# Patient Record
Sex: Female | Born: 1935 | Hispanic: No | Smoking: Never smoker
Health system: Southern US, Community
[De-identification: ages and names within clinical notes are randomized; demographics above are authoritative.]

## PROBLEM LIST (undated history)

## (undated) DIAGNOSIS — I10 Essential (primary) hypertension: Secondary | ICD-10-CM

## (undated) DIAGNOSIS — L0390 Cellulitis, unspecified: Secondary | ICD-10-CM

## (undated) DIAGNOSIS — R1310 Dysphagia, unspecified: Secondary | ICD-10-CM

## (undated) DIAGNOSIS — G9340 Encephalopathy, unspecified: Secondary | ICD-10-CM

## (undated) DIAGNOSIS — F0280 Dementia in other diseases classified elsewhere without behavioral disturbance: Secondary | ICD-10-CM

## (undated) DIAGNOSIS — D649 Anemia, unspecified: Secondary | ICD-10-CM

## (undated) DIAGNOSIS — G309 Alzheimer's disease, unspecified: Secondary | ICD-10-CM

## (undated) DIAGNOSIS — L03116 Cellulitis of left lower limb: Secondary | ICD-10-CM

## (undated) DIAGNOSIS — I509 Heart failure, unspecified: Secondary | ICD-10-CM

## (undated) DIAGNOSIS — E785 Hyperlipidemia, unspecified: Secondary | ICD-10-CM

## (undated) DIAGNOSIS — E119 Type 2 diabetes mellitus without complications: Secondary | ICD-10-CM

## (undated) DIAGNOSIS — I70209 Unspecified atherosclerosis of native arteries of extremities, unspecified extremity: Secondary | ICD-10-CM

## (undated) DIAGNOSIS — E11649 Type 2 diabetes mellitus with hypoglycemia without coma: Secondary | ICD-10-CM

## (undated) DIAGNOSIS — N390 Urinary tract infection, site not specified: Secondary | ICD-10-CM

## (undated) DIAGNOSIS — L98499 Non-pressure chronic ulcer of skin of other sites with unspecified severity: Secondary | ICD-10-CM

## (undated) DIAGNOSIS — I2510 Atherosclerotic heart disease of native coronary artery without angina pectoris: Secondary | ICD-10-CM

## (undated) DIAGNOSIS — M75102 Unspecified rotator cuff tear or rupture of left shoulder, not specified as traumatic: Secondary | ICD-10-CM

## (undated) DIAGNOSIS — D539 Nutritional anemia, unspecified: Secondary | ICD-10-CM

## (undated) DIAGNOSIS — R569 Unspecified convulsions: Secondary | ICD-10-CM

## (undated) HISTORY — DX: Type 2 diabetes mellitus with hypoglycemia without coma: E11.649

## (undated) HISTORY — DX: Unspecified rotator cuff tear or rupture of left shoulder, not specified as traumatic: M75.102

## (undated) HISTORY — DX: Cellulitis, unspecified: L03.90

## (undated) HISTORY — DX: Unspecified convulsions: R56.9

## (undated) HISTORY — DX: Unspecified atherosclerosis of native arteries of extremities, unspecified extremity: I70.209

## (undated) HISTORY — DX: Non-pressure chronic ulcer of skin of other sites with unspecified severity: L98.499

## (undated) HISTORY — PX: PACEMAKER IMPLANT: EP1218

## (undated) HISTORY — DX: Cellulitis of left lower limb: L03.116

## (undated) HISTORY — DX: Essential (primary) hypertension: I10

## (undated) HISTORY — DX: Alzheimer's disease, unspecified: G30.9

## (undated) HISTORY — DX: Type 2 diabetes mellitus without complications: E11.9

## (undated) HISTORY — DX: Anemia, unspecified: D64.9

## (undated) HISTORY — DX: Hyperlipidemia, unspecified: E78.5

## (undated) HISTORY — DX: Nutritional anemia, unspecified: D53.9

## (undated) HISTORY — DX: Urinary tract infection, site not specified: N39.0

## (undated) HISTORY — DX: Atherosclerotic heart disease of native coronary artery without angina pectoris: I25.10

## (undated) HISTORY — PX: BELOW KNEE LEG AMPUTATION: SUR23

## (undated) HISTORY — DX: Dementia in other diseases classified elsewhere without behavioral disturbance: F02.80

## (undated) HISTORY — DX: Encephalopathy, unspecified: G93.40

---

## 2006-04-22 ENCOUNTER — Inpatient Hospital Stay (HOSPITAL_COMMUNITY): Admission: RE | Admit: 2006-04-22 | Discharge: 2006-04-25 | Payer: Self-pay | Admitting: Orthopedic Surgery

## 2006-04-23 ENCOUNTER — Ambulatory Visit: Payer: Self-pay | Admitting: Physical Medicine & Rehabilitation

## 2014-01-04 DIAGNOSIS — I10 Essential (primary) hypertension: Secondary | ICD-10-CM

## 2014-01-04 DIAGNOSIS — I2510 Atherosclerotic heart disease of native coronary artery without angina pectoris: Secondary | ICD-10-CM

## 2014-01-04 HISTORY — DX: Essential (primary) hypertension: I10

## 2014-01-04 HISTORY — DX: Atherosclerotic heart disease of native coronary artery without angina pectoris: I25.10

## 2014-01-27 DIAGNOSIS — E119 Type 2 diabetes mellitus without complications: Secondary | ICD-10-CM

## 2014-01-27 HISTORY — DX: Type 2 diabetes mellitus without complications: E11.9

## 2014-01-28 DIAGNOSIS — D649 Anemia, unspecified: Secondary | ICD-10-CM

## 2014-01-28 HISTORY — DX: Anemia, unspecified: D64.9

## 2016-09-25 DIAGNOSIS — N390 Urinary tract infection, site not specified: Secondary | ICD-10-CM

## 2016-09-25 HISTORY — DX: Urinary tract infection, site not specified: N39.0

## 2016-09-30 DIAGNOSIS — R569 Unspecified convulsions: Secondary | ICD-10-CM

## 2016-09-30 DIAGNOSIS — G9340 Encephalopathy, unspecified: Secondary | ICD-10-CM

## 2016-09-30 HISTORY — DX: Unspecified convulsions: R56.9

## 2016-09-30 HISTORY — DX: Encephalopathy, unspecified: G93.40

## 2016-10-02 LAB — CBC AND DIFFERENTIAL
HCT: 28 % — AB (ref 33–39)
Hemoglobin: 9.2 g/dL — AB (ref 11.0–13.0)

## 2016-10-02 LAB — BASIC METABOLIC PANEL
BUN: 26 mg/dL — AB (ref 5–18)
CREATININE: 0.5 mg/dL (ref 0.5–1.1)
POTASSIUM: 4.8 mmol/L (ref 3.4–5.3)
Sodium: 146 mmol/L (ref 137–147)

## 2016-10-02 LAB — HEPATIC FUNCTION PANEL
ALT: 11 U/L (ref 3–30)
AST: 13 U/L (ref 2–40)
Alkaline Phosphatase: 73 U/L (ref 25–125)

## 2016-10-03 LAB — HEPATIC FUNCTION PANEL
ALT: 12 U/L (ref 3–30)
AST: 11 U/L (ref 2–40)
Alkaline Phosphatase: 70 U/L (ref 25–125)

## 2016-10-03 LAB — BASIC METABOLIC PANEL
BUN: 29 mg/dL — AB (ref 5–18)
CREATININE: 0.4 mg/dL — AB (ref 0.5–1.1)
POTASSIUM: 4.6 mmol/L (ref 3.4–5.3)
Sodium: 145 mmol/L (ref 137–147)

## 2016-10-03 LAB — CBC AND DIFFERENTIAL
HCT: 28 % — AB (ref 33–39)
Hemoglobin: 9.1 g/dL — AB (ref 11.0–13.0)
PLATELETS: 144 10*3/uL — AB (ref 150–399)
WBC: 7.8 10*3/mL (ref 5.0–15.0)

## 2016-10-04 LAB — CBC AND DIFFERENTIAL
HCT: 26 % — AB (ref 33–39)
Hemoglobin: 8.2 g/dL — AB (ref 11.0–13.0)
PLATELETS: 139 10*3/uL — AB (ref 150–399)
WBC: 6.8 10*3/mL (ref 5.0–15.0)

## 2016-10-04 LAB — BASIC METABOLIC PANEL
BUN: 24 mg/dL — AB (ref 5–18)
CREATININE: 0.4 mg/dL — AB (ref 0.5–1.1)
POTASSIUM: 4.4 mmol/L (ref 3.4–5.3)
Sodium: 143 mmol/L (ref 137–147)

## 2016-10-05 LAB — BASIC METABOLIC PANEL
BUN: 21 mg/dL — AB (ref 5–18)
CREATININE: 0.5 mg/dL (ref 0.5–1.1)
POTASSIUM: 4 mmol/L (ref 3.4–5.3)
SODIUM: 140 mmol/L (ref 137–147)

## 2016-10-05 LAB — CBC AND DIFFERENTIAL
HCT: 26 % — AB (ref 33–39)
Hemoglobin: 8.3 g/dL — AB (ref 11.0–13.0)
Platelets: 143 10*3/uL — AB (ref 150–399)
WBC: 7.3 10^3/mL (ref 5.0–15.0)

## 2016-10-07 DIAGNOSIS — F0280 Dementia in other diseases classified elsewhere without behavioral disturbance: Secondary | ICD-10-CM

## 2016-10-07 HISTORY — DX: Dementia in other diseases classified elsewhere, unspecified severity, without behavioral disturbance, psychotic disturbance, mood disturbance, and anxiety: F02.80

## 2016-10-07 LAB — CBC AND DIFFERENTIAL
HEMATOCRIT: 26 % — AB (ref 33–39)
HEMOGLOBIN: 8.3 g/dL — AB (ref 11.0–13.0)
Platelets: 140 10*3/uL — AB (ref 150–399)
WBC: 6.6 10^3/mL (ref 5.0–15.0)

## 2016-10-07 LAB — BASIC METABOLIC PANEL
BUN: 24 mg/dL — AB (ref 5–18)
CREATININE: 0.6 mg/dL (ref 0.5–1.1)
Potassium: 4.2 mmol/L (ref 3.4–5.3)
Sodium: 144 mmol/L (ref 137–147)

## 2016-10-07 LAB — HEPATIC FUNCTION PANEL
ALT: 20 U/L (ref 3–30)
AST: 16 U/L (ref 2–40)
Alkaline Phosphatase: 91 U/L (ref 25–125)

## 2016-10-09 ENCOUNTER — Encounter: Payer: Self-pay | Admitting: Internal Medicine

## 2016-10-09 ENCOUNTER — Non-Acute Institutional Stay (SKILLED_NURSING_FACILITY): Payer: Medicare Other | Admitting: Internal Medicine

## 2016-10-09 DIAGNOSIS — F32A Depression, unspecified: Secondary | ICD-10-CM | POA: Insufficient documentation

## 2016-10-09 DIAGNOSIS — G309 Alzheimer's disease, unspecified: Secondary | ICD-10-CM

## 2016-10-09 DIAGNOSIS — R5381 Other malaise: Secondary | ICD-10-CM

## 2016-10-09 DIAGNOSIS — J189 Pneumonia, unspecified organism: Secondary | ICD-10-CM | POA: Diagnosis not present

## 2016-10-09 DIAGNOSIS — G40409 Other generalized epilepsy and epileptic syndromes, not intractable, without status epilepticus: Secondary | ICD-10-CM | POA: Diagnosis not present

## 2016-10-09 DIAGNOSIS — D649 Anemia, unspecified: Secondary | ICD-10-CM

## 2016-10-09 DIAGNOSIS — F329 Major depressive disorder, single episode, unspecified: Secondary | ICD-10-CM | POA: Diagnosis not present

## 2016-10-09 DIAGNOSIS — I2510 Atherosclerotic heart disease of native coronary artery without angina pectoris: Secondary | ICD-10-CM | POA: Insufficient documentation

## 2016-10-09 DIAGNOSIS — I739 Peripheral vascular disease, unspecified: Secondary | ICD-10-CM

## 2016-10-09 DIAGNOSIS — R1310 Dysphagia, unspecified: Secondary | ICD-10-CM | POA: Insufficient documentation

## 2016-10-09 DIAGNOSIS — I10 Essential (primary) hypertension: Secondary | ICD-10-CM | POA: Diagnosis not present

## 2016-10-09 DIAGNOSIS — F0280 Dementia in other diseases classified elsewhere without behavioral disturbance: Secondary | ICD-10-CM | POA: Insufficient documentation

## 2016-10-09 DIAGNOSIS — E440 Moderate protein-calorie malnutrition: Secondary | ICD-10-CM | POA: Diagnosis not present

## 2016-10-09 DIAGNOSIS — E1151 Type 2 diabetes mellitus with diabetic peripheral angiopathy without gangrene: Secondary | ICD-10-CM

## 2016-10-09 DIAGNOSIS — E039 Hypothyroidism, unspecified: Secondary | ICD-10-CM | POA: Diagnosis not present

## 2016-10-09 DIAGNOSIS — R1312 Dysphagia, oropharyngeal phase: Secondary | ICD-10-CM | POA: Diagnosis not present

## 2016-10-09 DIAGNOSIS — F0390 Unspecified dementia without behavioral disturbance: Secondary | ICD-10-CM

## 2016-10-09 DIAGNOSIS — E785 Hyperlipidemia, unspecified: Secondary | ICD-10-CM

## 2016-10-09 NOTE — Progress Notes (Signed)
LOCATION: [place]  PCP: No primary care provider on file.   Code Status: Full Code  Goals of care: Advanced Directive information No flowsheet data found.     No emergency contact information on file.   Allergies  Allergen Reactions  . Codeine Nausea Only  . Dilaudid [Hydromorphone]   . Hydrocodone     Hallucinations  . Keflex [Cephalexin] Diarrhea  . Tylenol [Acetaminophen]     Chief Complaint  Patient presents with  . New Admit To SNF    New Admission Visit      HPI:  Patient is a 83 y.o. female seen today for short term rehabilitation post hospital admission from 09/25/2016-10/08/2016 with acute change of mental status. She was noted to have tonic-clonic seizure and required IV benzodiazepine. She also required intubation to help protect her airway. She required loading dose of Keppra followed by oral Keppra dosing. CT of her head and lumbar puncture with CSF study was unremarkable. EEG was abnormal. There was concern for ventilator-dependent respiratory failure versus pneumonia and she was placed on IV antibiotic. She required 2 unit of packed red blood cell transfusion for her anemia. Urine study was negative for infection. She was followed by speech therapy for her dysphagia. She has medical history of dementia, diabetes mellitus, hypertension, coronary artery disease among others. She was residing at a skilled facility prior to this hospitalization. She is seen in her room today with her sister at bedside.  Review of Systems: Limited HPI/ROS Constitutional: Negative for fever HENT: Negative for headache, nasal discharge Respiratory: Negative for cough, shortness of breath Cardiovascular: Negative for chest pain  Gastrointestinal: Negative for nausea, vomiting, abdominal pain. Per sister had a good breakfast this am Genitourinary: Negative for dysuria Musculoskeletal: Negative for back pain, fall.  Skin: Negative for itching, rash.  Neurological: Negative  for dizziness.    Past Medical History:  Diagnosis Date  . Acute encephalopathy   . Alzheimer's dementia without behavioral disturbance   . Anemia   . Atherosclerotic PVD with ulceration ([place])   . CAD (coronary artery disease)   . Cellulitis   . Cellulitis of left leg   . Diabetes mellitus without complication ([place])   . Hyperlipidemia   . Hypertension   . Hypoglycemia associated with type 2 diabetes mellitus ([place])   . Macrocytic anemia   . Tear of left rotator cuff   . UTI (urinary tract infection)    History reviewed. No pertinent surgical history. Social History:   has no tobacco, alcohol, and drug history on file.  History reviewed. No pertinent family history.  Medications: Allergies as of 10/09/2016      Reactions   Codeine Nausea Only   Dilaudid [hydromorphone]    Hydrocodone    Hallucinations   Keflex [cephalexin] Diarrhea   Tylenol [acetaminophen]       Medication List       Accurate as of 10/09/16 12:31 PM. Always use your most recent med list.          carvedilol 12.5 MG tablet Commonly known as:  COREG Take 12.5 mg by mouth 2 (two) times daily with a meal.   cholecalciferol 1000 units tablet Commonly known as:  VITAMIN D Take 2,000 Units by mouth daily.   clopidogrel 75 MG tablet Commonly known as:  PLAVIX Take 75 mg by mouth daily.   glipiZIDE 5 MG tablet Commonly known as:  GLUCOTROL Take 5 mg by mouth 2 (two) times daily before a meal.  levETIRAcetam 500 MG tablet Commonly known as:  KEPPRA Take 500 mg by mouth 2 (two) times daily.   levothyroxine 100 MCG tablet Commonly known as:  SYNTHROID, LEVOTHROID Take 100 mcg by mouth daily before breakfast.   lisinopril 2.5 MG tablet Commonly known as:  PRINIVIL,ZESTRIL Take 2.5 mg by mouth daily.   sertraline 50 MG tablet Commonly known as:  ZOLOFT Take 50 mg by mouth daily.   simvastatin 10 MG tablet Commonly known as:  ZOCOR Take 10 mg by mouth at bedtime.        Immunizations:  There is no immunization history on file for this patient.   Physical Exam: Vitals:   10/09/16 1140  BP: (!) 121/52  Pulse: 75  Resp: 20  Temp: 99 F (37.2 C)  TempSrc: Oral  SpO2: 98%  Weight: 132 lb (59.9 kg)  Height: 5\' 4"  (1.626 m)   Body mass index is 22.66 kg/m.  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 No results for input(s): AMMONIA in the last 8760 hours. CBC:  Recent Labs  10/04/16 10/05/16 10/07/16  WBC 6.8 7.3 6.6  HGB 8.2* 8.3* 8.3*  HCT 26* 26* 26*  PLT 139* 143* 140*    Radiological Exams: Xr Chest Ap Portable 09/30/16 Impression. Supportable apparatus stable. Patchy bilateral infiltrate/edema.  FL Esophagus Modified Barium Swallow 10/04/16 Impression. There was penetration with the nectar and thin consistencies. There was aspiration with nectar and thin  consistencies.  Ct Head Wo IV Contrast 10/02/16 Impression. No acute intracranial abnormality.  US Venous Upper Extremity Bilateral 09/30/16 Impression. No evidence of deep venous thrombosis      Assessment/Plan  Physical deconditioning Will have her work with physical therapy and occupational therapy team to help with gait training and muscle strengthening exercises.fall precautions. Skin care. Encourage to be out of bed. If patient fails to make improvement, will consider palliative care consult.  Tonic clonic seizure Remains seizure free. Continue keppra 500 mg bid and monitor. Make neurology follow up.  Pneumonia Has completed her antibiotic,remains high aspiration risk. Will need assistance with feeding. SLP consult. Encourage to use incentive spirometer  Protein calorie malnutrition Decline anticipated with her dementia. Monitor oral intake and weight. RD consult  Hypothyroidism Continue levothyroxine 100 g daily. Check thyroid level  Type 2 diabetes mellitus with vascular complications Continue glipizide 5 mg daily and lisinopril 2.5 mg daily. Check blood sugar reading and hemoglobin A1c.  Hypertension Monitor BP reading,  continue lisinopril 2.5 mg daily and coreg 12.5 mg bid check bmp  PVD s/p right BKA and has chronic skin changes to left leg. Provide skin care. Continue plavix 75 mg daily and simvastatin 10 mg daily.  Dementia Provide supportive care. Fall precautions. Pressure ulcer prophylaxis  Chronic depression Will get psych consult. Continue zoloft 50 mg daily  Hyperlipidemia Check lipid panel, continue statin  Anemia unspecified Monitor cbc, likely has component of chronic disease  Coronary artery disease Remains chest pain-free. Continue Coreg, Plavix and statin along with lisinopril.  Oropharyngeal dysphagia On dysphagia diet at present. Aspiration precautions to be taken. Assistance with feeding to be provided. SLP consult.    Goals of  care: short term rehabilitation, possible long term care   Labs/tests ordered: cbc, cmp, a1c, tsh, lipid panel, psych consult, SLP consult, RD consult  Family/ staff Communication: reviewed care plan with patient, her sister and nursing supervisor  I have spent greater than 50 minutes for this encounter which includes reviewing hospital records, addressing above mentioned concerns, reviewing care plan with patient, answering patient's and family's concerns and counseling her.    Blanchie Serve, MD Internal Medicine [facility] [address] [place], [place] [address] Cell Phone (Monday-Friday 8 am - 5 pm): [phone] On Call: [phone] and follow prompts after 5 pm and on weekends Office Phone: [phone] Office Fax: [phone]

## 2016-10-09 NOTE — Patient Instructions (Signed)
Opened in error

## 2016-10-09 NOTE — Progress Notes (Signed)
Opened in error

## 2016-10-10 LAB — BASIC METABOLIC PANEL
BUN: 22 mg/dL — AB (ref 4–21)
CREATININE: 0.5 mg/dL (ref 0.5–1.1)
GLUCOSE: 99 mg/dL
POTASSIUM: 3.8 mmol/L (ref 3.4–5.3)
Sodium: 146 mmol/L (ref 137–147)

## 2016-10-15 LAB — BASIC METABOLIC PANEL
BUN: 15 mg/dL (ref 4–21)
CREATININE: 0.6 mg/dL (ref 0.5–1.1)
Glucose: 65 mg/dL
POTASSIUM: 3.8 mmol/L (ref 3.4–5.3)
Sodium: 143 mmol/L (ref 137–147)

## 2016-10-15 LAB — CBC AND DIFFERENTIAL
HCT: 24 % — AB (ref 36–46)
Hemoglobin: 7.6 g/dL — AB (ref 12.0–16.0)
Platelets: 144 10*3/uL — AB (ref 150–399)
WBC: 6.1 10^3/mL

## 2016-10-15 LAB — LIPID PANEL
CHOLESTEROL: 105 mg/dL (ref 0–200)
HDL: 20 mg/dL — AB (ref 35–70)
LDL CALC: 70 mg/dL
LDL/HDL RATIO: 5.2
TRIGLYCERIDES: 75 mg/dL (ref 40–160)

## 2016-10-15 LAB — HEMOGLOBIN A1C: HEMOGLOBIN A1C: 6.2

## 2016-10-15 LAB — HEPATIC FUNCTION PANEL
ALT: 20 U/L (ref 7–35)
AST: 18 U/L (ref 13–35)
Alkaline Phosphatase: 90 U/L (ref 25–125)
BILIRUBIN, TOTAL: 0.6 mg/dL

## 2016-10-15 LAB — TSH: TSH: 1.7 u[IU]/mL (ref 0.41–5.90)

## 2016-10-21 ENCOUNTER — Encounter: Payer: Self-pay | Admitting: Adult Health

## 2016-10-21 NOTE — Progress Notes (Signed)
This encounter was created in error - please disregard.

## 2016-10-22 ENCOUNTER — Encounter: Payer: Self-pay | Admitting: Adult Health

## 2016-10-22 ENCOUNTER — Non-Acute Institutional Stay (SKILLED_NURSING_FACILITY): Payer: Medicare Other | Admitting: Adult Health

## 2016-10-22 DIAGNOSIS — I2510 Atherosclerotic heart disease of native coronary artery without angina pectoris: Secondary | ICD-10-CM

## 2016-10-22 DIAGNOSIS — I739 Peripheral vascular disease, unspecified: Secondary | ICD-10-CM

## 2016-10-22 DIAGNOSIS — G40409 Other generalized epilepsy and epileptic syndromes, not intractable, without status epilepticus: Secondary | ICD-10-CM | POA: Diagnosis not present

## 2016-10-22 DIAGNOSIS — D638 Anemia in other chronic diseases classified elsewhere: Secondary | ICD-10-CM

## 2016-10-22 LAB — CBC AND DIFFERENTIAL
HEMATOCRIT: 23 % — AB (ref 36–46)
Hemoglobin: 7.4 g/dL — AB (ref 12.0–16.0)
NEUTROS ABS: 4 /uL
Platelets: 123 10*3/uL — AB (ref 150–399)
WBC: 6.8 10^3/mL

## 2016-10-22 NOTE — Progress Notes (Signed)
DATE:  10/22/2016   MRN:  [id number]  BIRTHDAY: 09-21-1933  Facility:  [facility] Home Location:  [facility] Room Number: 1101-B  LEVEL OF CARE:  SNF (31)  Contact Information    Name Relation Home Work [place]  [phone]         Code Status History    This patient does not have a recorded code status. Please follow your organizational policy for patients in this situation.       Chief Complaint  Patient presents with  . Acute Visit    Anemia    HISTORY OF PRESENT ILLNESS:  This is an 82-YO female seen for an acute visit due to anemia. Patient complains of headache at times. Latest hgb 7.4.   She has been admitted to [facility] on 10/08/16 post hospitalization with admission dates  09/25/16 to 10/08/16 with acute change in .mental status. She was noted to have tonic-clonic seizure and required IV benzodiazepine. She required intubation to protect her airway. She was started on Keppra. CT of head and lumbar puncture with CSF study was unremarkable. EEG was abnormal. She was placed on antibiotic for concern of ventilator dependent respiratory failure versus PNA. She was transfused 2 units packed RBC for anemia.  She has PMH of dementgia, diabetes mellitus, hypertension and CAD.    PAST MEDICAL HISTORY:  Past Medical History:  Diagnosis Date  . Acute encephalopathy 09/30/2016  . Alzheimer's dementia without behavioral disturbance 10/07/2016  . Anemia 01/28/2014  . CAD (coronary artery disease) 01/04/2014  . DM (diabetes mellitus) ([place]) 01/27/2014  . HTN (hypertension) 01/04/2014  . Seizures ([place]) 09/30/2016  . UTI (urinary tract infection) 09/25/2016     CURRENT MEDICATIONS: Reviewed  Patient's Medications  New Prescriptions   No medications on file  Previous Medications   CARVEDILOL (COREG) 12.5 MG TABLET    Take 12.5 mg by mouth 2 (two) times daily with a meal.   CHOLECALCIFEROL (VITAMIN D3)  2000 UNITS CAPSULE    Take 2,000 Units by mouth daily.   CLOPIDOGREL (PLAVIX) 75 MG TABLET    Take 75 mg by mouth daily.   FERROUS SULFATE 325 (65 FE) MG TABLET    Take 325 mg by mouth 2 (two) times daily with a meal.   LEVETIRACETAM (KEPPRA) 500 MG TABLET    Take 500 mg by mouth 2 (two) times daily.   LEVOTHYROXINE (SYNTHROID, LEVOTHROID) 100 MCG TABLET    Take 100 mcg by mouth daily before breakfast.   LISINOPRIL (PRINIVIL,ZESTRIL) 2.5 MG TABLET    Take 2.5 mg by mouth daily.   NUTRITIONAL SUPPLEMENTS (NUTRITIONAL SUPPLEMENT PO)    Take 1 each by mouth 2 (two) times daily. Magic Cup   OMEPRAZOLE (PRILOSEC) 20 MG CAPSULE    Take 20 mg by mouth daily.   POLYETHYLENE GLYCOL (MIRALAX / GLYCOLAX) PACKET    Take 17 g by mouth daily.   SENNOSIDES-DOCUSATE SODIUM (SENOKOT-S) 8.6-50 MG TABLET    Take 2 tablets by mouth 2 (two) times daily.   SERTRALINE (ZOLOFT) 50 MG TABLET    Take 50 mg by mouth daily.   SIMVASTATIN (ZOCOR) 10 MG TABLET    Take 10 mg by mouth daily.  Modified Medications   No medications on file  Discontinued Medications   No medications on file     Allergies  Allergen Reactions  . Acetaminophen   . Codeine   . Dilaudid [Hydromorphone Hcl]   .  Hydrocodone   . Keflex [Cephalexin]      REVIEW OF SYSTEMS:  Unable to obtain due to dementia    PHYSICAL EXAMINATION  GENERAL APPEARANCE: Well nourished. In no acute distress.  SKIN:  Skin is warm and dry.  HEAD: Normal in size and contour. No evidence of trauma EYES: Lids open and close normally. No blepharitis, entropion or ectropion. PERRL. Conjunctivae are clear and sclerae are white. Lenses are without opacity EARS: Pinnae are normal. Patient hears normal voice tunes of the examiner MOUTH and THROAT: Lips are without lesions. Oral mucosa is moist and without lesions. Tongue is normal in shape, size, and color and without lesions NECK: supple, trachea midline, no neck masses, no thyroid tenderness, no  thyromegaly LYMPHATICS: no LAN in the neck, no supraclavicular LAN RESPIRATORY: breathing is even & unlabored, BS CTAB CARDIAC: RRR, no murmur,no extra heart sounds, no edema GI: abdomen soft, normal BS, no masses, no tenderness, no hepatomegaly, no splenomegaly EXTREMITIES:  Able to move X 4 extremities, Right BKA stump with dressing PSYCHIATRIC: Alert to self only. Affect and behavior are appropriate   LABS/RADIOLOGY: Labs reviewed: Basic Metabolic Panel:  Recent Labs  10/15/16  NA 143  K 3.8  BUN 15  CREATININE 0.6   Liver Function Tests:  Recent Labs  10/15/16  AST 18  ALT 20  ALKPHOS 90   CBC:  Recent Labs  10/22/16  WBC 6.8  NEUTROABS 4  HGB 7.4*  HCT 23*  PLT 123*   Lipid Panel:  Recent Labs  10/15/16  HDL 20*    ASSESSMENT/PLAN:  Anemia of chronic disease - schedule for type and cross then transfuse 1 unit PRBC, check for stool occult blood X 3, start Prilosec 20 mg PO Q D; check VS Q shift X 1 week, B12 and folate level Lab Results  Component Value Date   HGB 7.4 (A) 10/22/2016   CAD - no chest pain; continue Plavix 75 mg 1 tab PO Q D and Zocor 10 mg Q HS  PVD - S/P right BKA, continue Plavix 75 mg Q D and Zocor 10 mg Q HS  Tonic-clonic Seizure - continue Keppra 500 mg 1 tab PO BID; fall precautions      Duyen Beckom C. [place] - NP    [employer] [phone]

## 2016-11-01 ENCOUNTER — Encounter: Payer: Self-pay | Admitting: Adult Health

## 2016-11-01 ENCOUNTER — Non-Acute Institutional Stay (SKILLED_NURSING_FACILITY): Payer: Medicare Other | Admitting: Adult Health

## 2016-11-01 DIAGNOSIS — I10 Essential (primary) hypertension: Secondary | ICD-10-CM | POA: Diagnosis not present

## 2016-11-01 DIAGNOSIS — I739 Peripheral vascular disease, unspecified: Secondary | ICD-10-CM

## 2016-11-01 DIAGNOSIS — R531 Weakness: Secondary | ICD-10-CM | POA: Diagnosis not present

## 2016-11-01 DIAGNOSIS — F329 Major depressive disorder, single episode, unspecified: Secondary | ICD-10-CM

## 2016-11-01 DIAGNOSIS — E785 Hyperlipidemia, unspecified: Secondary | ICD-10-CM | POA: Diagnosis not present

## 2016-11-01 DIAGNOSIS — K5901 Slow transit constipation: Secondary | ICD-10-CM

## 2016-11-01 DIAGNOSIS — I2510 Atherosclerotic heart disease of native coronary artery without angina pectoris: Secondary | ICD-10-CM

## 2016-11-01 DIAGNOSIS — F0390 Unspecified dementia without behavioral disturbance: Secondary | ICD-10-CM

## 2016-11-01 DIAGNOSIS — D638 Anemia in other chronic diseases classified elsewhere: Secondary | ICD-10-CM

## 2016-11-01 DIAGNOSIS — F32A Depression, unspecified: Secondary | ICD-10-CM

## 2016-11-01 DIAGNOSIS — G40409 Other generalized epilepsy and epileptic syndromes, not intractable, without status epilepticus: Secondary | ICD-10-CM | POA: Diagnosis not present

## 2016-11-01 DIAGNOSIS — E039 Hypothyroidism, unspecified: Secondary | ICD-10-CM | POA: Diagnosis not present

## 2016-11-01 NOTE — Progress Notes (Signed)
Patient ID: Jean Hodges, female   DOB: 1933/10/03, 82 y.o.   MRN: [id number]    DATE:  11/01/2016  MRN:  [id number]  BIRTHDAY: 06-Oct-1933  Facility:  Nursing Home Location:  [place] Room Number: 1101-B  LEVEL OF CARE:  SNF (31)  Contact Information    Name Relation Home Work [place]  [phone]         Code Status History    This patient does not have a recorded code status. Please follow your organizational policy for patients in this situation.       Chief Complaint  Patient presents with  . Medical Management of Chronic Issues    HISTORY OF PRESENT ILLNESS:  This is an 82-YO female seen for a routine visit. She is being scheduled for blood transfusion of 1 unit packed RBC for anemia. She was recently started on FeSO4 325 mg BID for anemia and Senna and Miralax for constipation. Glucotrol was discontinued with hgbA1c 6.2 . She was recently discharged from PT services.  She has been admitted to [facility] on 10/08/16 post hospitalization with admission dates  09/25/16 to 10/08/16 with acute change in .mental status. She was noted to have tonic-clonic seizure and required IV benzodiazepine. She required intubation to protect her airway. She was started on Keppra. CT of head and lumbar puncture with CSF study was unremarkable. EEG was abnormal. She was placed on antibiotic for concern of ventilator dependent respiratory failure versus PNA. She was transfused 2 units packed RBC for anemia.  She has PMH of dementgia, diabetes mellitus, hypertension and CAD.    PAST MEDICAL HISTORY:  Past Medical History:  Diagnosis Date  . Acute encephalopathy   . Acute encephalopathy 09/30/2016  . Alzheimer's dementia without behavioral disturbance   . Alzheimer's dementia without behavioral disturbance 10/07/2016  . Anemia   . Anemia 01/28/2014  . Atherosclerotic PVD with ulceration ([place])   . CAD (coronary artery  disease)   . CAD (coronary artery disease) 01/04/2014  . Cellulitis   . Cellulitis of left leg   . Diabetes mellitus without complication ([place])   . DM (diabetes mellitus) ([place]) 01/27/2014  . HTN (hypertension) 01/04/2014  . Hyperlipidemia   . Hypertension   . Hypoglycemia associated with type 2 diabetes mellitus ([place])   . Macrocytic anemia   . Seizures ([place]) 09/30/2016  . Tear of left rotator cuff   . UTI (urinary tract infection)   . UTI (urinary tract infection) 09/25/2016     CURRENT MEDICATIONS: Reviewed  Patient's Medications  New Prescriptions   No medications on file  Previous Medications   CARVEDILOL (COREG) 12.5 MG TABLET    Take 12.5 mg by mouth 2 (two) times daily with a meal.   CHOLECALCIFEROL (VITAMIN D) 1000 UNITS TABLET    Take 2,000 Units by mouth daily.   CLOPIDOGREL (PLAVIX) 75 MG TABLET    Take 75 mg by mouth daily.    FERROUS SULFATE 325 (65 FE) MG TABLET    Take 325 mg by mouth 2 (two) times daily with a meal.   LEVETIRACETAM (KEPPRA) 500 MG TABLET    Take 500 mg by mouth 2 (two) times daily.   LEVOTHYROXINE (SYNTHROID, LEVOTHROID) 100 MCG TABLET    Take 100 mcg by mouth daily before breakfast.   LISINOPRIL (PRINIVIL,ZESTRIL) 2.5 MG TABLET    Take 2.5 mg by mouth daily.   NUTRITIONAL SUPPLEMENTS (NUTRITIONAL SUPPLEMENT PO)  Take 1 each by mouth 2 (two) times daily. Magic Cup   OMEPRAZOLE (PRILOSEC) 20 MG CAPSULE    Take 20 mg by mouth daily.   POLYETHYLENE GLYCOL (MIRALAX / GLYCOLAX) PACKET    Take 17 g by mouth daily.   SENNOSIDES-DOCUSATE SODIUM (SENOKOT-S) 8.6-50 MG TABLET    Take 2 tablets by mouth 2 (two) times daily.   SERTRALINE (ZOLOFT) 100 MG TABLET    Take 100 mg by mouth daily.   SIMVASTATIN (ZOCOR) 10 MG TABLET    Take 10 mg by mouth daily.  Modified Medications   No medications on file  Discontinued Medications   CARVEDILOL (COREG) 12.5 MG TABLET    Take 12.5 mg by mouth 2 (two) times daily with a meal.   CHOLECALCIFEROL (VITAMIN D3) 2000  UNITS CAPSULE    Take 2,000 Units by mouth daily.   CLOPIDOGREL (PLAVIX) 75 MG TABLET    Take 75 mg by mouth daily.   GLIPIZIDE (GLUCOTROL) 5 MG TABLET    Take 5 mg by mouth 2 (two) times daily before a meal.   LEVETIRACETAM (KEPPRA) 500 MG TABLET    Take 500 mg by mouth 2 (two) times daily.   LEVOTHYROXINE (SYNTHROID, LEVOTHROID) 100 MCG TABLET    Take 100 mcg by mouth daily before breakfast.   LISINOPRIL (PRINIVIL,ZESTRIL) 2.5 MG TABLET    Take 2.5 mg by mouth daily.   SERTRALINE (ZOLOFT) 50 MG TABLET    Take 50 mg by mouth daily.   SERTRALINE (ZOLOFT) 50 MG TABLET    Take 50 mg by mouth daily.   SIMVASTATIN (ZOCOR) 10 MG TABLET    Take 10 mg by mouth at bedtime.     Allergies  Allergen Reactions  . Acetaminophen   . Codeine Nausea Only  . Dilaudid [Hydromorphone]   . Hydrocodone     Hallucinations  . Keflex [Cephalexin] Diarrhea     REVIEW OF SYSTEMS:  Unable to obtain due to dementia   PHYSICAL EXAMINATION  GENERAL APPEARANCE: Well nourished. In no acute distress.  SKIN:  Right forearm with skin tears X 2 .  HEAD: Normal in size and contour. No evidence of trauma EYES: Lids open and close normally. No blepharitis, entropion or ectropion. PERRL. Conjunctivae are clear and sclerae are white. Lenses are without opacity EARS: Pinnae are normal. Patient hears normal voice tunes of the examiner MOUTH and THROAT: Lips are without lesions. Oral mucosa is moist and without lesions. Tongue is normal in shape, size, and color and without lesions NECK: supple, trachea midline, no neck masses, no thyroid tenderness, no thyromegaly LYMPHATICS: no LAN in the neck, no supraclavicular LAN RESPIRATORY: breathing is even & unlabored, BS CTAB CARDIAC: RRR, no murmur,no extra heart sounds, no edema GI: abdomen soft, normal BS, no masses, no tenderness, no hepatomegaly, no splenomegaly EXTREMITIES:  Able to move X 4 extremities, Right BKA stump with dressing PSYCHIATRIC: Alert to self only.  Affect and behavior are appropriate   LABS/RADIOLOGY: Labs reviewed: Basic Metabolic Panel:  Recent Labs  10/07/16 10/10/16 10/15/16  NA 144 146 143  K 4.2 3.8 3.8  BUN 24* 22* 15  CREATININE 0.6 0.5 0.6   Liver Function Tests:  Recent Labs  10/03/16 10/07/16 10/15/16  AST 11 16 18   ALT 12 20 20   ALKPHOS 70 91 90   CBC:  Recent Labs  10/07/16 10/15/16 10/22/16  WBC 6.6 6.1 6.8  NEUTROABS  --   --  4  HGB 8.3* 7.6* 7.4*  HCT 26* 24* 23*  PLT 140* 144* 123*   Lipid Panel:  Recent Labs  10/15/16  HDL 20*    ASSESSMENT/PLAN:  Generalized weakness - continue rehabilitation with OT for strengthening exercises; fall precautions  PVD - S/P right BKA, continue Plavix 75 mg Q D and Zocor 10 mg Q HS  Anemia of chronic disease - for blood transfusion of 1 unit PRBC, continue FeSO4 325 mg 1 tab PO BID Lab Results  Component Value Date   HGB 7.4 (A) 10/22/2016   CAD - no chest pain; continue Plavix 75 mg 1 tab PO Q D and Zocor 10 mg Q HS  Tonic-clonic Seizure - continue Keppra 500 mg 1 tab PO BID; fall precautions  Constipation - continue Miralax 17 gm PO Q D  Hypothyroidism - continue Synthroid 100 mcg 1 tab PO Q D Lab Results  Component Value Date   TSH 1.70 10/15/2016   Hypertension - well-controlled; continue Lisinopril 2.5 mg 1 tab PO Q D and Coreg 12.5 mg 1 tab PO BID  Chronic Depression - continue Zoloft 100 mg 1 tab PO Q D  Hyperlipidemia - continue Zoloft 100 mg 1 tab PO Q D  Dementia without behavioral disturbance - continue supportive care, fall precautions    Goals of care:  Long-term care    Brynden Thune C. [place] - NP    [employer] [phone]

## 2016-11-26 ENCOUNTER — Non-Acute Institutional Stay (SKILLED_NURSING_FACILITY): Payer: Medicare Other | Admitting: Internal Medicine

## 2016-11-26 DIAGNOSIS — G40409 Other generalized epilepsy and epileptic syndromes, not intractable, without status epilepticus: Secondary | ICD-10-CM | POA: Diagnosis not present

## 2016-11-26 DIAGNOSIS — E611 Iron deficiency: Secondary | ICD-10-CM

## 2016-11-26 DIAGNOSIS — F329 Major depressive disorder, single episode, unspecified: Secondary | ICD-10-CM

## 2016-11-26 DIAGNOSIS — I739 Peripheral vascular disease, unspecified: Secondary | ICD-10-CM | POA: Diagnosis not present

## 2016-11-26 DIAGNOSIS — F32A Depression, unspecified: Secondary | ICD-10-CM

## 2016-11-26 DIAGNOSIS — E039 Hypothyroidism, unspecified: Secondary | ICD-10-CM

## 2016-11-26 DIAGNOSIS — I10 Essential (primary) hypertension: Secondary | ICD-10-CM

## 2016-11-26 DIAGNOSIS — F0390 Unspecified dementia without behavioral disturbance: Secondary | ICD-10-CM | POA: Diagnosis not present

## 2016-11-26 DIAGNOSIS — I2510 Atherosclerotic heart disease of native coronary artery without angina pectoris: Secondary | ICD-10-CM | POA: Diagnosis not present

## 2016-11-26 DIAGNOSIS — E1151 Type 2 diabetes mellitus with diabetic peripheral angiopathy without gangrene: Secondary | ICD-10-CM | POA: Diagnosis not present

## 2016-11-26 DIAGNOSIS — E785 Hyperlipidemia, unspecified: Secondary | ICD-10-CM | POA: Diagnosis not present

## 2016-11-26 DIAGNOSIS — K219 Gastro-esophageal reflux disease without esophagitis: Secondary | ICD-10-CM | POA: Diagnosis not present

## 2016-11-26 NOTE — Progress Notes (Signed)
LOCATION: [facility]  PCP: No primary care provider on file.   Code Status: Full Code  Goals of care: Advanced Directive information No flowsheet data found.     Extended Emergency Contact Information Primary Emergency Contact: Amirault,RICHARD Address: [address] [place],  [place] Phone: [phone] Relation: None   Allergies  Allergen Reactions  . Acetaminophen   . Codeine Nausea Only  . Dilaudid [Hydromorphone]   . Hydrocodone     Hallucinations  . Keflex [Cephalexin] Diarrhea    Chief Complaint  Patient presents with  . Medical Management of Chronic Issues    Routine Visit      HPI:  Patient is a 83 y.o. female seen today for routine visit. She is pleasantly confused, can carry out conversation and has been at her baseline per nursing. She is seen in her room today. She denies any concerns.  She has medical history of dementia, diabetes mellitus, hypertension, coronary artery disease among others.   Review of Systems:  Constitutional: Negative for fever HENT: Negative for headache, nasal discharge Respiratory: Negative for cough, shortness of breath Cardiovascular: Negative for chest pain  Gastrointestinal: Negative for nausea, vomiting, abdominal pain. She had a bowel movement yesterday.  Genitourinary: Negative for dysuria Musculoskeletal: Negative for back pain Skin: Negative for itching, rash.  Neurological: Negative for dizziness.    Past Medical History:  Diagnosis Date  . Acute encephalopathy   . Acute encephalopathy 09/30/2016  . Alzheimer's dementia without behavioral disturbance   . Alzheimer's dementia without behavioral disturbance 10/07/2016  . Anemia   . Anemia 01/28/2014  . Atherosclerotic PVD with ulceration ([place])   . CAD (coronary artery disease)   . CAD (coronary artery disease) 01/04/2014  . Cellulitis   . Cellulitis of left leg   . Diabetes mellitus without complication ([place])   . DM (diabetes  mellitus) ([place]) 01/27/2014  . HTN (hypertension) 01/04/2014  . Hyperlipidemia   . Hypertension   . Hypoglycemia associated with type 2 diabetes mellitus ([place])   . Macrocytic anemia   . Seizures ([place]) 09/30/2016  . Tear of left rotator cuff   . UTI (urinary tract infection)   . UTI (urinary tract infection) 09/25/2016   No past surgical history on file. Social History:   has no tobacco, alcohol, and drug history on file.  No family history on file.  Medications: Allergies as of 11/26/2016      Reactions   Acetaminophen    Codeine Nausea Only   Dilaudid [hydromorphone]    Hydrocodone    Hallucinations   Keflex [cephalexin] Diarrhea      Medication List       Accurate as of 11/26/16  3:40 PM. Always use your most recent med list.          carvedilol 12.5 MG tablet Commonly known as:  COREG Take 12.5 mg by mouth 2 (two) times daily with a meal.   cholecalciferol 1000 units tablet Commonly known as:  VITAMIN D Take 2,000 Units by mouth daily.   clopidogrel 75 MG tablet Commonly known as:  PLAVIX Take 75 mg by mouth daily.   ferrous sulfate 325 (65 FE) MG tablet Take 325 mg by mouth 2 (two) times daily with a meal.   levETIRAcetam 500 MG tablet Commonly known as:  KEPPRA Take 500 mg by mouth 2 (two) times daily.   levothyroxine 100 MCG tablet Commonly known as:  SYNTHROID, LEVOTHROID Take  100 mcg by mouth daily before breakfast.   lisinopril 2.5 MG tablet Commonly known as:  PRINIVIL,ZESTRIL Take 2.5 mg by mouth daily.   NUTRITIONAL SUPPLEMENT PO Take 1 each by mouth 2 (two) times daily. Magic Cup   omeprazole 20 MG capsule Commonly known as:  PRILOSEC Take 20 mg by mouth daily.   polyethylene glycol packet Commonly known as:  MIRALAX / GLYCOLAX Take 17 g by mouth daily.   sennosides-docusate sodium 8.6-50 MG tablet Commonly known as:  SENOKOT-S Take 2 tablets by mouth 2 (two) times daily.   sertraline 100 MG tablet Commonly known as:   ZOLOFT Take 100 mg by mouth daily.   simvastatin 10 MG tablet Commonly known as:  ZOCOR Take 10 mg by mouth daily.       Immunizations: Immunization History  Administered Date(s) Administered  . PPD Test 10/08/2016     Physical Exam: Vitals:   11/26/16 1532  BP: (!) 112/50  Pulse: 81  Resp: 20  Temp: 97.9 F (36.6 C)  TempSrc: Oral  SpO2: 99%  Weight: 118 lb (53.5 kg)  Height: 5\' 6"  (1.676 m)   Body mass index is 19.05 kg/m.  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 No results for input(s): AMMONIA in the last 8760 hours. CBC:  Recent Labs  10/07/16 10/15/16 10/22/16  WBC 6.6 6.1 6.8  NEUTROABS  --   --  4  HGB 8.3* 7.6* 7.4*  HCT 26* 24* 23*  PLT 140* 144* 123*    Radiological Exams: Xr Chest Ap Portable 09/30/16 Impression. Supportable apparatus stable. Patchy bilateral infiltrate/edema.  FL Esophagus Modified Barium Swallow 10/04/16 Impression. There  was penetration with the nectar and thin consistencies. There was aspiration with nectar and thin consistencies.  Ct Head Wo IV Contrast 10/02/16 Impression. No acute intracranial abnormality.  US Venous Upper Extremity Bilateral 09/30/16 Impression. No evidence of deep venous thrombosis      Assessment/Plan  Iron def anemia Normal Z60 and folic acid. Normal MCV. Continue ferrous sulfate 325 mg bid. Check cbc  Tonic clonic seizure Remains seizure free. Continue keppra 500 mg bid and monitor.   gerd Continue prilosec 20 mg daily  Hypothyroidism Lab Results  Component Value Date   TSH 1.70 10/15/2016   Continue levothyroxine 100 g daily. Check thyroid level  Chronic depression Followed by pscyh and zoloft increased a month back from 50 mg daily to 100 mg daily, monitor mood  PVD s/p right BKA and has chronic skin changes to left  leg. Provide skin care. Continue plavix 75 mg daily and simvastatin 10 mg daily.  Dementia Provide supportive care. Fall precautions. Pressure ulcer prophylaxis  Chronic depression Will get psych consult. Continue zoloft 50 mg daily  Hyperlipidemia Lipid Panel     Component Value Date/Time   CHOL 105 10/15/2016   TRIG 75 10/15/2016   HDL 20 (A) 10/15/2016   LDLCALC 70 10/15/2016   LDL at goal. Continue simvastatin 10 mg qhs and monitor.  HTN Low BP reading on review with SBP 100-116 and DBP 42-67. Currently on lisinopril 2.5 mg daily and coreg 12.5 mg bid. Discontinue her lisinopril. Decrease coreg to 6.25 mg bid. Add holding parameter with coreg and check BP.   Coronary artery disease Remains chest pain-free. Continue Coreg with decreased dosing as above, Plavix and statin. D/c lisinopril as above.  Type 2 diabetes mellitus with vascular complications Lab Results  Component Value Date   HGBA1C 6.2 10/15/2016   a1c suggestive of controlled DM. Off oral hypoglycemics. D/c lisinopril with low BP     Blanchie Serve, MD Internal  Medicine [facility] [address] [place], [place] [address] Cell Phone (Monday-Friday 8 am - 5 pm): [phone] On Call: [phone] and follow prompts after 5 pm and on weekends Office Phone: [phone] Office Fax: [phone]

## 2016-11-27 ENCOUNTER — Encounter (HOSPITAL_COMMUNITY): Payer: Self-pay | Admitting: Emergency Medicine

## 2016-11-27 ENCOUNTER — Observation Stay (HOSPITAL_COMMUNITY)
Admission: EM | Admit: 2016-11-27 | Discharge: 2016-11-28 | Disposition: A | Discharge status: Skilled Nursing Facility | Payer: Medicare Other | Attending: Internal Medicine | Admitting: Internal Medicine

## 2016-11-27 DIAGNOSIS — I739 Peripheral vascular disease, unspecified: Secondary | ICD-10-CM | POA: Diagnosis present

## 2016-11-27 DIAGNOSIS — D649 Anemia, unspecified: Secondary | ICD-10-CM | POA: Diagnosis present

## 2016-11-27 DIAGNOSIS — Z79899 Other long term (current) drug therapy: Secondary | ICD-10-CM | POA: Insufficient documentation

## 2016-11-27 DIAGNOSIS — Z95810 Presence of automatic (implantable) cardiac defibrillator: Secondary | ICD-10-CM | POA: Diagnosis not present

## 2016-11-27 DIAGNOSIS — E1151 Type 2 diabetes mellitus with diabetic peripheral angiopathy without gangrene: Secondary | ICD-10-CM | POA: Diagnosis present

## 2016-11-27 DIAGNOSIS — I2510 Atherosclerotic heart disease of native coronary artery without angina pectoris: Secondary | ICD-10-CM | POA: Diagnosis not present

## 2016-11-27 DIAGNOSIS — Z7902 Long term (current) use of antithrombotics/antiplatelets: Secondary | ICD-10-CM | POA: Diagnosis not present

## 2016-11-27 DIAGNOSIS — R569 Unspecified convulsions: Secondary | ICD-10-CM | POA: Diagnosis not present

## 2016-11-27 DIAGNOSIS — Z89511 Acquired absence of right leg below knee: Secondary | ICD-10-CM | POA: Insufficient documentation

## 2016-11-27 DIAGNOSIS — D509 Iron deficiency anemia, unspecified: Secondary | ICD-10-CM | POA: Diagnosis not present

## 2016-11-27 DIAGNOSIS — F0280 Dementia in other diseases classified elsewhere without behavioral disturbance: Secondary | ICD-10-CM | POA: Diagnosis not present

## 2016-11-27 DIAGNOSIS — I429 Cardiomyopathy, unspecified: Secondary | ICD-10-CM | POA: Diagnosis not present

## 2016-11-27 DIAGNOSIS — G309 Alzheimer's disease, unspecified: Secondary | ICD-10-CM

## 2016-11-27 DIAGNOSIS — F329 Major depressive disorder, single episode, unspecified: Secondary | ICD-10-CM | POA: Diagnosis not present

## 2016-11-27 DIAGNOSIS — R718 Other abnormality of red blood cells: Secondary | ICD-10-CM | POA: Diagnosis present

## 2016-11-27 DIAGNOSIS — I10 Essential (primary) hypertension: Secondary | ICD-10-CM | POA: Diagnosis present

## 2016-11-27 DIAGNOSIS — E039 Hypothyroidism, unspecified: Secondary | ICD-10-CM | POA: Diagnosis not present

## 2016-11-27 DIAGNOSIS — G40409 Other generalized epilepsy and epileptic syndromes, not intractable, without status epilepticus: Secondary | ICD-10-CM | POA: Diagnosis present

## 2016-11-27 LAB — RETICULOCYTES
RBC.: 2.06 MIL/uL — AB (ref 3.87–5.11)
Retic Count, Absolute: 33 10*3/uL (ref 19.0–186.0)
Retic Ct Pct: 1.6 % (ref 0.4–3.1)

## 2016-11-27 LAB — CBC WITH DIFFERENTIAL/PLATELET
BASOS ABS: 0 10*3/uL (ref 0.0–0.1)
BASOS PCT: 0 %
EOS ABS: 0.2 10*3/uL (ref 0.0–0.7)
Eosinophils Relative: 3 %
HEMATOCRIT: 23.4 % — AB (ref 36.0–46.0)
Hemoglobin: 7.9 g/dL — ABNORMAL LOW (ref 12.0–15.0)
Lymphocytes Relative: 17 %
Lymphs Abs: 1.3 10*3/uL (ref 0.7–4.0)
MCH: 34.5 pg — ABNORMAL HIGH (ref 26.0–34.0)
MCHC: 33.8 g/dL (ref 30.0–36.0)
MCV: 102.2 fL — ABNORMAL HIGH (ref 78.0–100.0)
MONO ABS: 0.3 10*3/uL (ref 0.1–1.0)
Monocytes Relative: 5 %
NEUTROS ABS: 5.6 10*3/uL (ref 1.7–7.7)
Neutrophils Relative %: 75 %
PLATELETS: 161 10*3/uL (ref 150–400)
RBC: 2.29 MIL/uL — ABNORMAL LOW (ref 3.87–5.11)
RDW: 18.2 % — AB (ref 11.5–15.5)
WBC: 7.5 10*3/uL (ref 4.0–10.5)

## 2016-11-27 LAB — I-STAT CHEM 8, ED
BUN: 17 mg/dL (ref 6–20)
CALCIUM ION: 1.22 mmol/L (ref 1.15–1.40)
CHLORIDE: 102 mmol/L (ref 101–111)
Creatinine, Ser: 0.6 mg/dL (ref 0.44–1.00)
GLUCOSE: 144 mg/dL — AB (ref 65–99)
HCT: 19 % — ABNORMAL LOW (ref 36.0–46.0)
Hemoglobin: 6.5 g/dL — CL (ref 12.0–15.0)
POTASSIUM: 4.2 mmol/L (ref 3.5–5.1)
Sodium: 138 mmol/L (ref 135–145)
TCO2: 25 mmol/L (ref 0–100)

## 2016-11-27 LAB — BASIC METABOLIC PANEL
ANION GAP: 6 (ref 5–15)
BUN: 17 mg/dL (ref 6–20)
CALCIUM: 8.6 mg/dL — AB (ref 8.9–10.3)
CO2: 26 mmol/L (ref 22–32)
CREATININE: 0.69 mg/dL (ref 0.44–1.00)
Chloride: 104 mmol/L (ref 101–111)
GLUCOSE: 154 mg/dL — AB (ref 65–99)
Potassium: 4.2 mmol/L (ref 3.5–5.1)
Sodium: 136 mmol/L (ref 135–145)

## 2016-11-27 LAB — POC OCCULT BLOOD, ED: FECAL OCCULT BLD: NEGATIVE

## 2016-11-27 LAB — PREPARE RBC (CROSSMATCH)

## 2016-11-27 MED ORDER — POLYETHYLENE GLYCOL 3350 17 G PO PACK
17.0000 g | PACK | Freq: Every day | ORAL | Status: DC
Start: 2016-11-28 — End: 2016-11-28
  Administered 2016-11-28: 17 g via ORAL
  Filled 2016-11-27: qty 1

## 2016-11-27 MED ORDER — SERTRALINE HCL 100 MG PO TABS
100.0000 mg | ORAL_TABLET | Freq: Every day | ORAL | Status: DC
  Administered 2016-11-28: 100 mg via ORAL
  Filled 2016-11-27: qty 1

## 2016-11-27 MED ORDER — FERROUS SULFATE 325 (65 FE) MG PO TABS
325.0000 mg | ORAL_TABLET | Freq: Two times a day (BID) | ORAL | Status: DC
  Administered 2016-11-28 (×2): 325 mg via ORAL
  Filled 2016-11-27 (×2): qty 1

## 2016-11-27 MED ORDER — INSULIN ASPART 100 UNIT/ML ~~LOC~~ SOLN
0.0000 [IU] | Freq: Three times a day (TID) | SUBCUTANEOUS | Status: DC
  Administered 2016-11-28: 2 [IU] via SUBCUTANEOUS

## 2016-11-27 MED ORDER — SIMVASTATIN 10 MG PO TABS
10.0000 mg | ORAL_TABLET | Freq: Every day | ORAL | Status: DC
  Administered 2016-11-28: 10 mg via ORAL
  Filled 2016-11-27: qty 1

## 2016-11-27 MED ORDER — PANTOPRAZOLE SODIUM 40 MG PO TBEC
40.0000 mg | DELAYED_RELEASE_TABLET | Freq: Every day | ORAL | Status: DC
  Administered 2016-11-28: 40 mg via ORAL
  Filled 2016-11-27: qty 1

## 2016-11-27 MED ORDER — SENNOSIDES-DOCUSATE SODIUM 8.6-50 MG PO TABS
2.0000 | ORAL_TABLET | Freq: Two times a day (BID) | ORAL | Status: DC
  Administered 2016-11-27 – 2016-11-28 (×2): 2 via ORAL
  Filled 2016-11-27 (×2): qty 2

## 2016-11-27 MED ORDER — LEVETIRACETAM 500 MG PO TABS
500.0000 mg | ORAL_TABLET | Freq: Two times a day (BID) | ORAL | Status: DC
  Administered 2016-11-27 – 2016-11-28 (×2): 500 mg via ORAL
  Filled 2016-11-27 (×2): qty 1

## 2016-11-27 MED ORDER — VITAMIN D3 25 MCG (1000 UNIT) PO TABS
2000.0000 [IU] | ORAL_TABLET | Freq: Every day | ORAL | Status: DC
  Administered 2016-11-28: 2000 [IU] via ORAL
  Filled 2016-11-27: qty 2

## 2016-11-27 MED ORDER — CLOPIDOGREL BISULFATE 75 MG PO TABS
75.0000 mg | ORAL_TABLET | Freq: Every day | ORAL | Status: DC
  Administered 2016-11-28: 75 mg via ORAL
  Filled 2016-11-27: qty 1

## 2016-11-27 MED ORDER — SENNA-DOCUSATE SODIUM 8.6-50 MG PO TABS: 2.0000 | ORAL_TABLET | Freq: Two times a day (BID) | ORAL | Status: DC

## 2016-11-27 MED ORDER — CARVEDILOL 12.5 MG PO TABS
12.5000 mg | ORAL_TABLET | Freq: Two times a day (BID) | ORAL | Status: DC
  Administered 2016-11-28 (×2): 12.5 mg via ORAL
  Filled 2016-11-27 (×2): qty 1

## 2016-11-27 MED ORDER — LEVOTHYROXINE SODIUM 100 MCG PO TABS
100.0000 ug | ORAL_TABLET | Freq: Every day | ORAL | Status: DC
Start: 2016-11-28 — End: 2016-11-28
  Administered 2016-11-28: 100 ug via ORAL
  Filled 2016-11-27: qty 1

## 2016-11-27 MED ORDER — SODIUM CHLORIDE 0.9 % IV SOLN
Freq: Once | INTRAVENOUS | Status: AC
  Administered 2016-11-27: 22:00:00 via INTRAVENOUS

## 2016-11-27 NOTE — Clinical Social Work Note (Signed)
Clinical Social Work Assessment  Patient Details  Name: Jean Hodges MRN: [id number] Date of Birth: September 19, 1933  Date of referral:  11/27/16               Reason for consult:  Facility Placement                Permission sought to share information with:  Facility Sport and exercise psychologist, Family Supports Permission granted to share information::  Yes, Verbal Permission Granted  Name::        Agency::     Relationship::     Contact Information:     Housing/Transportation Living arrangements for the past 2 months:  [place] of Information:  Patient, Adult Children Patient Interpreter Needed:  None Criminal Activity/Legal Involvement Pertinent to Current Situation/Hospitalization:    Significant Relationships:  Adult Children Lives with:  Facility Resident Do you feel safe going back to the place where you live?  Yes Need for family participation in patient care:  Yes (Comment)  Care giving concerns:  Pt has HX of dementia   Facilities manager / plan:  CSW met with pt and and spoke to pt's relative Jean Hodges and confirmed pt's plan to be discharged back to [facility] to live at discharge.  CSW provided active listening and validated pt's son's concerns.   Pt's son gave CSW Dept permission to complete FL-2 and return pt to [facility] facility via the hub per pt's and pt's son's request.  Pt has been living at [facility] since March 2018, prior to being admitted to [facility].   Employment status:  Retired Forensic scientist:  Medicaid In [place], [place] PT Recommendations:  Not assessed at this time Information / Referral to community resources:     Patient/Family's Response to care:  Patient presents with fluctuating alertness and orientation.  Patient and pt's son agreeable to plan.  Pt's son supportive and strongly involved in pt.'s care.  Pt.'s son and the pt pleasant and appreciated CSW intervention.    Patient/Family's Understanding of and  Emotional Response to Diagnosis, Current Treatment, and Prognosis:  None listed by pt/family  Emotional Assessment Appearance:  Appears stated age Attitude/Demeanor/Rapport:    Affect (typically observed):  Accepting, Adaptable, Calm, Pleasant Orientation:   (Pt has history of dementia and presents with fluctuating orientation) Alcohol / Substance use:    Psych involvement (Current and /or in the community):     Discharge Needs  Concerns to be addressed:  No discharge needs identified Readmission within the last 30 days:  No Current discharge risk:  None Barriers to Discharge:  No Barriers Identified   Claudine Mouton, LCSWA 11/27/2016, 10:06 PM

## 2016-11-27 NOTE — ED Notes (Signed)
Lab reports adding on new orders.

## 2016-11-27 NOTE — H&P (Signed)
History and Physical    Jean Hodges PIR:[id number] DOB: [birth year] DOA: 11/27/2016  PCP: System, Pcp Not In  Patient coming from: Nursing home.  Chief Complaint: Low hemoglobin.  HPI: Jean Hodges is a 83 y.o. female with history of chronic anemia probably from myelodysplasia was referred to the ER after patient's routine blood work showed hemoglobin around 6.5. Patient denies any chest pain or shortness of breath. Does state she has fatigue on exertion. Patient routinely gets transfused once every 3-4 months.   ED Course: In the ER hemoglobin initially was around 7. 9 repeat one showed to be around 6.5. Stool for blood was negative. Patient is being admitted for transfusion.  Review of Systems: As per HPI, rest all negative.   Past Medical History:  Diagnosis Date  . Acute encephalopathy   . Acute encephalopathy 09/30/2016  . Alzheimer's dementia without behavioral disturbance   . Alzheimer's dementia without behavioral disturbance 10/07/2016  . Anemia   . Anemia 01/28/2014  . Atherosclerotic PVD with ulceration ([place])   . CAD (coronary artery disease)   . CAD (coronary artery disease) 01/04/2014  . Cellulitis   . Cellulitis of left leg   . Diabetes mellitus without complication ([place])   . DM (diabetes mellitus) ([place]) 01/27/2014  . HTN (hypertension) 01/04/2014  . Hyperlipidemia   . Hypertension   . Hypoglycemia associated with type 2 diabetes mellitus ([place])   . Macrocytic anemia   . Seizures ([place]) 09/30/2016  . Tear of left rotator cuff   . UTI (urinary tract infection)   . UTI (urinary tract infection) 09/25/2016    History reviewed. No pertinent surgical history.   reports that she has never smoked. She has never used smokeless tobacco. She reports that she does not drink alcohol or use drugs.  Allergies  Allergen Reactions  . Acetaminophen   . Codeine Nausea Only  . Dilaudid [Hydromorphone]   . Hydrocodone     Hallucinations  . Keflex [Cephalexin] Diarrhea     Family History  Problem Relation Age of Onset  . Family history unknown: Yes    Prior to Admission medications   Medication Sig Start Date End Date Taking? Authorizing Provider  carvedilol (COREG) 12.5 MG tablet Take 12.5 mg by mouth 2 (two) times daily with a meal.   Yes [provider]  cholecalciferol (VITAMIN D) 1000 units tablet Take 2,000 Units by mouth daily.   Yes [provider]  clopidogrel (PLAVIX) 75 MG tablet Take 75 mg by mouth daily.    Yes [provider]  ferrous sulfate 325 (65 FE) MG tablet Take 325 mg by mouth 2 (two) times daily with a meal.   Yes [provider]  levETIRAcetam (KEPPRA) 500 MG tablet Take 500 mg by mouth 2 (two) times daily.   Yes [provider]  levothyroxine (SYNTHROID, LEVOTHROID) 100 MCG tablet Take 100 mcg by mouth daily before breakfast.   Yes [provider]  Nutritional Supplements (NUTRITIONAL SUPPLEMENT PO) Take 1 each by mouth 2 (two) times daily. Magic Cup   Yes [provider]  omeprazole (PRILOSEC) 20 MG capsule Take 20 mg by mouth daily.   Yes [provider]  polyethylene glycol (MIRALAX / GLYCOLAX) packet Take 17 g by mouth daily.   Yes [provider]  sennosides-docusate sodium (SENOKOT-S) 8.6-50 MG tablet Take 2 tablets by mouth 2 (two) times daily.   Yes [provider]  sertraline (ZOLOFT) 100 MG tablet Take 100 mg by mouth daily.  Yes [provider]  simvastatin (ZOCOR) 10 MG tablet Take 10 mg by mouth daily.   Yes [provider]    Physical Exam: Vitals:   11/27/16 1930 11/27/16 2030 11/27/16 2031 11/27/16 2134  BP: (!) 129/53 (!) 122/55 (!) 137/50 (!) 137/55  Pulse: 78 76 75 79  Resp: 15 18 14 18   Temp:      TempSrc:      SpO2: 98% 100% 100% 100%  Weight:      Height:          Constitutional: Moderately built and nourished. Vitals:   11/27/16 1930 11/27/16 2030 11/27/16 2031 11/27/16 2134  BP: (!)  129/53 (!) 122/55 (!) 137/50 (!) 137/55  Pulse: 78 76 75 79  Resp: 15 18 14 18   Temp:      TempSrc:      SpO2: 98% 100% 100% 100%  Weight:      Height:       Eyes: Anicteric mild pallor. ENMT: No discharge from the ears eyes nose and mouth. Neck: No mass felt. No neck rigidity. No JVD appreciated. Respiratory: No rhonchi or crepitations. Cardiovascular: S1-S2 heard no murmurs appreciated. Abdomen: Soft nontender bowel sounds present. Musculoskeletal: Right BKA. Skin: No rash. Neurologic: Alert awake oriented to name and place. Moves all extremities. Psychiatric: Oriented to her name and place.   Labs on Admission: I have personally reviewed following labs and imaging studies  CBC:  Recent Labs Lab 11/27/16 1635 11/27/16 1943  WBC 7.5  --   NEUTROABS 5.6  --   HGB 7.9* 6.5*  HCT 23.4* 19.0*  MCV 102.2*  --   PLT 161  --    Basic Metabolic Panel:  Recent Labs Lab 11/27/16 1635 11/27/16 1943  NA 136 138  K 4.2 4.2  CL 104 102  CO2 26  --   GLUCOSE 154* 144*  BUN 17 17  CREATININE 0.69 0.60  CALCIUM 8.6*  --    GFR: Estimated Creatinine Clearance: 47.4 mL/min (by C-G formula based on SCr of 0.6 mg/dL). Liver Function Tests: No results for input(s): AST, ALT, ALKPHOS, BILITOT, PROT, ALBUMIN in the last 168 hours. No results for input(s): LIPASE, AMYLASE in the last 168 hours. No results for input(s): AMMONIA in the last 168 hours. Coagulation Profile: No results for input(s): INR, PROTIME in the last 168 hours. Cardiac Enzymes: No results for input(s): CKTOTAL, CKMB, CKMBINDEX, TROPONINI in the last 168 hours. BNP (last 3 results) No results for input(s): PROBNP in the last 8760 hours. HbA1C: No results for input(s): HGBA1C in the last 72 hours. CBG: No results for input(s): GLUCAP in the last 168 hours. Lipid Profile: No results for input(s): CHOL, HDL, LDLCALC, TRIG, CHOLHDL, LDLDIRECT in the last 72 hours. Thyroid Function Tests: No results for  input(s): TSH, T4TOTAL, FREET4, T3FREE, THYROIDAB in the last 72 hours. Anemia Panel: No results for input(s): VITAMINB12, FOLATE, FERRITIN, TIBC, IRON, RETICCTPCT in the last 72 hours. Urine analysis: No results found for: COLORURINE, APPEARANCEUR, LABSPEC, PHURINE, GLUCOSEU, HGBUR, BILIRUBINUR, KETONESUR, PROTEINUR, UROBILINOGEN, NITRITE, LEUKOCYTESUR Sepsis Labs: @LABRCNTIP (procalcitonin:4,lacticidven:4) )No results found for this or any previous visit (from the past 240 hour(s)).   Radiological Exams on Admission: No results found.   Assessment/Plan Principal Problem:   Symptomatic anemia Active Problems:   Tonic clonic seizures (HCC)   Hypothyroidism (acquired)   Type 2 diabetes mellitus with peripheral vascular disease (HCC)   PVD (peripheral vascular disease) ([place])   Essential hypertension   Dementia without behavioral  disturbance   Coronary artery disease involving native coronary artery of native heart without angina pectoris   Anemia    1. Symptomatic anemia with history of chronic anemia probably from myelodysplasia - at this time I have ordered 2 units of PRBC. If hemoglobin increases appropriately then may be discharged back to her facility. 2. History of paroxysmal atrial fibrillation - patient is on antiplatelet agents only. On Coreg. 3. History of cardiomyopathy status post ICD placement - no acute issues. 4. History of seizures on Keppra. 5. History of diabetes mellitus per chart - not on any medications presently on sliding scale coverage. 6. History of dementia. 7. Hypothyroidism on Synthroid. 8. Right BKA.   DVT prophylaxis: SCDs. Code Status: Full code.  Family Communication: No family at the bedside.  Disposition Plan: Back to facility.  Consults called: None.  Admission status: Observation.    Rise Patience MD [facility] Pager [phone].  If 7PM-7AM, please contact night-coverage [email or web address] Password [email or web address]  11/27/2016,  10:04 PM

## 2016-11-27 NOTE — ED Notes (Signed)
Bed: WA06 Expected date:  Expected time:  Means of arrival:  Comments: EMS/abnormal lab (anemic)

## 2016-11-27 NOTE — Progress Notes (Signed)
Patient does not meet criteria for observation unit.  Patient is an amputee and requires total care.     Johni Narine RN

## 2016-11-27 NOTE — ED Triage Notes (Signed)
Per [facility] pt from [facility] sent due to low hemoglobin (6.6) Facility unavailable to give much report. Pt denies any symptoms. According to EMS pt was given blood transfusion 2 weeks ago. Pt has hx of anemia.

## 2016-11-28 DIAGNOSIS — E1151 Type 2 diabetes mellitus with diabetic peripheral angiopathy without gangrene: Secondary | ICD-10-CM

## 2016-11-28 DIAGNOSIS — G40409 Other generalized epilepsy and epileptic syndromes, not intractable, without status epilepticus: Secondary | ICD-10-CM

## 2016-11-28 DIAGNOSIS — D509 Iron deficiency anemia, unspecified: Secondary | ICD-10-CM | POA: Diagnosis not present

## 2016-11-28 DIAGNOSIS — I2510 Atherosclerotic heart disease of native coronary artery without angina pectoris: Secondary | ICD-10-CM | POA: Diagnosis not present

## 2016-11-28 DIAGNOSIS — E039 Hypothyroidism, unspecified: Secondary | ICD-10-CM | POA: Diagnosis not present

## 2016-11-28 DIAGNOSIS — D649 Anemia, unspecified: Secondary | ICD-10-CM | POA: Diagnosis not present

## 2016-11-28 DIAGNOSIS — I10 Essential (primary) hypertension: Secondary | ICD-10-CM | POA: Diagnosis not present

## 2016-11-28 DIAGNOSIS — F0390 Unspecified dementia without behavioral disturbance: Secondary | ICD-10-CM | POA: Diagnosis not present

## 2016-11-28 LAB — GLUCOSE, CAPILLARY
GLUCOSE-CAPILLARY: 200 mg/dL — AB (ref 65–99)
Glucose-Capillary: 85 mg/dL (ref 65–99)
Glucose-Capillary: 120 mg/dL — ABNORMAL HIGH (ref 65–99)

## 2016-11-28 LAB — FOLATE: Folate: 9.5 ng/mL (ref >5.9)

## 2016-11-28 LAB — CBC
HEMATOCRIT: 30.7 % — AB (ref 36.0–46.0)
Hemoglobin: 10.2 g/dL — ABNORMAL LOW (ref 12.0–15.0)
MCH: 32.1 pg (ref 26.0–34.0)
MCHC: 33.2 g/dL (ref 30.0–36.0)
MCV: 96.5 fL (ref 78.0–100.0)
PLATELETS: 143 10*3/uL — AB (ref 150–400)
RBC: 3.18 MIL/uL — ABNORMAL LOW (ref 3.87–5.11)
RDW: 18.3 % — AB (ref 11.5–15.5)
WBC: 6.9 10*3/uL (ref 4.0–10.5)

## 2016-11-28 LAB — FERRITIN: FERRITIN: 1719 ng/mL — AB (ref 11–307)

## 2016-11-28 LAB — BASIC METABOLIC PANEL WITH GFR
Anion gap: 8 (ref 5–15)
BUN: 17 mg/dL (ref 6–20)
CO2: 26 mmol/L (ref 22–32)
Calcium: 8.9 mg/dL (ref 8.9–10.3)
Chloride: 104 mmol/L (ref 101–111)
Creatinine, Ser: 0.62 mg/dL (ref 0.44–1.00)
GFR calc Af Amer: >60 mL/min
GFR calc non Af Amer: >60 mL/min
Glucose, Bld: 83 mg/dL (ref 65–99)
Potassium: 4.1 mmol/L (ref 3.5–5.1)
Sodium: 138 mmol/L (ref 135–145)

## 2016-11-28 LAB — IRON AND TIBC
IRON: 133 ug/dL (ref 28–170)
Saturation Ratios: 66 % — ABNORMAL HIGH (ref 10.4–31.8)
TIBC: 202 ug/dL — AB (ref 250–450)
UIBC: 69 ug/dL

## 2016-11-28 LAB — VITAMIN B12: Vitamin B-12: 891 pg/mL (ref 180–914)

## 2016-11-28 LAB — MRSA PCR SCREENING: MRSA BY PCR: POSITIVE — AB

## 2016-11-28 MED ORDER — CHLORHEXIDINE GLUCONATE CLOTH 2 % EX PADS
6.0000 | MEDICATED_PAD | Freq: Every day | CUTANEOUS | Status: DC
  Administered 2016-11-28: 6 via TOPICAL

## 2016-11-28 MED ORDER — MUPIROCIN 2 % EX OINT
TOPICAL_OINTMENT | CUTANEOUS | Status: AC
  Administered 2016-11-28: 02:00:00
  Filled 2016-11-28: qty 22

## 2016-11-28 MED ORDER — MUPIROCIN 2 % EX OINT
1.0000 "application " | TOPICAL_OINTMENT | Freq: Two times a day (BID) | CUTANEOUS | Status: DC
  Administered 2016-11-28 (×2): 1 via NASAL
  Filled 2016-11-28: qty 22

## 2016-11-28 NOTE — Progress Notes (Signed)
Date: Nov 28, 2016 Discharge orders review for case management needs.  None found Velva Harman, BSN, [place], [place]: [phone]

## 2016-11-28 NOTE — Discharge Summary (Signed)
Physician Discharge Summary  Jean Hodges WYO:[id number] DOB: 11-08-1933 DOA: 11/27/2016  PCP: System, Pcp Not In  Admit date: 11/27/2016 Discharge date: 11/28/2016  Admitted From: SNF Disposition: SNF  Recommendations for Outpatient Follow-up:  1. F/u Hb regularly and arrange for transfusion in outpt transfusion center as needed   Discharge Condition:  stable  CODE STATUS:  Full code   Consultations:  none    Discharge Diagnoses:  Principal Problem:   Symptomatic anemia Active Problems:   Tonic clonic seizures (HCC)   Hypothyroidism (acquired)   Type 2 diabetes mellitus with peripheral vascular disease ([place])   PVD (peripheral vascular disease) ([place])   Essential hypertension   Dementia without behavioral disturbance   Coronary artery disease involving native coronary artery of native heart without angina pectoris    Subjective: No complaints. Confused to time and place.   Brief Summary: Jean Hodges is a 83 y.o. female with history of chronic anemia, dementia, seizures,  DM, Right BKA who was referred to the ER after patient's routine blood work showed hemoglobin around 6.5. Patient denies any chest pain or shortness of breath. Does state she has fatigue on exertion. Patient routinely gets transfused once every 3-4 months.   Hospital Course:  Principal Problem:   Symptomatic anemia - apparently gets frequent transfusions as outpt per HPI (patient has dementia and is unable to give me a history) - admitted overnight to receive a transfusion and transfused 2 U PRBC which has brought Hb up from 6.5 to 10.2 - takes Oral Iron- anemia panel shows Ferretin is quite high- would recommend referral to hematologist for further assessment if Hb continues to drop   Iron    133                UIBC  69           UIBC    TIBC  202           TIBC    Saturation Ratios  66           Saturation Ratios    Ferritin  1,719           Ferritin    Folate  9.5           Folate    OTHER CHEM   Vitamin B12  891     Active Problems:   Tonic clonic seizures     Hypothyroidism (acquired)   Type 2 diabetes mellitus with peripheral vascular disease ([place])   PVD (peripheral vascular disease) ([place])   Dementia without behavioral disturbance   Coronary artery disease involving native coronary artery of native heart without angina pectoris  Rt BKA   All above medical problems have been stable overnight. No medication changes have been made.    Discharge Instructions  Discharge Instructions    Diet - low sodium heart healthy    Complete by:  As directed    Diet - low sodium heart healthy    Complete by:  As directed    Diet Carb Modified    Complete by:  As directed    Increase activity slowly    Complete by:  As directed    Increase activity slowly    Complete by:  As directed      Allergies as of 11/28/2016      Reactions   Acetaminophen    Codeine Nausea Only   Dilaudid [hydromorphone]    Hydrocodone    Hallucinations   Keflex [cephalexin] Diarrhea  Medication List    TAKE these medications   carvedilol 12.5 MG tablet Commonly known as:  COREG Take 12.5 mg by mouth 2 (two) times daily with a meal.   cholecalciferol 1000 units tablet Commonly known as:  VITAMIN D Take 2,000 Units by mouth daily.   clopidogrel 75 MG tablet Commonly known as:  PLAVIX Take 75 mg by mouth daily.   ferrous sulfate 325 (65 FE) MG tablet Take 325 mg by mouth 2 (two) times daily with a meal.   levETIRAcetam 500 MG tablet Commonly known as:  KEPPRA Take 500 mg by mouth 2 (two) times daily.   levothyroxine 100 MCG tablet Commonly known as:  SYNTHROID, LEVOTHROID Take 100 mcg by mouth daily before breakfast.   NUTRITIONAL SUPPLEMENT PO Take 1 each by mouth 2 (two) times daily. Magic Cup   omeprazole 20 MG capsule Commonly known as:  PRILOSEC Take 20 mg by mouth daily.   polyethylene glycol packet Commonly known as:  MIRALAX / GLYCOLAX Take 17 g by mouth daily.     sennosides-docusate sodium 8.6-50 MG tablet Commonly known as:  SENOKOT-S Take 2 tablets by mouth 2 (two) times daily.   sertraline 100 MG tablet Commonly known as:  ZOLOFT Take 100 mg by mouth daily.   simvastatin 10 MG tablet Commonly known as:  ZOCOR Take 10 mg by mouth daily.       Allergies  Allergen Reactions  . Acetaminophen   . Codeine Nausea Only  . Dilaudid [Hydromorphone]   . Hydrocodone     Hallucinations  . Keflex [Cephalexin] Diarrhea     Procedures/Studies:    No results found.     Discharge Exam: Vitals:   11/28/16 0206 11/28/16 0419  BP: (!) 137/47 (!) 119/50  Pulse: 78 75  Resp: 18 14  Temp: 98.4 F (36.9 C) 98.3 F (36.8 C)   Vitals:   11/28/16 0144 11/28/16 0203 11/28/16 0206 11/28/16 0419  BP: (!) 138/55  (!) 137/47 (!) 119/50  Pulse: 81  78 75  Resp: 18  18 14   Temp: 98.4 F (36.9 C)  98.4 F (36.9 C) 98.3 F (36.8 C)  TempSrc: Oral  Oral Oral  SpO2: 100%  100% 100%  Weight:  55 kg (121 lb 4.1 oz)    Height:        General: Pt is alert, awake, not in acute distress Cardiovascular: RRR, S1/S2 +, no rubs, no gallops Respiratory: CTA bilaterally, no wheezing, no rhonchi Abdominal: Soft, NT, ND, bowel sounds + Extremities: no edema, no cyanosis    The results of significant diagnostics from this hospitalization (including imaging, microbiology, ancillary and laboratory) are listed below for reference.     Microbiology: Recent Results (from the past 240 hour(s))  MRSA PCR Screening     Status: Abnormal   Collection Time: 11/27/16 10:02 PM  Result Value Ref Range Status   MRSA by PCR POSITIVE (A) NEGATIVE Final    Comment:        The GeneXpert MRSA Assay (FDA approved for NASAL specimens only), is one component of a comprehensive MRSA colonization surveillance program. It is not intended to diagnose MRSA infection nor to guide or monitor treatment for MRSA infections. RESULT CALLED TO, READ BACK BY AND VERIFIED  WITH: N.DUMAS,RN [id number] 11/28/16 W.SHEA      Labs: BNP (last 3 results) No results for input(s): BNP in the last 8760 hours. Basic Metabolic Panel:  Recent Labs Lab 11/27/16 1635 11/27/16 1943 11/28/16 0623  NA  136 138 138  K 4.2 4.2 4.1  CL 104 102 104  CO2 26  --  26  GLUCOSE 154* 144* 83  BUN 17 17 17   CREATININE 0.69 0.60 0.62  CALCIUM 8.6*  --  8.9   Liver Function Tests: No results for input(s): AST, ALT, ALKPHOS, BILITOT, PROT, ALBUMIN in the last 168 hours. No results for input(s): LIPASE, AMYLASE in the last 168 hours. No results for input(s): AMMONIA in the last 168 hours. CBC:  Recent Labs Lab 11/27/16 1635 11/27/16 1943 11/28/16 0623  WBC 7.5  --  6.9  NEUTROABS 5.6  --   --   HGB 7.9* 6.5* 10.2*  HCT 23.4* 19.0* 30.7*  MCV 102.2*  --  96.5  PLT 161  --  143*   Cardiac Enzymes: No results for input(s): CKTOTAL, CKMB, CKMBINDEX, TROPONINI in the last 168 hours. BNP: Invalid input(s): POCBNP CBG:  Recent Labs Lab 11/28/16 0737 11/28/16 1222  GLUCAP 85 120*   D-Dimer No results for input(s): DDIMER in the last 72 hours. Hgb A1c No results for input(s): HGBA1C in the last 72 hours. Lipid Profile No results for input(s): CHOL, HDL, LDLCALC, TRIG, CHOLHDL, LDLDIRECT in the last 72 hours. Thyroid function studies No results for input(s): TSH, T4TOTAL, T3FREE, THYROIDAB in the last 72 hours.  Invalid input(s): FREET3 Anemia work up  Recent Labs  11/27/16 1635 11/27/16 2242  VITAMINB12 891  --   FOLATE 9.5  --   FERRITIN 1,719*  --   TIBC 202*  --   IRON 133  --   RETICCTPCT  --  1.6   Urinalysis No results found for: COLORURINE, APPEARANCEUR, LABSPEC, [place], GLUCOSEU, [place], BILIRUBINUR, KETONESUR, PROTEINUR, UROBILINOGEN, NITRITE, LEUKOCYTESUR Sepsis Labs Invalid input(s): PROCALCITONIN,  WBC,  LACTICIDVEN Microbiology Recent Results (from the past 240 hour(s))  MRSA PCR Screening     Status: Abnormal   Collection Time: 11/27/16  10:02 PM  Result Value Ref Range Status   MRSA by PCR POSITIVE (A) NEGATIVE Final    Comment:        The GeneXpert MRSA Assay (FDA approved for NASAL specimens only), is one component of a comprehensive MRSA colonization surveillance program. It is not intended to diagnose MRSA infection nor to guide or monitor treatment for MRSA infections. RESULT CALLED TO, READ BACK BY AND VERIFIED WITH: N.DUMAS,RN 1950 11/28/16 W.SHEA      Time coordinating discharge: Over 30 minutes  SIGNED:   Debbe Odea, MD  [facility] 11/28/2016, 1:21 PM Pager   If 7PM-7AM, please contact night-coverage [email or web address] Password [email or web address]

## 2016-11-28 NOTE — ED Provider Notes (Signed)
[facility] DEPT Provider Note   CSN: [id number] Arrival date & time: 11/27/16  1512     History   Chief Complaint Chief Complaint  Patient presents with  . Abnormal Lab    Hemoglobin    HPI Jean Hodges is a 83 y.o. female.  HPI 83 year old Caucasian female past medical history significant for diabetes, Alzheimer's, hypertension, iron deficiency anemia that has received blood transfusions in the past, right foot amputation that presents to the emergency department today by EMS from [facility] place with complaints of low hemoglobin. Facility performed a CBC today that revealed a hemoglobin of 6.6. Patient does have a known anemia with baseline around 8. Did receive a transfusion and March of 2 units. The patient does report feeling weak and generalized fatigue over the past couple of months but denies any acute symptoms. Denies any shortness of breath, chest pain, lightheadedness, dizziness. Patient is able to walk at baseline with a walker. Patient denies any blood thinner use. Denies any melena or hematochezia. Past Medical History:  Diagnosis Date  . Acute encephalopathy   . Acute encephalopathy 09/30/2016  . Alzheimer's dementia without behavioral disturbance   . Alzheimer's dementia without behavioral disturbance 10/07/2016  . Anemia   . Anemia 01/28/2014  . Atherosclerotic PVD with ulceration ([place])   . CAD (coronary artery disease)   . CAD (coronary artery disease) 01/04/2014  . Cellulitis   . Cellulitis of left leg   . Diabetes mellitus without complication ([place])   . DM (diabetes mellitus) ([place]) 01/27/2014  . HTN (hypertension) 01/04/2014  . Hyperlipidemia   . Hypertension   . Hypoglycemia associated with type 2 diabetes mellitus ([place])   . Macrocytic anemia   . Seizures ([place]) 09/30/2016  . Tear of left rotator cuff   . UTI (urinary tract infection)   . UTI (urinary tract infection) 09/25/2016    Patient Active Problem List   Diagnosis Date Noted  . Symptomatic  anemia 11/27/2016  . Anemia 11/27/2016  . Tonic clonic seizures ([place]) 10/09/2016  . Oropharyngeal dysphagia 10/09/2016  . Moderate protein-calorie malnutrition ([place]) 10/09/2016  . Hypothyroidism (acquired) 10/09/2016  . Type 2 diabetes mellitus with peripheral vascular disease ([place]) 10/09/2016  . PVD (peripheral vascular disease) ([place]) 10/09/2016  . Essential hypertension 10/09/2016  . Dementia without behavioral disturbance 10/09/2016  . Chronic depression 10/09/2016  . Hyperlipidemia 10/09/2016  . Coronary artery disease involving native coronary artery of native heart without angina pectoris 10/09/2016    History reviewed. No pertinent surgical history.  OB History    No data available       Home Medications    Prior to Admission medications   Medication Sig Start Date End Date Taking? Authorizing Provider  carvedilol (COREG) 12.5 MG tablet Take 12.5 mg by mouth 2 (two) times daily with a meal.   Yes [provider]  cholecalciferol (VITAMIN D) 1000 units tablet Take 2,000 Units by mouth daily.   Yes [provider]  clopidogrel (PLAVIX) 75 MG tablet Take 75 mg by mouth daily.    Yes [provider]  ferrous sulfate 325 (65 FE) MG tablet Take 325 mg by mouth 2 (two) times daily with a meal.   Yes [provider]  levETIRAcetam (KEPPRA) 500 MG tablet Take 500 mg by mouth 2 (two) times daily.   Yes [provider]  levothyroxine (SYNTHROID, LEVOTHROID) 100 MCG tablet Take 100 mcg by mouth daily before breakfast.   Yes [provider]  Nutritional Supplements (NUTRITIONAL SUPPLEMENT PO)  Take 1 each by mouth 2 (two) times daily. Magic Cup   Yes [provider]  omeprazole (PRILOSEC) 20 MG capsule Take 20 mg by mouth daily.   Yes [provider]  polyethylene glycol (MIRALAX / GLYCOLAX) packet Take 17 g by mouth daily.   Yes [provider]  sennosides-docusate sodium (SENOKOT-S) 8.6-50 MG tablet Take 2  tablets by mouth 2 (two) times daily.   Yes [provider]  sertraline (ZOLOFT) 100 MG tablet Take 100 mg by mouth daily.   Yes [provider]  simvastatin (ZOCOR) 10 MG tablet Take 10 mg by mouth daily.   Yes [provider]    Family History Family History  Problem Relation Age of Onset  . Family history unknown: Yes    Social History Social History  Substance Use Topics  . Smoking status: Never Smoker  . Smokeless tobacco: Never Used  . Alcohol use No     Allergies   Acetaminophen; Codeine; Dilaudid [hydromorphone]; Hydrocodone; and Keflex [cephalexin]   Review of Systems Review of Systems  Constitutional: Negative for chills and fever.  HENT: Negative for congestion.   Eyes: Negative for visual disturbance.  Respiratory: Negative for cough and shortness of breath.   Cardiovascular: Negative for chest pain.  Gastrointestinal: Negative for blood in stool, diarrhea, nausea and vomiting.  Skin: Positive for pallor.  Neurological: Positive for weakness. Negative for dizziness, light-headedness, numbness and headaches.     Physical Exam Updated Vital Signs BP (!) 136/53   Pulse 81   Temp 98.6 F (37 C) (Oral)   Resp 16   Ht 5\' 6"  (1.676 m)   Wt 55 kg   SpO2 100%   BMI 19.57 kg/m   Physical Exam  Constitutional: She is oriented to person, place, and time. She appears well-developed and well-nourished. No distress.  Frail elderly woman resting on the bed in no acute distress. Pallor noted.  HENT:  Head: Normocephalic and atraumatic.  Mouth/Throat: Oropharynx is clear and moist.  Eyes: Conjunctivae are normal. Right eye exhibits no discharge. Left eye exhibits no discharge. No scleral icterus.  Neck: Normal range of motion. Neck supple. No thyromegaly present.  Cardiovascular: Normal rate, regular rhythm, normal heart sounds and intact distal pulses.   No tachycardia.  Pulmonary/Chest: Effort normal and breath sounds normal. No  respiratory distress. She has no wheezes. She has no rales. She exhibits no tenderness.  Abdominal: Soft. Bowel sounds are normal. She exhibits no distension. There is no tenderness. There is no rebound and no guarding.  Genitourinary:  Genitourinary Comments: Rectal exam performed with chaperone present. Soft brown stool noted in the rectal vault with normal rectal tone. No external and internal hemorrhoids noted. No gross melena or hematochezia noted.  Musculoskeletal: Normal range of motion.  Lymphadenopathy:    She has no cervical adenopathy.  Neurological: She is alert and oriented to person, place, and time.  Skin: Skin is warm and dry. There is pallor.  Nursing note and vitals reviewed.    ED Treatments / Results  Labs (all labs ordered are listed, but only abnormal results are displayed) Labs Reviewed  MRSA PCR SCREENING - Abnormal; Notable for the following:       Result Value   MRSA by PCR POSITIVE (*)    All other components within normal limits  BASIC METABOLIC PANEL - Abnormal; Notable for the following:    Glucose, Bld 154 (*)    Calcium 8.6 (*)    All other components  within normal limits  CBC WITH DIFFERENTIAL/PLATELET - Abnormal; Notable for the following:    RBC 2.29 (*)    Hemoglobin 7.9 (*)    HCT 23.4 (*)    MCV 102.2 (*)    MCH 34.5 (*)    RDW 18.2 (*)    All other components within normal limits  RETICULOCYTES - Abnormal; Notable for the following:    RBC. 2.06 (*)    All other components within normal limits  I-STAT CHEM 8, ED - Abnormal; Notable for the following:    Glucose, Bld 144 (*)    Hemoglobin 6.5 (*)    HCT 19.0 (*)    All other components within normal limits  FOLATE  VITAMIN B12  IRON AND TIBC  FERRITIN  BASIC METABOLIC PANEL  CBC  POC OCCULT BLOOD, ED  TYPE AND SCREEN  PREPARE RBC (CROSSMATCH)    EKG  EKG Interpretation None       Radiology No results found.  Procedures Procedures (including critical care  time)  Medications Ordered in ED Medications  sertraline (ZOLOFT) tablet 100 mg (not administered)  carvedilol (COREG) tablet 12.5 mg (not administered)  ferrous sulfate tablet 325 mg (not administered)  levETIRAcetam (KEPPRA) tablet 500 mg (500 mg Oral Given 11/27/16 2226)  levothyroxine (SYNTHROID, LEVOTHROID) tablet 100 mcg (not administered)  pantoprazole (PROTONIX) EC tablet 40 mg (not administered)  polyethylene glycol (MIRALAX / GLYCOLAX) packet 17 g (not administered)  simvastatin (ZOCOR) tablet 10 mg (not administered)  cholecalciferol (VITAMIN D) tablet 2,000 Units (not administered)  clopidogrel (PLAVIX) tablet 75 mg (not administered)  insulin aspart (novoLOG) injection 0-9 Units (not administered)  senna-docusate (Senokot-S) tablet 2 tablet (2 tablets Oral Given 11/27/16 2226)  0.9 %  sodium chloride infusion ( Intravenous New Bag/Given 11/27/16 2212)     Initial Impression / Assessment and Plan / ED Course  I have reviewed the triage vital signs and the nursing notes.  Pertinent labs & imaging results that were available during my care of the patient were reviewed by me and considered in my medical decision making (see chart for details).     The patient resents to the emergency department today going from nursing facility with a low hemoglobin of 6.6. Patient does have history of iron deficiency anemia and has required blood transfusions in the past with her last transfusion being in March 2018. Spoke with the facility states that she was supposed to have a blood transfusion last week but did not receive it due to unknown reason. Patient endorses feeling weak and fatigued over the past 2 months but denies any acute symptoms. Recheck CBC here reveals a hemoglobin of 7.9. Did want to go firmness so I repeated the hematocrit and hemoglobin. Repeat hemoglobin reveals 6.4 with seems consistent with her CBC from a nursing facility today. Patient does appear fairly pale. No  tachycardia noted. No shortness of breath or chest pain noted. Patient was Hemoccult negative. Denies any blood thinner use. Vital signs are stable. Baseline hemoglobin appears to be 8. Given the significant drop in her hemoglobin of 6.4 felt that patient will need admission for transfusion. Discussed transfusion with patient who agrees. Spoke with Dr. Gifford Shave with hospital medicine who agrees to admit patient will see patient in the ED and placed admission orders. Patient is currently hemodynamically stable and in no acute distress.  Final Clinical Impressions(s) / ED Diagnoses   Final diagnoses:  Anemia, unspecified type    New Prescriptions Current Discharge Medication List  Doristine Devoid, PA-C 11/28/16 0101    Fatima Blank, MD 11/28/16 [phone]

## 2016-11-28 NOTE — Progress Notes (Signed)
Spoke with pt's son Delfino Lovett via phone. Aware and agreeable to pt returning to [facility] today.  Called for transport.   Updated RN.  Plan: DC to [facility] ([place]   Sharren Bridge, MSW, LCSW Clinical Social Work 11/28/2016 [phone]

## 2016-11-28 NOTE — Progress Notes (Signed)
Patient is ready for discharge.CSW left voicemail for patient son. D/C summary faxed to facility.   Kathrin Greathouse, Latanya Presser, MSW Clinical Social Worker 5E and Psychiatric Service Line [phone] 11/28/2016  1:36 PM

## 2016-11-29 ENCOUNTER — Non-Acute Institutional Stay (SKILLED_NURSING_FACILITY): Payer: Medicare Other | Admitting: Internal Medicine

## 2016-11-29 ENCOUNTER — Encounter: Payer: Self-pay | Admitting: Internal Medicine

## 2016-11-29 DIAGNOSIS — E039 Hypothyroidism, unspecified: Secondary | ICD-10-CM | POA: Diagnosis not present

## 2016-11-29 DIAGNOSIS — K219 Gastro-esophageal reflux disease without esophagitis: Secondary | ICD-10-CM

## 2016-11-29 DIAGNOSIS — R531 Weakness: Secondary | ICD-10-CM

## 2016-11-29 DIAGNOSIS — I739 Peripheral vascular disease, unspecified: Secondary | ICD-10-CM | POA: Diagnosis not present

## 2016-11-29 DIAGNOSIS — G40409 Other generalized epilepsy and epileptic syndromes, not intractable, without status epilepticus: Secondary | ICD-10-CM | POA: Diagnosis not present

## 2016-11-29 DIAGNOSIS — D509 Iron deficiency anemia, unspecified: Secondary | ICD-10-CM | POA: Diagnosis not present

## 2016-11-29 DIAGNOSIS — I2510 Atherosclerotic heart disease of native coronary artery without angina pectoris: Secondary | ICD-10-CM

## 2016-11-29 DIAGNOSIS — F0390 Unspecified dementia without behavioral disturbance: Secondary | ICD-10-CM | POA: Diagnosis not present

## 2016-11-29 LAB — BPAM RBC
BLOOD PRODUCT EXPIRATION DATE: 201806012359
Blood Product Expiration Date: 201806012359
ISSUE DATE / TIME: 201805170132
ISSUE DATE / TIME: 201805162243
UNIT TYPE AND RH: 6200
Unit Type and Rh: 6200

## 2016-11-29 LAB — TYPE AND SCREEN
ABO/RH(D): A POS
ANTIBODY SCREEN: NEGATIVE
UNIT DIVISION: 0
Unit division: 0

## 2016-11-29 NOTE — Progress Notes (Signed)
LOCATION: [place]  PCP: System, Pcp Not In   Code Status: Full Code  Goals of care: Advanced Directive information Advanced Directives 11/27/2016  Does Patient Have a Medical Advance Directive? No  Would patient like information on creating a medical advance directive? No - Patient declined       Extended Emergency Contact Information Primary Emergency Contact: Rosner,RICHARD Address: [address] [place],  [place] Phone: [phone] Relation: None   Allergies  Allergen Reactions  . Acetaminophen   . Codeine Nausea Only  . Dilaudid [Hydromorphone]   . Hydrocodone     Hallucinations  . Keflex [Cephalexin] Diarrhea    Chief Complaint  Patient presents with  . Readmit To SNF    Readmission Visit      HPI:  Patient is a 83 y.o. female seen today for long term care post hospital re-admission from 11/27/16-11/28/16 with symptomatic anemia. She has history of chronic anemia. She received 2 u prbc transfusion. She is pleasantly confused, can carry out conversation and has been at her baseline per nursing. She is seen in her room today. She denies any concerns. She has medical history of dementia, diabetes mellitus, hypertension, coronary artery disease among others.   Review of Systems:  Constitutional: Negative for fever, chills. Energy level slowly coming back.   HENT: Negative for headache, congestion, nasal discharge, difficulty swallowing.   Eyes: Negative for double vision and discharge.  Respiratory: Negative for cough, shortness of breath  Cardiovascular: Negative for chest pain, palpitations.  Gastrointestinal: Negative for heartburn, nausea, vomiting, abdominal pain, loss of appetite. Doesn't remember her last bowel movement. Genitourinary: Negative for dysuria Musculoskeletal: Negative for back pain, fall in the facility.  Skin: Negative for itching, rash.  Neurological: Negative for dizziness. Psychiatric/Behavioral: Negative for  depression   Past Medical History:  Diagnosis Date  . Acute encephalopathy   . Acute encephalopathy 09/30/2016  . Alzheimer's dementia without behavioral disturbance   . Alzheimer's dementia without behavioral disturbance 10/07/2016  . Anemia   . Anemia 01/28/2014  . Atherosclerotic PVD with ulceration ([place])   . CAD (coronary artery disease)   . CAD (coronary artery disease) 01/04/2014  . Cellulitis   . Cellulitis of left leg   . Diabetes mellitus without complication ([place])   . DM (diabetes mellitus) ([place]) 01/27/2014  . HTN (hypertension) 01/04/2014  . Hyperlipidemia   . Hypertension   . Hypoglycemia associated with type 2 diabetes mellitus ([place])   . Macrocytic anemia   . Seizures ([place]) 09/30/2016  . Tear of left rotator cuff   . UTI (urinary tract infection)   . UTI (urinary tract infection) 09/25/2016   No past surgical history on file. Social History:   reports that she has never smoked. She has never used smokeless tobacco. She reports that she does not drink alcohol or use drugs.  Family History  Problem Relation Age of Onset  . Family history unknown: Yes    Medications: Allergies as of 11/29/2016      Reactions   Acetaminophen    Codeine Nausea Only   Dilaudid [hydromorphone]    Hydrocodone    Hallucinations   Keflex [cephalexin] Diarrhea      Medication List       Accurate as of 11/29/16 12:30 PM. Always use your most recent med list.          carvedilol 12.5 MG tablet Commonly known as:  COREG Take 12.5  mg by mouth 2 (two) times daily with a meal.   cholecalciferol 1000 units tablet Commonly known as:  VITAMIN D Take 2,000 Units by mouth daily.   clopidogrel 75 MG tablet Commonly known as:  PLAVIX Take 75 mg by mouth daily.   ferrous sulfate 325 (65 FE) MG tablet Take 325 mg by mouth 2 (two) times daily with a meal.   levETIRAcetam 500 MG tablet Commonly known as:  KEPPRA Take 500 mg by mouth 2 (two) times daily.   levothyroxine 100  MCG tablet Commonly known as:  SYNTHROID, LEVOTHROID Take 100 mcg by mouth daily before breakfast.   NUTRITIONAL SUPPLEMENT PO Take 1 each by mouth 2 (two) times daily. Magic Cup   omeprazole 20 MG capsule Commonly known as:  PRILOSEC Take 20 mg by mouth daily.   polyethylene glycol packet Commonly known as:  MIRALAX / GLYCOLAX Take 17 g by mouth daily.   sennosides-docusate sodium 8.6-50 MG tablet Commonly known as:  SENOKOT-S Take 2 tablets by mouth 2 (two) times daily.   sertraline 100 MG tablet Commonly known as:  ZOLOFT Take 100 mg by mouth daily.   simvastatin 10 MG tablet Commonly known as:  ZOCOR Take 10 mg by mouth daily.       Immunizations: Immunization History  Administered Date(s) Administered  . PPD Test 10/08/2016     Physical Exam: Vitals:   11/29/16 1227  BP: 128/76  Pulse: 76  Resp: 20  Temp: (!) 96.1 F (35.6 C)  TempSrc: Oral  SpO2: 95%  Weight: 118 lb (53.5 kg)  Height: 5\' 6"  (1.676 m)   Body mass index is 19.05 kg/m.  General- elderly female, frail and thin built, in no acute distress Head- normocephalic, atraumatic Nose- no nasal discharge Throat- moist mucus membrane, edentulous Eyes- PERRLA, EOMI, no pallor, no icterus, no discharge Neck- no cervical lymphadenopathy Cardiovascular- normal A0,T6, + systolic murmur Respiratory- bilateral clear to auscultation, no wheeze, no rhonchi, no crackles, no use of accessory muscles Abdomen- bowel sounds present, soft, non tender, no guarding or rigidity Musculoskeletal- able to move all 4 extremities, generalized weakness, right BKA with dressing in place, left heel floater, arthritis changes to her fingers Neurological- alert and oriented to self only Skin- warm and dry    Labs reviewed: Basic Metabolic Panel:  Recent Labs  11/27/16 1635 11/27/16 1943 11/28/16 0623  NA 136 138 138  K 4.2 4.2 4.1  CL 104 102 104  CO2 26  --  26  GLUCOSE 154* 144* 83  BUN 17 17 17     CREATININE 0.69 0.60 0.62  CALCIUM 8.6*  --  8.9   Liver Function Tests:  Recent Labs  10/03/16 10/07/16 10/15/16  AST 11 16 18   ALT 12 20 20   ALKPHOS 70 91 90   No results for input(s): LIPASE, AMYLASE in the last 8760 hours. No results for input(s): AMMONIA in the last 8760 hours. CBC:  Recent Labs  10/22/16 11/27/16 1635 11/27/16 1943 11/28/16 0623  WBC 6.8 7.5  --  6.9  NEUTROABS 4 5.6  --   --   HGB 7.4* 7.9* 6.5* 10.2*  HCT 23* 23.4* 19.0* 30.7*  MCV  --  102.2*  --  96.5  PLT 123* 161  --  143*     Assessment/Plan  Generalized weakness Will have patient work with PT/OT as tolerated to regain strength and restore function.  Fall precautions are in place.  Iron def anemia Recent 2  u PRBC transfusion.  Continue ferrous sulfate 325 mg bid. Make follow up with hematology.   Tonic clonic seizure Remains seizure free. Continue keppra 500 mg bid and monitor.   gerd Continue omeprazole 20 mg daily  Hypothyroidism Continue levothyroxine 100 g daily.   PVD s/p right BKA and has chronic skin changes to left leg. Continue plavix 75 mg daily and simvastatin 10 mg daily.  Dementia Provide supportive care. Fall precautions. Pressure ulcer prophylaxis  Coronary artery disease Remains chest pain-free. Continue Coreg, Plavix and statin.    Goals of care: long term care   Labs/tests ordered: cbc  Family/ staff Communication: reviewed care plan with patient and nursing supervisor    Blanchie Serve, MD Internal Medicine [place], [place] [address] Cell Phone (Monday-Friday 8 am - 5 pm): [phone] On Call: [phone] and follow prompts after 5 pm and on weekends Office Phone: [phone] Office Fax: [phone]

## 2016-12-05 ENCOUNTER — Emergency Department (HOSPITAL_COMMUNITY): Payer: Medicare Other

## 2016-12-05 ENCOUNTER — Emergency Department (HOSPITAL_COMMUNITY)
Admission: EM | Admit: 2016-12-05 | Discharge: 2016-12-06 | Disposition: A | Discharge status: Home / Self Care | Payer: Medicare Other | Attending: Emergency Medicine | Admitting: Emergency Medicine

## 2016-12-05 DIAGNOSIS — G309 Alzheimer's disease, unspecified: Secondary | ICD-10-CM | POA: Diagnosis not present

## 2016-12-05 DIAGNOSIS — Z79899 Other long term (current) drug therapy: Secondary | ICD-10-CM | POA: Insufficient documentation

## 2016-12-05 DIAGNOSIS — I10 Essential (primary) hypertension: Secondary | ICD-10-CM | POA: Insufficient documentation

## 2016-12-05 DIAGNOSIS — E039 Hypothyroidism, unspecified: Secondary | ICD-10-CM | POA: Insufficient documentation

## 2016-12-05 DIAGNOSIS — I2510 Atherosclerotic heart disease of native coronary artery without angina pectoris: Secondary | ICD-10-CM | POA: Insufficient documentation

## 2016-12-05 DIAGNOSIS — R0789 Other chest pain: Secondary | ICD-10-CM | POA: Insufficient documentation

## 2016-12-05 DIAGNOSIS — R079 Chest pain, unspecified: Secondary | ICD-10-CM | POA: Diagnosis present

## 2016-12-05 DIAGNOSIS — E119 Type 2 diabetes mellitus without complications: Secondary | ICD-10-CM | POA: Insufficient documentation

## 2016-12-05 DIAGNOSIS — R0689 Other abnormalities of breathing: Secondary | ICD-10-CM | POA: Diagnosis not present

## 2016-12-05 LAB — CBG MONITORING, ED: GLUCOSE-CAPILLARY: 112 mg/dL — AB (ref 65–99)

## 2016-12-05 LAB — PROTIME-INR
INR: 1.24
PROTHROMBIN TIME: 15.7 s — AB (ref 11.4–15.2)

## 2016-12-05 MED ORDER — ASPIRIN 81 MG PO CHEW
324.0000 mg | CHEWABLE_TABLET | Freq: Once | ORAL | Status: AC
  Administered 2016-12-05: 324 mg via ORAL
  Filled 2016-12-05: qty 4

## 2016-12-05 NOTE — ED Notes (Signed)
Patient transported to X-ray 

## 2016-12-05 NOTE — ED Triage Notes (Signed)
Per [facility] pt was having chest pain this evening. PER ems PT HAD no sob, diophoretic ans no chest pain up[on arrival. Pt stated to ems chest pain lasted about 5 mins. 148/56. cbg 174. 00% ra

## 2016-12-05 NOTE — ED Provider Notes (Addendum)
[facility] DEPT Provider Note   CSN: [id number] Arrival date & time: 12/05/16  2220     History   Chief Complaint Chief Complaint  Patient presents with  . Chest Pain    HPI Jean Hodges is a 83 y.o. female.  HPI  Patient with a history of Alzheimer's disease presents from her nursing facility after report of chest pain. Patient cannot specify when the pain began, but states that began earlier today. She notes the pain is diffuse anterior, and denies current dyspnea. She denies feeling feverish, chilled. She denies vomiting, diarrhea. Dementia precludes additional details of the history of present illness. Level V caveat. Nursing home chart suggests patient had chest pain earlier today, was sent here for evaluation.    Past Medical History:  Diagnosis Date  . Acute encephalopathy   . Acute encephalopathy 09/30/2016  . Alzheimer's dementia without behavioral disturbance   . Alzheimer's dementia without behavioral disturbance 10/07/2016  . Anemia   . Anemia 01/28/2014  . Atherosclerotic PVD with ulceration ([place])   . CAD (coronary artery disease)   . CAD (coronary artery disease) 01/04/2014  . Cellulitis   . Cellulitis of left leg   . Diabetes mellitus without complication ([place])   . DM (diabetes mellitus) ([place]) 01/27/2014  . HTN (hypertension) 01/04/2014  . Hyperlipidemia   . Hypertension   . Hypoglycemia associated with type 2 diabetes mellitus ([place])   . Macrocytic anemia   . Seizures ([place]) 09/30/2016  . Tear of left rotator cuff   . UTI (urinary tract infection)   . UTI (urinary tract infection) 09/25/2016    Patient Active Problem List   Diagnosis Date Noted  . Symptomatic anemia 11/27/2016  . Tonic-clonic seizure disorder ([place]) 10/09/2016  . Oropharyngeal dysphagia 10/09/2016  . Moderate protein-calorie malnutrition ([place]) 10/09/2016  . Hypothyroidism (acquired) 10/09/2016  . Type 2 diabetes mellitus with peripheral vascular disease ([place]) 10/09/2016    . PVD (peripheral vascular disease) ([place]) 10/09/2016  . Essential hypertension 10/09/2016  . Dementia without behavioral disturbance 10/09/2016  . Chronic depression 10/09/2016  . Hyperlipidemia 10/09/2016  . Coronary artery disease involving native coronary artery of native heart without angina pectoris 10/09/2016    No past surgical history on file.  OB History    No data available       Home Medications    Prior to Admission medications   Medication Sig Start Date End Date Taking? Authorizing Provider  carvedilol (COREG) 12.5 MG tablet Take 12.5 mg by mouth 2 (two) times daily with a meal.    [provider]  cholecalciferol (VITAMIN D) 1000 units tablet Take 2,000 Units by mouth daily.    [provider]  clopidogrel (PLAVIX) 75 MG tablet Take 75 mg by mouth daily.     [provider]  ferrous sulfate 325 (65 FE) MG tablet Take 325 mg by mouth 2 (two) times daily with a meal.    [provider]  levETIRAcetam (KEPPRA) 500 MG tablet Take 500 mg by mouth 2 (two) times daily.    [provider]  levothyroxine (SYNTHROID, LEVOTHROID) 100 MCG tablet Take 100 mcg by mouth daily before breakfast.    [provider]  Nutritional Supplements (NUTRITIONAL SUPPLEMENT PO) Take 1 each by mouth 2 (two) times daily. Magic Cup    [provider]  omeprazole (PRILOSEC) 20 MG capsule Take 20 mg by mouth daily.    [provider]  polyethylene glycol (MIRALAX / GLYCOLAX) packet Take 17 g by mouth  daily.    [provider]  sennosides-docusate sodium (SENOKOT-S) 8.6-50 MG tablet Take 2 tablets by mouth 2 (two) times daily.    [provider]  sertraline (ZOLOFT) 100 MG tablet Take 100 mg by mouth daily.    [provider]  simvastatin (ZOCOR) 10 MG tablet Take 10 mg by mouth daily.    [provider]    Family History Family History  Problem Relation Age of Onset  . Family history  unknown: Yes    Social History Social History  Substance Use Topics  . Smoking status: Never Smoker  . Smokeless tobacco: Never Used  . Alcohol use No     Allergies   Acetaminophen; Codeine; Dilaudid [hydromorphone]; Hydrocodone; and Keflex [cephalexin]   Review of Systems Review of Systems  Unable to perform ROS: Dementia     Physical Exam Updated Vital Signs Ht 5\' 6"  (1.676 m)   Wt 54.4 kg (120 lb)   BMI 19.37 kg/m   Physical Exam  Constitutional: She has a sickly appearance. No distress.  HENT:  Head: Normocephalic and atraumatic.  Eyes: Conjunctivae and EOM are normal.  Cardiovascular: Normal rate and regular rhythm.   Murmur heard. Pulmonary/Chest: Effort normal and breath sounds normal. No stridor. No respiratory distress.  Abdominal: She exhibits no distension.  Musculoskeletal: She exhibits no edema.  Neurological: She is alert. No cranial nerve deficit.  Skin: Skin is warm and dry.  Psychiatric: She is withdrawn. Cognition and memory are impaired.  Nursing note and vitals reviewed.    ED Treatments / Results  Labs (all labs ordered are listed, but only abnormal results are displayed) Labs Reviewed  CBG MONITORING, ED - Abnormal; Notable for the following:       Result Value   Glucose-Capillary 112 (*)    All other components within normal limits  COMPREHENSIVE METABOLIC PANEL  MAGNESIUM  TROPONIN I  BRAIN NATRIURETIC PEPTIDE  CBC WITH DIFFERENTIAL/PLATELET  PROTIME-INR    EKG Ventricular paced rate 80, abnormal Radiology Dg Chest 2 View  Result Date: 12/05/2016 CLINICAL DATA:  Acute onset of generalized chest pain. Initial encounter. EXAM: CHEST  2 VIEW COMPARISON:  Chest radiograph performed 08/02/2016 FINDINGS: Small bilateral pleural effusions are noted. Mild bibasilar atelectasis is noted. No pneumothorax is identified. The heart is mildly enlarged. The patient is status post median sternotomy. A pacemaker/AICD is noted at the left chest  wall, with leads ending at the right atrium, right ventricle and coronary sinus. No acute osseous abnormalities are seen. There is chronic superior subluxation of both humeral heads. IMPRESSION: Small bilateral pleural effusions, with mild bibasilar atelectasis. Mild cardiomegaly. Electronically Signed   By: Garald Balding M.D.   On: 12/05/2016 23:03    Procedures Procedures (including critical care time)  Medications Ordered in ED Medications  aspirin chewable tablet 324 mg (not administered)     Initial Impression / Assessment and Plan / ED Course  I have reviewed the triage vital signs and the nursing notes.  Pertinent labs & imaging results that were available during my care of the patient were reviewed by me and considered in my medical decision making (see chart for details).  On repeat exam the patient is in no distress, she sitting upright smiling.  This elderly female presents from nursing facility after a report of chest pain. Here she is awake, alert, in no distress. There is no lab, EKG, chest x-ray evidence for ongoing coronary ischemia, pneumonia or other acute new pathology. Patient discharged in  stable condition to her nursing facility.  Final Clinical Impressions(s) / ED Diagnoses   Final diagnoses:  Atypical chest pain     Carmin Muskrat, MD 12/06/16 7867    Carmin Muskrat, MD 12/12/16 2118

## 2016-12-06 LAB — CBC WITH DIFFERENTIAL/PLATELET
BASOS PCT: 0 %
Basophils Absolute: 0 10*3/uL (ref 0.0–0.1)
EOS ABS: 0.3 10*3/uL (ref 0.0–0.7)
EOS PCT: 4 %
HCT: 30.5 % — ABNORMAL LOW (ref 36.0–46.0)
Hemoglobin: 10 g/dL — ABNORMAL LOW (ref 12.0–15.0)
Lymphocytes Relative: 26 %
Lymphs Abs: 1.7 10*3/uL (ref 0.7–4.0)
MCH: 32.7 pg (ref 26.0–34.0)
MCHC: 32.8 g/dL (ref 30.0–36.0)
MCV: 99.7 fL (ref 78.0–100.0)
Monocytes Absolute: 0.3 10*3/uL (ref 0.1–1.0)
Monocytes Relative: 5 %
Neutro Abs: 4.4 10*3/uL (ref 1.7–7.7)
Neutrophils Relative %: 65 %
Platelets: 103 10*3/uL — ABNORMAL LOW (ref 150–400)
RBC: 3.06 MIL/uL — ABNORMAL LOW (ref 3.87–5.11)
RDW: 17 % — AB (ref 11.5–15.5)
WBC: 6.8 10*3/uL (ref 4.0–10.5)

## 2016-12-06 LAB — COMPREHENSIVE METABOLIC PANEL WITH GFR
ALT: 36 U/L (ref 14–54)
AST: 32 U/L (ref 15–41)
Albumin: 3.2 g/dL — ABNORMAL LOW (ref 3.5–5.0)
Alkaline Phosphatase: 89 U/L (ref 38–126)
Anion gap: 6 (ref 5–15)
BUN: 18 mg/dL (ref 6–20)
CO2: 26 mmol/L (ref 22–32)
Calcium: 8.8 mg/dL — ABNORMAL LOW (ref 8.9–10.3)
Chloride: 104 mmol/L (ref 101–111)
Creatinine, Ser: 0.65 mg/dL (ref 0.44–1.00)
GFR calc Af Amer: >60 mL/min
GFR calc non Af Amer: >60 mL/min
Glucose, Bld: 123 mg/dL — ABNORMAL HIGH (ref 65–99)
Potassium: 4 mmol/L (ref 3.5–5.1)
Sodium: 136 mmol/L (ref 135–145)
Total Bilirubin: 0.5 mg/dL (ref 0.3–1.2)
Total Protein: 6 g/dL — ABNORMAL LOW (ref 6.5–8.1)

## 2016-12-06 LAB — TROPONIN I: Troponin I: <0.03 ng/mL

## 2016-12-06 LAB — MAGNESIUM: Magnesium: 1.7 mg/dL (ref 1.7–2.4)

## 2016-12-06 LAB — BRAIN NATRIURETIC PEPTIDE: B Natriuretic Peptide: 162.3 pg/mL — ABNORMAL HIGH (ref 0.0–100.0)

## 2016-12-06 NOTE — ED Notes (Signed)
Called [facility] 

## 2016-12-06 NOTE — Discharge Instructions (Signed)
As discussed, your evaluation today has been largely reassuring.  But, it is important that you monitor your condition carefully, and do not hesitate to return to the ED if you develop new, or concerning changes in your condition. ? ?Otherwise, please follow-up with your physician for appropriate ongoing care. ? ?

## 2016-12-19 ENCOUNTER — Telehealth: Payer: Self-pay | Admitting: Neurology

## 2016-12-19 ENCOUNTER — Encounter: Payer: Self-pay | Admitting: Neurology

## 2016-12-19 ENCOUNTER — Ambulatory Visit (INDEPENDENT_AMBULATORY_CARE_PROVIDER_SITE_OTHER): Payer: Medicare Other | Admitting: Neurology

## 2016-12-19 VITALS — BP 119/62 | HR 80

## 2016-12-19 DIAGNOSIS — F0280 Dementia in other diseases classified elsewhere without behavioral disturbance: Secondary | ICD-10-CM | POA: Diagnosis not present

## 2016-12-19 DIAGNOSIS — I2510 Atherosclerotic heart disease of native coronary artery without angina pectoris: Secondary | ICD-10-CM | POA: Diagnosis not present

## 2016-12-19 DIAGNOSIS — G301 Alzheimer's disease with late onset: Secondary | ICD-10-CM

## 2016-12-19 DIAGNOSIS — G40409 Other generalized epilepsy and epileptic syndromes, not intractable, without status epilepticus: Secondary | ICD-10-CM | POA: Diagnosis not present

## 2016-12-19 NOTE — Progress Notes (Signed)
Reason for visit: Seizures  Referring physician: Dr. [name] is a 83 y.o. female  History of present illness:  Jean Hodges is an 83 year old right-handed white female with a history of a progressive dementing illness consistent with Alzheimer's disease. The patient was admitted to the hospital at [facility] on 09/25/2016 with altered mental status. The patient had been noted to be unresponsive around 3 AM, she had leftward gaze preference. The patient was brought to the hospital and was noted to have a generalized seizure event, she required intubation. The patient underwent an EEG study that showed right posterior irritability and slowing. A CT scan of the brain was done and did not show any acute changes, this was repeated on March 21 again showing no acute changes. The patient underwent a 24-hour EEG study that showed posterior period lateralizing discharges with burst suppression pattern. She was treated with Keppra, she has done well on this medication. The patient comes to the office today from [facility], she does not have anybody with her today. It is not clear whether any recurring seizures have been noted on Keppra. The patient requires full assistance in the extended care facility. The patient is nonambulatory, she has a right below-knee amputation.  Past Medical History:  Diagnosis Date  . Acute encephalopathy   . Acute encephalopathy 09/30/2016  . Alzheimer's dementia without behavioral disturbance   . Alzheimer's dementia without behavioral disturbance 10/07/2016  . Anemia   . Anemia 01/28/2014  . Atherosclerotic PVD with ulceration ([place])   . CAD (coronary artery disease)   . CAD (coronary artery disease) 01/04/2014  . Cellulitis   . Cellulitis of left leg   . Diabetes mellitus without complication ([place])   . DM (diabetes mellitus) ([place]) 01/27/2014  . HTN (hypertension) 01/04/2014  . Hyperlipidemia   . Hypertension   . Hypoglycemia associated with type 2  diabetes mellitus ([place])   . Macrocytic anemia   . Seizures ([place]) 09/30/2016  . Tear of left rotator cuff   . UTI (urinary tract infection)   . UTI (urinary tract infection) 09/25/2016    History reviewed. No pertinent surgical history.  Family History  Problem Relation Age of Onset  . Family history unknown: Yes    Social history:  reports that she has never smoked. She has never used smokeless tobacco. She reports that she does not drink alcohol or use drugs.  Medications:  Prior to Admission medications   Medication Sig Start Date End Date Taking? Authorizing Provider  carvedilol (COREG) 12.5 MG tablet Take 12.5 mg by mouth 2 (two) times daily with a meal.   Yes [provider]  cholecalciferol (VITAMIN D) 1000 units tablet Take 2,000 Units by mouth daily.   Yes [provider]  clopidogrel (PLAVIX) 75 MG tablet Take 75 mg by mouth daily.    Yes [provider]  Collagenase (SANTYL EX) Apply 1 application topically daily. To sacral wound   Yes [provider]  ferrous sulfate 325 (65 FE) MG tablet Take 325 mg by mouth 2 (two) times daily with a meal.   Yes [provider]  levETIRAcetam (KEPPRA) 500 MG tablet Take 500 mg by mouth 2 (two) times daily.   Yes [provider]  levothyroxine (SYNTHROID, LEVOTHROID) 100 MCG tablet Take 100 mcg by mouth daily before breakfast.   Yes [provider]  Nutritional Supplements (NUTRITIONAL SUPPLEMENT PO) Take 1 each by mouth 2 (two) times daily. Magic Cup   Yes [provider]  omeprazole (PRILOSEC) 20 MG capsule Take 20 mg by mouth daily.   Yes [provider]  polyethylene glycol (MIRALAX / GLYCOLAX) packet Take 17 g by mouth daily.   Yes [provider]  sennosides-docusate sodium (SENOKOT-S) 8.6-50 MG tablet Take 2 tablets by mouth 2 (two) times daily.   Yes [provider]  sertraline (ZOLOFT) 100 MG tablet Take 100 mg by mouth daily.   Yes  [provider]  simvastatin (ZOCOR) 10 MG tablet Take 10 mg by mouth daily.   Yes [provider]      Allergies  Allergen Reactions  . Acetaminophen   . Codeine Nausea Only  . Dilaudid [Hydromorphone]   . Hydrocodone     Hallucinations  . Keflex [Cephalexin] Diarrhea    ROS:  Out of a complete 14 system review of symptoms, the patient complains only of the following symptoms, and all other reviewed systems are negative.  Seizures  Blood pressure 119/62, pulse 80, SpO2 96 %.  Physical Exam  General: The patient is alert and cooperative at the time of the examination.  Eyes: Pupils are equal, round, and reactive to light. Discs are flat bilaterally.  Neck: The neck is supple, no carotid bruits are noted.  Respiratory: The respiratory examination is clear.  Cardiovascular: The cardiovascular examination reveals a regular rate and rhythm, no obvious murmurs or rubs are noted.  Skin: Extremities are without significant edema. The patient has a right below-knee amputation. The patient has significant restriction with elevation at the shoulders bilaterally.  Neurologic Exam  Mental status: The patient is alert and oriented x 1 at the time of the examination (not oriented to place or date).  Cranial nerves: Facial symmetry is present. There is some decrease in pinprick sensation on the right face as compared to the left. The strength of the facial muscles and the muscles to head turning and shoulder shrug are normal bilaterally. Speech is well enunciated, no aphasia or dysarthria is noted. Extraocular movements are full. Visual fields are full. The tongue is midline, and the patient has symmetric elevation of the soft palate. No obvious hearing deficits are noted.  Motor: The motor testing reveals 5 over 5 strength of all 4 extremities. Good symmetric motor tone is noted throughout.  Sensory: Sensory testing is notable for good symmetry pinprick sensation on  arms, decreased on the right leg as compared to the left. Vibration sensation is symmetric on arms and legs. No evidence of extinction is noted.  Coordination: Cerebellar testing reveals good finger-nose-finger bilaterally.  Gait and station: Gait was not tested. The patient is in a wheelchair.  Reflexes: Deep tendon reflexes are symmetric, but are depressed bilaterally. Toes are downgoing on the left.   Assessment/Plan:  1. Alzheimer's disease  2. Seizure event  The patient had a significant seizure event requiring intubation in March. She has seemingly done well on Keppra, she is taking 500 mg twice daily. We will continue medication for now. The patient will follow-up through this office in 6 months, we will not alter the medication dosing unless the patient has a recurrent seizure episode.  Jill Alexanders MD 12/19/2016 10:51 AM  [facility] [address] [place], [place] [address]  Phone [phone] Fax [phone]

## 2016-12-19 NOTE — Telephone Encounter (Signed)
Janett Billow from [facility] called to inform that son forgot about this appointment today which caused pt to be here alone. Terrence Dupont confirmed that Dr Jannifer Franklin would somehow work it out to still be able to see pt today and make assesment (even though policy of [facility] is for pt to have someone with pt).  Janett Billow confirmed that transportation will be waiting for pt when appointment is over

## 2016-12-19 NOTE — Patient Instructions (Signed)
   Continue Keppra 500 mg twice a day, contact our office if any seizure recurrence is noted.

## 2017-06-23 ENCOUNTER — Ambulatory Visit: Payer: Medicare Other | Admitting: Adult Health

## 2017-07-23 ENCOUNTER — Observation Stay (HOSPITAL_COMMUNITY)
Admission: EM | Admit: 2017-07-23 | Discharge: 2017-07-24 | Disposition: A | Discharge status: Skilled Nursing Facility | Payer: Medicare Other | Attending: Internal Medicine | Admitting: Internal Medicine

## 2017-07-23 ENCOUNTER — Other Ambulatory Visit: Payer: Self-pay

## 2017-07-23 ENCOUNTER — Observation Stay (HOSPITAL_COMMUNITY): Payer: Medicare Other

## 2017-07-23 ENCOUNTER — Encounter (HOSPITAL_COMMUNITY): Payer: Self-pay | Admitting: Emergency Medicine

## 2017-07-23 DIAGNOSIS — I509 Heart failure, unspecified: Secondary | ICD-10-CM | POA: Insufficient documentation

## 2017-07-23 DIAGNOSIS — Z885 Allergy status to narcotic agent status: Secondary | ICD-10-CM | POA: Insufficient documentation

## 2017-07-23 DIAGNOSIS — G301 Alzheimer's disease with late onset: Secondary | ICD-10-CM | POA: Diagnosis not present

## 2017-07-23 DIAGNOSIS — I4892 Unspecified atrial flutter: Secondary | ICD-10-CM | POA: Diagnosis not present

## 2017-07-23 DIAGNOSIS — E1151 Type 2 diabetes mellitus with diabetic peripheral angiopathy without gangrene: Secondary | ICD-10-CM | POA: Diagnosis present

## 2017-07-23 DIAGNOSIS — G309 Alzheimer's disease, unspecified: Secondary | ICD-10-CM | POA: Insufficient documentation

## 2017-07-23 DIAGNOSIS — F329 Major depressive disorder, single episode, unspecified: Secondary | ICD-10-CM | POA: Insufficient documentation

## 2017-07-23 DIAGNOSIS — I2510 Atherosclerotic heart disease of native coronary artery without angina pectoris: Secondary | ICD-10-CM | POA: Diagnosis present

## 2017-07-23 DIAGNOSIS — E039 Hypothyroidism, unspecified: Secondary | ICD-10-CM | POA: Diagnosis not present

## 2017-07-23 DIAGNOSIS — Z950 Presence of cardiac pacemaker: Secondary | ICD-10-CM | POA: Insufficient documentation

## 2017-07-23 DIAGNOSIS — D649 Anemia, unspecified: Principal | ICD-10-CM

## 2017-07-23 DIAGNOSIS — Z881 Allergy status to other antibiotic agents status: Secondary | ICD-10-CM | POA: Insufficient documentation

## 2017-07-23 DIAGNOSIS — G40409 Other generalized epilepsy and epileptic syndromes, not intractable, without status epilepticus: Secondary | ICD-10-CM | POA: Diagnosis not present

## 2017-07-23 DIAGNOSIS — I700 Atherosclerosis of aorta: Secondary | ICD-10-CM | POA: Insufficient documentation

## 2017-07-23 DIAGNOSIS — Z886 Allergy status to analgesic agent status: Secondary | ICD-10-CM | POA: Diagnosis not present

## 2017-07-23 DIAGNOSIS — Z7902 Long term (current) use of antithrombotics/antiplatelets: Secondary | ICD-10-CM | POA: Insufficient documentation

## 2017-07-23 DIAGNOSIS — I110 Hypertensive heart disease with heart failure: Secondary | ICD-10-CM | POA: Diagnosis not present

## 2017-07-23 DIAGNOSIS — E785 Hyperlipidemia, unspecified: Secondary | ICD-10-CM | POA: Insufficient documentation

## 2017-07-23 DIAGNOSIS — F0280 Dementia in other diseases classified elsewhere without behavioral disturbance: Secondary | ICD-10-CM | POA: Diagnosis present

## 2017-07-23 DIAGNOSIS — Z79899 Other long term (current) drug therapy: Secondary | ICD-10-CM | POA: Diagnosis not present

## 2017-07-23 DIAGNOSIS — F32A Depression, unspecified: Secondary | ICD-10-CM | POA: Diagnosis present

## 2017-07-23 DIAGNOSIS — I10 Essential (primary) hypertension: Secondary | ICD-10-CM | POA: Diagnosis present

## 2017-07-23 DIAGNOSIS — I4891 Unspecified atrial fibrillation: Secondary | ICD-10-CM | POA: Insufficient documentation

## 2017-07-23 DIAGNOSIS — D539 Nutritional anemia, unspecified: Secondary | ICD-10-CM | POA: Diagnosis present

## 2017-07-23 LAB — CBG MONITORING, ED: Glucose-Capillary: 82 mg/dL (ref 65–99)

## 2017-07-23 LAB — CBC WITH DIFFERENTIAL/PLATELET
Basophils Absolute: 0 10*3/uL (ref 0.0–0.1)
Basophils Relative: 0 %
EOS ABS: 0.2 10*3/uL (ref 0.0–0.7)
Eosinophils Relative: 3 %
HEMATOCRIT: 20.9 % — AB (ref 36.0–46.0)
Hemoglobin: 7.2 g/dL — ABNORMAL LOW (ref 12.0–15.0)
LYMPHS ABS: 1.8 10*3/uL (ref 0.7–4.0)
Lymphocytes Relative: 35 %
MCH: 37.9 pg — AB (ref 26.0–34.0)
MCHC: 34.4 g/dL (ref 30.0–36.0)
MCV: 110 fL — AB (ref 78.0–100.0)
MONOS PCT: 5 %
Monocytes Absolute: 0.3 10*3/uL (ref 0.1–1.0)
NEUTROS PCT: 57 %
Neutro Abs: 2.9 10*3/uL (ref 1.7–7.7)
Platelets: 119 10*3/uL — ABNORMAL LOW (ref 150–400)
RBC: 1.9 MIL/uL — ABNORMAL LOW (ref 3.87–5.11)
RDW: 14.1 % (ref 11.5–15.5)
WBC: 5.2 10*3/uL (ref 4.0–10.5)

## 2017-07-23 LAB — IRON AND TIBC
Iron: 164 ug/dL (ref 28–170)
SATURATION RATIOS: 97 % — AB (ref 10.4–31.8)
TIBC: 169 ug/dL — AB (ref 250–450)
UIBC: 5 ug/dL

## 2017-07-23 LAB — FERRITIN: FERRITIN: 1894 ng/mL — AB (ref 11–307)

## 2017-07-23 LAB — BASIC METABOLIC PANEL
Anion gap: 4 — ABNORMAL LOW (ref 5–15)
BUN: 12 mg/dL (ref 6–20)
CHLORIDE: 106 mmol/L (ref 101–111)
CO2: 29 mmol/L (ref 22–32)
CREATININE: 0.57 mg/dL (ref 0.44–1.00)
Calcium: 8.6 mg/dL — ABNORMAL LOW (ref 8.9–10.3)
GFR calc Af Amer: >60 mL/min (ref >60)
GFR calc non Af Amer: >60 mL/min (ref >60)
GLUCOSE: 80 mg/dL (ref 65–99)
Potassium: 4.1 mmol/L (ref 3.5–5.1)
SODIUM: 139 mmol/L (ref 135–145)

## 2017-07-23 LAB — PREPARE RBC (CROSSMATCH)

## 2017-07-23 LAB — FOLATE: Folate: 17.2 ng/mL (ref >5.9)

## 2017-07-23 LAB — POC OCCULT BLOOD, ED: FECAL OCCULT BLD: NEGATIVE

## 2017-07-23 LAB — PROTIME-INR
INR: 1.3
Prothrombin Time: 16 seconds — ABNORMAL HIGH (ref 11.4–15.2)

## 2017-07-23 LAB — RETICULOCYTES
RBC.: 1.89 MIL/uL — ABNORMAL LOW (ref 3.87–5.11)
RETIC CT PCT: 1.6 % (ref 0.4–3.1)
Retic Count, Absolute: 30.2 10*3/uL (ref 19.0–186.0)

## 2017-07-23 LAB — MRSA PCR SCREENING: MRSA BY PCR: POSITIVE — AB

## 2017-07-23 LAB — VITAMIN B12: Vitamin B-12: 1124 pg/mL — ABNORMAL HIGH (ref 180–914)

## 2017-07-23 MED ORDER — SIMVASTATIN 10 MG PO TABS
10.0000 mg | ORAL_TABLET | Freq: Every day | ORAL | Status: DC
  Administered 2017-07-24: 10 mg via ORAL
  Filled 2017-07-23: qty 1

## 2017-07-23 MED ORDER — CARVEDILOL 12.5 MG PO TABS
12.5000 mg | ORAL_TABLET | Freq: Two times a day (BID) | ORAL | Status: DC
  Administered 2017-07-23 – 2017-07-24 (×2): 12.5 mg via ORAL
  Filled 2017-07-23 (×2): qty 1

## 2017-07-23 MED ORDER — IBUPROFEN 200 MG PO TABS
400.0000 mg | ORAL_TABLET | Freq: Once | ORAL | Status: AC
  Administered 2017-07-23: 400 mg via ORAL
  Filled 2017-07-23: qty 2

## 2017-07-23 MED ORDER — PANTOPRAZOLE SODIUM 40 MG PO TBEC
40.0000 mg | DELAYED_RELEASE_TABLET | Freq: Every day | ORAL | Status: DC
  Administered 2017-07-24: 40 mg via ORAL
  Filled 2017-07-23: qty 1

## 2017-07-23 MED ORDER — LEVOTHYROXINE SODIUM 100 MCG PO TABS
100.0000 ug | ORAL_TABLET | Freq: Every day | ORAL | Status: DC
  Administered 2017-07-24: 100 ug via ORAL
  Filled 2017-07-23: qty 1

## 2017-07-23 MED ORDER — SENNOSIDES-DOCUSATE SODIUM 8.6-50 MG PO TABS
2.0000 | ORAL_TABLET | Freq: Two times a day (BID) | ORAL | Status: DC
  Administered 2017-07-23 – 2017-07-24 (×2): 2 via ORAL
  Filled 2017-07-23 (×2): qty 2

## 2017-07-23 MED ORDER — SODIUM CHLORIDE 0.9% FLUSH
3.0000 mL | Freq: Two times a day (BID) | INTRAVENOUS | Status: DC
  Administered 2017-07-23 – 2017-07-24 (×2): 3 mL via INTRAVENOUS

## 2017-07-23 MED ORDER — SODIUM CHLORIDE 0.9% FLUSH: 3.0000 mL | INTRAVENOUS | Status: DC | PRN

## 2017-07-23 MED ORDER — SODIUM CHLORIDE 0.9 % IV SOLN
250.0000 mL | INTRAVENOUS | Status: DC | PRN
Start: 2017-07-23 — End: 2017-07-24

## 2017-07-23 MED ORDER — SODIUM CHLORIDE 0.9 % IV SOLN
Freq: Once | INTRAVENOUS | Status: AC
  Administered 2017-07-23: 15:00:00 via INTRAVENOUS

## 2017-07-23 MED ORDER — CLOPIDOGREL BISULFATE 75 MG PO TABS
75.0000 mg | ORAL_TABLET | Freq: Every day | ORAL | Status: DC
  Administered 2017-07-23 – 2017-07-24 (×2): 75 mg via ORAL
  Filled 2017-07-23 (×2): qty 1

## 2017-07-23 MED ORDER — SODIUM CHLORIDE 0.9 % IV SOLN: Freq: Once | INTRAVENOUS | Status: DC

## 2017-07-23 MED ORDER — ONDANSETRON HCL 4 MG PO TABS: 4.0000 mg | ORAL_TABLET | Freq: Four times a day (QID) | ORAL | Status: DC | PRN

## 2017-07-23 MED ORDER — VITAMIN D3 25 MCG (1000 UNIT) PO TABS
2000.0000 [IU] | ORAL_TABLET | Freq: Every day | ORAL | Status: DC
  Administered 2017-07-24: 2000 [IU] via ORAL
  Filled 2017-07-23 (×2): qty 2

## 2017-07-23 MED ORDER — POLYETHYLENE GLYCOL 3350 17 G PO PACK
17.0000 g | PACK | Freq: Every day | ORAL | Status: DC
  Administered 2017-07-24: 17 g via ORAL
  Filled 2017-07-23: qty 1

## 2017-07-23 MED ORDER — FERROUS SULFATE 325 (65 FE) MG PO TABS
325.0000 mg | ORAL_TABLET | Freq: Two times a day (BID) | ORAL | Status: DC
  Administered 2017-07-23 – 2017-07-24 (×2): 325 mg via ORAL
  Filled 2017-07-23 (×2): qty 1

## 2017-07-23 MED ORDER — LEVETIRACETAM 500 MG PO TABS
500.0000 mg | ORAL_TABLET | Freq: Two times a day (BID) | ORAL | Status: DC
  Administered 2017-07-23 – 2017-07-24 (×2): 500 mg via ORAL
  Filled 2017-07-23 (×3): qty 1

## 2017-07-23 MED ORDER — SERTRALINE HCL 50 MG PO TABS
75.0000 mg | ORAL_TABLET | Freq: Every day | ORAL | Status: DC
  Administered 2017-07-24: 75 mg via ORAL
  Filled 2017-07-23: qty 1

## 2017-07-23 NOTE — H&P (Signed)
History and Physical    Jean Hodges PPI:[id number] DOB: [birth year] DOA: 07/23/2017  PCP: Blanchie Serve, MD  Patient coming from: snf  Chief Complaint:  Abnormal lab  HPI: Jean Hodges is a 84 y.o. female with medical history significant of dementia, chronic anemia requiring transfusions q 3 months or so, CAD, DM, HTN sent in from her SNF for low hemoglobin level and the need for another blood transfusion.  No report of bleeding.  Heme neg in ED.  Pt cannot provide any history due to her dementia.  She pleasantly and unreliably denies pain but appears comfortable in the eD.  Pt referred for admission for transfusion of prbcs.  Review of Systems: cannot be obtained due to dementia  Past Medical History:  Diagnosis Date  . Acute encephalopathy   . Acute encephalopathy 09/30/2016  . Alzheimer's dementia without behavioral disturbance   . Alzheimer's dementia without behavioral disturbance 10/07/2016  . Anemia   . Anemia 01/28/2014  . Atherosclerotic PVD with ulceration ([place])   . CAD (coronary artery disease)   . CAD (coronary artery disease) 01/04/2014  . Cellulitis   . Cellulitis of left leg   . Diabetes mellitus without complication ([place])   . DM (diabetes mellitus) ([place]) 01/27/2014  . HTN (hypertension) 01/04/2014  . Hyperlipidemia   . Hypertension   . Hypoglycemia associated with type 2 diabetes mellitus ([place])   . Macrocytic anemia   . Seizures ([place]) 09/30/2016  . Tear of left rotator cuff   . UTI (urinary tract infection)   . UTI (urinary tract infection) 09/25/2016    History reviewed. No pertinent surgical history.   reports that  has never smoked. she has never used smokeless tobacco. She reports that she does not drink alcohol or use drugs.  Allergies  Allergen Reactions  . Acetaminophen   . Codeine Nausea Only  . Dilaudid [Hydromorphone]   . Hydrocodone     Hallucinations  . Keflex [Cephalexin] Diarrhea    Family History  Family history unknown: Yes  due to  dementia  Prior to Admission medications   Medication Sig Start Date End Date Taking? Authorizing Provider  carvedilol (COREG) 12.5 MG tablet Take 12.5 mg by mouth 2 (two) times daily with a meal.   Yes [provider]  cholecalciferol (VITAMIN D) 1000 units tablet Take 2,000 Units by mouth daily.   Yes [provider]  clopidogrel (PLAVIX) 75 MG tablet Take 75 mg by mouth daily.    Yes [provider]  ferrous sulfate 325 (65 FE) MG tablet Take 325 mg by mouth 2 (two) times daily with a meal.   Yes [provider]  levETIRAcetam (KEPPRA) 500 MG tablet Take 500 mg by mouth 2 (two) times daily.   Yes [provider]  levothyroxine (SYNTHROID, LEVOTHROID) 100 MCG tablet Take 100 mcg by mouth daily before breakfast.   Yes [provider]  Multiple Vitamins-Minerals (DECUBI-VITE PO) Take 1 capsule by mouth daily.   Yes [provider]  omeprazole (PRILOSEC) 20 MG capsule Take 20 mg by mouth daily.   Yes [provider]  ondansetron (ZOFRAN) 4 MG tablet Take 4 mg by mouth every 6 (six) hours as needed for nausea or vomiting.   Yes [provider]  polyethylene glycol (MIRALAX / GLYCOLAX) packet Take 17 g by mouth daily.   Yes [provider]  sennosides-docusate sodium (SENOKOT-S) 8.6-50 MG tablet Take 2 tablets by mouth 2 (two) times daily.   Yes [provider]  sertraline (ZOLOFT) 50 MG tablet Take 75 mg by mouth daily.   Yes [provider]  simvastatin (ZOCOR) 10 MG tablet Take 10 mg by mouth daily.   Yes [provider]    Physical Exam: Vitals:   07/23/17 0927 07/23/17 0930 07/23/17 1136 07/23/17 1227  BP:   127/71 136/66  Pulse:   78 80  Resp:   17 17  Temp: 98.7 F (37.1 C) 98.7 F (37.1 C)    TempSrc: Oral Oral    SpO2:   96% 96%      Constitutional: NAD, calm, comfortable Vitals:   07/23/17 0927 07/23/17 0930 07/23/17 1136 07/23/17 1227  BP:   127/71 136/66    Pulse:   78 80  Resp:   17 17  Temp: 98.7 F (37.1 C) 98.7 F (37.1 C)    TempSrc: Oral Oral    SpO2:   96% 96%   Eyes: PERRL, lids and conjunctivae normal ENMT: Mucous membranes are moist. Posterior pharynx clear of any exudate or lesions.Normal dentition.  Neck: normal, supple, no masses, no thyromegaly Respiratory: clear to auscultation bilaterally, no wheezing, no crackles. Normal respiratory effort. No accessory muscle use.  Cardiovascular: Regular rate and rhythm, no murmurs / rubs / gallops. No extremity edema. 2+ pedal pulses. No carotid bruits.  Abdomen: no tenderness, no masses palpated. No hepatosplenomegaly. Bowel sounds positive.  Musculoskeletal: no clubbing / cyanosis. No joint deformity upper and lower extremities. Good ROM, no contractures. Normal muscle tone.  Skin: no rashes, lesions, ulcers. No induration Neurologic: CN 2-12 grossly intact. Sensation intact, DTR normal. Strength 5/5 in all 4.  Psychiatric: normal mood, pleasantly demented  Labs on Admission: I have personally reviewed following labs and imaging studies  CBC: Recent Labs  Lab 07/23/17 1008  WBC 5.2  NEUTROABS 2.9  HGB 7.2*  HCT 20.9*  MCV 110.0*  PLT 831*   Basic Metabolic Panel: Recent Labs  Lab 07/23/17 1008  NA 139  K 4.1  CL 106  CO2 29  GLUCOSE 80  BUN 12  CREATININE 0.57  CALCIUM 8.6*   GFR: CrCl cannot be calculated (Unknown ideal weight.). Liver Function Tests: No results for input(s): AST, ALT, ALKPHOS, BILITOT, PROT, ALBUMIN in the last 168 hours. No results for input(s): LIPASE, AMYLASE in the last 168 hours. No results for input(s): AMMONIA in the last 168 hours. Coagulation Profile: Recent Labs  Lab 07/23/17 1008  INR 1.30   Cardiac Enzymes: No results for input(s): CKTOTAL, CKMB, CKMBINDEX, TROPONINI in the last 168 hours. BNP (last 3 results) No results for input(s): PROBNP in the last 8760 hours. HbA1C: No results for input(s): HGBA1C in the last 72  hours. CBG: Recent Labs  Lab 07/23/17 0932  GLUCAP 82   Lipid Profile: No results for input(s): CHOL, HDL, LDLCALC, TRIG, CHOLHDL, LDLDIRECT in the last 72 hours. Thyroid Function Tests: No results for input(s): TSH, T4TOTAL, FREET4, T3FREE, THYROIDAB in the last 72 hours. Anemia Panel: No results for input(s): VITAMINB12, FOLATE, FERRITIN, TIBC, IRON, RETICCTPCT in the last 72 hours. Urine analysis: No results found for: COLORURINE, APPEARANCEUR, LABSPEC, PHURINE, GLUCOSEU, HGBUR, BILIRUBINUR, KETONESUR, PROTEINUR, UROBILINOGEN, NITRITE, LEUKOCYTESUR Sepsis Labs: !!!!!!!!!!!!!!!!!!!!!!!!!!!!!!!!!!!!!!!!!!!! @LABRCNTIP (procalcitonin:4,lacticidven:4) )No results found for this or any previous visit (from the past 240 hour(s)).   Radiological Exams on Admission: No results found.  Old chart reviewed Case discussed with edp  Assessment/Plan 84 yo female with chronic anemia possibly symptomatic  Principal Problem:   Anemia- transfuse 2 units prbc.  Repeat cbc  in am.  Attempted to have pt transfused in outpt transfusion clinic, but they do not do dementia patients in this setting, ck anemia panel.  Last hospitalization it was recommended to have pt follow up with hematology, unclear if this has been done  Active Problems:   Type 2 diabetes mellitus with peripheral vascular disease ([place])- cont home meds   Essential hypertension- cont home meds   Alzheimer disease- noted   Chronic depression- oted   Coronary artery disease involving native coronary artery of native heart without angina pectoris- noted    DVT prophylaxis:  scds Code Status:  Full code per paperwork for snf Family Communication:  none Disposition Plan:  Per day team, likely d/c tom Consults called:  none Admission status:  observation   Prynce Jacober A MD [facility]  If 7PM-7AM, please contact night-coverage [email or web address] Password [email or web address]  07/23/2017, 1:31 PM

## 2017-07-23 NOTE — Progress Notes (Signed)
Patient temperature was 100.8 after second unit blood finished transfusing, doctor on call was paged for orders.

## 2017-07-23 NOTE — ED Provider Notes (Signed)
[facility] Provider Note   CSN: [id number] Arrival date & time: 07/23/17  2330     History   Chief Complaint Chief Complaint  Patient presents with  . low hgb    HPI Jean Hodges is a 84 y.o. female.  HPI   84 year old female with past medical history as below including Alzheimer's dementia, living history, who presents from her [facility] for anemia.  The patient reportedly has a history of chronic anemia and has required transfusions in the past.  She also has a history of coronary artery disease and is status post pacemaker.  She had routine lab work drawn which showed hemoglobin 6.5 so she was sent here.  She has not had any overt bleeding per report.  She currently states that she feels tired but denies any complaints.  Specifically, she denies any chest pain, shortness of breath, or syncope. No abdominal pain.   Level 5 caveat invoked as remainder of history, ROS, and physical exam limited due to patient's dementia.   Past Medical History:  Diagnosis Date  . Acute encephalopathy   . Acute encephalopathy 09/30/2016  . Alzheimer's dementia without behavioral disturbance   . Alzheimer's dementia without behavioral disturbance 10/07/2016  . Anemia   . Anemia 01/28/2014  . Atherosclerotic PVD with ulceration ([place])   . CAD (coronary artery disease)   . CAD (coronary artery disease) 01/04/2014  . Cellulitis   . Cellulitis of left leg   . Diabetes mellitus without complication ([place])   . DM (diabetes mellitus) ([place]) 01/27/2014  . HTN (hypertension) 01/04/2014  . Hyperlipidemia   . Hypertension   . Hypoglycemia associated with type 2 diabetes mellitus ([place])   . Macrocytic anemia   . Seizures ([place]) 09/30/2016  . Tear of left rotator cuff   . UTI (urinary tract infection)   . UTI (urinary tract infection) 09/25/2016    Patient Active Problem List   Diagnosis Date Noted  . Anemia 07/23/2017  . Symptomatic anemia 11/27/2016  .  Tonic-clonic seizure disorder ([place]) 10/09/2016  . Oropharyngeal dysphagia 10/09/2016  . Moderate protein-calorie malnutrition ([place]) 10/09/2016  . Hypothyroidism (acquired) 10/09/2016  . Type 2 diabetes mellitus with peripheral vascular disease ([place]) 10/09/2016  . PVD (peripheral vascular disease) ([place]) 10/09/2016  . Essential hypertension 10/09/2016  . Alzheimer disease 10/09/2016  . Chronic depression 10/09/2016  . Hyperlipidemia 10/09/2016  . Coronary artery disease involving native coronary artery of native heart without angina pectoris 10/09/2016    History reviewed. No pertinent surgical history.  OB History    No data available       Home Medications    Prior to Admission medications   Medication Sig Start Date End Date Taking? Authorizing Provider  carvedilol (COREG) 12.5 MG tablet Take 12.5 mg by mouth 2 (two) times daily with a meal.   Yes [provider]  cholecalciferol (VITAMIN D) 1000 units tablet Take 2,000 Units by mouth daily.   Yes [provider]  clopidogrel (PLAVIX) 75 MG tablet Take 75 mg by mouth daily.    Yes [provider]  ferrous sulfate 325 (65 FE) MG tablet Take 325 mg by mouth 2 (two) times daily with a meal.   Yes [provider]  levETIRAcetam (KEPPRA) 500 MG tablet Take 500 mg by mouth 2 (two) times daily.   Yes [provider]  levothyroxine (SYNTHROID, LEVOTHROID) 100 MCG tablet Take 100 mcg by mouth daily before breakfast.   Yes [provider]  Multiple Vitamins-Minerals (DECUBI-VITE PO) Take 1 capsule by mouth daily.   Yes [provider]  omeprazole (PRILOSEC) 20 MG capsule Take 20 mg by mouth daily.   Yes [provider]  ondansetron (ZOFRAN) 4 MG tablet Take 4 mg by mouth every 6 (six) hours as needed for nausea or vomiting.   Yes [provider]  polyethylene glycol (MIRALAX / GLYCOLAX) packet Take 17 g by mouth daily.   Yes [provider]    sennosides-docusate sodium (SENOKOT-S) 8.6-50 MG tablet Take 2 tablets by mouth 2 (two) times daily.   Yes [provider]  sertraline (ZOLOFT) 50 MG tablet Take 75 mg by mouth daily.   Yes [provider]  simvastatin (ZOCOR) 10 MG tablet Take 10 mg by mouth daily.   Yes [provider]    Family History Family History  Family history unknown: Yes    Social History Social History   Tobacco Use  . Smoking status: Never Smoker  . Smokeless tobacco: Never Used  Substance Use Topics  . Alcohol use: No  . Drug use: No     Allergies   Acetaminophen; Codeine; Dilaudid [hydromorphone]; Hydrocodone; and Keflex [cephalexin]   Review of Systems Review of Systems  Unable to perform ROS: Dementia  Constitutional: Positive for fatigue.     Physical Exam Updated Vital Signs BP (!) (P) 155/70   Pulse (P) 91   Temp (P) 98.9 F (37.2 C) (Oral)   Resp (P) 16   SpO2 (P) 100%   Physical Exam  Constitutional: She is oriented to person, place, and time. She appears well-developed and well-nourished. No distress.  HENT:  Head: Normocephalic and atraumatic.  Eyes: Conjunctivae are normal.  Neck: Neck supple.  Cardiovascular: Normal rate, regular rhythm and normal heart sounds. Exam reveals no friction rub.  No murmur heard. Pulmonary/Chest: Effort normal and breath sounds normal. No respiratory distress. She has no wheezes. She has no rales.  Abdominal: She exhibits no distension.  Genitourinary:  Genitourinary Comments: No internal or external hemorrhoids appreciated.  Soft green stool in rectal vault  Musculoskeletal: She exhibits no edema.  Neurological: She is alert and oriented to person, place, and time. She exhibits normal muscle tone.  Skin: Skin is warm. Capillary refill takes less than 2 seconds.  Mild diffuse pallor  Psychiatric: She has a normal mood and affect.  Nursing note and vitals reviewed.    ED Treatments / Results  Labs (all  labs ordered are listed, but only abnormal results are displayed) Labs Reviewed  MRSA PCR SCREENING - Abnormal; Notable for the following components:      Result Value   MRSA by PCR POSITIVE (*)    All other components within normal limits  CBC WITH DIFFERENTIAL/PLATELET - Abnormal; Notable for the following components:   RBC 1.90 (*)    Hemoglobin 7.2 (*)    HCT 20.9 (*)    MCV 110.0 (*)    MCH 37.9 (*)    Platelets 119 (*)    All other components within normal limits  BASIC METABOLIC PANEL - Abnormal; Notable for the following components:   Calcium 8.6 (*)    Anion gap 4 (*)    All other components within normal limits  PROTIME-INR - Abnormal; Notable for the following components:   Prothrombin Time 16.0 (*)    All other components within normal limits  RETICULOCYTES - Abnormal; Notable for the following components:   RBC. 1.89 (*)    All other components  within normal limits  VITAMIN B12  IRON AND TIBC  FERRITIN  CBC  CBG MONITORING, ED  POC OCCULT BLOOD, ED  TYPE AND SCREEN  PREPARE RBC (CROSSMATCH)  PREPARE RBC (CROSSMATCH)    EKG  EKG Interpretation  Date/Time:  Wednesday July 23 2017 09:30:22 EST Ventricular Rate:  79 PR Interval:    QRS Duration: 145 QT Interval:  440 QTC Calculation: 505 R Axis:   -115 Text Interpretation:  Afib/flutter and ventricular-paced rhythm No further analysis attempted due to paced rhythm No significant change since last tracing Confirmed by Duffy Bruce [phone]) on 07/23/2017 12:31:11 PM       Radiology Dg Chest 2 View  Result Date: 07/23/2017 CLINICAL DATA:  Chronic anemia now with low hemoglobin requiring a blood transfusion. No cardiopulmonary complaints. History of dementia, type 2 diabetes, coronary artery disease with previous median sternotomy. EXAM: CHEST  2 VIEW COMPARISON:  Chest x-ray of Dec 05, 2016 FINDINGS: The lungs are adequately inflated. There are bilateral pleural effusions layering posteriorly which are  larger than those seen on the May 2018 study. The cardiac silhouette is mildly enlarged. The pulmonary vascularity is engorged and less distinct. The ICD is in stable position. The sternal wires are intact. There calcification in the wall of the aortic arch. The bony thorax exhibits no acute abnormality. IMPRESSION: CHF with moderate-sized pleural effusions and mild perihilar interstitial edema. No alveolar pneumonia. Thoracic aortic atherosclerosis. Electronically Signed   By: David  Martinique M.D.   On: 07/23/2017 14:09    Procedures .Critical Care Performed by: Duffy Bruce, MD Authorized by: Duffy Bruce, MD   Critical care provider statement:    Critical care time (minutes):  35   Critical care time was exclusive of:  Separately billable procedures and treating other patients and teaching time   Critical care was necessary to treat or prevent imminent or life-threatening deterioration of the following conditions:  Circulatory failure and cardiac failure   Critical care was time spent personally by me on the following activities:  Development of treatment plan with patient or surrogate, discussions with consultants, evaluation of patient's response to treatment, examination of patient, obtaining history from patient or surrogate, ordering and performing treatments and interventions, ordering and review of laboratory studies, ordering and review of radiographic studies, pulse oximetry, re-evaluation of patient's condition and review of old charts   I assumed direction of critical care for this patient from another provider in my specialty: no     (including critical care time)  Medications Ordered in ED Medications  levETIRAcetam (KEPPRA) tablet 500 mg (500 mg Oral Given 07/23/17 1700)  senna-docusate (Senokot-S) tablet 2 tablet (not administered)  polyethylene glycol (MIRALAX / GLYCOLAX) packet 17 g (not administered)  levothyroxine (SYNTHROID, LEVOTHROID) tablet 100 mcg (not administered)   pantoprazole (PROTONIX) EC tablet 40 mg (not administered)  ferrous sulfate tablet 325 mg (325 mg Oral Given 07/23/17 1706)  simvastatin (ZOCOR) tablet 10 mg (not administered)  carvedilol (COREG) tablet 12.5 mg (12.5 mg Oral Given 07/23/17 1705)  clopidogrel (PLAVIX) tablet 75 mg (75 mg Oral Given 07/23/17 1705)  cholecalciferol (VITAMIN D) tablet 2,000 Units (not administered)  sertraline (ZOLOFT) tablet 75 mg (not administered)  ondansetron (ZOFRAN) tablet 4 mg (not administered)  sodium chloride flush (NS) 0.9 % injection 3 mL (not administered)  sodium chloride flush (NS) 0.9 % injection 3 mL (not administered)  0.9 %  sodium chloride infusion (not administered)  0.9 %  sodium chloride infusion ( Intravenous New Bag/Given 07/23/17 1515)  Initial Impression / Assessment and Plan / ED Course  I have reviewed the triage vital signs and the nursing notes.  Pertinent labs & imaging results that were available during my care of the patient were reviewed by me and considered in my medical decision making (see chart for details).     84 year old female with past medical history as above who presents with generalized weakness.  Patient hemoglobin is 7.2 here.  It was reportedly 6.5 on outside labs.  The patient has known coronary disease with goal hemoglobin greater than 8.  Hemoccult negative and I see no signs of active external bleeding.  Her abdomen is soft and nontender.  She has a history of similar presentations.  Patient will need transfusion and will plan to observe overnight with serial CBCs.  Of note, CXR also c/f CHF. Will likely need diuresis after transfusion. May be 2/2 decompensated HF from anemia. Satting well on RA in no resp distress on exam, however.  Final Clinical Impressions(s) / ED Diagnoses   Final diagnoses:  Symptomatic anemia    ED Discharge Orders    None       Duffy Bruce, MD 07/23/17 2005

## 2017-07-23 NOTE — ED Triage Notes (Signed)
Patient brought in by [facility] from [facility] for low HGB 6.5 Hct 19.2.  Denies dark stools and has PMH of this before. Has Right BKA.

## 2017-07-23 NOTE — ED Notes (Signed)
Patient returned from radiology

## 2017-07-23 NOTE — ED Notes (Signed)
ED Provider at bedside. 

## 2017-07-24 DIAGNOSIS — F329 Major depressive disorder, single episode, unspecified: Secondary | ICD-10-CM | POA: Diagnosis not present

## 2017-07-24 DIAGNOSIS — I2510 Atherosclerotic heart disease of native coronary artery without angina pectoris: Secondary | ICD-10-CM

## 2017-07-24 DIAGNOSIS — I10 Essential (primary) hypertension: Secondary | ICD-10-CM | POA: Diagnosis not present

## 2017-07-24 DIAGNOSIS — E1151 Type 2 diabetes mellitus with diabetic peripheral angiopathy without gangrene: Secondary | ICD-10-CM

## 2017-07-24 DIAGNOSIS — G309 Alzheimer's disease, unspecified: Secondary | ICD-10-CM | POA: Diagnosis not present

## 2017-07-24 DIAGNOSIS — D638 Anemia in other chronic diseases classified elsewhere: Secondary | ICD-10-CM | POA: Diagnosis not present

## 2017-07-24 DIAGNOSIS — F0280 Dementia in other diseases classified elsewhere without behavioral disturbance: Secondary | ICD-10-CM | POA: Diagnosis not present

## 2017-07-24 DIAGNOSIS — D649 Anemia, unspecified: Secondary | ICD-10-CM

## 2017-07-24 MED ORDER — PANTOPRAZOLE SODIUM 40 MG PO TBEC: 40.0000 mg | DELAYED_RELEASE_TABLET | Freq: Every day | ORAL | Status: DC

## 2017-07-24 NOTE — Discharge Summary (Signed)
Physician Discharge Summary  Jean Hodges YIR:[id number] DOB: 12-09-1933 DOA: 07/23/2017  PCP: Blanchie Serve, MD  Admit date: 07/23/2017 Discharge date: 07/24/2017  Time spent: 30 minutes  Recommendations for Outpatient Follow-up:  1. Repeat CBC in 1 week to follow Hgb trend    Discharge Diagnoses:  Principal Problem:   Anemia Active Problems:   Type 2 diabetes mellitus with peripheral vascular disease ([place])   Essential hypertension   Alzheimer disease   Chronic depression   Coronary artery disease involving native coronary artery of native heart without angina pectoris   Discharge Condition: stable and improved. Discharge back to SNF for further care.   Diet recommendation: heart healthy diet   Filed Weights   07/24/17 0510  Weight: 51.8 kg (114 lb 3.2 oz)    History of present illness:  84 y.o. female with medical history significant of dementia, chronic anemia requiring transfusions q 3 months or so, CAD, DM, HTN sent in from her SNF for low hemoglobin level and the need for another blood transfusion.  No report of bleeding.  FOBT neg in ED.  Pt cannot provide any history due to her dementia. She denies CP, nausea, vomiting, abdominal pain or any other complaints.   Hospital Course:  1-symptomatic anemia: -base on anemia panel, appears to be anemia of chronic disease  -patient transfused 2 units of PRBC's and Hgb 10.0 at discharge -no signs of overt bleeding -will benefit of hematology follow up as previously recommended  -continue ferrous sulfate  -CBC in 1 week to follow Hgb trend   2-essential HTN -stable and well controlled -continue current antihypertensive regimen   3-alzheimer disease -continue aricept -cotninue supportive care  4-chronic depression -continue zoloft  5-hx of seizure  -stable and no seizure seen -continue keppra  6-HLD -continue zocor   7-hypothyroidism  -continue synthroid   8-hx of CAD -no CP  -no acute ischemic abnormalities  seen on EKG -continue plavix, zocor and coreg   Procedures:  None   Consultations:  None   Discharge Exam: Vitals:   07/24/17 0510 07/24/17 0931  BP: (!) 127/54 128/62  Pulse: 78 82  Resp: 16   Temp: 98 F (36.7 C)   SpO2: 99%     General: afebrile, no CP, no nausea, no vomiting and no abd pain. Patient denies any difficulty breathing and is in no distress.  Cardiovascular: S1 and s2, no rubs, no gallops Respiratory: good air movement, no wheezing, no crackles Abd: soft, NT, ND, positive BS Extremities:no edema, no cyanosis, no clubbing. 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  Discharge Instructions   Discharge Instructions    Diet - low sodium heart healthy   Complete by:  As directed    Discharge instructions   Complete by:  As directed    Maintain adequate hydration  Repeat CBC in 1 week to follow Hgb trend     Allergies as of 07/24/2017      Reactions   Acetaminophen    Codeine Nausea Only   Dilaudid [hydromorphone]    Hydrocodone    Hallucinations   Keflex [cephalexin] Diarrhea      Medication List    STOP taking these medications   omeprazole 20 MG capsule Commonly known as:  PRILOSEC Replaced by:  pantoprazole 40 MG tablet     TAKE these medications   carvedilol 12.5 MG tablet Commonly known as:  COREG Take 12.5 mg by mouth 2 (two) times daily with a meal.   cholecalciferol 1000 units tablet  Commonly known as:  VITAMIN D Take 2,000 Units by mouth daily.   clopidogrel 75 MG tablet Commonly known as:  PLAVIX Take 75 mg by mouth daily.   DECUBI-VITE PO Take 1 capsule by mouth daily.   ferrous sulfate 325 (65 FE) MG tablet Take 325 mg by mouth 2 (two) times daily with a meal.   levETIRAcetam 500 MG tablet Commonly known as:  KEPPRA Take 500 mg by mouth 2 (two) times daily.   levothyroxine 100 MCG tablet Commonly known as:  SYNTHROID, LEVOTHROID Take 100 mcg by mouth daily before breakfast.   ondansetron 4 MG tablet Commonly known  as:  ZOFRAN Take 4 mg by mouth every 6 (six) hours as needed for nausea or vomiting.   pantoprazole 40 MG tablet Commonly known as:  PROTONIX Take 1 tablet (40 mg total) by mouth daily. Start taking on:  07/25/2017 Replaces:  omeprazole 20 MG capsule   polyethylene glycol packet Commonly known as:  MIRALAX / GLYCOLAX Take 17 g by mouth daily.   sennosides-docusate sodium 8.6-50 MG tablet Commonly known as:  SENOKOT-S Take 2 tablets by mouth 2 (two) times daily.   sertraline 50 MG tablet Commonly known as:  ZOLOFT Take 75 mg by mouth daily.   simvastatin 10 MG tablet Commonly known as:  ZOCOR Take 10 mg by mouth daily.      Allergies  Allergen Reactions  . Acetaminophen   . Codeine Nausea Only  . Dilaudid [Hydromorphone]   . Hydrocodone     Hallucinations  . Keflex [Cephalexin] Diarrhea   Contact information for after-discharge care    Destination    [name] Follow up.   Service:  Skilled Nursing Contact information: [place] [phone]              The results of significant diagnostics from this hospitalization (including imaging, microbiology, ancillary and laboratory) are listed below for reference.    Significant Diagnostic Studies: Dg Chest 2 View  Result Date: 07/23/2017 CLINICAL DATA:  Chronic anemia now with low hemoglobin requiring a blood transfusion. No cardiopulmonary complaints. History of dementia, type 2 diabetes, coronary artery disease with previous median sternotomy. EXAM: CHEST  2 VIEW COMPARISON:  Chest x-ray of Dec 05, 2016 FINDINGS: The lungs are adequately inflated. There are bilateral pleural effusions layering posteriorly which are larger than those seen on the May 2018 study. The cardiac silhouette is mildly enlarged. The pulmonary vascularity is engorged and less distinct. The ICD is in stable position. The sternal wires are intact. There calcification in the wall of the aortic  arch. The bony thorax exhibits no acute abnormality. IMPRESSION: CHF with moderate-sized pleural effusions and mild perihilar interstitial edema. No alveolar pneumonia. Thoracic aortic atherosclerosis. Electronically Signed   By: David  Martinique M.D.   On: 07/23/2017 14:09    Microbiology: Recent Results (from the past 240 hour(s))  MRSA PCR Screening     Status: Abnormal   Collection Time: 07/23/17  4:26 PM  Result Value Ref Range Status   MRSA by PCR POSITIVE (A) NEGATIVE Final    Comment:        The GeneXpert MRSA Assay (FDA approved for NASAL specimens only), is one component of a comprehensive MRSA colonization surveillance program. It is not intended to diagnose MRSA infection nor to guide or monitor treatment for MRSA infections. RESULT CALLED TO, READ BACK BY AND VERIFIED WITH: BETH PASSMORE,RN [id number] @ [facility]  Labs: Basic Metabolic Panel: Recent Labs  Lab 07/23/17 1008  NA 139  K 4.1  CL 106  CO2 29  GLUCOSE 80  BUN 12  CREATININE 0.57  CALCIUM 8.6*   CBC: Recent Labs  Lab 07/23/17 1008 07/24/17 0446  WBC 5.2 16.5*  NEUTROABS 2.9  --   HGB 7.2* 10.0*  HCT 20.9* 29.2*  MCV 110.0* 102.8*  PLT 119* 93*   BNP (last 3 results) Recent Labs    12/05/16 2316  BNP 162.3*    CBG: Recent Labs  Lab 07/23/17 0932  GLUCAP 82    Signed:  Barton Dubois MD.  [facility] 07/24/2017, 1:30 PM

## 2017-07-24 NOTE — Progress Notes (Signed)
[facility] called for transport. ETA: 73 Min.   Kathrin Greathouse, Latanya Presser, MSW Clinical Social Worker  [phone] 07/24/2017  2:35 PM

## 2017-07-24 NOTE — Clinical Social Work Note (Addendum)
Clinical Social Work Assessment  Patient Details  Name: Jean Hodges MRN: [id number] Date of Birth: 1934-02-12  Date of referral:  07/24/17               Reason for consult:  Facility Placement                Permission sought to share information with:  Facility Sport and exercise psychologist, Family Supports Permission granted to share information::    Name::        Agency::   [name]   Relationship::   Son Jean, Hodges  Contact Information:    [phone]  Housing/Transportation Living arrangements for the past 2 months:  [name] of Information:  Facility Patient Interpreter Needed:  None Criminal Activity/Legal Involvement Pertinent to Current Situation/Hospitalization:  No - Comment as needed Significant Relationships:  Adult Children Lives with:  Facility Resident Do you feel safe going back to the place where you live?  Yes Need for family participation in patient care:  Yes (Patient has dementia)  Care giving concerns:   Patient admitted under observation for abnormal lab. Patient has chronic anemia requiring transfusion q three months or so. Patient admitted from [name].  Patient has dementia and only alert to self.   Social Worker assessment / plan:  CSW will assist with patient discharge back to [name] Facility reports since patient has been at facility for less than a day, they will not need an FL2. Patient will transport by [facility].  CSW left voicemail for patient son to inform him of patient discharge.   Nurse informed patient son about D/C.   Plan: discharge back to SNF-[name]  Employment status:  Retired Forensic scientist:  Medicare PT Recommendations:  Not assessed at this time Information / Referral to community resources:  [name]  Patient/Family's Response to care:  No family at bedside.   Patient/Family's Understanding of and Emotional Response to Diagnosis, Current Treatment, and Prognosis:  Unable to determine.   Emotional Assessment Appearance:    Attitude/Demeanor/Rapport:    Affect (typically observed):  Unable to Assess Orientation:  Oriented to Self Alcohol / Substance use:  Not Applicable Psych involvement (Current and /or in the community):  No (Comment)  Discharge Needs  Concerns to be addressed:  Discharge Planning Concerns Readmission within the last 30 days:  No Current discharge risk:  Cognitively Impaired(Dementia) Barriers to Discharge:  No Barriers Identified   [name], LCSW 07/24/2017, 2:13 PM

## 2017-07-24 NOTE — Discharge Planning (Signed)
Patient IV x2 DCd. RN assessment and VS revealed stability for DC back to [facility]. Report called and s/w Cassie Freer, LPN. Discharge papers printed, placed in packet and signature page in chart (patient unable to sign d/t AMS). No FU appts or scripts needed at this time. [facility] transporting back to facility. Son informed of DC earlier this AM, via phone.

## 2017-07-25 LAB — CBC
HEMATOCRIT: 29.2 % — AB (ref 36.0–46.0)
HEMOGLOBIN: 10 g/dL — AB (ref 12.0–15.0)
MCH: 35.2 pg — ABNORMAL HIGH (ref 26.0–34.0)
MCHC: 34.2 g/dL (ref 30.0–36.0)
MCV: 102.8 fL — AB (ref 78.0–100.0)
Platelets: 93 10*3/uL — ABNORMAL LOW (ref 150–400)
RBC: 2.84 MIL/uL — ABNORMAL LOW (ref 3.87–5.11)
RDW: 19.2 % — ABNORMAL HIGH (ref 11.5–15.5)
WBC: 16.5 10*3/uL — AB (ref 4.0–10.5)

## 2017-07-27 LAB — BPAM RBC
BLOOD PRODUCT EXPIRATION DATE: 201901292359
Blood Product Expiration Date: 201901292359
Blood Product Expiration Date: 201901292359
ISSUE DATE / TIME: 201901091502
ISSUE DATE / TIME: 201901091749
Unit Type and Rh: 6200
Unit Type and Rh: 6200
Unit Type and Rh: 6200

## 2017-07-27 LAB — TYPE AND SCREEN
ABO/RH(D): A POS
Antibody Screen: NEGATIVE
Unit division: 0
Unit division: 0
Unit division: 0

## 2017-10-09 ENCOUNTER — Encounter (HOSPITAL_COMMUNITY): Payer: Self-pay

## 2017-10-09 ENCOUNTER — Other Ambulatory Visit: Payer: Self-pay

## 2017-10-09 ENCOUNTER — Observation Stay (HOSPITAL_COMMUNITY)
Admission: EM | Admit: 2017-10-09 | Discharge: 2017-10-10 | Disposition: A | Discharge status: Skilled Nursing Facility | Payer: Medicare Other | Attending: Infectious Disease | Admitting: Infectious Disease

## 2017-10-09 DIAGNOSIS — Z881 Allergy status to other antibiotic agents status: Secondary | ICD-10-CM | POA: Insufficient documentation

## 2017-10-09 DIAGNOSIS — Z89511 Acquired absence of right leg below knee: Secondary | ICD-10-CM | POA: Diagnosis not present

## 2017-10-09 DIAGNOSIS — Z95810 Presence of automatic (implantable) cardiac defibrillator: Secondary | ICD-10-CM | POA: Insufficient documentation

## 2017-10-09 DIAGNOSIS — D649 Anemia, unspecified: Secondary | ICD-10-CM | POA: Diagnosis not present

## 2017-10-09 DIAGNOSIS — F329 Major depressive disorder, single episode, unspecified: Secondary | ICD-10-CM | POA: Insufficient documentation

## 2017-10-09 DIAGNOSIS — E1151 Type 2 diabetes mellitus with diabetic peripheral angiopathy without gangrene: Secondary | ICD-10-CM | POA: Diagnosis not present

## 2017-10-09 DIAGNOSIS — Z885 Allergy status to narcotic agent status: Secondary | ICD-10-CM | POA: Insufficient documentation

## 2017-10-09 DIAGNOSIS — I10 Essential (primary) hypertension: Secondary | ICD-10-CM | POA: Diagnosis present

## 2017-10-09 DIAGNOSIS — E785 Hyperlipidemia, unspecified: Secondary | ICD-10-CM | POA: Diagnosis not present

## 2017-10-09 DIAGNOSIS — E039 Hypothyroidism, unspecified: Secondary | ICD-10-CM | POA: Diagnosis present

## 2017-10-09 DIAGNOSIS — I480 Paroxysmal atrial fibrillation: Secondary | ICD-10-CM | POA: Diagnosis not present

## 2017-10-09 DIAGNOSIS — D696 Thrombocytopenia, unspecified: Secondary | ICD-10-CM | POA: Diagnosis not present

## 2017-10-09 DIAGNOSIS — I2510 Atherosclerotic heart disease of native coronary artery without angina pectoris: Secondary | ICD-10-CM | POA: Diagnosis not present

## 2017-10-09 DIAGNOSIS — R1312 Dysphagia, oropharyngeal phase: Secondary | ICD-10-CM | POA: Insufficient documentation

## 2017-10-09 DIAGNOSIS — G309 Alzheimer's disease, unspecified: Secondary | ICD-10-CM | POA: Diagnosis not present

## 2017-10-09 DIAGNOSIS — F0280 Dementia in other diseases classified elsewhere without behavioral disturbance: Secondary | ICD-10-CM | POA: Diagnosis present

## 2017-10-09 DIAGNOSIS — E43 Unspecified severe protein-calorie malnutrition: Secondary | ICD-10-CM

## 2017-10-09 DIAGNOSIS — D539 Nutritional anemia, unspecified: Principal | ICD-10-CM | POA: Insufficient documentation

## 2017-10-09 DIAGNOSIS — Z79899 Other long term (current) drug therapy: Secondary | ICD-10-CM | POA: Diagnosis not present

## 2017-10-09 DIAGNOSIS — G40409 Other generalized epilepsy and epileptic syndromes, not intractable, without status epilepticus: Secondary | ICD-10-CM | POA: Diagnosis not present

## 2017-10-09 LAB — I-STAT CHEM 8, ED
BUN: 19 mg/dL (ref 6–20)
CREATININE: 0.6 mg/dL (ref 0.44–1.00)
Calcium, Ion: 1.25 mmol/L (ref 1.15–1.40)
Chloride: 105 mmol/L (ref 101–111)
Glucose, Bld: 121 mg/dL — ABNORMAL HIGH (ref 65–99)
HEMATOCRIT: 21 % — AB (ref 36.0–46.0)
HEMOGLOBIN: 7.1 g/dL — AB (ref 12.0–15.0)
POTASSIUM: 4.5 mmol/L (ref 3.5–5.1)
SODIUM: 139 mmol/L (ref 135–145)
TCO2: 25 mmol/L (ref 22–32)

## 2017-10-09 LAB — CBC WITH DIFFERENTIAL/PLATELET
BASOS ABS: 0 10*3/uL (ref 0.0–0.1)
Basophils Relative: 0 %
Eosinophils Absolute: 0.1 10*3/uL (ref 0.0–0.7)
Eosinophils Relative: 2 %
HEMATOCRIT: 17.6 % — AB (ref 36.0–46.0)
Hemoglobin: 5.7 g/dL — CL (ref 12.0–15.0)
LYMPHS ABS: 1.9 10*3/uL (ref 0.7–4.0)
LYMPHS PCT: 31 %
MCH: 36.8 pg — ABNORMAL HIGH (ref 26.0–34.0)
MCHC: 32.4 g/dL (ref 30.0–36.0)
MCV: 113.5 fL — ABNORMAL HIGH (ref 78.0–100.0)
MONOS PCT: 5 %
Monocytes Absolute: 0.3 10*3/uL (ref 0.1–1.0)
Neutro Abs: 3.8 10*3/uL (ref 1.7–7.7)
Neutrophils Relative %: 62 %
Platelets: 137 10*3/uL — ABNORMAL LOW (ref 150–400)
RBC: 1.55 MIL/uL — AB (ref 3.87–5.11)
RDW: 17.2 % — AB (ref 11.5–15.5)
WBC: 6.1 10*3/uL (ref 4.0–10.5)

## 2017-10-09 LAB — BASIC METABOLIC PANEL
ANION GAP: 2 — AB (ref 5–15)
BUN: 20 mg/dL (ref 6–20)
CALCIUM: 7.9 mg/dL — AB (ref 8.9–10.3)
CO2: 25 mmol/L (ref 22–32)
Chloride: 111 mmol/L (ref 101–111)
Creatinine, Ser: 0.52 mg/dL (ref 0.44–1.00)
Glucose, Bld: 112 mg/dL — ABNORMAL HIGH (ref 65–99)
Potassium: 4.4 mmol/L (ref 3.5–5.1)
Sodium: 138 mmol/L (ref 135–145)

## 2017-10-09 LAB — POC OCCULT BLOOD, ED: Fecal Occult Bld: NEGATIVE

## 2017-10-09 LAB — PREPARE RBC (CROSSMATCH)

## 2017-10-09 MED ORDER — LEVOTHYROXINE SODIUM 100 MCG PO TABS
100.0000 ug | ORAL_TABLET | Freq: Every day | ORAL | Status: DC
  Administered 2017-10-10: 100 ug via ORAL
  Filled 2017-10-09: qty 1

## 2017-10-09 MED ORDER — PANTOPRAZOLE SODIUM 40 MG PO TBEC
40.0000 mg | DELAYED_RELEASE_TABLET | Freq: Every day | ORAL | Status: DC
  Administered 2017-10-10: 40 mg via ORAL
  Filled 2017-10-09: qty 1

## 2017-10-09 MED ORDER — FERROUS SULFATE 325 (65 FE) MG PO TABS
325.0000 mg | ORAL_TABLET | Freq: Two times a day (BID) | ORAL | Status: DC
  Administered 2017-10-10: 325 mg via ORAL
  Filled 2017-10-09: qty 1

## 2017-10-09 MED ORDER — POLYETHYLENE GLYCOL 3350 17 G PO PACK
17.0000 g | PACK | Freq: Every day | ORAL | Status: DC
Start: 2017-10-10 — End: 2017-10-10
  Administered 2017-10-10: 17 g via ORAL
  Filled 2017-10-09: qty 1

## 2017-10-09 MED ORDER — SENNOSIDES-DOCUSATE SODIUM 8.6-50 MG PO TABS
2.0000 | ORAL_TABLET | Freq: Two times a day (BID) | ORAL | Status: DC
  Administered 2017-10-09 – 2017-10-10 (×2): 2 via ORAL
  Filled 2017-10-09 (×2): qty 2

## 2017-10-09 MED ORDER — ONDANSETRON HCL 4 MG/2ML IJ SOLN: 4.0000 mg | Freq: Four times a day (QID) | INTRAMUSCULAR | Status: DC | PRN

## 2017-10-09 MED ORDER — SERTRALINE HCL 50 MG PO TABS
75.0000 mg | ORAL_TABLET | Freq: Every day | ORAL | Status: DC
  Administered 2017-10-10: 10:00:00 75 mg via ORAL
  Filled 2017-10-09: qty 1

## 2017-10-09 MED ORDER — IPRATROPIUM-ALBUTEROL 0.5-2.5 (3) MG/3ML IN SOLN: 3.0000 mL | Freq: Four times a day (QID) | RESPIRATORY_TRACT | Status: DC | PRN

## 2017-10-09 MED ORDER — LEVETIRACETAM 500 MG PO TABS
500.0000 mg | ORAL_TABLET | Freq: Two times a day (BID) | ORAL | Status: DC
  Administered 2017-10-09 – 2017-10-10 (×2): 500 mg via ORAL
  Filled 2017-10-09 (×2): qty 1

## 2017-10-09 MED ORDER — MIRTAZAPINE 15 MG PO TABS
15.0000 mg | ORAL_TABLET | Freq: Every day | ORAL | Status: DC
  Administered 2017-10-09: 15 mg via ORAL
  Filled 2017-10-09: qty 1

## 2017-10-09 MED ORDER — CARVEDILOL 12.5 MG PO TABS
12.5000 mg | ORAL_TABLET | Freq: Two times a day (BID) | ORAL | Status: DC
  Administered 2017-10-10: 12.5 mg via ORAL
  Filled 2017-10-09: qty 1

## 2017-10-09 MED ORDER — SODIUM CHLORIDE 0.9 % IV SOLN
Freq: Once | INTRAVENOUS | Status: AC
  Administered 2017-10-09: via INTRAVENOUS

## 2017-10-09 MED ORDER — ONDANSETRON HCL 4 MG PO TABS: 4.0000 mg | ORAL_TABLET | Freq: Four times a day (QID) | ORAL | Status: DC | PRN

## 2017-10-09 NOTE — ED Notes (Signed)
Date and time results received: 10/09/17 7:02 PM (use smartphrase ".now" to insert current time)  Test: Hgb Critical Value: 5.7  Name of Provider Notified: Tyrone Nine  Orders Received? Or Actions Taken?: Orders Received - See Orders for details

## 2017-10-09 NOTE — ED Notes (Signed)
Bed: WA02 Expected date:  Expected time:  Means of arrival:  Comments: Ems

## 2017-10-09 NOTE — ED Notes (Signed)
ED TO INPATIENT HANDOFF REPORT  Name/Age/Gender Jean Hodges 84 y.o. female  Code Status    Code Status Orders  (From admission, onward)        Start     Ordered   10/09/17 2030  Full code  Continuous     10/09/17 2031    Code Status History    Date Active Date Inactive Code Status Order ID Comments User Context   07/23/2017 1509 07/24/2017 1809 Full Code [id number]  Phillips Grout, MD Inpatient   11/27/2016 2203 11/28/2016 2154 Full Code [id number]  Rise Patience, MD Inpatient      Home/SNF/Other Nursing Home  Chief Complaint Critical labs   Level of Care/Admitting Diagnosis ED Disposition    ED Disposition Condition [place]: [facility] [517616]  Level of Care: Med-Surg [16]  Diagnosis: Symptomatic anemia [0737106]  Admitting Physician: Norval Morton [[id number]]  Attending Physician: Norval Morton [[id number]]  PT Class (Do Not Modify): Observation [104]  PT Acc Code (Do Not Modify): Observation [10022]       Medical History Past Medical History:  Diagnosis Date  . Acute encephalopathy   . Acute encephalopathy 09/30/2016  . Alzheimer's dementia without behavioral disturbance   . Alzheimer's dementia without behavioral disturbance 10/07/2016  . Anemia   . Anemia 01/28/2014  . Atherosclerotic PVD with ulceration ([place])   . CAD (coronary artery disease)   . CAD (coronary artery disease) 01/04/2014  . Cellulitis   . Cellulitis of left leg   . Diabetes mellitus without complication ([place])   . DM (diabetes mellitus) ([place]) 01/27/2014  . HTN (hypertension) 01/04/2014  . Hyperlipidemia   . Hypertension   . Hypoglycemia associated with type 2 diabetes mellitus ([place])   . Macrocytic anemia   . Seizures ([place]) 09/30/2016  . Tear of left rotator cuff   . UTI (urinary tract infection)   . UTI (urinary tract infection) 09/25/2016    Allergies Allergies  Allergen Reactions  . Acetaminophen   . Codeine Nausea Only   . Dilaudid [Hydromorphone]   . Hydrocodone     Hallucinations  . Keflex [Cephalexin] Diarrhea    IV Location/Drains/Wounds Patient Lines/Drains/Airways Status   Active Line/Drains/Airways    Name:   Placement date:   Placement time:   Site:   Days:   Peripheral IV 10/09/17 Right Arm   10/09/17    1838    Arm   less than 1          Labs/Imaging Results for orders placed or performed during the hospital encounter of 10/09/17 (from the past 48 hour(s))  CBC with Differential     Status: Abnormal   Collection Time: 10/09/17  6:33 PM  Result Value Ref Range   WBC 6.1 4.0 - 10.5 K/uL   RBC 1.55 (L) 3.87 - 5.11 MIL/uL   Hemoglobin 5.7 (LL) 12.0 - 15.0 g/dL    Comment: REPEATED TO VERIFY CRITICAL RESULT CALLED TO, READ BACK BY AND VERIFIED WITH: MEGAN BRILL,RN [id number] @ 1900 BY J SCOTTON    HCT 17.6 (L) 36.0 - 46.0 %   MCV 113.5 (H) 78.0 - 100.0 fL   MCH 36.8 (H) 26.0 - 34.0 pg   MCHC 32.4 30.0 - 36.0 g/dL   RDW 17.2 (H) 11.5 - 15.5 %   Platelets 137 (L) 150 - 400 K/uL   Neutrophils Relative % 62 %   Lymphocytes Relative 31 %   Monocytes Relative 5 %  Eosinophils Relative 2 %   Basophils Relative 0 %   Neutro Abs 3.8 1.7 - 7.7 K/uL   Lymphs Abs 1.9 0.7 - 4.0 K/uL   Monocytes Absolute 0.3 0.1 - 1.0 K/uL   Eosinophils Absolute 0.1 0.0 - 0.7 K/uL   Basophils Absolute 0.0 0.0 - 0.1 K/uL   Smear Review MORPHOLOGY UNREMARKABLE     Comment: Performed at [facility], [place] [address], [place], [place] [address]  Basic metabolic panel     Status: Abnormal   Collection Time: 10/09/17  6:33 PM  Result Value Ref Range   Sodium 138 135 - 145 mmol/L   Potassium 4.4 3.5 - 5.1 mmol/L   Chloride 111 101 - 111 mmol/L   CO2 25 22 - 32 mmol/L   Glucose, Bld 112 (H) 65 - 99 mg/dL   BUN 20 6 - 20 mg/dL   Creatinine, Ser 0.52 0.44 - 1.00 mg/dL   Calcium 7.9 (L) 8.9 - 10.3 mg/dL   GFR calc non Af Amer >60 >60 mL/min   GFR calc Af Amer >60 >60 mL/min    Comment:  (NOTE) The eGFR has been calculated using the CKD EPI equation. This calculation has not been validated in all clinical situations. eGFR's persistently <60 mL/min signify possible Chronic Kidney Disease.    Anion gap 2 (L) 5 - 15    Comment: REPEATED TO VERIFY Performed at [facility] [address] [place], [place] [address]   I-Stat Chem 8, ED     Status: Abnormal   Collection Time: 10/09/17  6:41 PM  Result Value Ref Range   Sodium 139 135 - 145 mmol/L   Potassium 4.5 3.5 - 5.1 mmol/L   Chloride 105 101 - 111 mmol/L   BUN 19 6 - 20 mg/dL   Creatinine, Ser 0.60 0.44 - 1.00 mg/dL   Glucose, Bld 121 (H) 65 - 99 mg/dL   Calcium, Ion 1.25 1.15 - 1.40 mmol/L   TCO2 25 22 - 32 mmol/L   Hemoglobin 7.1 (L) 12.0 - 15.0 g/dL   HCT 21.0 (L) 36.0 - 46.0 %  POC occult blood, ED Provider will collect     Status: None   Collection Time: 10/09/17  7:17 PM  Result Value Ref Range   Fecal Occult Bld NEGATIVE NEGATIVE   No results found.  Pending Labs Unresulted Labs (From admission, onward)   Start     Ordered   10/10/17 0500  CBC  Tomorrow morning,   R     10/09/17 2031   10/10/17 3154  Basic metabolic panel  Tomorrow morning,   R     10/09/17 2031   10/09/17 1903  Type and screen  STAT,   STAT     10/09/17 1902   10/09/17 1903  Prepare RBC  (Adult Blood Administration - Red Blood Cells)  Once,   R    Question Answer Comment  # of Units 2 units   Transfusion Indications Actively Bleeding / GI Bleed   If emergent release call blood bank Not emergent release      10/09/17 1903      Vitals/Pain Today's Vitals   10/09/17 1830 10/09/17 1900 10/09/17 1934 10/09/17 2018  BP: (!) 144/50 (!) 142/54 (!) 147/49 134/68  Pulse: 78 79 (!) 56 79  Resp: 18 16 18 16   Temp:   98.3 F (36.8 C) 98 F (36.7 C)  TempSrc:   Oral Oral  SpO2: 100% 100% 100% 100%  Weight:      Height:      PainSc:        Isolation Precautions No active  isolations  Medications Medications  0.9 %  sodium chloride infusion (has no administration in time range)  carvedilol (COREG) tablet 12.5 mg (has no administration in time range)  ferrous sulfate tablet 325 mg (has no administration in time range)  levETIRAcetam (KEPPRA) tablet 500 mg (has no administration in time range)  levothyroxine (SYNTHROID, LEVOTHROID) tablet 100 mcg (has no administration in time range)  mirtazapine (REMERON) tablet 15 mg (has no administration in time range)  pantoprazole (PROTONIX) EC tablet 40 mg (has no administration in time range)  sertraline (ZOLOFT) tablet 75 mg (has no administration in time range)  polyethylene glycol (MIRALAX / GLYCOLAX) packet 17 g (has no administration in time range)  sennosides-docusate sodium (SENOKOT-S) 8.6-50 MG tablet 2 tablet (has no administration in time range)  ondansetron (ZOFRAN) tablet 4 mg (has no administration in time range)    Or  ondansetron (ZOFRAN) injection 4 mg (has no administration in time range)  ipratropium-albuterol (DUONEB) 0.5-2.5 (3) MG/3ML nebulizer solution 3 mL (has no administration in time range)    Mobility non-ambulatory

## 2017-10-09 NOTE — H&P (Addendum)
History and Physical    Jean Hodges QDU:[id number] DOB: 1933-08-06 DOA: 10/09/2017  Referring MD/NP/PA: Dr. Deno Etienne PCP: Patient, No Pcp Per  Patient coming from: Skilled nursing facility via EMS  Chief Complaint: Abnormal lab work  I have personally briefly reviewed patient's old medical records in [place]   HPI: Jean Hodges is a 84 y.o. female with medical history significant of chronic anemia requiring blood transfusions, HTN, dementia, PVD, CAD, s/p biventricular implantable cardioverter defibrillator, seizure disorder, and hypothyroidism; who presented from skilled nursing facility after lab work revealed hemoglobin of 5.4 g/dL.  Patient has dementia and therefore history is limited.  She reports feeling fatigued.,  but denies any complaints of chest pain, shortness of breath, abdominal pain, or blood in stool/urine.  Nursing staff gave no reports of any other symptoms.  Review of records shows that patient has required frequent blood transfusions every couple of months.  Previously evaluated by [facility] hematology back in 2017, and thought to have myelodysplasia as patient declined bone marrow biopsy for further evaluation.   ED Course: Upon admission into the emergency department patient was noted to be afebrile, heart rates 56-82, respirations 16-18, blood pressure 133/55-147/49, and O2 saturations maintained on room air.  Labs reviewed hemoglobin 5.7, platelets 137, and calcium 7.9.  Stool guaiac negative.  Patient son was called and took a transfusion of 2 units of packed red blood cells.  Elderly female in  Review of Systems  Unable to perform ROS: Dementia  Constitutional: Positive for malaise/fatigue.  Endo/Heme/Allergies: Bruises/bleeds easily.    Past Medical History:  Diagnosis Date  . Acute encephalopathy   . Acute encephalopathy 09/30/2016  . Alzheimer's dementia without behavioral disturbance   . Alzheimer's dementia without behavioral disturbance 10/07/2016  .  Anemia   . Anemia 01/28/2014  . Atherosclerotic PVD with ulceration ([place])   . CAD (coronary artery disease)   . CAD (coronary artery disease) 01/04/2014  . Cellulitis   . Cellulitis of left leg   . Diabetes mellitus without complication ([place])   . DM (diabetes mellitus) ([place]) 01/27/2014  . HTN (hypertension) 01/04/2014  . Hyperlipidemia   . Hypertension   . Hypoglycemia associated with type 2 diabetes mellitus ([place])   . Macrocytic anemia   . Seizures ([place]) 09/30/2016  . Tear of left rotator cuff   . UTI (urinary tract infection)   . UTI (urinary tract infection) 09/25/2016    History reviewed. No pertinent surgical history.   reports that she has never smoked. She has never used smokeless tobacco. She reports that she does not drink alcohol or use drugs.  Allergies  Allergen Reactions  . Acetaminophen   . Codeine Nausea Only  . Dilaudid [Hydromorphone]   . Hydrocodone     Hallucinations  . Keflex [Cephalexin] Diarrhea    Family History  Family history unknown: Yes    Prior to Admission medications   Medication Sig Start Date End Date Taking? Authorizing Provider  carvedilol (COREG) 12.5 MG tablet Take 12.5 mg by mouth 2 (two) times daily with a meal.   Yes [provider]  cholecalciferol (VITAMIN D) 1000 units tablet Take 2,000 Units by mouth daily.   Yes [provider]  ferrous sulfate 325 (65 FE) MG tablet Take 325 mg by mouth 2 (two) times daily with a meal.   Yes [provider]  levETIRAcetam (KEPPRA) 500 MG tablet Take 500 mg by mouth 2 (two) times daily.   Yes [provider]  levothyroxine (SYNTHROID,  LEVOTHROID) 100 MCG tablet Take 100 mcg by mouth daily before breakfast.   Yes [provider]  mirtazapine (REMERON) 15 MG tablet Take 15 mg by mouth at bedtime.   Yes [provider]  Multiple Vitamins-Minerals (DECUBI-VITE PO) Take 1 capsule by mouth daily.   Yes [provider]  pantoprazole  (PROTONIX) 40 MG tablet Take 1 tablet (40 mg total) by mouth daily. 07/25/17  Yes Barton Dubois, MD  polyethylene glycol [facility] / GLYCOLAX) packet Take 17 g by mouth daily.   Yes [provider]  sennosides-docusate sodium (SENOKOT-S) 8.6-50 MG tablet Take 2 tablets by mouth 2 (two) times daily.   Yes [provider]  sertraline (ZOLOFT) 50 MG tablet Take 75 mg by mouth daily.   Yes [provider]  simvastatin (ZOCOR) 10 MG tablet Take 10 mg by mouth daily.    [provider]    Physical Exam:  Constitutional: Elderly female in NAD, calm, comfortable Vitals:   10/09/17 1802 10/09/17 1830 10/09/17 1900 10/09/17 1934  BP: (!) 133/55 (!) 144/50 (!) 142/54 (!) 147/49  Pulse: 78 78 79 (!) 56  Resp: _0 Temp:    98.3 F (36.8 C)  TempSrc:    Oral  SpO2: 100% 100% 100% 100%  Weight:      Height:       Eyes: PERRL, lids and conjunctivae normal ENMT: Mucous membranes are moist. Posterior pharynx clear of any exudate or lesions. Edentulous. Neck: normal, supple, no masses, no thyromegaly Respiratory: clear to auscultation bilaterally, no wheezing, no crackles. Normal respiratory effort. No accessory muscle use.  Cardiovascular: Regular rate and rhythm, no murmurs / rubs / gallops. No extremity edema. 2+ pedal pulses. No carotid bruits.  Abdomen: no tenderness, no masses palpated. No hepatosplenomegaly. Bowel sounds positive.  Musculoskeletal: no clubbing / cyanosis.  Below knee amputation of the right leg . Good ROM, no contractures. Normal muscle tone.  Skin: Pallor present.  Bruising of the bilateral upper extremities noted with skin tears present. Neurologic: CN 2-12 grossly intact. Sensation intact, DTR normal. Strength 5/5 in all 4.  Psychiatric: Demented.  Alert and oriented only to person.    Labs on Admission: I have personally reviewed following labs and imaging studies  CBC: Recent Labs  Lab 10/09/17 1833 10/09/17 1841  WBC 6.1   --   NEUTROABS 3.8  --   HGB 5.7* 7.1*  HCT 17.6* 21.0*  MCV 113.5*  --   PLT 137*  --    Basic Metabolic Panel: Recent Labs  Lab 10/09/17 1833 10/09/17 1841  NA 138 139  K 4.4 4.5  CL 111 105  CO2 25  --   GLUCOSE 112* 121*  BUN 20 19  CREATININE 0.52 0.60  CALCIUM 7.9*  --    GFR: Estimated Creatinine Clearance: 43.6 mL/min (by C-G formula based on SCr of 0.6 mg/dL). Liver Function Tests: No results for input(s): AST, ALT, ALKPHOS, BILITOT, PROT, ALBUMIN in the last 168 hours. No results for input(s): LIPASE, AMYLASE in the last 168 hours. No results for input(s): AMMONIA in the last 168 hours. Coagulation Profile: No results for input(s): INR, PROTIME in the last 168 hours. Cardiac Enzymes: No results for input(s): CKTOTAL, CKMB, CKMBINDEX, TROPONINI in the last 168 hours. BNP (last 3 results) No results for input(s): PROBNP in the last 8760 hours. HbA1C: No results for input(s): HGBA1C in the last 72 hours. CBG: No results for input(s): GLUCAP in the last 168 hours.  Lipid Profile: No results for input(s): CHOL, HDL, LDLCALC, TRIG, CHOLHDL, LDLDIRECT in the last 72 hours. Thyroid Function Tests: No results for input(s): TSH, T4TOTAL, FREET4, T3FREE, THYROIDAB in the last 72 hours. Anemia Panel: No results for input(s): VITAMINB12, FOLATE, FERRITIN, TIBC, IRON, RETICCTPCT in the last 72 hours. Urine analysis: No results found for: COLORURINE, APPEARANCEUR, LABSPEC, PHURINE, GLUCOSEU, HGBUR, BILIRUBINUR, KETONESUR, PROTEINUR, UROBILINOGEN, NITRITE, LEUKOCYTESUR Sepsis Labs: No results found for this or any previous visit (from the past 240 hour(s)).   Radiological Exams on Admission: No results found.    Assessment/Plan Symptomatic anemia: Acute on chronic.  Patient's baseline hemoglobin ranges from 8-9, but was found to have hemoglobin of 5.7 on admission.  Stool guaiacs negative.  No reported bleeding episodes. Previously worked up by hematology at [facility]  thought to most likely have myelodysplasia in 2017.  Review of records shows patient requiring blood transfusions every couple of months. - Admit to MedSurg bed - Continue with transfusion of 2 units of packed red blood cells - Continue ferrous sulfate - Recheck CBC in a.m. following blood transfusions  Thrombocytopenia: Acute on chronic.  Platelet count noted to be 137 on admission. - Continue to monitor  Paroxysmal atrial fibrillation, Status post biventricular cardioverter defibrillator: Stable.  Patient with paced rhythm.  Essential hypertension - Continue Coreg  Alzheimer dementia: Patient only oriented to person.  Chronic depression - Continue Zoloft  Hypothyroidism - Continue levothyroxine  Right BKA stable:  Seizure disorder - Continue Keppra   Hyperlipidemia - Continue simvastatin   Diabetes mellitus type 2: not on any medications.  DVT prophylaxis: scds  Code Status: Full Family Communication: No family present at bedside Disposition Plan: likely discharge back to skilled nursing facility once transfusion complete Consults called: none Admission status: observation  Norval Morton MD [facility] Pager [phone]   If 7PM-7AM, please contact night-coverage [email or web address] Password [facility]  10/09/2017, 8:14 PM

## 2017-10-09 NOTE — ED Provider Notes (Signed)
[facility] DEPT Provider Note   CSN: [id number] Arrival date & time: 10/09/17  1730     History   Chief Complaint Chief Complaint  Patient presents with  . Abnormal Lab    HPI Jean Hodges is a 84 y.o. female.  84 yo F with a chief complaint of a low hemoglobin level.  The patient is demented and cannot provide much further history.  She denies any chest pain shortness of breath abdominal pain vomiting dark stool, blood thinner use or history of GI bleed.  Level 5 caveat dementia.  The history is provided by the patient.  Abnormal Lab  Illness  This is a new problem. The current episode started yesterday. The problem occurs constantly. The problem has not changed since onset.Pertinent negatives include no chest pain, no headaches and no shortness of breath. Nothing aggravates the symptoms. Nothing relieves the symptoms. She has tried nothing for the symptoms. The treatment provided no relief.    Past Medical History:  Diagnosis Date  . Acute encephalopathy   . Acute encephalopathy 09/30/2016  . Alzheimer's dementia without behavioral disturbance   . Alzheimer's dementia without behavioral disturbance 10/07/2016  . Anemia   . Anemia 01/28/2014  . Atherosclerotic PVD with ulceration ([place])   . CAD (coronary artery disease)   . CAD (coronary artery disease) 01/04/2014  . Cellulitis   . Cellulitis of left leg   . Diabetes mellitus without complication ([place])   . DM (diabetes mellitus) ([place]) 01/27/2014  . HTN (hypertension) 01/04/2014  . Hyperlipidemia   . Hypertension   . Hypoglycemia associated with type 2 diabetes mellitus ([place])   . Macrocytic anemia   . Seizures ([place]) 09/30/2016  . Tear of left rotator cuff   . UTI (urinary tract infection)   . UTI (urinary tract infection) 09/25/2016    Patient Active Problem List   Diagnosis Date Noted  . Anemia 07/23/2017  . Symptomatic anemia 11/27/2016  . Tonic-clonic seizure disorder ([place])  10/09/2016  . Oropharyngeal dysphagia 10/09/2016  . Moderate protein-calorie malnutrition ([place]) 10/09/2016  . Hypothyroidism (acquired) 10/09/2016  . Type 2 diabetes mellitus with peripheral vascular disease ([place]) 10/09/2016  . PVD (peripheral vascular disease) ([place]) 10/09/2016  . Essential hypertension 10/09/2016  . Alzheimer disease 10/09/2016  . Chronic depression 10/09/2016  . Hyperlipidemia 10/09/2016  . Coronary artery disease involving native coronary artery of native heart without angina pectoris 10/09/2016    History reviewed. No pertinent surgical history.   OB History   None      Home Medications    Prior to Admission medications   Medication Sig Start Date End Date Taking? Authorizing Provider  carvedilol (COREG) 12.5 MG tablet Take 12.5 mg by mouth 2 (two) times daily with a meal.   Yes [provider]  cholecalciferol (VITAMIN D) 1000 units tablet Take 2,000 Units by mouth daily.   Yes [provider]  ferrous sulfate 325 (65 FE) MG tablet Take 325 mg by mouth 2 (two) times daily with a meal.   Yes [provider]  levETIRAcetam (KEPPRA) 500 MG tablet Take 500 mg by mouth 2 (two) times daily.   Yes [provider]  levothyroxine (SYNTHROID, LEVOTHROID) 100 MCG tablet Take 100 mcg by mouth daily before breakfast.   Yes [provider]  mirtazapine (REMERON) 15 MG tablet Take 15 mg by mouth at bedtime.   Yes [provider]  Multiple Vitamins-Minerals (DECUBI-VITE PO) Take 1 capsule by mouth daily.   Yes [provider]  pantoprazole (PROTONIX) 40 MG tablet Take 1 tablet (40 mg total) by mouth daily. 07/25/17  Yes Barton Dubois, MD  polyethylene glycol [facility] / GLYCOLAX) packet Take 17 g by mouth daily.   Yes [provider]  sennosides-docusate sodium (SENOKOT-S) 8.6-50 MG tablet Take 2 tablets by mouth 2 (two) times daily.   Yes [provider]  sertraline (ZOLOFT) 50 MG tablet Take 75  mg by mouth daily.   Yes [provider]  simvastatin (ZOCOR) 10 MG tablet Take 10 mg by mouth daily.    [provider]    Family History Family History  Family history unknown: Yes    Social History Social History   Tobacco Use  . Smoking status: Never Smoker  . Smokeless tobacco: Never Used  Substance Use Topics  . Alcohol use: No  . Drug use: No     Allergies   Acetaminophen; Codeine; Dilaudid [hydromorphone]; Hydrocodone; and Keflex [cephalexin]   Review of Systems Review of Systems  Unable to perform ROS: Dementia  Constitutional: Negative for chills and fever.  HENT: Negative for congestion and rhinorrhea.   Eyes: Negative for redness and visual disturbance.  Respiratory: Negative for shortness of breath and wheezing.   Cardiovascular: Negative for chest pain and palpitations.  Gastrointestinal: Negative for nausea and vomiting.  Genitourinary: Negative for dysuria and urgency.  Musculoskeletal: Negative for arthralgias and myalgias.  Skin: Negative for pallor and wound.  Neurological: Negative for dizziness and headaches.     Physical Exam Updated Vital Signs BP (!) 147/49 (BP Location: Left Arm)   Pulse (!) 56   Temp 98.3 F (36.8 C) (Oral)   Resp 18   Ht 5\' 2"  (1.575 m)   Wt 51.7 kg (114 lb)   SpO2 100%   BMI 20.85 kg/m   Physical Exam  Constitutional: She is oriented to person, place, and time. She appears well-developed and well-nourished. No distress.  Pale on exam, chronically ill frail  HENT:  Head: Normocephalic and atraumatic.  Eyes: Pupils are equal, round, and reactive to light. EOM are normal.  Neck: Normal range of motion. Neck supple.  Cardiovascular: Normal rate and regular rhythm. Exam reveals no gallop and no friction rub.  No murmur heard. Pulmonary/Chest: Effort normal. She has no wheezes. She has no rales.  Abdominal: Soft. She exhibits no distension. There is no tenderness.  Genitourinary:  Genitourinary  Comments: Greenish/dark stool, no gross blood, no hemorrhoids.   Musculoskeletal: She exhibits no edema or tenderness.  Neurological: She is alert and oriented to person, place, and time.  Skin: Skin is warm and dry. She is not diaphoretic.  Psychiatric: She has a normal mood and affect. Her behavior is normal.  Nursing note and vitals reviewed.    ED Treatments / Results  Labs (all labs ordered are listed, but only abnormal results are displayed) Labs Reviewed  CBC WITH DIFFERENTIAL/PLATELET - Abnormal; Notable for the following components:      Result Value   RBC 1.55 (*)    Hemoglobin 5.7 (*)    HCT 17.6 (*)    MCV 113.5 (*)    MCH 36.8 (*)    RDW 17.2 (*)    Platelets 137 (*)    All other components within normal limits  BASIC METABOLIC PANEL - Abnormal; Notable for the following components:   Glucose, Bld 112 (*)    Calcium 7.9 (*)    Anion gap 2 (*)    All other components within normal  limits  I-STAT CHEM 8, ED - Abnormal; Notable for the following components:   Glucose, Bld 121 (*)    Hemoglobin 7.1 (*)    HCT 21.0 (*)    All other components within normal limits  POC OCCULT BLOOD, ED  TYPE AND SCREEN  PREPARE RBC (CROSSMATCH)    EKG None  Radiology No results found.  Procedures Procedures (including critical care time) Procedure note: Ultrasound Guided Peripheral IV Ultrasound guided peripheral 1.88 inch angiocath IV placement performed by me. Indications: Nursing unable to place IV. Details: The antecubital fossa and upper arm were evaluated with a multifrequency linear probe. Patent brachial veins were noted. 1 attempt was made to cannulate a vein under realtime US guidance with successful cannulation of the vein and catheter placement. There is return of non-pulsatile dark red blood. The patient tolerated the procedure well without complications. Images archived electronically.  CPT codes: [phone] and [phone]  Medications Ordered in ED Medications  0.9 %   sodium chloride infusion (has no administration in time range)     Initial Impression / Assessment and Plan / ED Course  I have reviewed the triage vital signs and the nursing notes.  Pertinent labs & imaging results that were available during my care of the patient were reviewed by me and considered in my medical decision making (see chart for details).     84 yo F with a chief complaint of a low hemoglobin.  She was sent from her nursing home with a hemoglobin of 5.6.  Sent for blood transfusion.  Patient is demented at baseline unable to provide much further history.  She does have dark stools on my exam.  Will repeat labs.   Hemoglobin found to be 5.7.  In order 2 units to transfuse.  Rectal exam with dark stool but heme negative.  Seems more like chronic anemia.  Discussed with hospitalist for admission.  CRITICAL CARE Performed by: Cecilio Asper   Total critical care time: 35 minutes  Critical care time was exclusive of separately billable procedures and treating other patients.  Critical care was necessary to treat or prevent imminent or life-threatening deterioration.  Critical care was time spent personally by me on the following activities: development of treatment plan with patient and/or surrogate as well as nursing, discussions with consultants, evaluation of patient's response to treatment, examination of patient, obtaining history from patient or surrogate, ordering and performing treatments and interventions, ordering and review of laboratory studies, ordering and review of radiographic studies, pulse oximetry and re-evaluation of patient's condition.  The patients results and plan were reviewed and discussed.   Any x-rays performed were independently reviewed by myself.   Differential diagnosis were considered with the presenting HPI.  Medications  0.9 %  sodium chloride infusion (has no administration in time range)    Vitals:   10/09/17 1802 10/09/17  1830 10/09/17 1900 10/09/17 1934  BP: (!) 133/55 (!) 144/50 (!) 142/54 (!) 147/49  Pulse: 78 78 79 (!) 56  Resp: 18 18 16 18   Temp:    98.3 F (36.8 C)  TempSrc:    Oral  SpO2: 100% 100% 100% 100%  Weight:      Height:        Final diagnoses:  Symptomatic anemia    Admission/ observation were discussed with the admitting physician, patient and/or family and they are comfortable with the plan.    Final Clinical Impressions(s) / ED Diagnoses   Final diagnoses:  Symptomatic anemia  ED Discharge Orders    None       Deno Etienne, [place] 10/09/17 2021

## 2017-10-09 NOTE — ED Triage Notes (Signed)
From 3/28 labs Hgb 5.4 creat

## 2017-10-09 NOTE — ED Notes (Signed)
Spoke with son Delfino Lovett, who gave verbal consent for blood transfusion.

## 2017-10-10 DIAGNOSIS — D539 Nutritional anemia, unspecified: Secondary | ICD-10-CM | POA: Diagnosis not present

## 2017-10-10 DIAGNOSIS — E039 Hypothyroidism, unspecified: Secondary | ICD-10-CM | POA: Diagnosis not present

## 2017-10-10 DIAGNOSIS — G309 Alzheimer's disease, unspecified: Secondary | ICD-10-CM | POA: Diagnosis not present

## 2017-10-10 DIAGNOSIS — G40409 Other generalized epilepsy and epileptic syndromes, not intractable, without status epilepticus: Secondary | ICD-10-CM | POA: Diagnosis not present

## 2017-10-10 DIAGNOSIS — F0280 Dementia in other diseases classified elsewhere without behavioral disturbance: Secondary | ICD-10-CM | POA: Diagnosis not present

## 2017-10-10 DIAGNOSIS — E43 Unspecified severe protein-calorie malnutrition: Secondary | ICD-10-CM | POA: Diagnosis not present

## 2017-10-10 DIAGNOSIS — I10 Essential (primary) hypertension: Secondary | ICD-10-CM | POA: Diagnosis not present

## 2017-10-10 DIAGNOSIS — D649 Anemia, unspecified: Secondary | ICD-10-CM | POA: Diagnosis not present

## 2017-10-10 LAB — CBC
HEMATOCRIT: 28.6 % — AB (ref 36.0–46.0)
Hemoglobin: 9.5 g/dL — ABNORMAL LOW (ref 12.0–15.0)
MCH: 32.8 pg (ref 26.0–34.0)
MCHC: 33.2 g/dL (ref 30.0–36.0)
MCV: 98.6 fL (ref 78.0–100.0)
PLATELETS: 132 10*3/uL — AB (ref 150–400)
RBC: 2.9 MIL/uL — ABNORMAL LOW (ref 3.87–5.11)
RDW: 22.2 % — AB (ref 11.5–15.5)
WBC: 8.6 10*3/uL (ref 4.0–10.5)

## 2017-10-10 LAB — MRSA PCR SCREENING: MRSA BY PCR: POSITIVE — AB

## 2017-10-10 LAB — BASIC METABOLIC PANEL
ANION GAP: 8 (ref 5–15)
BUN: 23 mg/dL — ABNORMAL HIGH (ref 6–20)
CO2: 25 mmol/L (ref 22–32)
Calcium: 8.8 mg/dL — ABNORMAL LOW (ref 8.9–10.3)
Chloride: 106 mmol/L (ref 101–111)
Creatinine, Ser: 0.55 mg/dL (ref 0.44–1.00)
Glucose, Bld: 146 mg/dL — ABNORMAL HIGH (ref 65–99)
Potassium: 4.8 mmol/L (ref 3.5–5.1)
Sodium: 139 mmol/L (ref 135–145)

## 2017-10-10 MED ORDER — ENSURE ENLIVE PO LIQD: 237.0000 mL | Freq: Three times a day (TID) | ORAL | 12 refills | Status: DC

## 2017-10-10 MED ORDER — ADULT MULTIVITAMIN W/MINERALS CH: 1.0000 | ORAL_TABLET | Freq: Every day | ORAL | Status: DC

## 2017-10-10 MED ORDER — MUPIROCIN 2 % EX OINT
1.0000 "application " | TOPICAL_OINTMENT | Freq: Two times a day (BID) | CUTANEOUS | Status: DC
  Administered 2017-10-10: 1 via NASAL
  Filled 2017-10-10: qty 22

## 2017-10-10 MED ORDER — MUPIROCIN 2 % EX OINT: 1.0000 "application " | TOPICAL_OINTMENT | Freq: Two times a day (BID) | CUTANEOUS | 0 refills | Status: DC

## 2017-10-10 MED ORDER — ENSURE ENLIVE PO LIQD: 237.0000 mL | Freq: Three times a day (TID) | ORAL | Status: DC

## 2017-10-10 MED ORDER — CHLORHEXIDINE GLUCONATE CLOTH 2 % EX PADS
6.0000 | MEDICATED_PAD | Freq: Every day | CUTANEOUS | Status: DC
  Administered 2017-10-10: 6 via TOPICAL

## 2017-10-10 NOTE — Progress Notes (Signed)
Initial Nutrition Assessment  DOCUMENTATION CODES:   Severe malnutrition in context of chronic illness  INTERVENTION:   - Ensure Enlive po BID, each supplement provides 350 kcal and 20 grams of protein  - MVI with minerals daily  - Encourage PO intake  NUTRITION DIAGNOSIS:   Severe Malnutrition related to chronic illness(dementia) as evidenced by severe muscle depletion, severe fat depletion, percent weight loss(6.4% weight loss in 11 weeks).  GOAL:   Patient will meet greater than or equal to 90% of their needs  MONITOR:   PO intake, Supplement acceptance, Labs, Weight trends  REASON FOR ASSESSMENT:   Malnutrition Screening Tool    ASSESSMENT:   84 year old female who presented to the ED with a chief complaint of a low hemoglobin level. Pt with PMH significant for chronic anemia requiring blood transfusions, dementia, right BKA, CAD, diabetes mellitus, HTN, and HLD. Pt given a transfusion of 2 units of PRBC.  Spoke with pt at bedside. Pt pleasant and able to answer some questions but not all. No family in room at time of visit. Pt reports having a "good" appetite and eating 2-3 meals daily PTA. Pt unable to elaborate on what a typical meal includes.  Pt with unfinished breakfast try in room at time of visit. Pt had completed 100% of oatmeal, 50% of scrambled eggs, and a few sips of milk. Pt stated she was full. RD encouraged pt to have more of her breakfast if she felt like she could.  Pt reports drinking 1 Ensure daily PTA and is agreeable to RD ordering Ensure Enlive during current admission. RD to order MVI with minerals.  Pt reports her UBW as 120 lbs but is unsure when she last weighed that. Pt endorses weight loss. Per weight history in chart, pt has lost 7 lbs in the past 11 weeks. This is a 6.4% weight loss which is significant for timeframe.  Medications reviewed and include: 325 mg ferrous sulfate BID with meals, 100 mcg levothyroxine daily, 15 mg Remeron daily,  40 mg Protonix daily, Miralax daily, Senokot-S daily  Labs reviewed: hemoglobin 7.1, HCT 21.0  NUTRITION - FOCUSED PHYSICAL EXAM:    Most Recent Value  Orbital Region  Moderate depletion  Upper Arm Region  Severe depletion  Thoracic and Lumbar Region  Severe depletion  Buccal Region  Moderate depletion  Temple Region  Severe depletion  Clavicle Bone Region  Severe depletion  Clavicle and Acromion Bone Region  Severe depletion  Scapular Bone Region  Severe depletion  Dorsal Hand  Moderate depletion  Patellar Region  Severe depletion  Anterior Thigh Region  Severe depletion  Posterior Calf Region  Severe depletion  Edema (RD Assessment)  None  Hair  Reviewed  Eyes  Reviewed  Mouth  Reviewed  Skin  Reviewed  Nails  Reviewed       Diet Order:  Diet Heart Room service appropriate? Yes; Fluid consistency: Thin  EDUCATION NEEDS:   No education needs have been identified at this time  Skin:  Skin Assessment: Skin Integrity Issues: Skin Integrity Issues:: Other (Comment) Other: skin tear to L hand and L upper arm  Last BM:  unknown  Height:   Ht Readings from Last 1 Encounters:  10/09/17 5\' 2"  (1.575 m)    Weight:   Wt Readings from Last 1 Encounters:  10/09/17 106 lb 14.8 oz (48.5 kg)    Ideal Body Weight:  47.1 kg  BMI:  Body mass index is 19.56 kg/m.  Estimated Nutritional Needs:  Kcal:  1400-1600 (29-33 kcal/kg)  Protein:  60-75 grams/day (1.2-1.5 g/kg)  Fluid:  1.4-1.6 L/day    Gaynell Face, MS, RD, LDN Pager: [phone] Weekend/After Hours: [phone]

## 2017-10-10 NOTE — Progress Notes (Signed)
Patient will have lunch before she transports. PTAR arranged for 2:00 pick up.   Kathrin Greathouse, Latanya Presser, MSW Clinical Social Worker  [phone] 10/10/2017  1:11 PM

## 2017-10-10 NOTE — Clinical Social Work Note (Signed)
Clinical Social Work Assessment  Patient Details  Name: Jean Hodges MRN: [id number] Date of Birth: 08-03-1933  Date of referral:  10/10/17               Reason for consult:  Facility Placement                Permission sought to share information with:  Family Supports Permission granted to share information::  Yes, Verbal Permission Granted  Name::        Agency::  [name]  Relationship::   Son   Contact Information:     Housing/Transportation Living arrangements for the past 2 months:  [place] of Information:  Patient Patient Interpreter Needed:  None Criminal Activity/Legal Involvement Pertinent to Current Situation/Hospitalization:  No - Comment as needed Significant Relationships:  Adult Children Lives with:  Facility Resident Do you feel safe going back to the place where you live? Yes Need for family participation in patient care:  Yes, patient has dementia.   Care giving concerns:   Patient is a Long term patient at [facility] skilled nursing facility. The patient admitted for abnormal lab work-Symptomatic Anemia.  Patient given transfusion.    Social Worker assessment / plan:  CSW assisting with the patient discharge back to [name] Facility representative Jean Hodges) states they will not need new FL2 since her admission was less than 24 hours. CSW called and informed son of discharge.   Plan: back to SNF  Employment status:   None Insurance information:  Medicare PT Recommendations:  [place] / Referral to community resources:  [place]  Patient/Family's Response to care:  Agreeable and Responding well to care.   Patient/Family's Understanding of and Emotional Response to Diagnosis, Current Treatment, and Prognosis: Patient has dementia. Patient son very involved in patient care and understands her diagnosis and current treatment.   Emotional Assessment Appearance:  Appears stated  age Attitude/Demeanor/Rapport:    Affect (typically observed):  Accepting, Pleasant Orientation:  Oriented to Self(Dementia) Alcohol / Substance use:  Not Applicable Psych involvement (Current and /or in the community):  No (Comment)  Discharge Needs  Concerns to be addressed:  Discharge Planning Concerns Readmission within the last 30 days:  No Current discharge risk:  None Barriers to Discharge:  No Barriers Identified   [name], LCSW 10/10/2017, 12:16 PM

## 2017-10-10 NOTE — Discharge Instructions (Signed)
[facility] Stay Proper nutrition can help your body recover from illness and injury.   Foods and beverages high in protein, vitamins, and minerals help rebuild muscle loss, promote healing, & reduce fall risk.   In addition to eating healthy foods, a nutrition shake is an easy, delicious way to get the nutrition you need during and after your hospital stay  It is recommended that you continue to drink 2 bottles per day of:       Ensure Enlive or Boost Plus for at least 1 month (30 days) after your hospital stay   Tips for adding a nutrition shake into your routine: As allowed, drink one with vitamins or medications instead of water or juice Enjoy one as a tasty mid-morning or afternoon snack Drink cold or make a milkshake out of it Drink one instead of milk with cereal or snacks Use as a coffee creamer   Available at the following grocery stores and pharmacies:           * [place] [phone]            For COUPONS visit: www.ensure.com/join or http://dawson-may.com/   Suggested Substitutions Ensure Plus = Boost Plus = Carnation Breakfast Essentials = Boost Compact Ensure Active Clear = Boost Breeze Glucerna Shake = Boost Glucose Control = Carnation Breakfast Essentials SUGAR FREE

## 2017-10-10 NOTE — Discharge Summary (Signed)
Physician Discharge Summary  Jean Hodges MLJ:[id number] DOB: 10-25-1933 DOA: 10/09/2017  PCP: Patient, No Pcp Per  Admit date: 10/09/2017 Discharge date: 10/10/2017  Admitted From: SNF Disposition:  SNF  Recommendations for Outpatient Follow-up:  1. Follow up with PCP in 1-2 weeks 2. Please obtain BMP/CBC in one week  Discharge Condition: stable CODE STATUS: Full Diet recommendation: Regular  Brief/Interim Summary: Jean Hodges is a 84 y.o. female with medical history significant of chronic anemia requiring blood transfusions, HTN, dementia, PVD, CAD, s/p biventricular implantable cardioverter defibrillator, seizure disorder, and hypothyroidism; who was admitted on 10/09/17 when she presented from skilled nursing facility after lab work revealed hemoglobin of 5.4 g/dL.  Patient has dementia. She denied any complaints of chest pain, shortness of breath, abdominal pain, or blood in stool/urine. Review of records shows that patient has required frequent blood transfusions every couple of months.  Previously evaluated by [facility] hematology back in 2017, and thought to have myelodysplasia as patient declined bone marrow biopsy for further evaluation.   Upon admission into the emergency department patient was noted to be afebrile, heart rates 56-82, respirations 16-18, blood pressure 133/55-147/49, and O2 saturations maintained on room air. Hemoglobin 5.7, platelets 137, and calcium 7.9.   She was given 2 units PRBC. Her hemoglobin improved to 9.5.  Her platelet count is 132.  On reviewing her medical records in the past her platelet count dropped all the way to 93, she likely has chronic thrombocytopenia.  Otherwise she appears to be stable at this time and will be discharged back to the skilled nursing facility.  Discharge Diagnoses:  Principal Problem:   Symptomatic anemia Active Problems:   Tonic-clonic seizure disorder (HCC)   Hypothyroidism (acquired)   Essential hypertension   Alzheimer  disease   Protein-calorie malnutrition, severe  Discharge Instructions Patient improved after blood transfusion.  She is being discharged to the skilled nursing facility.  However, because of her complex medical problems she is a high risk for hospitalization.  Allergies as of 10/10/2017      Reactions   Acetaminophen    Codeine Nausea Only   Dilaudid [hydromorphone]    Hydrocodone    Hallucinations   Keflex [cephalexin] Diarrhea      Medication List    TAKE these medications   carvedilol 12.5 MG tablet Commonly known as:  COREG Take 12.5 mg by mouth 2 (two) times daily with a meal.   cholecalciferol 1000 units tablet Commonly known as:  VITAMIN D Take 2,000 Units by mouth daily.   DECUBI-VITE PO Take 1 capsule by mouth daily.   feeding supplement (ENSURE ENLIVE) Liqd Take 237 mLs by mouth 3 (three) times daily between meals.   ferrous sulfate 325 (65 FE) MG tablet Take 325 mg by mouth 2 (two) times daily with a meal.   levETIRAcetam 500 MG tablet Commonly known as:  KEPPRA Take 500 mg by mouth 2 (two) times daily.   levothyroxine 100 MCG tablet Commonly known as:  SYNTHROID, LEVOTHROID Take 100 mcg by mouth daily before breakfast.   mirtazapine 15 MG tablet Commonly known as:  REMERON Take 15 mg by mouth at bedtime.   mupirocin ointment 2 % Commonly known as:  BACTROBAN Place 1 application into the nose 2 (two) times daily.   pantoprazole 40 MG tablet Commonly known as:  PROTONIX Take 1 tablet (40 mg total) by mouth daily.   polyethylene glycol packet Commonly known as:  MIRALAX / GLYCOLAX Take 17 g by mouth daily.   sennosides-docusate  sodium 8.6-50 MG tablet Commonly known as:  SENOKOT-S Take 2 tablets by mouth 2 (two) times daily.   sertraline 50 MG tablet Commonly known as:  ZOLOFT Take 75 mg by mouth daily.   simvastatin 10 MG tablet Commonly known as:  ZOCOR Take 10 mg by mouth daily.       Allergies  Allergen Reactions  .  Acetaminophen   . Codeine Nausea Only  . Dilaudid [Hydromorphone]   . Hydrocodone     Hallucinations  . Keflex [Cephalexin] Diarrhea    Consultations:  None   Procedures/Studies:  No results found.    Subjective:   Discharge Exam: Vitals:   10/10/17 0402 10/10/17 0637  BP: (!) 129/32 (!) 135/41  Pulse: 79 79  Resp: 16 16  Temp: 98.2 F (36.8 C) 98.7 F (37.1 C)  SpO2: 100% 100%   Vitals:   10/10/17 0315 10/10/17 0347 10/10/17 0402 10/10/17 0637  BP: (!) 135/42 (!) 135/45 (!) 129/32 (!) 135/41  Pulse: 78 77 79 79  Resp: 16 16 16 16   Temp: 98 F (36.7 C) 98 F (36.7 C) 98.2 F (36.8 C) 98.7 F (37.1 C)  TempSrc: Oral Oral Oral Oral  SpO2: 100% 100% 100% 100%  Weight:      Height:        General: Pt is alert, awake, not in acute distress Cardiovascular: RRR, S1/S2 +, no rubs, no gallops Respiratory: CTA bilaterally, no wheezing, no rhonchi Abdominal: Soft, NT, ND, bowel sounds + Extremities: no edema, no cyanosis    The results of significant diagnostics from this hospitalization (including imaging, microbiology, ancillary and laboratory) are listed below for reference.     Microbiology: Recent Results (from the past 240 hour(s))  MRSA PCR Screening     Status: Abnormal   Collection Time: 10/10/17  7:27 AM  Result Value Ref Range Status   MRSA by PCR POSITIVE (A) NEGATIVE Final    Comment:        The GeneXpert MRSA Assay (FDA approved for NASAL specimens only), is one component of a comprehensive MRSA colonization surveillance program. It is not intended to diagnose MRSA infection nor to guide or monitor treatment for MRSA infections. RESULT CALLED TO, READ BACK BY AND VERIFIED WITH: MACABUAG,E. RN @0929  ON 3.29.19 BY [facility] Performed at [facility] [address], [place], [place] [address]      Labs: BNP (last 3 results) Recent Labs    12/05/16 2316  BNP 141.0*   Basic Metabolic Panel: Recent Labs  Lab  10/09/17 1833 10/09/17 1841 10/10/17 1019  NA 138 139 139  K 4.4 4.5 4.8  CL 111 105 106  CO2 25  --  25  GLUCOSE 112* 121* 146*  BUN 20 19 23*  CREATININE 0.52 0.60 0.55  CALCIUM 7.9*  --  8.8*   Liver Function Tests: No results for input(s): AST, ALT, ALKPHOS, BILITOT, PROT, ALBUMIN in the last 168 hours. No results for input(s): LIPASE, AMYLASE in the last 168 hours. No results for input(s): AMMONIA in the last 168 hours. CBC: Recent Labs  Lab 10/09/17 1833 10/09/17 1841 10/10/17 1019  WBC 6.1  --  8.6  NEUTROABS 3.8  --   --   HGB 5.7* 7.1* 9.5*  HCT 17.6* 21.0* 28.6*  MCV 113.5*  --  98.6  PLT 137*  --  132*   Cardiac Enzymes: No results for input(s): CKTOTAL, CKMB, CKMBINDEX, TROPONINI in the last 168 hours. BNP: Invalid input(s): POCBNP CBG:  No results for input(s): GLUCAP in the last 168 hours. D-Dimer No results for input(s): DDIMER in the last 72 hours. Hgb A1c No results for input(s): HGBA1C in the last 72 hours. Lipid Profile No results for input(s): CHOL, HDL, LDLCALC, TRIG, CHOLHDL, LDLDIRECT in the last 72 hours. Thyroid function studies No results for input(s): TSH, T4TOTAL, T3FREE, THYROIDAB in the last 72 hours.  Invalid input(s): FREET3 Anemia work up No results for input(s): VITAMINB12, FOLATE, FERRITIN, TIBC, IRON, RETICCTPCT in the last 72 hours. Urinalysis No results found for: COLORURINE, APPEARANCEUR, [place], [place], [place], [place], [place], [place], PROTEINUR, UROBILINOGEN, NITRITE, LEUKOCYTESUR Sepsis Labs Invalid input(s): PROCALCITONIN,  WBC,  LACTICIDVEN Microbiology Recent Results (from the past 240 hour(s))  MRSA PCR Screening     Status: Abnormal   Collection Time: 10/10/17  7:27 AM  Result Value Ref Range Status   MRSA by PCR POSITIVE (A) NEGATIVE Final    Comment:        The GeneXpert MRSA Assay (FDA approved for NASAL specimens only), is one component of a comprehensive MRSA colonization surveillance program.  It is not intended to diagnose MRSA infection nor to guide or monitor treatment for MRSA infections. RESULT CALLED TO, READ BACK BY AND VERIFIED WITH: MACABUAG,E. RN @0929  ON 3.29.19 BY [facility] Performed at [facility] [address] [place], [place] [address]      Time coordinating discharge: Over 30 minutes  SIGNED:   Beverely Pace, MD  [facility] 10/10/2017, 12:23 PM Pager [phone]  If 7PM-7AM, please contact night-coverage [email or web address] Password [email or web address]

## 2017-10-11 LAB — TYPE AND SCREEN
ABO/RH(D): A POS
Antibody Screen: NEGATIVE
UNIT DIVISION: 0
UNIT DIVISION: 0

## 2017-10-11 LAB — BPAM RBC
BLOOD PRODUCT EXPIRATION DATE: 201904102359
Blood Product Expiration Date: 201904102359
ISSUE DATE / TIME: 201903290007
ISSUE DATE / TIME: 201903290344
UNIT TYPE AND RH: 6200
UNIT TYPE AND RH: 6200

## 2017-11-03 ENCOUNTER — Encounter (HOSPITAL_COMMUNITY): Payer: Self-pay

## 2017-11-03 ENCOUNTER — Other Ambulatory Visit: Payer: Self-pay

## 2017-11-03 ENCOUNTER — Inpatient Hospital Stay (HOSPITAL_COMMUNITY)
Admission: EM | Admit: 2017-11-03 | Discharge: 2017-11-14 | DRG: 689 | Disposition: A | Discharge status: Skilled Nursing Facility | Payer: Medicare Other | Attending: Internal Medicine | Admitting: Internal Medicine

## 2017-11-03 DIAGNOSIS — R1310 Dysphagia, unspecified: Secondary | ICD-10-CM

## 2017-11-03 DIAGNOSIS — E43 Unspecified severe protein-calorie malnutrition: Secondary | ICD-10-CM

## 2017-11-03 DIAGNOSIS — R0600 Dyspnea, unspecified: Secondary | ICD-10-CM | POA: Diagnosis not present

## 2017-11-03 DIAGNOSIS — L89151 Pressure ulcer of sacral region, stage 1: Secondary | ICD-10-CM | POA: Diagnosis present

## 2017-11-03 DIAGNOSIS — I5023 Acute on chronic systolic (congestive) heart failure: Secondary | ICD-10-CM | POA: Diagnosis not present

## 2017-11-03 DIAGNOSIS — D696 Thrombocytopenia, unspecified: Secondary | ICD-10-CM | POA: Diagnosis present

## 2017-11-03 DIAGNOSIS — Z833 Family history of diabetes mellitus: Secondary | ICD-10-CM

## 2017-11-03 DIAGNOSIS — N390 Urinary tract infection, site not specified: Secondary | ICD-10-CM | POA: Diagnosis present

## 2017-11-03 DIAGNOSIS — I482 Chronic atrial fibrillation: Secondary | ICD-10-CM | POA: Diagnosis present

## 2017-11-03 DIAGNOSIS — E876 Hypokalemia: Secondary | ICD-10-CM | POA: Diagnosis not present

## 2017-11-03 DIAGNOSIS — D6489 Other specified anemias: Secondary | ICD-10-CM | POA: Diagnosis present

## 2017-11-03 DIAGNOSIS — R5381 Other malaise: Secondary | ICD-10-CM | POA: Diagnosis not present

## 2017-11-03 DIAGNOSIS — R4182 Altered mental status, unspecified: Secondary | ICD-10-CM

## 2017-11-03 DIAGNOSIS — R1312 Dysphagia, oropharyngeal phase: Secondary | ICD-10-CM | POA: Diagnosis present

## 2017-11-03 DIAGNOSIS — E785 Hyperlipidemia, unspecified: Secondary | ICD-10-CM | POA: Diagnosis present

## 2017-11-03 DIAGNOSIS — L8941 Pressure ulcer of contiguous site of back, buttock and hip, stage 1: Secondary | ICD-10-CM | POA: Diagnosis not present

## 2017-11-03 DIAGNOSIS — Z681 Body mass index (BMI) 19 or less, adult: Secondary | ICD-10-CM | POA: Diagnosis not present

## 2017-11-03 DIAGNOSIS — F0280 Dementia in other diseases classified elsewhere without behavioral disturbance: Secondary | ICD-10-CM

## 2017-11-03 DIAGNOSIS — D539 Nutritional anemia, unspecified: Secondary | ICD-10-CM | POA: Diagnosis present

## 2017-11-03 DIAGNOSIS — E11649 Type 2 diabetes mellitus with hypoglycemia without coma: Secondary | ICD-10-CM | POA: Diagnosis not present

## 2017-11-03 DIAGNOSIS — E440 Moderate protein-calorie malnutrition: Secondary | ICD-10-CM | POA: Diagnosis not present

## 2017-11-03 DIAGNOSIS — R627 Adult failure to thrive: Secondary | ICD-10-CM

## 2017-11-03 DIAGNOSIS — D500 Iron deficiency anemia secondary to blood loss (chronic): Secondary | ICD-10-CM | POA: Diagnosis not present

## 2017-11-03 DIAGNOSIS — G9341 Metabolic encephalopathy: Secondary | ICD-10-CM | POA: Diagnosis present

## 2017-11-03 DIAGNOSIS — Z79890 Hormone replacement therapy (postmenopausal): Secondary | ICD-10-CM

## 2017-11-03 DIAGNOSIS — G309 Alzheimer's disease, unspecified: Secondary | ICD-10-CM | POA: Diagnosis present

## 2017-11-03 DIAGNOSIS — Z515 Encounter for palliative care: Secondary | ICD-10-CM | POA: Diagnosis not present

## 2017-11-03 DIAGNOSIS — I081 Rheumatic disorders of both mitral and tricuspid valves: Secondary | ICD-10-CM | POA: Diagnosis present

## 2017-11-03 DIAGNOSIS — Z87440 Personal history of urinary (tract) infections: Secondary | ICD-10-CM

## 2017-11-03 DIAGNOSIS — D649 Anemia, unspecified: Secondary | ICD-10-CM | POA: Diagnosis not present

## 2017-11-03 DIAGNOSIS — N3000 Acute cystitis without hematuria: Secondary | ICD-10-CM | POA: Diagnosis not present

## 2017-11-03 DIAGNOSIS — E1151 Type 2 diabetes mellitus with diabetic peripheral angiopathy without gangrene: Secondary | ICD-10-CM | POA: Diagnosis not present

## 2017-11-03 DIAGNOSIS — I429 Cardiomyopathy, unspecified: Secondary | ICD-10-CM | POA: Diagnosis present

## 2017-11-03 DIAGNOSIS — Z7189 Other specified counseling: Secondary | ICD-10-CM | POA: Diagnosis not present

## 2017-11-03 DIAGNOSIS — I110 Hypertensive heart disease with heart failure: Secondary | ICD-10-CM | POA: Diagnosis present

## 2017-11-03 DIAGNOSIS — R638 Other symptoms and signs concerning food and fluid intake: Secondary | ICD-10-CM

## 2017-11-03 DIAGNOSIS — E875 Hyperkalemia: Secondary | ICD-10-CM | POA: Diagnosis not present

## 2017-11-03 DIAGNOSIS — E46 Unspecified protein-calorie malnutrition: Secondary | ICD-10-CM | POA: Diagnosis not present

## 2017-11-03 DIAGNOSIS — I2510 Atherosclerotic heart disease of native coronary artery without angina pectoris: Secondary | ICD-10-CM | POA: Diagnosis present

## 2017-11-03 DIAGNOSIS — L8990 Pressure ulcer of unspecified site, unspecified stage: Secondary | ICD-10-CM

## 2017-11-03 DIAGNOSIS — B379 Candidiasis, unspecified: Secondary | ICD-10-CM | POA: Diagnosis not present

## 2017-11-03 LAB — CBC
HCT: 24.9 % — ABNORMAL LOW (ref 36.0–46.0)
HEMOGLOBIN: 8.2 g/dL — AB (ref 12.0–15.0)
MCH: 34.2 pg — AB (ref 26.0–34.0)
MCHC: 32.9 g/dL (ref 30.0–36.0)
MCV: 103.8 fL — ABNORMAL HIGH (ref 78.0–100.0)
Platelets: 138 10*3/uL — ABNORMAL LOW (ref 150–400)
RBC: 2.4 MIL/uL — ABNORMAL LOW (ref 3.87–5.11)
RDW: 17.8 % — ABNORMAL HIGH (ref 11.5–15.5)
WBC: 5.4 10*3/uL (ref 4.0–10.5)

## 2017-11-03 LAB — COMPREHENSIVE METABOLIC PANEL
ALBUMIN: 2.7 g/dL — AB (ref 3.5–5.0)
ALK PHOS: 367 U/L — AB (ref 38–126)
ALT: 39 U/L (ref 14–54)
AST: 54 U/L — ABNORMAL HIGH (ref 15–41)
Anion gap: 7 (ref 5–15)
BUN: 14 mg/dL (ref 6–20)
CO2: 26 mmol/L (ref 22–32)
CREATININE: 0.46 mg/dL (ref 0.44–1.00)
Calcium: 8.8 mg/dL — ABNORMAL LOW (ref 8.9–10.3)
Chloride: 106 mmol/L (ref 101–111)
GFR calc Af Amer: >60 mL/min (ref >60)
GFR calc non Af Amer: >60 mL/min (ref >60)
GLUCOSE: 89 mg/dL (ref 65–99)
Potassium: 5.2 mmol/L — ABNORMAL HIGH (ref 3.5–5.1)
SODIUM: 139 mmol/L (ref 135–145)
Total Bilirubin: 1.3 mg/dL — ABNORMAL HIGH (ref 0.3–1.2)
Total Protein: 6.2 g/dL — ABNORMAL LOW (ref 6.5–8.1)

## 2017-11-03 LAB — URINALYSIS, ROUTINE W REFLEX MICROSCOPIC
Bilirubin Urine: NEGATIVE
GLUCOSE, UA: NEGATIVE mg/dL
HGB URINE DIPSTICK: NEGATIVE
Ketones, ur: 5 mg/dL — AB
NITRITE: NEGATIVE
PH: 7 (ref 5.0–8.0)
Protein, ur: 100 mg/dL — AB
RBC / HPF: NONE SEEN RBC/hpf (ref 0–5)
SPECIFIC GRAVITY, URINE: 1.014 (ref 1.005–1.030)

## 2017-11-03 LAB — I-STAT CG4 LACTIC ACID, ED: LACTIC ACID, VENOUS: 0.66 mmol/L (ref 0.5–1.9)

## 2017-11-03 LAB — POC OCCULT BLOOD, ED: FECAL OCCULT BLD: POSITIVE — AB

## 2017-11-03 LAB — CBG MONITORING, ED: Glucose-Capillary: 71 mg/dL (ref 65–99)

## 2017-11-03 MED ORDER — FERROUS SULFATE 325 (65 FE) MG PO TABS
325.0000 mg | ORAL_TABLET | Freq: Two times a day (BID) | ORAL | Status: DC
  Administered 2017-11-03 – 2017-11-14 (×7): 325 mg via ORAL
  Filled 2017-11-03 (×11): qty 1

## 2017-11-03 MED ORDER — SODIUM CHLORIDE 0.9 % IV BOLUS
500.0000 mL | Freq: Once | INTRAVENOUS | Status: AC
  Administered 2017-11-03: 500 mL via INTRAVENOUS

## 2017-11-03 MED ORDER — LEVETIRACETAM 500 MG PO TABS
500.0000 mg | ORAL_TABLET | Freq: Two times a day (BID) | ORAL | Status: DC
  Administered 2017-11-04 – 2017-11-05 (×4): 500 mg via ORAL
  Filled 2017-11-03 (×4): qty 1

## 2017-11-03 MED ORDER — MIRTAZAPINE 15 MG PO TABS
15.0000 mg | ORAL_TABLET | Freq: Every day | ORAL | Status: DC
  Administered 2017-11-04 – 2017-11-10 (×5): 15 mg via ORAL
  Filled 2017-11-03: qty 1
  Filled 2017-11-03: qty 0.5
  Filled 2017-11-03 (×4): qty 1
  Filled 2017-11-03: qty 0.5

## 2017-11-03 MED ORDER — VITAMIN D 1000 UNITS PO TABS
2000.0000 [IU] | ORAL_TABLET | Freq: Every day | ORAL | Status: DC
  Administered 2017-11-03 – 2017-11-14 (×6): 2000 [IU] via ORAL
  Filled 2017-11-03 (×7): qty 2

## 2017-11-03 MED ORDER — PANTOPRAZOLE SODIUM 40 MG PO TBEC
40.0000 mg | DELAYED_RELEASE_TABLET | Freq: Every day | ORAL | Status: DC
  Administered 2017-11-04 – 2017-11-14 (×6): 40 mg via ORAL
  Filled 2017-11-03 (×8): qty 1

## 2017-11-03 MED ORDER — IBUPROFEN 200 MG PO TABS
400.0000 mg | ORAL_TABLET | Freq: Four times a day (QID) | ORAL | Status: AC | PRN
  Administered 2017-11-03 – 2017-11-04 (×2): 400 mg via ORAL
  Filled 2017-11-03 (×2): qty 2

## 2017-11-03 MED ORDER — CARVEDILOL 12.5 MG PO TABS
12.5000 mg | ORAL_TABLET | Freq: Two times a day (BID) | ORAL | Status: DC
  Administered 2017-11-03 – 2017-11-14 (×8): 12.5 mg via ORAL
  Filled 2017-11-03 (×12): qty 1

## 2017-11-03 MED ORDER — SERTRALINE HCL 50 MG PO TABS
75.0000 mg | ORAL_TABLET | Freq: Every day | ORAL | Status: DC
  Administered 2017-11-03 – 2017-11-14 (×6): 75 mg via ORAL
  Filled 2017-11-03 (×7): qty 1

## 2017-11-03 MED ORDER — LEVOTHYROXINE SODIUM 100 MCG PO TABS
100.0000 ug | ORAL_TABLET | Freq: Every day | ORAL | Status: DC
  Administered 2017-11-04 – 2017-11-05 (×2): 100 ug via ORAL
  Filled 2017-11-03 (×2): qty 1

## 2017-11-03 MED ORDER — SODIUM POLYSTYRENE SULFONATE 15 GM/60ML PO SUSP
15.0000 g | Freq: Once | ORAL | Status: DC
  Filled 2017-11-03: qty 60

## 2017-11-03 MED ORDER — SODIUM CHLORIDE 0.9 % IV SOLN
INTRAVENOUS | Status: DC
  Administered 2017-11-03 – 2017-11-05 (×4): via INTRAVENOUS

## 2017-11-03 MED ORDER — SODIUM CHLORIDE 0.9 % IV SOLN
1.0000 g | INTRAVENOUS | Status: DC
  Administered 2017-11-03 – 2017-11-07 (×5): 1 g via INTRAVENOUS
  Filled 2017-11-03: qty 1
  Filled 2017-11-03: qty 10
  Filled 2017-11-03 (×3): qty 1
  Filled 2017-11-03: qty 10

## 2017-11-03 NOTE — H&P (Signed)
History and Physical  Nadina Fomby BOF:[id number] DOB: 05-Oct-1933 DOA: 11/03/2017  Referring physician: Dr Zenia Resides PCP: Patient, No Pcp Per  Outpatient Specialists:  Patient coming from: SNF  Chief Complaint: Altered mental status  HPI: Lexine Jaspers is a 84 y.o. female with medical history significant for peripheral vascular disease, cardiomyopathy, chronic atrial fibrillation, chronic systolic heart failure, oropharyngeal dysphagia, who presented to [facility] ED from SNF with altered mental status.  History is obtained from her son, ED physician, and medical records due to the patient's confusion in the setting of advanced dementia. She has been more somnolent with poor oral intake.  ED Course: On presentation to the ED, lab studies remarkable for hyperkalemia, drop in hemoglobin. FOBT positive.  Review of Systems: As noted in the HPI. All other systems reviewed and are negative.   Past Medical History:  Diagnosis Date  . Acute encephalopathy   . Acute encephalopathy 09/30/2016  . Alzheimer's dementia without behavioral disturbance   . Alzheimer's dementia without behavioral disturbance 10/07/2016  . Anemia   . Anemia 01/28/2014  . Atherosclerotic PVD with ulceration ([place])   . CAD (coronary artery disease)   . CAD (coronary artery disease) 01/04/2014  . Cellulitis   . Cellulitis of left leg   . Diabetes mellitus without complication ([place])   . DM (diabetes mellitus) ([place]) 01/27/2014  . HTN (hypertension) 01/04/2014  . Hyperlipidemia   . Hypertension   . Hypoglycemia associated with type 2 diabetes mellitus ([place])   . Macrocytic anemia   . Seizures ([place]) 09/30/2016  . Tear of left rotator cuff   . UTI (urinary tract infection)   . UTI (urinary tract infection) 09/25/2016   History reviewed. No pertinent surgical history.  Social History:  reports that she has never smoked. She has never used smokeless tobacco. She reports that she does not drink alcohol or use drugs.   Allergies    Allergen Reactions  . Acetaminophen   . Codeine Nausea Only  . Dilaudid [Hydromorphone]   . Hydrocodone     Hallucinations  . Keflex [Cephalexin] Diarrhea    Family History  Family history unknown: Yes    Father with diabetes, and history of stroke as reported by her son.  Prior to Admission medications   Medication Sig Start Date End Date Taking? Authorizing Provider  carvedilol (COREG) 12.5 MG tablet Take 12.5 mg by mouth 2 (two) times daily with a meal. Hold for SBP<110 and/or pulse <60/min   Yes [provider]  Cholecalciferol (VITAMIN D3) 2000 units TABS Take 2,000 Units by mouth daily.   Yes [provider]  ferrous sulfate 325 (65 FE) MG tablet Take 325 mg by mouth 2 (two) times daily with a meal.   Yes [provider]  levETIRAcetam (KEPPRA) 500 MG tablet Take 500 mg by mouth 2 (two) times daily.   Yes [provider]  levothyroxine (SYNTHROID, LEVOTHROID) 100 MCG tablet Take 100 mcg by mouth daily before breakfast.   Yes [provider]  mirtazapine (REMERON) 15 MG tablet Take 15 mg by mouth at bedtime.   Yes [provider]  Multiple Vitamins-Minerals (DECUBI-VITE PO) Take 1 capsule by mouth daily.   Yes [provider]  ondansetron (ZOFRAN) 4 MG tablet Take 4 mg by mouth every 6 (six) hours as needed for nausea or vomiting.   Yes [provider]  pantoprazole (PROTONIX) 40 MG tablet Take 1 tablet (40 mg total) by mouth daily. 07/25/17  Yes Barton Dubois, MD  polyethylene glycol (  MIRALAX / GLYCOLAX) packet Take 17 g by mouth daily.   Yes [provider]  sennosides-docusate sodium (SENOKOT-S) 8.6-50 MG tablet Take 2 tablets by mouth 2 (two) times daily.   Yes [provider]  sertraline (ZOLOFT) 50 MG tablet Take 75 mg by mouth daily.   Yes [provider]  feeding supplement, ENSURE ENLIVE, (ENSURE ENLIVE) LIQD Take 237 mLs by mouth 3 (three) times daily between meals. Patient  not taking: Reported on 11/03/2017 10/10/17   Yaakov Guthrie, MD  mupirocin ointment (BACTROBAN) 2 % Place 1 application into the nose 2 (two) times daily. Patient not taking: Reported on 11/03/2017 10/10/17   Yaakov Guthrie, MD    Physical Exam: BP (!) 144/57   Pulse 81   Temp (!) 96.7 F (35.9 C) (Rectal)   Resp 12   Wt 48.1 kg (106 lb)   SpO2 100%   BMI 19.39 kg/m   General:  84 yo CF cachectic NAD. Alert in the setting of advanced dementia.  Eyes: Anicteric sclerae ENT: Dry mucosa with no erythema or exudates Neck: No JVD or thyromegaly  Cardiovascular: IRR no rubs or gallops Respiratory: CTA no wheezes or rales Abdomen: non tender non distended w NBS Skin: right upper arm and left lower lower extremity discoloration  Musculoskeletal: Right below the knee amputation  Psychiatric: Mood is appropriate for condition and setting. Neurologic: Alert in the setting of advanced dementia.           Labs on Admission:  Basic Metabolic Panel: Recent Labs  Lab 11/03/17 1148  NA 139  K 5.2*  CL 106  CO2 26  GLUCOSE 89  BUN 14  CREATININE 0.46  CALCIUM 8.8*   Liver Function Tests: Recent Labs  Lab 11/03/17 1148  AST 54*  ALT 39  ALKPHOS 367*  BILITOT 1.3*  PROT 6.2*  ALBUMIN 2.7*   No results for input(s): LIPASE, AMYLASE in the last 168 hours. No results for input(s): AMMONIA in the last 168 hours. CBC: Recent Labs  Lab 11/03/17 1148  WBC 5.4  HGB 8.2*  HCT 24.9*  MCV 103.8*  PLT 138*   Cardiac Enzymes: No results for input(s): CKTOTAL, CKMB, CKMBINDEX, TROPONINI in the last 168 hours.  BNP (last 3 results) Recent Labs    12/05/16 2316  BNP 162.3*    ProBNP (last 3 results) No results for input(s): PROBNP in the last 8760 hours.  CBG: Recent Labs  Lab 11/03/17 1350  GLUCAP 71    Radiological Exams on Admission: No results found.  EKG: Independently reviewed.  Personally reviewed EKG which revealed A-flutter with a rate of  79.  Assessment/Plan Present on Admission: . UTI (urinary tract infection)  Active Problems:   UTI (urinary tract infection)  Recurrent UTI, poa Continue IV ceftriaxone Urine culture pending De-escalate antibiotics once urine culture results are back Monitor fever curve  Hyperkalemia Potassium 5.2 Give Kayexalate Repeat BMP in the morning  Possible GI bleed FOBT positive Hg 8 from 9 Repeat CBC am If hg continues to drop, may consider GI consult Start po protonix 40 mg daily  Severe protein calorie deficiency Albumin 2.7 BMI 19 Dietitian to assist in calorie counting and recommendations Encourage increase oral intake   Acute blood loss Baseline 9.5 Hg 8.2  Physical debility OOB to chair as tolerated Fall precaution   DVT prophylaxis: SQ lovenox daily  Code Status: Full code  Family Communication: Son at bedside  Disposition Plan: SNF  Consults called: None  Admission status:  Inpatient     Kayleen Memos MD [facility] Pager [phone]  If 7PM-7AM, please contact night-coverage [email or web address] Password [facility]  11/03/2017, 4:42 PM

## 2017-11-03 NOTE — ED Triage Notes (Signed)
Patient BIB EMS from [facility] and [place]. EMS was called out for AMS in the patient. Patient has history of dementia, but rehab notes since the patient came in 3 weeks ago, she has slowly become more confused and pale. Staff noted there has been blood noted in patient brief, but unsure where the blood is coming from. Patient has history of needing blood transfusions and staff is concerned patient needs another transfusion. Patient does have pacemaker. Per EMS, patient's family has been notified by the facility and are on their way to [facility].

## 2017-11-03 NOTE — ED Notes (Signed)
ED TO INPATIENT HANDOFF REPORT  Name/Age/Gender Jean Hodges 84 y.o. female  Code Status    Code Status Orders  (From admission, onward)        Start     Ordered   11/03/17 1648  Full code  Continuous     11/03/17 1648    Code Status History    Date Active Date Inactive Code Status Order ID Comments User Context   10/09/2017 2031 10/10/2017 1831 Full Code [id number]  Norval Morton, MD ED   07/23/2017 1509 07/24/2017 1809 Full Code [id number]  Phillips Grout, MD Inpatient   11/27/2016 2203 11/28/2016 2154 Full Code [id number]  Rise Patience, MD Inpatient      Home/SNF/Other Rehab  Chief Complaint AMS  Level of Care/Admitting Diagnosis ED Disposition    ED Disposition Condition Comment   [facility]: [facility] [[id number]]  Level of Care: Med-Surg [16]  Diagnosis: UTI (urinary tract infection) [809983]  Admitting Physician: Kayleen Memos [[id number]]  Attending Physician: Kayleen Memos [[id number]]  Estimated length of stay: past midnight tomorrow  Certification:: I certify this patient will need inpatient services for at least 2 midnights  PT Class (Do Not Modify): Inpatient [101]  PT Acc Code (Do Not Modify): Private [1]       Medical History Past Medical History:  Diagnosis Date  . Acute encephalopathy   . Acute encephalopathy 09/30/2016  . Alzheimer's dementia without behavioral disturbance   . Alzheimer's dementia without behavioral disturbance 10/07/2016  . Anemia   . Anemia 01/28/2014  . Atherosclerotic PVD with ulceration ([place])   . CAD (coronary artery disease)   . CAD (coronary artery disease) 01/04/2014  . Cellulitis   . Cellulitis of left leg   . Diabetes mellitus without complication ([place])   . DM (diabetes mellitus) ([place]) 01/27/2014  . HTN (hypertension) 01/04/2014  . Hyperlipidemia   . Hypertension   . Hypoglycemia associated with type 2 diabetes mellitus ([place])   . Macrocytic anemia   . Seizures ([place]) 09/30/2016   . Tear of left rotator cuff   . UTI (urinary tract infection)   . UTI (urinary tract infection) 09/25/2016    Allergies Allergies  Allergen Reactions  . Acetaminophen   . Codeine Nausea Only  . Dilaudid [Hydromorphone]   . Hydrocodone     Hallucinations  . Keflex [Cephalexin] Diarrhea    IV Location/Drains/Wounds Patient Lines/Drains/Airways Status   Active Line/Drains/Airways    Name:   Placement date:   Placement time:   Site:   Days:   Peripheral IV 11/03/17 Left;Upper Arm   11/03/17    1251    Arm   less than 1   External Urinary Catheter   10/09/17    2234    -   25          Labs/Imaging Results for orders placed or performed during the hospital encounter of 11/03/17 (from the past 48 hour(s))  Comprehensive metabolic panel     Status: Abnormal   Collection Time: 11/03/17 11:48 AM  Result Value Ref Range   Sodium 139 135 - 145 mmol/L   Potassium 5.2 (H) 3.5 - 5.1 mmol/L   Chloride 106 101 - 111 mmol/L   CO2 26 22 - 32 mmol/L   Glucose, Bld 89 65 - 99 mg/dL   BUN 14 6 - 20 mg/dL   Creatinine, Ser 0.46 0.44 - 1.00 mg/dL   Calcium 8.8 (L) 8.9 - 10.3 mg/dL  Total Protein 6.2 (L) 6.5 - 8.1 g/dL   Albumin 2.7 (L) 3.5 - 5.0 g/dL   AST 54 (H) 15 - 41 U/L   ALT 39 14 - 54 U/L   Alkaline Phosphatase 367 (H) 38 - 126 U/L   Total Bilirubin 1.3 (H) 0.3 - 1.2 mg/dL   GFR calc non Af Amer >60 >60 mL/min   GFR calc Af Amer >60 >60 mL/min    Comment: (NOTE) The eGFR has been calculated using the CKD EPI equation. This calculation has not been validated in all clinical situations. eGFR's persistently <60 mL/min signify possible Chronic Kidney Disease.    Anion gap 7 5 - 15    Comment: Performed at [facility], [place] [address], [place], [place] [address]  CBC     Status: Abnormal   Collection Time: 11/03/17 11:48 AM  Result Value Ref Range   WBC 5.4 4.0 - 10.5 K/uL   RBC 2.40 (L) 3.87 - 5.11 MIL/uL   Hemoglobin 8.2 (L) 12.0 - 15.0 g/dL   HCT 24.9  (L) 36.0 - 46.0 %   MCV 103.8 (H) 78.0 - 100.0 fL   MCH 34.2 (H) 26.0 - 34.0 pg   MCHC 32.9 30.0 - 36.0 g/dL   RDW 17.8 (H) 11.5 - 15.5 %   Platelets 138 (L) 150 - 400 K/uL    Comment: Performed at [facility], [place] [address] [place], [place] [address]  Urinalysis, Routine w reflex microscopic     Status: Abnormal   Collection Time: 11/03/17 12:18 PM  Result Value Ref Range   Color, Urine AMBER (A) YELLOW    Comment: BIOCHEMICALS MAY BE AFFECTED BY COLOR   APPearance CLOUDY (A) CLEAR   Specific Gravity, Urine 1.014 1.005 - 1.030   pH 7.0 5.0 - 8.0   Glucose, UA NEGATIVE NEGATIVE mg/dL   Hgb urine dipstick NEGATIVE NEGATIVE   Bilirubin Urine NEGATIVE NEGATIVE   Ketones, ur 5 (A) NEGATIVE mg/dL   Protein, ur 100 (A) NEGATIVE mg/dL   Nitrite NEGATIVE NEGATIVE   Leukocytes, UA LARGE (A) NEGATIVE   RBC / HPF NONE SEEN 0 - 5 RBC/hpf   WBC, UA TOO NUMEROUS TO COUNT 0 - 5 WBC/hpf   Bacteria, UA MANY (A) NONE SEEN   Squamous Epithelial / LPF 0-5 (A) NONE SEEN   WBC Clumps PRESENT    Mucus PRESENT     Comment: Performed at [facility], [place] [address] [place], [place] [address]  Type and screen     Status: None   Collection Time: 11/03/17 12:18 PM  Result Value Ref Range   ABO/RH(D) A POS    Antibody Screen NEG    Sample Expiration      11/06/2017 Performed at [facility], [place] [address], [place], [place] [address]   POC occult blood, ED RN will collect     Status: Abnormal   Collection Time: 11/03/17 12:26 PM  Result Value Ref Range   Fecal Occult Bld POSITIVE (A) NEGATIVE  I-Stat CG4 Lactic Acid, ED     Status: None   Collection Time: 11/03/17 12:53 PM  Result Value Ref Range   Lactic Acid, Venous 0.66 0.5 - 1.9 mmol/L  CBG monitoring, ED     Status: None   Collection Time: 11/03/17  1:50 PM  Result Value Ref Range   Glucose-Capillary 71 65 - 99 mg/dL   No results found.  Pending Labs [employer] (From  admission,  onward)   Start     Ordered   11/04/17 0500  CBC  Tomorrow morning,   R     11/03/17 1709   11/04/17 0500  Comprehensive metabolic panel  Tomorrow morning,   R     11/03/17 1709   11/03/17 1152  Urine culture  STAT,   STAT     11/03/17 1151      Vitals/Pain Today's Vitals   11/03/17 1630 11/03/17 1700 11/03/17 1705 11/03/17 1730  BP: (!) 150/56 (!) 153/55 (!) 153/55 (!) 153/53  Pulse: 79 79 80 78  Resp: 19 15 18 13   Temp:      TempSrc:      SpO2: 97% 100% 97% 98%  Weight:        Isolation Precautions No active isolations  Medications Medications  0.9 %  sodium chloride infusion ( Intravenous New Bag/Given 11/03/17 1350)  cefTRIAXone (ROCEPHIN) 1 g in sodium chloride 0.9 % 100 mL IVPB (0 g Intravenous Stopped 11/03/17 1416)  carvedilol (COREG) tablet 12.5 mg (has no administration in time range)  cholecalciferol (VITAMIN D) tablet 2,000 Units (has no administration in time range)  ferrous sulfate tablet 325 mg (has no administration in time range)  levETIRAcetam (KEPPRA) tablet 500 mg (has no administration in time range)  levothyroxine (SYNTHROID, LEVOTHROID) tablet 100 mcg (has no administration in time range)  mirtazapine (REMERON) tablet 15 mg (has no administration in time range)  pantoprazole (PROTONIX) EC tablet 40 mg (has no administration in time range)  sertraline (ZOLOFT) tablet 75 mg (has no administration in time range)  sodium chloride 0.9 % bolus 500 mL (0 mLs Intravenous Stopped 11/03/17 1359)    Mobility non-ambulatory- right below the knee amputation

## 2017-11-03 NOTE — ED Provider Notes (Signed)
[facility] DEPT Provider Note   CSN: [id number] Arrival date & time: 11/03/17  1133     History   Chief Complaint Chief Complaint  Patient presents with  . Altered Mental Status    HPI Jean Hodges is a 84 y.o. female.  84 year old female presents from nursing home with several weeks of decreased mental status and possible need for blood transfusion.  She has a history of dementia and cannot give a history at this time this is from EMS.  The patient has not been as active for the last several weeks.  She was noted to be slightly more pale with possible blood in her diaper.  She does not take any blood thinners.  No reported history of hematemesis.  He has called and patient transported here.  No further history obtainable due to her current state     Past Medical History:  Diagnosis Date  . Acute encephalopathy   . Acute encephalopathy 09/30/2016  . Alzheimer's dementia without behavioral disturbance   . Alzheimer's dementia without behavioral disturbance 10/07/2016  . Anemia   . Anemia 01/28/2014  . Atherosclerotic PVD with ulceration ([place])   . CAD (coronary artery disease)   . CAD (coronary artery disease) 01/04/2014  . Cellulitis   . Cellulitis of left leg   . Diabetes mellitus without complication ([place])   . DM (diabetes mellitus) ([place]) 01/27/2014  . HTN (hypertension) 01/04/2014  . Hyperlipidemia   . Hypertension   . Hypoglycemia associated with type 2 diabetes mellitus ([place])   . Macrocytic anemia   . Seizures ([place]) 09/30/2016  . Tear of left rotator cuff   . UTI (urinary tract infection)   . UTI (urinary tract infection) 09/25/2016    Patient Active Problem List   Diagnosis Date Noted  . Protein-calorie malnutrition, severe 10/10/2017  . Anemia 07/23/2017  . Symptomatic anemia 11/27/2016  . Tonic-clonic seizure disorder ([place]) 10/09/2016  . Oropharyngeal dysphagia 10/09/2016  . Moderate protein-calorie malnutrition ([place])  10/09/2016  . Hypothyroidism (acquired) 10/09/2016  . Type 2 diabetes mellitus with peripheral vascular disease ([place]) 10/09/2016  . PVD (peripheral vascular disease) ([place]) 10/09/2016  . Essential hypertension 10/09/2016  . Alzheimer disease 10/09/2016  . Chronic depression 10/09/2016  . Hyperlipidemia 10/09/2016  . Coronary artery disease involving native coronary artery of native heart without angina pectoris 10/09/2016    History reviewed. No pertinent surgical history.   OB History   None      Home Medications    Prior to Admission medications   Medication Sig Start Date End Date Taking? Authorizing Provider  carvedilol (COREG) 12.5 MG tablet Take 12.5 mg by mouth 2 (two) times daily with a meal.    [provider]  cholecalciferol (VITAMIN D) 1000 units tablet Take 2,000 Units by mouth daily.    [provider]  feeding supplement, ENSURE ENLIVE, (ENSURE ENLIVE) LIQD Take 237 mLs by mouth 3 (three) times daily between meals. 10/10/17   Matcha, Beverely Pace, MD  ferrous sulfate 325 (65 FE) MG tablet Take 325 mg by mouth 2 (two) times daily with a meal.    [provider]  levETIRAcetam (KEPPRA) 500 MG tablet Take 500 mg by mouth 2 (two) times daily.    [provider]  levothyroxine (SYNTHROID, LEVOTHROID) 100 MCG tablet Take 100 mcg by mouth daily before breakfast.    [provider]  mirtazapine (REMERON) 15 MG tablet Take 15 mg by mouth at bedtime.    [provider]  Multiple Vitamins-Minerals (DECUBI-VITE PO) Take 1 capsule by mouth daily.    [provider]  mupirocin ointment (BACTROBAN) 2 % Place 1 application into the nose 2 (two) times daily. 10/10/17   Matcha, Beverely Pace, MD  pantoprazole (PROTONIX) 40 MG tablet Take 1 tablet (40 mg total) by mouth daily. 07/25/17   Barton Dubois, MD  polyethylene glycol [facility] / Floria Raveling) packet Take 17 g by mouth daily.    [provider]  sennosides-docusate sodium  (SENOKOT-S) 8.6-50 MG tablet Take 2 tablets by mouth 2 (two) times daily.    [provider]  sertraline (ZOLOFT) 50 MG tablet Take 75 mg by mouth daily.    [provider]  simvastatin (ZOCOR) 10 MG tablet Take 10 mg by mouth daily.    [provider]    Family History Family History  Family history unknown: Yes    Social History Social History   Tobacco Use  . Smoking status: Never Smoker  . Smokeless tobacco: Never Used  Substance Use Topics  . Alcohol use: No  . Drug use: No     Allergies   Acetaminophen; Codeine; Dilaudid [hydromorphone]; Hydrocodone; and Keflex [cephalexin]   Review of Systems Review of Systems  Unable to perform ROS: Dementia     Physical Exam Updated Vital Signs Wt 48.1 kg (106 lb)   BMI 19.39 kg/m   Physical Exam  Constitutional: She appears cachectic. She is cooperative. She has a sickly appearance.  HENT:  Head: Normocephalic and atraumatic.  Eyes: Pupils are equal, round, and reactive to light. Conjunctivae, EOM and lids are normal.  Neck: Normal range of motion. Neck supple. No tracheal deviation present. No thyroid mass present.  Cardiovascular: Normal rate, regular rhythm and normal heart sounds. Exam reveals no gallop.  No murmur heard. Pulmonary/Chest: Effort normal and breath sounds normal. No stridor. No respiratory distress. She has no decreased breath sounds. She has no wheezes. She has no rhonchi. She has no rales.  Abdominal: Soft. Normal appearance and bowel sounds are normal. She exhibits no distension. There is no tenderness. There is no rigidity, no rebound, no guarding and no CVA tenderness.  Musculoskeletal: Normal range of motion. She exhibits no edema or tenderness.  Neurological: She is alert. She displays atrophy. No cranial nerve deficit or sensory deficit. GCS eye subscore is 4. GCS verbal subscore is 4. GCS motor subscore is 5.  Skin: Skin is warm and dry. No abrasion and no rash noted.    Psychiatric: Her affect is blunt.  Nursing note and vitals reviewed.    ED Treatments / Results  Labs (all labs ordered are listed, but only abnormal results are displayed) Labs Reviewed  URINE CULTURE  COMPREHENSIVE METABOLIC PANEL  CBC  URINALYSIS, ROUTINE W REFLEX MICROSCOPIC  CBG MONITORING, ED  I-STAT CG4 LACTIC ACID, ED  TYPE AND SCREEN    EKG EKG Interpretation  Date/Time:  Monday November 03 2017 12:41:59 EDT Ventricular Rate:  79 PR Interval:    QRS Duration: 161 QT Interval:  415 QTC Calculation: 476 R Axis:   0 Text Interpretation:  Afib/flutter and ventricular-paced rhythm No further analysis attempted due to paced rhythm No significant change since last tracing Confirmed by Lacretia Leigh ([id number]) on 11/03/2017 1:59:14 PM   Radiology No results found.  Procedures Procedures (including critical care time)  Medications Ordered in ED Medications  0.9 %  sodium chloride infusion (has no administration in time range)  sodium chloride 0.9 % bolus 500 mL (has no administration  in time range)     Initial Impression / Assessment and Plan / ED Course  I have reviewed the triage vital signs and the nursing notes.  Pertinent labs & imaging results that were available during my care of the patient were reviewed by me and considered in my medical decision making (see chart for details).    Patient is urinalysis positive for infection here.  Patient with history of dementia and with change in level of consciousness suspect this is the etiology.  Do not feel that she needs to have a head CT at this time.  Will start on IV antibiotics and admit to the hospitalist service Final Clinical Impressions(s) / ED Diagnoses   Final diagnoses:  None    ED Discharge Orders    None       Lacretia Leigh, MD 11/03/17 1408

## 2017-11-03 NOTE — ED Notes (Signed)
Bed: WA08 Expected date:  Expected time:  Means of arrival:  Comments: 84 yo f AMS

## 2017-11-04 DIAGNOSIS — F0280 Dementia in other diseases classified elsewhere without behavioral disturbance: Secondary | ICD-10-CM

## 2017-11-04 DIAGNOSIS — E1151 Type 2 diabetes mellitus with diabetic peripheral angiopathy without gangrene: Secondary | ICD-10-CM

## 2017-11-04 DIAGNOSIS — E440 Moderate protein-calorie malnutrition: Secondary | ICD-10-CM

## 2017-11-04 DIAGNOSIS — G309 Alzheimer's disease, unspecified: Secondary | ICD-10-CM

## 2017-11-04 DIAGNOSIS — L8990 Pressure ulcer of unspecified site, unspecified stage: Secondary | ICD-10-CM

## 2017-11-04 DIAGNOSIS — E875 Hyperkalemia: Secondary | ICD-10-CM

## 2017-11-04 LAB — URINE CULTURE

## 2017-11-04 LAB — COMPREHENSIVE METABOLIC PANEL
ALK PHOS: 292 U/L — AB (ref 38–126)
ALT: 34 U/L (ref 14–54)
AST: 40 U/L (ref 15–41)
Albumin: 2.4 g/dL — ABNORMAL LOW (ref 3.5–5.0)
Anion gap: 9 (ref 5–15)
BILIRUBIN TOTAL: 1.2 mg/dL (ref 0.3–1.2)
BUN: 14 mg/dL (ref 6–20)
CALCIUM: 8.3 mg/dL — AB (ref 8.9–10.3)
CO2: 24 mmol/L (ref 22–32)
Chloride: 111 mmol/L (ref 101–111)
Creatinine, Ser: 0.46 mg/dL (ref 0.44–1.00)
Glucose, Bld: 52 mg/dL — ABNORMAL LOW (ref 65–99)
Potassium: 3.2 mmol/L — ABNORMAL LOW (ref 3.5–5.1)
Sodium: 144 mmol/L (ref 135–145)
TOTAL PROTEIN: 5.3 g/dL — AB (ref 6.5–8.1)

## 2017-11-04 LAB — CBC
HCT: 21.1 % — ABNORMAL LOW (ref 36.0–46.0)
Hemoglobin: 7.1 g/dL — ABNORMAL LOW (ref 12.0–15.0)
MCH: 34.8 pg — ABNORMAL HIGH (ref 26.0–34.0)
MCHC: 33.6 g/dL (ref 30.0–36.0)
MCV: 103.4 fL — ABNORMAL HIGH (ref 78.0–100.0)
Platelets: 104 10*3/uL — ABNORMAL LOW (ref 150–400)
RBC: 2.04 MIL/uL — AB (ref 3.87–5.11)
RDW: 17.9 % — ABNORMAL HIGH (ref 11.5–15.5)
WBC: 5.7 10*3/uL (ref 4.0–10.5)

## 2017-11-04 LAB — GLUCOSE, CAPILLARY
GLUCOSE-CAPILLARY: 42 mg/dL — AB (ref 65–99)
GLUCOSE-CAPILLARY: 65 mg/dL (ref 65–99)
Glucose-Capillary: 87 mg/dL (ref 65–99)
Glucose-Capillary: 180 mg/dL — ABNORMAL HIGH (ref 65–99)
Glucose-Capillary: 120 mg/dL — ABNORMAL HIGH (ref 65–99)

## 2017-11-04 LAB — PREPARE RBC (CROSSMATCH)

## 2017-11-04 LAB — CREATININE, URINE, RANDOM: CREATININE, URINE: 167.73 mg/dL

## 2017-11-04 LAB — SODIUM, URINE, RANDOM: SODIUM UR: 107 mmol/L

## 2017-11-04 MED ORDER — CHLORHEXIDINE GLUCONATE CLOTH 2 % EX PADS
6.0000 | MEDICATED_PAD | Freq: Every day | CUTANEOUS | Status: AC
  Administered 2017-11-06 – 2017-11-08 (×3): 6 via TOPICAL

## 2017-11-04 MED ORDER — KETOROLAC TROMETHAMINE 15 MG/ML IJ SOLN
15.0000 mg | Freq: Three times a day (TID) | INTRAMUSCULAR | Status: AC | PRN
  Administered 2017-11-04 – 2017-11-08 (×4): 15 mg via INTRAVENOUS
  Filled 2017-11-04 (×4): qty 1

## 2017-11-04 MED ORDER — MUPIROCIN 2 % EX OINT
1.0000 "application " | TOPICAL_OINTMENT | Freq: Two times a day (BID) | CUTANEOUS | Status: AC
  Administered 2017-11-04 – 2017-11-08 (×9): 1 via NASAL
  Filled 2017-11-04: qty 22

## 2017-11-04 MED ORDER — POLYETHYLENE GLYCOL 3350 17 G PO PACK
17.0000 g | PACK | Freq: Every day | ORAL | Status: DC
  Administered 2017-11-08 – 2017-11-14 (×2): 17 g via ORAL
  Filled 2017-11-04 (×5): qty 1

## 2017-11-04 MED ORDER — SODIUM CHLORIDE 0.9 % IV SOLN
Freq: Once | INTRAVENOUS | Status: AC
  Administered 2017-11-04: 14:00:00 via INTRAVENOUS

## 2017-11-04 MED ORDER — ENSURE ENLIVE PO LIQD
237.0000 mL | Freq: Three times a day (TID) | ORAL | Status: DC
  Administered 2017-11-04 – 2017-11-13 (×4): 237 mL via ORAL

## 2017-11-04 MED ORDER — SENNOSIDES-DOCUSATE SODIUM 8.6-50 MG PO TABS
2.0000 | ORAL_TABLET | Freq: Two times a day (BID) | ORAL | Status: DC
  Administered 2017-11-04 – 2017-11-14 (×8): 2 via ORAL
  Filled 2017-11-04 (×11): qty 2

## 2017-11-04 MED ORDER — ONDANSETRON HCL 4 MG PO TABS: 4.0000 mg | ORAL_TABLET | Freq: Four times a day (QID) | ORAL | Status: DC | PRN

## 2017-11-04 NOTE — Progress Notes (Signed)
Initial Nutrition Assessment  DOCUMENTATION CODES:   Non-severe (moderate) malnutrition in context of chronic illness  INTERVENTION:    Ensure Enlive po BID, each supplement provides 350 kcal and 20 grams of protein  Magic cup BID with meals, each supplement provides 290 kcal and 9 grams of protein  NUTRITION DIAGNOSIS:   Moderate Malnutrition related to chronic illness(dementia/dysphagia) as evidenced by moderate fat depletion, moderate muscle depletion, severe muscle depletion.  GOAL:   Patient will meet greater than or equal to 90% of their needs  MONITOR:   PO intake, Supplement acceptance, Weight trends, Labs  REASON FOR ASSESSMENT:   Consult Assessment of nutrition requirement/status  ASSESSMENT:   Patient with PMH significant for PVD, cardiomyopathy, CHF, right BKA, HTN, HLD, and oropharyngeal dysphagia. Recently admitted 3/29 for low hemoglobin requiring blood transfusions. Presents this admission with recurrent UTI and possible GI bleed.    Pt confused upon assessment. RD observed ice cream at bedside 50% completed. Spoke with nursing who reports pt ate a small amount amount of grits, a couple bites of orange sherbet, and one glass of orange juice. Tech states pt hold food in her mouth and needs continuous encouragement to swallow. Unsure if pt is selective with what she swallows or is having actual difficulty. Would recommend SLP consult. RD tro order Ensure and Magic Cup to maximize calories and protein.   Per last RD, pt reports a UBW of 120 lb. Records indicate pt 114 lb 07/24/17 and 109 lb this admission (4.3% wt loss in four months, insignificant for time frame). Nutrition-Focused physical exam completed.   Medications reviewed and include: Vit D, ferrous sulfate, Remeron, Kayexalate, NS @ 75 ml/hr, IV abx Labs reviewed: K 3.2 (L) CBG 52 (L)  NUTRITION - FOCUSED PHYSICAL EXAM:    Most Recent Value  Orbital Region  No depletion  Upper Arm Region  Moderate  depletion  Thoracic and Lumbar Region  Unable to assess  Buccal Region  Moderate depletion  Temple Region  Moderate depletion  Clavicle Bone Region  Severe depletion  Clavicle and Acromion Bone Region  Severe depletion  Scapular Bone Region  Unable to assess  Dorsal Hand  Moderate depletion  Patellar Region  Severe depletion  Anterior Thigh Region  Severe depletion  Posterior Calf Region  Severe depletion  Edema (RD Assessment)  Moderate [right arm]  Hair  Reviewed  Eyes  Reviewed  Mouth  Reviewed  Skin  Reviewed  Nails  Reviewed     Diet Order:  DIET - DYS 1 Room service appropriate? Yes; Fluid consistency: Thin  EDUCATION NEEDS:   Not appropriate for education at this time  Skin:  Skin Assessment: Skin Integrity Issues: Skin Integrity Issues:: Stage I Stage I: coccyx  Last BM:  PTA  Height:   Ht Readings from Last 1 Encounters:  11/04/17 5\' 1"  (1.549 m)    Weight:   Wt Readings from Last 1 Encounters:  11/04/17 109 lb 8 oz (49.7 kg)    Ideal Body Weight:  46 kg(adjusted BKA)  BMI:  Body mass index is 20.69 kg/m.  Estimated Nutritional Needs:   Kcal:  1350-1550 kcal  Protein:  65-75 g  Fluid:  >1.3 L/day    Mariana Single RD, LDN Clinical Nutrition Pager # [phone]

## 2017-11-04 NOTE — Progress Notes (Addendum)
Hypoglycemic Event  CBG: 42  Treatment: 15 GM carbohydrate snack  Symptoms: Shaky  Follow-up CBG: Time: 0935 CBG Result: 87  Possible Reasons for Event: Inadequate meal intake  Comments/MD notified: CMP glucose 52, CBG checked 42. MD notified. Pt given snack.    Kizzie Ide

## 2017-11-04 NOTE — Progress Notes (Signed)
[facility] PROGRESS NOTE    Progress Note  Jean Hodges  TDD:[id number] DOB: [birth year] DOA: 11/03/2017 PCP: Patient, No Pcp Per     Brief Narrative:   Jean Hodges is an 84 y.o. female past medical history of peripheral vascular disease, Alzheimer's dementia, chronic atrial fibrillation, chronic systolic heart failure comes in for encephalopathy and poor oral intake.  Assessment/Plan:   Recurrent   UTI (urinary tract infection): Continue IV Rocephin. Fevers have improved. Culture data is pending.  Moderate protein-calorie malnutrition ([place]): Ensure tid. He is pocketing her food and her pills we will check a swallowing evaluation, will consult hospice and palliative care  Type 2 diabetes mellitus with peripheral vascular disease ([place])  Mild hypoglycemic episode this morning, continue check CBGs every 4. Will discontinue sliding scale insulin. As per nurse she is pocketing her food, will check a swallowing evaluation.  Alzheimer's dementia Without behavioral disturbances.  Pressure injury of skin Turn patient every 2 hours.  Hyperkalemia Now hypokalemic, likely due to Kayexalate repeat a basic metabolic panel in the morning.  Mild drop in hemoglobin: FOBT negative we will continue perform FOBT's, will transfuse 1 unit of packed red blood cells.  She is not a candidate for endoscopy due to her advanced age and advanced dementia unless she starts having frank melanotic stools or brisk bleeding  DVT prophylaxis: SCD Family Communication:none Disposition Plan/Barrier to D/C: once Urine culture back Code Status:     Code Status Orders  (From admission, onward)        Start     Ordered   11/03/17 1648  Full code  Continuous     11/03/17 1648    Code Status History    Date Active Date Inactive Code Status Order ID Comments User Context   10/09/2017 2031 10/10/2017 1831 Full Code [id number]  Norval Morton, MD ED   07/23/2017 1509 07/24/2017 1809 Full Code [id number]   Phillips Grout, MD Inpatient   11/27/2016 2203 11/28/2016 2154 Full Code [id number]  Rise Patience, MD Inpatient        IV Access:    Peripheral IV   Procedures and diagnostic studies:   No results found.   Medical Consultants:    None.  Anti-Infectives:   None  Subjective:    Jean Hodges nonverbal.  Objective:    Vitals:   11/04/17 0541 11/04/17 0829 11/04/17 0832 11/04/17 1054  BP: (!) 143/43 (!) 127/41 (!) 127/41   Pulse: 78 78 78   Resp: 12     Temp:      TempSrc:      SpO2: 96%  96%   Weight: 49.7 kg (109 lb 8 oz)     Height:    5\' 1"  (1.549 m)    Intake/Output Summary (Last 24 hours) at 11/04/2017 1236 Last data filed at 11/04/2017 1202 Gross per 24 hour  Intake 1846.66 ml  Output 140 ml  Net 1706.66 ml   Filed Weights   11/03/17 1147 11/04/17 0541  Weight: 48.1 kg (106 lb) 49.7 kg (109 lb 8 oz)    Exam: General exam: In no acute distress. Respiratory system: Good air movement and clear to auscultation. Cardiovascular system: S1 & S2 heard, RRR.  Gastrointestinal system: Abdomen is nondistended, soft and nontender.  Central nervous system: Alert and oriented. No focal neurological deficits. Extremities: No pedal edema. Skin: No rashes, lesions or ulcers    Data Reviewed:    Labs: Basic Metabolic Panel: Recent Labs  Lab 11/03/17 1148 11/04/17  0631  NA 139 144  K 5.2* 3.2*  CL 106 111  CO2 26 24  GLUCOSE 89 52*  BUN 14 14  CREATININE 0.46 0.46  CALCIUM 8.8* 8.3*   GFR Estimated Creatinine Clearance: 41.6 mL/min (by C-G formula based on SCr of 0.46 mg/dL). Liver Function Tests: Recent Labs  Lab 11/03/17 1148 11/04/17 0631  AST 54* 40  ALT 39 34  ALKPHOS 367* 292*  BILITOT 1.3* 1.2  PROT 6.2* 5.3*  ALBUMIN 2.7* 2.4*   No results for input(s): LIPASE, AMYLASE in the last 168 hours. No results for input(s): AMMONIA in the last 168 hours. Coagulation profile No results for input(s): INR, PROTIME in the last  168 hours.  CBC: Recent Labs  Lab 11/03/17 1148 11/04/17 0440 11/04/17 0631  WBC 5.4 TEST REQUEST RECEIVED WITHOUT APPROPRIATE SPECIMEN 5.7  HGB 8.2* TEST REQUEST RECEIVED WITHOUT APPROPRIATE SPECIMEN 7.1*  HCT 24.9* TEST REQUEST RECEIVED WITHOUT APPROPRIATE SPECIMEN 21.1*  MCV 103.8* TEST REQUEST RECEIVED WITHOUT APPROPRIATE SPECIMEN 103.4*  PLT 138* TECH ERROR 104*   Cardiac Enzymes: No results for input(s): CKTOTAL, CKMB, CKMBINDEX, TROPONINI in the last 168 hours. BNP (last 3 results) No results for input(s): PROBNP in the last 8760 hours. CBG: Recent Labs  Lab 11/03/17 1350 11/04/17 0740 11/04/17 0836 11/04/17 0935  GLUCAP 71 42* 65 87   D-Dimer: No results for input(s): DDIMER in the last 72 hours. Hgb A1c: No results for input(s): HGBA1C in the last 72 hours. Lipid Profile: No results for input(s): CHOL, HDL, LDLCALC, TRIG, CHOLHDL, LDLDIRECT in the last 72 hours. Thyroid function studies: No results for input(s): TSH, T4TOTAL, T3FREE, THYROIDAB in the last 72 hours.  Invalid input(s): FREET3 Anemia work up: No results for input(s): VITAMINB12, FOLATE, FERRITIN, TIBC, IRON, RETICCTPCT in the last 72 hours. Sepsis Labs: Recent Labs  Lab 11/03/17 1148 11/03/17 1253 11/04/17 0440 11/04/17 0631  WBC 5.4  --  TEST REQUEST RECEIVED WITHOUT APPROPRIATE SPECIMEN 5.7  LATICACIDVEN  --  0.66  --   --    Microbiology No results found for this or any previous visit (from the past 240 hour(s)).   Medications:   . carvedilol  12.5 mg Oral BID WC  . Chlorhexidine Gluconate Cloth  6 each Topical Q0600  . cholecalciferol  2,000 Units Oral Daily  . ferrous sulfate  325 mg Oral BID WC  . levETIRAcetam  500 mg Oral BID  . levothyroxine  100 mcg Oral QAC breakfast  . mirtazapine  15 mg Oral QHS  . mupirocin ointment  1 application Nasal BID  . pantoprazole  40 mg Oral Daily  . sertraline  75 mg Oral Daily   Continuous Infusions: . sodium chloride 75 mL/hr at  11/04/17 0524  . cefTRIAXone (ROCEPHIN)  IV 1 g (11/04/17 1231)      LOS: 1 day   Charlynne Cousins  [facility] Pager [phone]  *Please refer to [email or web address], password [email or web address] to get updated schedule on who will round on this patient, as hospitalists switch teams weekly. If 7PM-7AM, please contact night-coverage at [email or web address], password [email or web address] for any overnight needs.  11/04/2017, 12:36 PM

## 2017-11-04 NOTE — Progress Notes (Signed)
Pt right upper arm IV infiltrated, RUE is edematous, +1 edema. RUE elevated. Will continue to monitor.  Jean Hodges

## 2017-11-05 DIAGNOSIS — Z515 Encounter for palliative care: Secondary | ICD-10-CM

## 2017-11-05 DIAGNOSIS — Z7189 Other specified counseling: Secondary | ICD-10-CM

## 2017-11-05 DIAGNOSIS — D649 Anemia, unspecified: Secondary | ICD-10-CM

## 2017-11-05 DIAGNOSIS — L8941 Pressure ulcer of contiguous site of back, buttock and hip, stage 1: Secondary | ICD-10-CM

## 2017-11-05 LAB — GLUCOSE, CAPILLARY
GLUCOSE-CAPILLARY: 105 mg/dL — AB (ref 65–99)
GLUCOSE-CAPILLARY: 110 mg/dL — AB (ref 65–99)
GLUCOSE-CAPILLARY: 144 mg/dL — AB (ref 65–99)
Glucose-Capillary: 130 mg/dL — ABNORMAL HIGH (ref 65–99)
Glucose-Capillary: 105 mg/dL — ABNORMAL HIGH (ref 65–99)
Glucose-Capillary: 91 mg/dL (ref 65–99)

## 2017-11-05 MED ORDER — LORAZEPAM 2 MG/ML IJ SOLN
0.5000 mg | Freq: Once | INTRAMUSCULAR | Status: AC
  Administered 2017-11-05: 0.5 mg via INTRAVENOUS
  Filled 2017-11-05: qty 1

## 2017-11-05 NOTE — Progress Notes (Signed)
Patient ID: Jean Hodges, female   DOB: Aug 31, 1933, 84 y.o.   MRN: [id number]  PROGRESS NOTE    Jean Hodges  JSE:[id number] DOB: 1934-02-11 DOA: 11/03/2017 PCP: Patient, No Pcp Per   Brief Narrative:  84 year old female with history of peripheral vascular disease, Alzheimer's dementia, chronic atrial fibrillation, chronic systolic heart failure, oropharyngeal dysphagia presented from SNF with altered mental status and poor oral intake.  She was started on intravenous antibiotics for probable UTI   Assessment & Plan:   Active Problems:   Moderate protein-calorie malnutrition ([place])   Type 2 diabetes mellitus with peripheral vascular disease ([facility])   Alzheimer's dementia   UTI (urinary tract infection)   Pressure injury of skin   Hyperkalemia  Acute metabolic encephalopathy -In a patient with dementia.  Probably from UTI -Patient still very confused.  Monitor mental status.  Fall precautions  Recurrent UTI -Continue Rocephin.  Follow cultures  Moderate protein calorie malnutrition -Follow nutrition recommendations  Alzheimer's dementia without behavioral disturbances -Monitor mental status.  -Overall prognosis is guarded to poor as patient's oral intake is very poor.  Palliative care consulted.  Hyperkalemia -Resolved.  No labs today.  Repeat a.m. Labs  Diabetes mellitus type 2 with peripheral vascular disease -Continue Accu-Cheks.  Blood sugars stable  Stage I pressure injury over her coccyx -Continue skin care   Mild drop in hemoglobin in a patient with chronic anemia -Status post 2 units transfusion on 11/04/2017.  Repeat a.m. hemoglobin.   DVT prophylaxis: SCDs Code Status: Full Family Communication: None at bedside Disposition Plan: Depends on clinical outcome  Consultants: Palliative care  Procedures: None  Antimicrobials:  Anti-infectives (From admission, onward)   Start     Dose/Rate Route Frequency Ordered Stop   11/03/17 1300  cefTRIAXone (ROCEPHIN) 1 g  in sodium chloride 0.9 % 100 mL IVPB     1 g 200 mL/hr over 30 Minutes Intravenous Every 24 hours 11/03/17 1255           Subjective: Patient seen and examined at bedside.  She is sleepy, wakes up slightly but hardly answers any questions.  No overnight fever or vomiting.  Objective: Vitals:   11/05/17 0004 11/05/17 0045 11/05/17 0125 11/05/17 0445  BP: (!) 159/61 (!) 151/60 (!) 153/69 (!) 152/74  Pulse: 81 76 79 80  Resp: 18 20 20 18   Temp: 97.9 F (36.6 C) 98.6 F (37 C) 98.9 F (37.2 C) 98.1 F (36.7 C)  TempSrc: Oral Oral Oral Oral  SpO2: 95% 98% 99% 95%  Weight:      Height:        Intake/Output Summary (Last 24 hours) at 11/05/2017 1339 Last data filed at 11/05/2017 0700 Gross per 24 hour  Intake 3230 ml  Output -  Net 3230 ml   Filed Weights   11/03/17 1147 11/04/17 0541  Weight: 48.1 kg (106 lb) 49.7 kg (109 lb 8 oz)    Examination:  General exam: Very thinly built elderly female, sleepy, wakes up slightly but hardly answers any questions. Respiratory system: Bilateral decreased breath sound at bases Cardiovascular system: S1 & S2 heard, rate controlled  gastrointestinal system: Abdomen is nondistended, soft and nontender. Normal bowel sounds heard. Extremities: No cyanosis, clubbing, edema.  Right BKA    Data Reviewed: I have personally reviewed following labs and imaging studies  CBC: Recent Labs  Lab 11/03/17 1148 11/04/17 0440 11/04/17 0631  WBC 5.4 TEST REQUEST RECEIVED WITHOUT APPROPRIATE SPECIMEN 5.7  HGB 8.2* TEST REQUEST RECEIVED WITHOUT APPROPRIATE SPECIMEN  7.1*  HCT 24.9* TEST REQUEST RECEIVED WITHOUT APPROPRIATE SPECIMEN 21.1*  MCV 103.8* TEST REQUEST RECEIVED WITHOUT APPROPRIATE SPECIMEN 103.4*  PLT 138* TECH ERROR 102*   Basic Metabolic Panel: Recent Labs  Lab 11/03/17 1148 11/04/17 0631  NA 139 144  K 5.2* 3.2*  CL 106 111  CO2 26 24  GLUCOSE 89 52*  BUN 14 14  CREATININE 0.46 0.46  CALCIUM 8.8* 8.3*   GFR: Estimated  Creatinine Clearance: 41.6 mL/min (by C-G formula based on SCr of 0.46 mg/dL). Liver Function Tests: Recent Labs  Lab 11/03/17 1148 11/04/17 0631  AST 54* 40  ALT 39 34  ALKPHOS 367* 292*  BILITOT 1.3* 1.2  PROT 6.2* 5.3*  ALBUMIN 2.7* 2.4*   No results for input(s): LIPASE, AMYLASE in the last 168 hours. No results for input(s): AMMONIA in the last 168 hours. Coagulation Profile: No results for input(s): INR, PROTIME in the last 168 hours. Cardiac Enzymes: No results for input(s): CKTOTAL, CKMB, CKMBINDEX, TROPONINI in the last 168 hours. BNP (last 3 results) No results for input(s): PROBNP in the last 8760 hours. HbA1C: No results for input(s): HGBA1C in the last 72 hours. CBG: Recent Labs  Lab 11/04/17 2004 11/05/17 0002 11/05/17 0523 11/05/17 0727 11/05/17 1111  GLUCAP 120* 144* 130* 105* 91   Lipid Profile: No results for input(s): CHOL, HDL, LDLCALC, TRIG, CHOLHDL, LDLDIRECT in the last 72 hours. Thyroid Function Tests: No results for input(s): TSH, T4TOTAL, FREET4, T3FREE, THYROIDAB in the last 72 hours. Anemia Panel: No results for input(s): VITAMINB12, FOLATE, FERRITIN, TIBC, IRON, RETICCTPCT in the last 72 hours. Sepsis Labs: Recent Labs  Lab 11/03/17 1253  LATICACIDVEN 0.66    Recent Results (from the past 240 hour(s))  Urine culture     Status: Abnormal   Collection Time: 11/03/17 12:18 PM  Result Value Ref Range Status   Specimen Description   Final    URINE, CATHETERIZED Performed at [facility] [address] [place], [place] [address]    Special Requests   Final    NONE Performed at [facility], [place] [address] [place],  [address]    Culture MULTIPLE ORGANISMS PRESENT, NONE PREDOMINANT (A)  Final   Report Status 11/04/2017 FINAL  Final         Radiology Studies: No results found.      Scheduled Meds: . carvedilol  12.5 mg Oral BID WC  . Chlorhexidine Gluconate Cloth  6 each  Topical Q0600  . cholecalciferol  2,000 Units Oral Daily  . feeding supplement (ENSURE ENLIVE)  237 mL Oral TID BM  . ferrous sulfate  325 mg Oral BID WC  . levETIRAcetam  500 mg Oral BID  . levothyroxine  100 mcg Oral QAC breakfast  . mirtazapine  15 mg Oral QHS  . mupirocin ointment  1 application Nasal BID  . pantoprazole  40 mg Oral Daily  . polyethylene glycol  17 g Oral Daily  . senna-docusate  2 tablet Oral BID  . sertraline  75 mg Oral Daily   Continuous Infusions: . sodium chloride 75 mL/hr at 11/05/17 1007  . cefTRIAXone (ROCEPHIN)  IV Stopped (11/05/17 1250)     LOS: 2 days        Aline August, MD [facility] Pager [phone]  If 7PM-7AM, please contact night-coverage [email or web address] Password [email or web address] 11/05/2017, 1:39 PM

## 2017-11-05 NOTE — Consult Note (Signed)
Consultation Note Date: 11/05/17  Patient Name: Jean Hodges  DOB: [birth year]  MRN: [id number]  Age / Sex: 84 y.o., female  PCP: Patient, No Pcp Per Referring Physician: Aline August, MD  Reason for Consultation: Establishing goals of care  HPI/Patient Profile: 84 y.o. female  with past medical history of Alzheimer's dementia, seizures, anemia, systolic CHF, cardiomyopathy, pacemaker, CAD, HTN, HLD, DM, PVD admitted on 11/03/2017 with altered mental status and poor oral intake. In ED, patient with hyperkalemia, Hgb 8, and FOBT positive. Found to have UTI and receiving IV rocephin. Hgb dropped to 7.1 on 4/23 and patient receiving 2 units PRBC. Underlying dementia. Dietician following for protein calorie malnutrition. PT/SLP evaluations limited by patient fatigue and lethargy. Palliative medicine consultation for goals of care.   Clinical Assessment and Goals of Care: I have reviewed medical records, discussed with care team, and spoke with son Delfino Lovett) via telephone. Richard works near [place] and will not be at the hospital again until Friday evening.   Introduced Palliative Medicine as specialized medical care for people living with serious illness. It focuses on providing relief from the symptoms and stress of a serious illness. The goal is to improve quality of life for both the patient and the family.  We discussed a brief life review of the patient. Two adult children. Neither local. Richard recalls a decline in her health since March 2018 after a prolonged hospitalization for possible stroke and seizures. She has been at [facility] since then. He tells me she has never received an "official" dementia diagnosis from a doctor. Baseline prior to admission, patient requiring assist with ADL's and limited to bed/wheelchair. She requires great encouragement with meals and experiences dysphagia.   Discussed  hospital diagnoses and interventions. Discussed concern that she is not eating/drinking and has been unable to work with physical therapy.   Delfino Lovett is most concerned about her anemia and FOBT positive result. He speaks of her being followed by an outpatient specialist for many years and monitoring her anemia to ensure it was cancer. Even considered performing a bone marrow biopsy in the past. Delfino Lovett believes she is not eating/drinking because of her anemia. He feels more should be done regarding her positive FOBT including an "ultrasound" or "colonoscopy." I explained that his mother will have repeat labs tomorrow morning and will further discuss with attending his concerns.   Richard requests I add his sister's number (Cydni Beam) to contact list.   Questions and concerns were addressed. I reassured Richard that I would continue to follow and update him on clinical status.    SUMMARY OF RECOMMENDATIONS    Continue FULL code/FULL scope  Initial palliative discussion.  Son believes her anemia is contributing to AMS and her refusal to eat. He feels more should be done regarding her anemia and positive FOBT.   PMT will follow and further discuss with attending.   Code Status/Advance Care Planning:  Full code  Symptom Management:   Per attending  Palliative Prophylaxis:   Aspiration, Delirium Protocol, Oral Care  and Turn Reposition  Psycho-social/Spiritual:   Desire for further Chaplaincy support: yes  Additional Recommendations: Caregiving  Support/Resources and Education on Hospice  Prognosis:   Unable to determine  Discharge Planning: To Be Determined      Primary Diagnoses: Present on Admission: . UTI (urinary tract infection) . Type 2 diabetes mellitus with peripheral vascular disease ([place]) . Moderate protein-calorie malnutrition ([place])   I have reviewed the medical record, interviewed the patient and family, and examined the patient. The following aspects are  pertinent.  Past Medical History:  Diagnosis Date  . Acute encephalopathy   . Acute encephalopathy 09/30/2016  . Alzheimer's dementia without behavioral disturbance   . Alzheimer's dementia without behavioral disturbance 10/07/2016  . Anemia   . Anemia 01/28/2014  . Atherosclerotic PVD with ulceration ([place])   . CAD (coronary artery disease)   . CAD (coronary artery disease) 01/04/2014  . Cellulitis   . Cellulitis of left leg   . Diabetes mellitus without complication ([place])   . DM (diabetes mellitus) ([place]) 01/27/2014  . HTN (hypertension) 01/04/2014  . Hyperlipidemia   . Hypertension   . Hypoglycemia associated with type 2 diabetes mellitus ([place])   . Macrocytic anemia   . Seizures ([place]) 09/30/2016  . Tear of left rotator cuff   . UTI (urinary tract infection)   . UTI (urinary tract infection) 09/25/2016   Social History   Socioeconomic History  . Marital status: Widowed    Spouse name: Not on file  . Number of children: Not on file  . Years of education: Not on file  . Highest education level: Not on file  Occupational History  . Not on file  Social Needs  . Financial resource strain: Not on file  . Food insecurity:    Worry: Not on file    Inability: Not on file  . Transportation needs:    Medical: Not on file    Non-medical: Not on file  Tobacco Use  . Smoking status: Never Smoker  . Smokeless tobacco: Never Used  Substance and Sexual Activity  . Alcohol use: No  . Drug use: No  . Sexual activity: Not on file  Lifestyle  . Physical activity:    Days per week: Not on file    Minutes per session: Not on file  . Stress: Not on file  Relationships  . Social connections:    Talks on phone: Not on file    Gets together: Not on file    Attends religious service: Not on file    Active member of club or organization: Not on file    Attends meetings of clubs or organizations: Not on file    Relationship status: Not on file  Other Topics Concern  . Not on file    Social History Narrative   Lives at [facility] and [place] [address]   Phone: [phone]   Caffeine use:    ** Merged History Encounter **       Family History  Family history unknown: Yes   Scheduled Meds: . carvedilol  12.5 mg Oral BID WC  . Chlorhexidine Gluconate Cloth  6 each Topical Q0600  . cholecalciferol  2,000 Units Oral Daily  . feeding supplement (ENSURE ENLIVE)  237 mL Oral TID BM  . ferrous sulfate  325 mg Oral BID WC  . levETIRAcetam  500 mg Oral BID  . levothyroxine  100 mcg Oral QAC breakfast  . mirtazapine  15 mg Oral QHS  .  mupirocin ointment  1 application Nasal BID  . pantoprazole  40 mg Oral Daily  . polyethylene glycol  17 g Oral Daily  . senna-docusate  2 tablet Oral BID  . sertraline  75 mg Oral Daily   Continuous Infusions: . sodium chloride 50 mL/hr at 11/05/17 1413  . cefTRIAXone (ROCEPHIN)  IV Stopped (11/05/17 1250)   PRN Meds:.ketorolac, ondansetron Medications Prior to Admission:  Prior to Admission medications   Medication Sig Start Date End Date Taking? Authorizing Provider  carvedilol (COREG) 12.5 MG tablet Take 12.5 mg by mouth 2 (two) times daily with a meal. Hold for SBP<110 and/or pulse <60/min   Yes [provider]  Cholecalciferol (VITAMIN D3) 2000 units TABS Take 2,000 Units by mouth daily.   Yes [provider]  ferrous sulfate 325 (65 FE) MG tablet Take 325 mg by mouth 2 (two) times daily with a meal.   Yes [provider]  levETIRAcetam (KEPPRA) 500 MG tablet Take 500 mg by mouth 2 (two) times daily.   Yes [provider]  levothyroxine (SYNTHROID, LEVOTHROID) 100 MCG tablet Take 100 mcg by mouth daily before breakfast.   Yes [provider]  mirtazapine (REMERON) 15 MG tablet Take 15 mg by mouth at bedtime.   Yes [provider]  Multiple Vitamins-Minerals (DECUBI-VITE PO) Take 1 capsule by mouth daily.   Yes [provider]  ondansetron (ZOFRAN) 4 MG tablet Take 4  mg by mouth every 6 (six) hours as needed for nausea or vomiting.   Yes [provider]  pantoprazole (PROTONIX) 40 MG tablet Take 1 tablet (40 mg total) by mouth daily. 07/25/17  Yes Barton Dubois, MD  polyethylene glycol [facility] / GLYCOLAX) packet Take 17 g by mouth daily.   Yes [provider]  sennosides-docusate sodium (SENOKOT-S) 8.6-50 MG tablet Take 2 tablets by mouth 2 (two) times daily.   Yes [provider]  sertraline (ZOLOFT) 50 MG tablet Take 75 mg by mouth daily.   Yes [provider]  feeding supplement, ENSURE ENLIVE, (ENSURE ENLIVE) LIQD Take 237 mLs by mouth 3 (three) times daily between meals. Patient not taking: Reported on 11/03/2017 10/10/17   Yaakov Guthrie, MD  mupirocin ointment (BACTROBAN) 2 % Place 1 application into the nose 2 (two) times daily. Patient not taking: Reported on 11/03/2017 10/10/17   Yaakov Guthrie, MD   Allergies  Allergen Reactions  . Acetaminophen   . Codeine Nausea Only  . Dilaudid [Hydromorphone]   . Hydrocodone     Hallucinations  . Keflex [Cephalexin] Diarrhea   Review of Systems  Unable to perform ROS: Dementia   Physical Exam  Vital Signs: BP (!) 125/102 (BP Location: Left Arm)   Pulse 79   Temp (!) 97.4 F (36.3 C) (Axillary)   Resp 16   Ht 5' 1" (1.549 m)   Wt 49.7 kg (109 lb 8 oz)   SpO2 96%   BMI 20.69 kg/m  Pain Scale: 0-10   Pain Score: Asleep   SpO2: SpO2: 96 % O2 Device:SpO2: 96 % O2 Flow Rate: .   IO: Intake/output summary:   Intake/Output Summary (Last 24 hours) at 11/05/2017 1615 Last data filed at 11/05/2017 1413 Gross per 24 hour  Intake 2758.75 ml  Output -  Net 2758.75 ml    LBM: Last BM Date: 11/03/17 Baseline Weight: Weight: 48.1 kg (106 lb) Most recent weight: Weight: 49.7 kg (109 lb 8 oz)     Palliative Assessment/Data: PPS 20%  Time In: 1600 Time Out: 1650 Time Total: 50 min Greater than 50%  of this time was spent counseling and coordinating  care related to the above assessment and plan.  Signed by:  Ihor Dow, FNP-C Palliative Medicine Team  Phone: [phone] Fax: [phone]   Please contact Palliative Medicine Team phone at [phone] for questions and concerns.  For individual provider: See Shea Evans

## 2017-11-05 NOTE — Progress Notes (Signed)
PT Cancellation Note  Patient Details Name: Safiya Girdler MRN: [id number] DOB: 02/23/1934   Cancelled Treatment:    Reason Eval/Treat Not Completed: Other (comment) RN reports that patient has been very fatigued and somnolent, requests that PT hold for today. Plan to attempt to try back on next day of service as able.    Deniece Ree PT, DPT, CBIS  Supplemental Physical Therapist [facility]   Pager [phone]

## 2017-11-06 ENCOUNTER — Inpatient Hospital Stay (HOSPITAL_COMMUNITY): Payer: Medicare Other

## 2017-11-06 DIAGNOSIS — N390 Urinary tract infection, site not specified: Principal | ICD-10-CM

## 2017-11-06 DIAGNOSIS — R4182 Altered mental status, unspecified: Secondary | ICD-10-CM

## 2017-11-06 DIAGNOSIS — Z515 Encounter for palliative care: Secondary | ICD-10-CM

## 2017-11-06 LAB — BASIC METABOLIC PANEL
ANION GAP: 7 (ref 5–15)
BUN: 16 mg/dL (ref 6–20)
CALCIUM: 8 mg/dL — AB (ref 8.9–10.3)
CO2: 22 mmol/L (ref 22–32)
Chloride: 114 mmol/L — ABNORMAL HIGH (ref 101–111)
Creatinine, Ser: 0.51 mg/dL (ref 0.44–1.00)
Glucose, Bld: 137 mg/dL — ABNORMAL HIGH (ref 65–99)
Potassium: 3.4 mmol/L — ABNORMAL LOW (ref 3.5–5.1)
SODIUM: 143 mmol/L (ref 135–145)

## 2017-11-06 LAB — CBC WITH DIFFERENTIAL/PLATELET
BASOS ABS: 0 10*3/uL (ref 0.0–0.1)
Basophils Relative: 0 %
EOS ABS: 0.1 10*3/uL (ref 0.0–0.7)
Eosinophils Relative: 1 %
HCT: 33.1 % — ABNORMAL LOW (ref 36.0–46.0)
HEMOGLOBIN: 11 g/dL — AB (ref 12.0–15.0)
LYMPHS ABS: 1.8 10*3/uL (ref 0.7–4.0)
Lymphocytes Relative: 25 %
MCH: 32.2 pg (ref 26.0–34.0)
MCHC: 33.2 g/dL (ref 30.0–36.0)
MCV: 96.8 fL (ref 78.0–100.0)
Monocytes Absolute: 0.3 10*3/uL (ref 0.1–1.0)
Monocytes Relative: 4 %
NEUTROS ABS: 5.1 10*3/uL (ref 1.7–7.7)
Neutrophils Relative %: 70 %
Platelets: 100 10*3/uL — ABNORMAL LOW (ref 150–400)
RBC: 3.42 MIL/uL — AB (ref 3.87–5.11)
RDW: 19.8 % — AB (ref 11.5–15.5)
WBC: 7.3 10*3/uL (ref 4.0–10.5)

## 2017-11-06 LAB — TYPE AND SCREEN
ABO/RH(D): A POS
Antibody Screen: NEGATIVE
UNIT DIVISION: 0
Unit division: 0

## 2017-11-06 LAB — BPAM RBC
BLOOD PRODUCT EXPIRATION DATE: 201905192359
Blood Product Expiration Date: 201905192359
ISSUE DATE / TIME: 201904231451
ISSUE DATE / TIME: 201904240032
UNIT TYPE AND RH: 6200
UNIT TYPE AND RH: 6200

## 2017-11-06 LAB — GLUCOSE, CAPILLARY
GLUCOSE-CAPILLARY: 208 mg/dL — AB (ref 65–99)
GLUCOSE-CAPILLARY: 79 mg/dL (ref 65–99)
GLUCOSE-CAPILLARY: 82 mg/dL (ref 65–99)
GLUCOSE-CAPILLARY: 85 mg/dL (ref 65–99)
Glucose-Capillary: 65 mg/dL (ref 65–99)
Glucose-Capillary: 69 mg/dL (ref 65–99)
Glucose-Capillary: 79 mg/dL (ref 65–99)
Glucose-Capillary: 99 mg/dL (ref 65–99)

## 2017-11-06 LAB — MAGNESIUM: MAGNESIUM: 1.3 mg/dL — AB (ref 1.7–2.4)

## 2017-11-06 MED ORDER — LEVOTHYROXINE SODIUM 100 MCG IV SOLR
50.0000 ug | Freq: Every day | INTRAVENOUS | Status: DC
  Administered 2017-11-08 – 2017-11-09 (×2): 50 ug via INTRAVENOUS
  Filled 2017-11-06 (×3): qty 5

## 2017-11-06 MED ORDER — POTASSIUM CHLORIDE CRYS ER 10 MEQ PO TBCR: 40.0000 meq | EXTENDED_RELEASE_TABLET | Freq: Once | ORAL | Status: DC

## 2017-11-06 MED ORDER — DEXTROSE 50 % IV SOLN
INTRAVENOUS | Status: AC
  Administered 2017-11-06: 50 mL
  Filled 2017-11-06: qty 50

## 2017-11-06 MED ORDER — ONDANSETRON HCL 4 MG/2ML IJ SOLN: 4.0000 mg | Freq: Four times a day (QID) | INTRAMUSCULAR | Status: DC | PRN

## 2017-11-06 MED ORDER — POTASSIUM CHLORIDE 10 MEQ/100ML IV SOLN
10.0000 meq | INTRAVENOUS | Status: AC
  Administered 2017-11-06 (×2): 10 meq via INTRAVENOUS
  Filled 2017-11-06: qty 100

## 2017-11-06 MED ORDER — LEVETIRACETAM IN NACL 500 MG/100ML IV SOLN
500.0000 mg | Freq: Two times a day (BID) | INTRAVENOUS | Status: DC
  Administered 2017-11-06 – 2017-11-09 (×6): 500 mg via INTRAVENOUS
  Filled 2017-11-06 (×7): qty 100

## 2017-11-06 MED ORDER — DEXTROSE-NACL 5-0.45 % IV SOLN
INTRAVENOUS | Status: DC
  Administered 2017-11-06 – 2017-11-08 (×3): via INTRAVENOUS

## 2017-11-06 MED ORDER — HALOPERIDOL LACTATE 5 MG/ML IJ SOLN
1.0000 mg | Freq: Four times a day (QID) | INTRAMUSCULAR | Status: DC | PRN
  Filled 2017-11-06: qty 1

## 2017-11-06 NOTE — Progress Notes (Signed)
Patient ID: Jean Hodges, female   DOB: [birth year], 84 y.o.   MRN: [id number]  PROGRESS NOTE    Jean Hodges  QIO:[id number] DOB: November 02, 1933 DOA: 11/03/2017 PCP: Patient, No Pcp Per   Brief Narrative:  84 year old female with history of peripheral vascular disease, Alzheimer's dementia, chronic atrial fibrillation, chronic systolic heart failure, oropharyngeal dysphagia presented from SNF with altered mental status and poor oral intake.  She was started on intravenous antibiotics for probable UTI   Assessment & Plan:   Active Problems:   Moderate protein-calorie malnutrition ([place])   Type 2 diabetes mellitus with peripheral vascular disease (HCC)   Alzheimer's dementia   UTI (urinary tract infection)   Pressure injury of skin   Hyperkalemia   Palliative care by specialist   Goals of care, counseling/discussion   Altered mental status  Acute metabolic encephalopathy -In a patient with dementia.  Probably from UTI -Patient still very confused despite being on antibiotics.  Will get a CAT scan of the brain.  EEG.  Monitor mental status.  Fall precautions  Recurrent UTI -Continue Rocephin.  Urine cultures showed multiple organisms.  Repeat urine cultures  Moderate protein calorie malnutrition -Follow nutrition recommendations  Alzheimer's dementia without behavioral disturbances -Monitor mental status.  -Overall prognosis is guarded to poor as patient's oral intake is very poor.  Palliative care consulted. -We will try and speak to the patient's son about patient's overall very poor prognosis  Hyperkalemia -Resolved.  Patient is hypokalemic today.  Will replace potassium intravenously.  Repeat a.m. labs  Hypomagnesemia -Replace.  Repeat a.m. labs  Diabetes mellitus type 2 with peripheral vascular disease -Continue Accu-Cheks.  Blood sugars stable  Stage I pressure injury over her coccyx -Continue skin care   Mild drop in hemoglobin in a patient with chronic anemia -Status  post 2 units transfusion on 11/04/2017.  Hemoglobin is 11 today.  No overt signs of GI bleeding.  Patient was supposed to be followed up with outpatient hematology.  DVT prophylaxis: SCDs Code Status: Full Family Communication: None at bedside Disposition Plan: Depends on clinical outcome  Consultants: Palliative care  Procedures: None  Antimicrobials:  Rocephin from 11/03/2017 onwards   Subjective: Patient seen and examined at bedside.  She is very sleepy, hardly wakes up on calling her name.  No evident fever, nausea or vomiting.    Objective: Vitals:   11/05/17 2021 11/06/17 0414 11/06/17 0500 11/06/17 0811  BP: (!) 158/70 (!) 157/57    Pulse: 97 79  79  Resp: 15 16    Temp: 97.7 F (36.5 C) 99.9 F (37.7 C)    TempSrc: Axillary     SpO2: 97% 96%  95%  Weight:   47.2 kg (104 lb 0.9 oz)   Height:        Intake/Output Summary (Last 24 hours) at 11/06/2017 1213 Last data filed at 11/06/2017 0600 Gross per 24 hour  Intake 1330.42 ml  Output -  Net 1330.42 ml   Filed Weights   11/03/17 1147 11/04/17 0541 11/06/17 0500  Weight: 48.1 kg (106 lb) 49.7 kg (109 lb 8 oz) 47.2 kg (104 lb 0.9 oz)    Examination:  General exam: Very thinly built elderly female, sleepy, hardly wakes up on calling her name respiratory system: Bilateral decreased breath sound at bases with scattered crackles Cardiovascular system: Rate controlled, S1-S2 positive  gastrointestinal system: Abdomen is nondistended, soft and nontender. Normal bowel sounds heard. Extremities: No cyanosis, clubbing, edema.  Right BKA    Data Reviewed: I  have personally reviewed following labs and imaging studies  CBC: Recent Labs  Lab 11/03/17 1148 11/04/17 0440 11/04/17 0631 11/06/17 0544  WBC 5.4 TEST REQUEST RECEIVED WITHOUT APPROPRIATE SPECIMEN 5.7 7.3  NEUTROABS  --   --   --  5.1  HGB 8.2* TEST REQUEST RECEIVED WITHOUT APPROPRIATE SPECIMEN 7.1* 11.0*  HCT 24.9* TEST REQUEST RECEIVED WITHOUT APPROPRIATE  SPECIMEN 21.1* 33.1*  MCV 103.8* TEST REQUEST RECEIVED WITHOUT APPROPRIATE SPECIMEN 103.4* 96.8  PLT 138* TECH ERROR 104* 353*   Basic Metabolic Panel: Recent Labs  Lab 11/03/17 1148 11/04/17 0631 11/06/17 0544  NA 139 144 143  K 5.2* 3.2* 3.4*  CL 106 111 114*  CO2 26 24 22   GLUCOSE 89 52* 137*  BUN 14 14 16   CREATININE 0.46 0.46 0.51  CALCIUM 8.8* 8.3* 8.0*  MG  --   --  1.3*   GFR: Estimated Creatinine Clearance: 41.1 mL/min (by C-G formula based on SCr of 0.51 mg/dL). Liver Function Tests: Recent Labs  Lab 11/03/17 1148 11/04/17 0631  AST 54* 40  ALT 39 34  ALKPHOS 367* 292*  BILITOT 1.3* 1.2  PROT 6.2* 5.3*  ALBUMIN 2.7* 2.4*   No results for input(s): LIPASE, AMYLASE in the last 168 hours. No results for input(s): AMMONIA in the last 168 hours. Coagulation Profile: No results for input(s): INR, PROTIME in the last 168 hours. Cardiac Enzymes: No results for input(s): CKTOTAL, CKMB, CKMBINDEX, TROPONINI in the last 168 hours. BNP (last 3 results) No results for input(s): PROBNP in the last 8760 hours. HbA1C: No results for input(s): HGBA1C in the last 72 hours. CBG: Recent Labs  Lab 11/06/17 0004 11/06/17 0412 11/06/17 0428 11/06/17 0720 11/06/17 1112  GLUCAP 79 65 208* 99 69   Lipid Profile: No results for input(s): CHOL, HDL, LDLCALC, TRIG, CHOLHDL, LDLDIRECT in the last 72 hours. Thyroid Function Tests: No results for input(s): TSH, T4TOTAL, FREET4, T3FREE, THYROIDAB in the last 72 hours. Anemia Panel: No results for input(s): VITAMINB12, FOLATE, FERRITIN, TIBC, IRON, RETICCTPCT in the last 72 hours. Sepsis Labs: Recent Labs  Lab 11/03/17 1253  LATICACIDVEN 0.66    Recent Results (from the past 240 hour(s))  Urine culture     Status: Abnormal   Collection Time: 11/03/17 12:18 PM  Result Value Ref Range Status   Specimen Description   Final    URINE, CATHETERIZED Performed at [facility] [address],  [place], [place] [address]    Special Requests   Final    NONE Performed at [facility], [place] [address] [place], [place] [address]    Culture MULTIPLE ORGANISMS PRESENT, NONE PREDOMINANT (A)  Final   Report Status 11/04/2017 FINAL  Final         Radiology Studies: No results found.      Scheduled Meds: . carvedilol  12.5 mg Oral BID WC  . Chlorhexidine Gluconate Cloth  6 each Topical Q0600  . cholecalciferol  2,000 Units Oral Daily  . feeding supplement (ENSURE ENLIVE)  237 mL Oral TID BM  . ferrous sulfate  325 mg Oral BID WC  . levETIRAcetam  500 mg Oral BID  . levothyroxine  100 mcg Oral QAC breakfast  . mirtazapine  15 mg Oral QHS  . mupirocin ointment  1 application Nasal BID  . pantoprazole  40 mg Oral Daily  . polyethylene glycol  17 g Oral Daily  . senna-docusate  2 tablet Oral BID  . sertraline  75 mg  Oral Daily   Continuous Infusions: . cefTRIAXone (ROCEPHIN)  IV Stopped (11/05/17 1250)  . dextrose 5 % and 0.45% NaCl 50 mL/hr at 11/06/17 1201  . potassium chloride       LOS: 3 days        Aline August, MD [facility] Pager [phone]  If 7PM-7AM, please contact night-coverage [email or web address] Password [facility] 11/06/2017, 12:13 PM

## 2017-11-06 NOTE — NC FL2 (Signed)
[place] LEVEL OF CARE SCREENING TOOL     IDENTIFICATION  Patient Name: Jean Hodges Birthdate: 02-Oct-1933 Sex: female Admission Date (Current Location): 11/03/2017  [facility] and [place] Number:  Herbalist and Address:  [facility],  [place] [address] [place]      Provider Number: [id number]  Attending Physician Name and Address:  Aline August, MD  Relative Name and Phone Number:       Current Level of Care: Hospital Recommended Level of Care: [place] Prior Approval Number:    Date Approved/Denied: 11/06/17 PASRR Number:    Discharge Plan: SNF    Current Diagnoses: Patient Active Problem List   Diagnosis Date Noted  . Palliative care by specialist   . Goals of care, counseling/discussion   . Altered mental status   . Pressure injury of skin 11/04/2017  . Hyperkalemia 11/04/2017  . UTI (urinary tract infection) 11/03/2017  . Protein-calorie malnutrition, severe 10/10/2017  . Anemia 07/23/2017  . Symptomatic anemia 11/27/2016  . Tonic-clonic seizure disorder ([place]) 10/09/2016  . Oropharyngeal dysphagia 10/09/2016  . Moderate protein-calorie malnutrition ([place]) 10/09/2016  . Hypothyroidism (acquired) 10/09/2016  . Type 2 diabetes mellitus with peripheral vascular disease ([place]) 10/09/2016  . PVD (peripheral vascular disease) ([place]) 10/09/2016  . Essential hypertension 10/09/2016  . Alzheimer's dementia 10/09/2016  . Chronic depression 10/09/2016  . Hyperlipidemia 10/09/2016  . Coronary artery disease involving native coronary artery of native heart without angina pectoris 10/09/2016    Orientation RESPIRATION BLADDER Height & Weight     Self  Normal Continent, External catheter Weight: 104 lb 0.9 oz (47.2 kg) Height:  5\' 1"  (154.9 cm)  BEHAVIORAL SYMPTOMS/MOOD NEUROLOGICAL BOWEL NUTRITION STATUS      Continent Diet(see dc summary)  AMBULATORY STATUS COMMUNICATION OF NEEDS Skin   Total Care Verbally  PU Stage and Appropriate Care([place]occyx) PU Stage 1 Dressing: (Foam dressing)                     Personal Care Assistance Level of Assistance  Bathing, Feeding, Dressing, Total care Bathing Assistance: Maximum assistance Feeding assistance: Maximum assistance Dressing Assistance: Maximum assistance Total Care Assistance: Maximum assistance   Functional Limitations Info  Sight, Hearing, Speech Sight Info: Impaired Hearing Info: Impaired Speech Info: Adequate    SPECIAL CARE FACTORS FREQUENCY                       Contractures      Additional Factors Info  Code Status, Allergies Code Status Info: Full Allergies Info: Acetaminophen, Codeine, Dilaudid Hydromorphone, Hydrocodone, Keflex Cephalexin           Current Medications (11/06/2017):  This is the current hospital active medication list Current Facility-Administered Medications  Medication Dose Route Frequency Provider Last Rate Last Dose  . carvedilol (COREG) tablet 12.5 mg  12.5 mg Oral BID WC Hall, Carole N, DO   12.5 mg at 11/05/17 1621  . cefTRIAXone (ROCEPHIN) 1 g in sodium chloride 0.9 % 100 mL IVPB  1 g Intravenous Q24H Lacretia Leigh, MD   Stopped at 11/05/17 1250  . Chlorhexidine Gluconate Cloth 2 % PADS 6 each  6 each Topical Q0600 Kayleen Memos, DO   6 each at 11/06/17 [phone]  . cholecalciferol (VITAMIN D) tablet 2,000 Units  2,000 Units Oral Daily Irene Pap N, DO   2,000 Units at 11/05/17 1002  . dextrose 5 %-0.45 % sodium chloride infusion   Intravenous Continuous  Aline August, MD 50 mL/hr at 11/06/17 1201    . feeding supplement (ENSURE ENLIVE) (ENSURE ENLIVE) liquid 237 mL  237 mL Oral TID BM Charlynne Cousins, MD   237 mL at 11/05/17 1331  . ferrous sulfate tablet 325 mg  325 mg Oral BID WC Hall, Carole N, DO   325 mg at 11/05/17 1621  . ketorolac (TORADOL) 15 MG/ML injection 15 mg  15 mg Intravenous Q8H PRN Charlynne Cousins, MD   15 mg at 11/05/17 0513  . levETIRAcetam (KEPPRA) tablet  500 mg  500 mg Oral BID Irene Pap N, DO   500 mg at 11/05/17 2200  . levothyroxine (SYNTHROID, LEVOTHROID) tablet 100 mcg  100 mcg Oral QAC breakfast Irene Pap N, DO   100 mcg at 11/05/17 0745  . mirtazapine (REMERON) tablet 15 mg  15 mg Oral QHS Hall, Carole N, DO   15 mg at 11/05/17 2200  . mupirocin ointment (BACTROBAN) 2 % 1 application  1 application Nasal BID Kayleen Memos, DO   1 application at 85/02/77 2200  . ondansetron (ZOFRAN) tablet 4 mg  4 mg Oral Q6H PRN Charlynne Cousins, MD      . pantoprazole (PROTONIX) EC tablet 40 mg  40 mg Oral Daily [place], Archie Patten N, DO   40 mg at 11/05/17 1002  . polyethylene glycol (MIRALAX / GLYCOLAX) packet 17 g  17 g Oral Daily Charlynne Cousins, MD   Stopped at 11/04/17 1314  . potassium chloride 10 mEq in 100 mL IVPB  10 mEq Intravenous Q1 Hr x 2 Alekh, Kshitiz, MD      . senna-docusate (Senokot-S) tablet 2 tablet  2 tablet Oral BID Charlynne Cousins, MD   2 tablet at 11/05/17 2200  . sertraline (ZOLOFT) tablet 75 mg  75 mg Oral Daily Irene Pap N, DO   75 mg at 11/05/17 1002     Discharge Medications: Please see discharge summary for a list of discharge medications.  Relevant Imaging Results:  Relevant Lab Results:   Additional Information ssn: [id number]  Servando Snare, LCSW

## 2017-11-06 NOTE — Progress Notes (Signed)
LCSw following for facility placement.  Patient from [facility] for long term care.   Patient is not oriented.  LCSW left message for patient's son, Delfino Lovett to complete assessment.   LCSW will continue to follow for disposition.   Carolin Coy [place]

## 2017-11-06 NOTE — Evaluation (Signed)
Physical Therapy Evaluation Patient Details Name: Jean Hodges MRN: [id number] DOB: 07/24/1933 Today's Date: 11/06/2017   History of Present Illness  84 year old female with history of peripheral vascular disease, Alzheimer's dementia, chronic atrial fibrillation, chronic systolic heart failure, oropharyngeal dysphagia presented from SNF with altered mental status and poor oral intake and UTI  Clinical Impression  Patient presents with limited participation due to AMS and going for head CT this pm.  Currently total A for rolling in bed for hygiene.  Did arouse enough to nod her head, but did not open eyes or participate in mobility in the bed.  Feel she may benefit from skilled PT in the acute setting if able to increase arousal and participation.  May need continued SNF level of care at d/c.     Follow Up Recommendations SNF;Supervision/Assistance - 24 hour    Equipment Recommendations  None recommended by PT    Recommendations for Other Services       Precautions / Restrictions Precautions Precautions: Fall      Mobility  Bed Mobility Overal bed mobility: Needs Assistance Bed Mobility: Rolling Rolling: Total assist         General bed mobility comments: assist for rolling for hygiene due to soiled with urine and feces, noted urine very dark. RN in to assist   Transfers                 General transfer comment: NT due to pt going for CT scan and unable to fully arouse  Ambulation/Gait                Stairs            Wheelchair Mobility    Modified Rankin (Stroke Patients Only)       Balance                                             Pertinent Vitals/Pain Pain Assessment: Faces Faces Pain Scale: Hurts little more Pain Location: generalized with movement in bed Pain Descriptors / Indicators: Grimacing;Moaning Pain Intervention(s): Monitored during session;Limited activity within patient's tolerance    Home Living  Family/patient expects to be discharged to:: Skilled nursing facility                      Prior Function           Comments: unknown PLOF due to pt nonverbal and lethargic today     Hand Dominance        Extremity/Trunk Assessment   Upper Extremity Assessment Upper Extremity Assessment: Generalized weakness    Lower Extremity Assessment Lower Extremity Assessment: RLE deficits/detail;LLE deficits/detail RLE Deficits / Details: R BKA with skin breakdown at distal end of residual limb, noted slight flexion contracture, moves AAROM WFL otherwise, strength NT due to cognition LLE Deficits / Details: AAROM grossly WFL tight heel cords, in prevlon boot and strength NT due to cognition       Communication      Cognition Arousal/Alertness: Lethargic Behavior During Therapy: Flat affect Overall Cognitive Status: Difficult to assess                                 General Comments: did nod her head that she was awake, but would not open eyes to command or participate  in mobility in bed this session      General Comments      Exercises     Assessment/Plan    PT Assessment Patient needs continued PT services  PT Problem List Decreased strength;Decreased mobility;Decreased activity tolerance;Decreased cognition;Decreased range of motion;Decreased skin integrity       PT Treatment Interventions DME instruction;Therapeutic activities;Therapeutic exercise;Patient/family education;Balance training;Functional mobility training    PT Goals (Current goals can be found in the Care Plan section)  Acute Rehab PT Goals Patient Stated Goal: none stated PT Goal Formulation: Patient unable to participate in goal setting Time For Goal Achievement: 11/20/17 Potential to Achieve Goals: Fair    Frequency Min 2X/week   Barriers to discharge        Co-evaluation               AM-PAC PT "6 Clicks" Daily Activity  Outcome Measure Difficulty turning  over in bed (including adjusting bedclothes, sheets and blankets)?: Unable Difficulty moving from lying on back to sitting on the side of the bed? : Unable Difficulty sitting down on and standing up from a chair with arms (e.g., wheelchair, bedside commode, etc,.)?: Unable Help needed moving to and from a bed to chair (including a wheelchair)?: Total Help needed walking in hospital room?: Total Help needed climbing 3-5 steps with a railing? : Total 6 Click Score: 6    End of Session   Activity Tolerance: Patient limited by lethargy Patient left: in bed;with bed alarm set   PT Visit Diagnosis: Other abnormalities of gait and mobility (R26.89);Other symptoms and signs involving the nervous system (R29.898)    Time: 1027-2536 PT Time Calculation (min) (ACUTE ONLY): 21 min   Charges:   PT Evaluation $PT Eval High Complexity: 1 High     PT G CodesMagda Hodges, [place] [phone] 11/06/2017   Jean Hodges 11/06/2017, 6:16 PM

## 2017-11-06 NOTE — Progress Notes (Signed)
Patient is not alert enough to take any medications at this time. MD aware.

## 2017-11-06 NOTE — Progress Notes (Signed)
Daily Progress Note   Patient Name: Jean Hodges       Date: 11/06/2017 DOB: 1933/11/22  Age: 84 y.o. MRN#: [id number] Attending Physician: Aline August, MD Primary Care Physician: Patient, No Pcp Per Admit Date: 11/03/2017  Reason for Consultation/Follow-up: Establishing goals of care  Subjective: Patient lethargic. Will nod head to two of my questions but otherwise lethargic and not following commands.   GOC:  Spoke with son, Richard via telephone. Again discussed hospital diagnoses and interventions. Educated on disease trajectory of dementia. He is surprised to hear she is still not eating and "if she's not eating, call me or my sister." Delfino Lovett is hopeful he can encourage his mother to eat via telephone.  Advanced directives, concepts specific to code status, and artifical feeding and hydration were discussed. Richard tells me she has a living will that states "do everything to keep her alive." He speaks of him and his sister trying to "avoid" the topic of a feeding tube and to do everything possible to encourage her to [place]. I did explain risks with placing feeding tubes in patient's with dementia.   Again, Delfino Lovett is most worried about her positive FOBT and "wants answers." I explained that I have asked Dr. Starla Link to call him to discuss his concerns. I also explained that her hemoglobin is stable today.   Delfino Lovett will be in town this weekend from [place]. We plan to meet on Sunday afternoon. I encouraged Richard to contact his sister to involve in goals of care conversation.   Length of Stay: 3  Current Medications: Scheduled Meds:  . carvedilol  12.5 mg Oral BID WC  . Chlorhexidine Gluconate Cloth  6 each Topical Q0600  . cholecalciferol  2,000 Units Oral Daily  . feeding  supplement (ENSURE ENLIVE)  237 mL Oral TID BM  . ferrous sulfate  325 mg Oral BID WC  . [START ON 11/07/2017] levothyroxine  50 mcg Intravenous Daily  . mirtazapine  15 mg Oral QHS  . mupirocin ointment  1 application Nasal BID  . pantoprazole  40 mg Oral Daily  . polyethylene glycol  17 g Oral Daily  . senna-docusate  2 tablet Oral BID  . sertraline  75 mg Oral Daily    Continuous Infusions: . cefTRIAXone (ROCEPHIN)  IV Stopped (11/05/17 1250)  . dextrose 5 %  and 0.45% NaCl 50 mL/hr at 11/06/17 1201  . levETIRAcetam    . potassium chloride      PRN Meds: ketorolac, ondansetron (ZOFRAN) IV  Physical Exam  Constitutional: She appears lethargic. She appears ill.  HENT:  Head: Normocephalic and atraumatic.  Pulmonary/Chest: No accessory muscle usage. No tachypnea. No respiratory distress.  Abdominal: Normal appearance. There is no tenderness.  Neurological: She appears lethargic.  Skin: Skin is warm and dry. There is pallor.  Psychiatric: Cognition and memory are impaired. She is noncommunicative. She is inattentive.  Nursing note and vitals reviewed.          Vital Signs: BP (!) 157/57 (BP Location: Left Arm)   Pulse 79   Temp 99.9 F (37.7 C)   Resp 16   Ht 5\' 1"  (1.549 m)   Wt 47.2 kg (104 lb 0.9 oz)   SpO2 95%   BMI 19.66 kg/m  SpO2: SpO2: 95 % O2 Device: O2 Device: Room Air O2 Flow Rate:    Intake/output summary:   Intake/Output Summary (Last 24 hours) at 11/06/2017 1455 Last data filed at 11/06/2017 0600 Gross per 24 hour  Intake 789.17 ml  Output -  Net 789.17 ml   LBM: Last BM Date: 11/03/17 Baseline Weight: Weight: 48.1 kg (106 lb) Most recent weight: Weight: 47.2 kg (104 lb 0.9 oz)  Palliative Assessment/Data: PPS 20%     Patient Active Problem List   Diagnosis Date Noted  . Palliative care by specialist   . Goals of care, counseling/discussion   . Altered mental status   . Pressure injury of skin 11/04/2017  . Hyperkalemia 11/04/2017  .  UTI (urinary tract infection) 11/03/2017  . Protein-calorie malnutrition, severe 10/10/2017  . Anemia 07/23/2017  . Symptomatic anemia 11/27/2016  . Tonic-clonic seizure disorder ([place]) 10/09/2016  . Oropharyngeal dysphagia 10/09/2016  . Moderate protein-calorie malnutrition ([place]) 10/09/2016  . Hypothyroidism (acquired) 10/09/2016  . Type 2 diabetes mellitus with peripheral vascular disease ([place]) 10/09/2016  . PVD (peripheral vascular disease) ([place]) 10/09/2016  . Essential hypertension 10/09/2016  . Alzheimer's dementia 10/09/2016  . Chronic depression 10/09/2016  . Hyperlipidemia 10/09/2016  . Coronary artery disease involving native coronary artery of native heart without angina pectoris 10/09/2016    Palliative Care Assessment & Plan   Patient Profile: 84 y.o. female  with past medical history of Alzheimer's dementia, seizures, anemia, systolic CHF, cardiomyopathy, pacemaker, CAD, HTN, HLD, DM, PVD admitted on 11/03/2017 with altered mental status and poor oral intake. In ED, patient with hyperkalemia, Hgb 8, and FOBT positive. Found to have UTI and receiving IV rocephin. Hgb dropped to 7.1 on 4/23 and patient receiving 2 units PRBC. Underlying dementia. Dietician following for protein calorie malnutrition. PT/SLP evaluations limited by patient fatigue and lethargy. Palliative medicine consultation for goals of care.   Assessment: Acute encephalopathy Alzheimer's dementia UTI Hyperkalemia Moderate protein-calorie malnutrition Anemia  Recommendations/Plan:  Continue FULL code/FULL scope. Son tells me the patient has a living will that states "do everything to keep her alive."   Son lives in [place] but will be in town this weekend. I plan to meet with Delfino Lovett in person Sunday, 4/28 afternoon to further discuss goals of care in person.   Goals of Care and Additional Recommendations:  Limitations on Scope of Treatment: Full Scope Treatment  Code Status: FULL   Code Status  Orders  (From admission, onward)        Start     Ordered   11/03/17 1648  Full code  Continuous     11/03/17 1648    Code Status History    Date Active Date Inactive Code Status Order ID Comments User Context   10/09/2017 2031 10/10/2017 1831 Full Code [id number]  Norval Morton, MD ED   07/23/2017 1509 07/24/2017 1809 Full Code [id number]  Phillips Grout, MD Inpatient   11/27/2016 2203 11/28/2016 2154 Full Code [id number]  Rise Patience, MD Inpatient       Prognosis:   Unable to determine guarded with declining functional/cognitive/nutritional status secondary to underlying Alzheimer's dementia and UTI.  Discharge Planning:  To Be Determined  Care plan was discussed with RN, Dr. Starla Link, son  Thank you for allowing the Palliative Medicine Team to assist in the care of this patient.   Time In: 1640 Time Out: 1725 Total Time 33min Prolonged Time Billed  no      Greater than 50%  of this time was spent counseling and coordinating care related to the above assessment and plan.  Ihor Dow, FNP-C Palliative Medicine Team  Phone: [phone] Fax: [phone]  Please contact Palliative Medicine Team phone at [phone] for questions and concerns.

## 2017-11-06 NOTE — Evaluation (Signed)
SLP Cancellation Note  Patient Details Name: Jean Hodges MRN: [id number] DOB: [birth year]   Cancelled treatment:       Reason Eval/Treat Not Completed: Fatigue/lethargy limiting ability to participate   [name] 11/06/2017, 8:21 AM

## 2017-11-06 NOTE — Care Management Important Message (Signed)
Important Message  Patient Details  Name: Jean Hodges MRN: [id number] Date of Birth: 12-04-1933   Medicare Important Message Given:  Yes    Kerin Salen 11/06/2017, 10:59 AMImportant Message  Patient Details  Name: Jean Hodges MRN: [id number] Date of Birth: 02/21/1934   Medicare Important Message Given:  Yes    Kerin Salen 11/06/2017, 10:59 AM

## 2017-11-07 ENCOUNTER — Inpatient Hospital Stay (HOSPITAL_COMMUNITY)
Admit: 2017-11-07 | Discharge: 2017-11-07 | Disposition: A | Discharge status: Home / Self Care | Payer: Medicare Other | Attending: Internal Medicine | Admitting: Internal Medicine

## 2017-11-07 ENCOUNTER — Inpatient Hospital Stay (HOSPITAL_COMMUNITY): Payer: Medicare Other

## 2017-11-07 ENCOUNTER — Encounter (HOSPITAL_COMMUNITY): Payer: Self-pay | Admitting: Radiology

## 2017-11-07 DIAGNOSIS — R0600 Dyspnea, unspecified: Secondary | ICD-10-CM

## 2017-11-07 DIAGNOSIS — R4182 Altered mental status, unspecified: Secondary | ICD-10-CM

## 2017-11-07 LAB — COMPREHENSIVE METABOLIC PANEL
ALBUMIN: 2.5 g/dL — AB (ref 3.5–5.0)
ALT: 32 U/L (ref 14–54)
AST: 37 U/L (ref 15–41)
Alkaline Phosphatase: 256 U/L — ABNORMAL HIGH (ref 38–126)
Anion gap: 9 (ref 5–15)
BUN: 15 mg/dL (ref 6–20)
CHLORIDE: 114 mmol/L — AB (ref 101–111)
CO2: 20 mmol/L — AB (ref 22–32)
Calcium: 8.4 mg/dL — ABNORMAL LOW (ref 8.9–10.3)
Creatinine, Ser: 0.46 mg/dL (ref 0.44–1.00)
GFR calc Af Amer: >60 mL/min (ref >60)
GFR calc non Af Amer: >60 mL/min (ref >60)
GLUCOSE: 120 mg/dL — AB (ref 65–99)
POTASSIUM: 3.6 mmol/L (ref 3.5–5.1)
Sodium: 143 mmol/L (ref 135–145)
Total Bilirubin: 1.3 mg/dL — ABNORMAL HIGH (ref 0.3–1.2)
Total Protein: 5.7 g/dL — ABNORMAL LOW (ref 6.5–8.1)

## 2017-11-07 LAB — CBC WITH DIFFERENTIAL/PLATELET
BASOS ABS: 0 10*3/uL (ref 0.0–0.1)
BASOS PCT: 0 %
EOS PCT: 1 %
Eosinophils Absolute: 0.1 10*3/uL (ref 0.0–0.7)
HCT: 36 % (ref 36.0–46.0)
Hemoglobin: 12 g/dL (ref 12.0–15.0)
Lymphocytes Relative: 23 %
Lymphs Abs: 2.1 10*3/uL (ref 0.7–4.0)
MCH: 32.3 pg (ref 26.0–34.0)
MCHC: 33.3 g/dL (ref 30.0–36.0)
MCV: 97 fL (ref 78.0–100.0)
MONO ABS: 0.5 10*3/uL (ref 0.1–1.0)
Monocytes Relative: 5 %
NEUTROS ABS: 6.3 10*3/uL (ref 1.7–7.7)
Neutrophils Relative %: 71 %
PLATELETS: 109 10*3/uL — AB (ref 150–400)
RBC: 3.71 MIL/uL — ABNORMAL LOW (ref 3.87–5.11)
RDW: 19.3 % — AB (ref 11.5–15.5)
WBC: 9.1 10*3/uL (ref 4.0–10.5)

## 2017-11-07 LAB — GLUCOSE, CAPILLARY
GLUCOSE-CAPILLARY: 119 mg/dL — AB (ref 65–99)
GLUCOSE-CAPILLARY: 102 mg/dL — AB (ref 65–99)
Glucose-Capillary: 93 mg/dL (ref 65–99)
Glucose-Capillary: 133 mg/dL — ABNORMAL HIGH (ref 65–99)
Glucose-Capillary: 106 mg/dL — ABNORMAL HIGH (ref 65–99)
Glucose-Capillary: 121 mg/dL — ABNORMAL HIGH (ref 65–99)

## 2017-11-07 LAB — MAGNESIUM: Magnesium: 1.4 mg/dL — ABNORMAL LOW (ref 1.7–2.4)

## 2017-11-07 LAB — ECHOCARDIOGRAM COMPLETE
Height: 61 in
Weight: 1763.68 oz

## 2017-11-07 MED ORDER — MAGNESIUM SULFATE 2 GM/50ML IV SOLN
2.0000 g | Freq: Once | INTRAVENOUS | Status: AC
  Administered 2017-11-07: 2 g via INTRAVENOUS
  Filled 2017-11-07: qty 50

## 2017-11-07 MED ORDER — FUROSEMIDE 10 MG/ML IJ SOLN
40.0000 mg | Freq: Every day | INTRAMUSCULAR | Status: DC
  Administered 2017-11-07 – 2017-11-14 (×7): 40 mg via INTRAVENOUS
  Filled 2017-11-07 (×7): qty 4

## 2017-11-07 NOTE — Progress Notes (Signed)
Palliative meeting with son on Sunday. Not ready for dc today.   LCSW will continue to follow for disposition.   Carolin Coy [place]

## 2017-11-07 NOTE — Procedures (Signed)
Date of Recording 11/07/2017  Referring physician Dr. Starla Link  Reason for the study Altered mental status  Technical Digital EEG recording using 10-20 electrode international system  Description of the recording Posterior dominant rhythm is 6-7 hertz symmetrical and reactive Briefly non-REM stage II sleep was obtained sleep spindles were seen. epileptiform features were not seen during this recording  Impression The EEG is abnormal and findings are suggestive of mild generalized cerebral dysfunction, epileptiform features were not seen during this recording

## 2017-11-07 NOTE — Clinical Social Work Note (Signed)
Clinical Social Work Assessment  Patient Details  Name: Jean Hodges MRN: [id number] Date of Birth: June 28, 1934  Date of referral:  11/07/17               Reason for consult:  Facility Placement                Permission sought to share information with:  Family Supports, Chartered certified accountant granted to share information::  No([place] not oriented)  Name::     Higher education careers adviser::  [place]  Relationship::  Son  Sport and exercise psychologist Information:     Housing/Transportation Living arrangements for the past 2 months:  [place] of Information:  Facility Patient Interpreter Needed:  None Criminal Activity/Legal Involvement Pertinent to Current Situation/Hospitalization:  No - Comment as needed Significant Relationships:  Adult Children, Warehouse manager Lives with:  Facility Resident Do you feel safe going back to the place where you live?  Yes Need for family participation in patient care:  Yes (Comment)  Care giving concerns:  No care giving concerns at time of assessment.    Social Worker assessment / plan:  LCSW following for facility placement.   Patient admitted for altered mental status.   Patient is not oriented. LCSW spoke with facility to complete assessment. LCSW left message for patients son Delfino Lovett for collateral.   Patient is from [place] for long term care. According facility patient has been a resident for about 2 years. Facility reports that patient is total care and needs assistance with all ADLs.   Patient has palliative consult. Per palliative not patient's son is requesting aggressive care. Palliative has a face to face meeting scheduled with patients son this weekend.   PLAN: Return to SNF at [facility] pending palliative meeting.   Employment status:  Retired Nurse, adult PT Recommendations:  Not assessed at this time Information / Referral to community resources:     Patient/Family's Response to care:  Unable  to access.  Patient/Family's Understanding of and Emotional Response to Diagnosis, Current Treatment, and Prognosis:  Unable to access. Facility is agreeable to patient's return to facility. Facility expressed concerns about patient's son refusing palliative at the facility.   Emotional Assessment Appearance:  Appears stated age Attitude/Demeanor/Rapport:    Affect (typically observed):    Orientation:  Oriented to Self Alcohol / Substance use:  Not Applicable Psych involvement (Current and /or in the community):  No (Comment)  Discharge Needs  Concerns to be addressed:  No discharge needs identified Readmission within the last 30 days:  Yes Current discharge risk:  None Barriers to Discharge:  Continued Medical Work up   [employer], LCSW 11/07/2017, 2:31 PM

## 2017-11-07 NOTE — Progress Notes (Signed)
  Echocardiogram 2D Echocardiogram has been performed.  Jean Hodges Jean Hodges 11/07/2017, 2:12 PM

## 2017-11-07 NOTE — Evaluation (Signed)
Clinical/Bedside Swallow Evaluation Patient Details  Name: Jean Hodges MRN: [id number] Date of Birth: 27-Aug-1933  Today's Date: 11/07/2017 Time: SLP Start Time (ACUTE ONLY): 81 SLP Stop Time (ACUTE ONLY): 1527 SLP Time Calculation (min) (ACUTE ONLY): 23 min  Past Medical History:  Past Medical History:  Diagnosis Date  . Acute encephalopathy   . Acute encephalopathy 09/30/2016  . Alzheimer's dementia without behavioral disturbance   . Alzheimer's dementia without behavioral disturbance 10/07/2016  . Anemia   . Anemia 01/28/2014  . Atherosclerotic PVD with ulceration ([place])   . CAD (coronary artery disease)   . CAD (coronary artery disease) 01/04/2014  . Cellulitis   . Cellulitis of left leg   . Diabetes mellitus without complication ([place])   . DM (diabetes mellitus) ([place]) 01/27/2014  . HTN (hypertension) 01/04/2014  . Hyperlipidemia   . Hypertension   . Hypoglycemia associated with type 2 diabetes mellitus ([place])   . Macrocytic anemia   . Seizures ([place]) 09/30/2016  . Tear of left rotator cuff   . UTI (urinary tract infection)   . UTI (urinary tract infection) 09/25/2016   Past Surgical History:  Past Surgical History:  Procedure Laterality Date  . PACEMAKER IMPLANT     HPI:  84 y.o. female with medical history significant for peripheral vascular disease, cardiomyopathy, chronic atrial fibrillation, chronic systolic heart failure, oropharyngeal dysphagia, who presented to [facility] ED from SNF with altered mental status.  History is obtained from her son, ED physician, and medical records due to the patient's confusion in the setting of advanced dementia. She has been more somnolent with poor oral intake CXR on 10/2617 indicated Pulmonary vascular congestion with mild pulmonary edema and pleural effusions. There may well be a degree of underlying congestive heart failure. There is consolidation in the left lower lobe concerning for pneumonia in this area. It should be noted that  alveolar edema may also be present in this area  Assessment / Plan / Recommendation Clinical Impression   Pt presents with cognitive-based oropharyngeal dysphagia paired with decreased LOA/mentation; oral phase symptoms included oral holding, prolonged oral transit and decreased lingual manipulation; pharyngeal phase components included delayed throat clearing with puree and immediate cough with larger volumes of thin liquids; this was eliminated by 1/2 tsp amounts of thin with mod verbal cues to swallow during PO intake; recommend a conservative diet of Dysphagia 1 (puree)/thin liquids via 1/2 tsp when pt alert/responsive for meals/snack consumption; ST will f/u for diet tolerance and objective testing prn; thank you for this consult. SLP Visit Diagnosis: Dysphagia, oropharyngeal phase (R13.12)    Aspiration Risk  Mild aspiration risk    Diet Recommendation   Dysphagia 1 (puree)/thin liquids via 1/2 tsp amounts  Medication Administration: Crushed with puree    Other  Recommendations Oral Care Recommendations: Oral care QID   Follow up Recommendations Skilled Nursing facility      Frequency and Duration min 2x/week  1 week       Prognosis Prognosis for Safe Diet Advancement: Fair Barriers to Reach Goals: Cognitive deficits;Severity of deficits      Swallow Study   General Date of Onset: 11/03/17 HPI: 84 y.o. female with medical history significant for peripheral vascular disease, cardiomyopathy, chronic atrial fibrillation, chronic systolic heart failure, oropharyngeal dysphagia, who presented to [facility] ED from SNF with altered mental status.  History is obtained from her son, ED physician, and medical records due to the patient's confusion in the setting of advanced dementia. She has been more somnolent with poor  oral intake Type of Study: Bedside Swallow Evaluation Previous Swallow Assessment: n/a Diet Prior to this Study: Dysphagia 1 (puree);Thin liquids Temperature Spikes Noted:  No Respiratory Status: Room air History of Recent Intubation: No Behavior/Cognition: Cooperative;Confused;Lethargic/Drowsy Oral Cavity Assessment: Dry Oral Care Completed by SLP: Other (Comment)(Yes; partial) Oral Cavity - Dentition: Missing dentition Self-Feeding Abilities: Total assist Patient Positioning: Upright in bed Baseline Vocal Quality: Breathy;Low vocal intensity Volitional Cough: Weak Volitional Swallow: Able to elicit(with encouragement)    Oral/Motor/Sensory Function Overall Oral Motor/Sensory Function: Generalized oral weakness   Ice Chips Ice chips: Impaired Presentation: Spoon Oral Phase Impairments: Reduced lingual movement/coordination Oral Phase Functional Implications: Prolonged oral transit;Oral holding Pharyngeal Phase Impairments: Suspected delayed Swallow   Thin Liquid Thin Liquid: Impaired Presentation: Cup;Spoon;Straw Oral Phase Impairments: Reduced lingual movement/coordination Oral Phase Functional Implications: Prolonged oral transit;Oral holding Pharyngeal  Phase Impairments: Suspected delayed Swallow;Cough - Immediate Other Comments: (with larger cup sips/straw)    Nectar Thick Nectar Thick Liquid: Not tested   Honey Thick Honey Thick Liquid: Not tested   Puree Puree: Impaired Presentation: Spoon Oral Phase Impairments: Reduced lingual movement/coordination Oral Phase Functional Implications: Prolonged oral transit;Oral holding Pharyngeal Phase Impairments: Suspected delayed Swallow;Throat Clearing - Delayed   Solid      Solid: Not tested        Elvina Sidle, M.S., CCC-SLP 11/07/2017,4:05 PM

## 2017-11-07 NOTE — Progress Notes (Signed)
Patient ID: Jean Hodges, female   DOB: 03/24/1934, 84 y.o.   MRN: [id number]  PROGRESS NOTE    Jean Hodges  TUU:[id number] DOB: May 14, 1934 DOA: 11/03/2017 PCP: Patient, No Pcp Per   Brief Narrative:  84 year old female with history of peripheral vascular disease, Alzheimer's dementia, chronic atrial fibrillation, chronic systolic heart failure, oropharyngeal dysphagia presented from SNF with altered mental status and poor oral intake.  She was started on intravenous antibiotics for probable UTI   Assessment & Plan:   Active Problems:   Moderate protein-calorie malnutrition ([place])   Type 2 diabetes mellitus with peripheral vascular disease (HCC)   Alzheimer's dementia   UTI (urinary tract infection)   Pressure injury of skin   Hyperkalemia   Palliative care by specialist   Goals of care, counseling/discussion   Altered mental status  Acute metabolic encephalopathy -In a patient with dementia.  Probably from UTI -Patient still very confused despite being on antibiotics.  CAT scan of the brain was negative for acute abnormality.  EEG is pending.  Will get MRI of the brain without contrast.  - Monitor mental status.  Fall precautions  Recurrent UTI -Continue Rocephin.  Urine cultures showed multiple organisms.  Repeat urine culture from 11/06/2017 is pending  Moderate protein calorie malnutrition -Follow nutrition recommendations  Alzheimer's dementia without behavioral disturbances -Monitor mental status.  -Overall prognosis is guarded to poor as patient's oral intake is very poor.  Palliative care following -Tried to call patient's son on phone on 11/06/2017 but it went to voicemail. -Palliative care is apparently meeting with the son on Sunday  Hyperkalemia -Resolved.    Repeat a.m. labs  Hypomagnesemia -Replace.  Repeat a.m. labs  Diabetes mellitus type 2 with peripheral vascular disease -Continue Accu-Cheks.  Blood sugars stable  Stage I pressure injury over her  coccyx -Continue skin care  Mild drop in hemoglobin in a patient with chronic anemia -Status post 2 units transfusion on 11/04/2017.  Hemoglobin is 12 today.  No overt signs of GI bleeding.   -Patient has had multiple admissions for acute on chronic anemia. -Patient was supposed to be followed up with outpatient hematology.  Patient had refused bone marrow biopsy in 2017 during outpatient [facility] hematology visit. -We will hold off on any further inpatient consultations including GI/hematology.  I do not think the patient is in a condition to undergo any GI procedures.  Consider outpatient evaluation if family is interested.  DVT prophylaxis: SCDs Code Status: Full Family Communication: None at bedside Disposition Plan: Depends on clinical outcome  Consultants: Palliative care  Procedures: None  Antimicrobials:  Rocephin from 11/03/2017 onwards   Subjective: Patient seen and examined at bedside.  No overnight fever or vomiting reported.  Patient still remains very drowsy and hardly has had any oral intake. Objective: Vitals:   11/06/17 0811 11/06/17 1457 11/06/17 2052 11/07/17 0428  BP:  (!) 146/58 (!) 163/66 (!) 174/83  Pulse: 79 79 78 98  Resp:  _0 Temp:  (!) 97.5 F (36.4 C) 97.9 F (36.6 C) 98.4 F (36.9 C)  TempSrc:  Axillary Oral Oral  SpO2: 95% 97% 96% 94%  Weight:    50 kg (110 lb 3.7 oz)  Height:        Intake/Output Summary (Last 24 hours) at 11/07/2017 1235 Last data filed at 11/06/2017 1511 Gross per 24 hour  Intake 458.33 ml  Output -  Net 458.33 ml   Filed Weights   11/04/17 0541 11/06/17 0500 11/07/17 0428  Weight: 49.7 kg (109 lb 8 oz) 47.2 kg (104 lb 0.9 oz) 50 kg (110 lb 3.7 oz)    Examination:  General exam: Very thinly built elderly female, sleepy, hardly wakes up on calling her name.  No acute distress  respiratory system: Bilateral decreased breath sound at bases with scattered crackles Cardiovascular system: S1-S2 heard, rate controlled   gastrointestinal system: Abdomen is nondistended, soft and nontender. Normal bowel sounds heard. Extremities: No cyanosis, clubbing, edema.  Right BKA    Data Reviewed: I have personally reviewed following labs and imaging studies  CBC: Recent Labs  Lab 11/03/17 1148 11/04/17 0440 11/04/17 0631 11/06/17 0544 11/07/17 0547  WBC 5.4 TEST REQUEST RECEIVED WITHOUT APPROPRIATE SPECIMEN 5.7 7.3 9.1  NEUTROABS  --   --   --  5.1 6.3  HGB 8.2* TEST REQUEST RECEIVED WITHOUT APPROPRIATE SPECIMEN 7.1* 11.0* 12.0  HCT 24.9* TEST REQUEST RECEIVED WITHOUT APPROPRIATE SPECIMEN 21.1* 33.1* 36.0  MCV 103.8* TEST REQUEST RECEIVED WITHOUT APPROPRIATE SPECIMEN 103.4* 96.8 97.0  PLT 138* TECH ERROR 104* 100* 314*   Basic Metabolic Panel: Recent Labs  Lab 11/03/17 1148 11/04/17 0631 11/06/17 0544 11/07/17 0547  NA 139 144 143 143  K 5.2* 3.2* 3.4* 3.6  CL 106 111 114* 114*  CO2 _0 20*  GLUCOSE 89 52* 137* 120*  BUN _1 CREATININE 0.46 0.46 0.51 0.46  CALCIUM 8.8* 8.3* 8.0* 8.4*  MG  --   --  1.3* 1.4*   GFR: Estimated Creatinine Clearance: 41.6 mL/min (by C-G formula based on SCr of 0.46 mg/dL). Liver Function Tests: Recent Labs  Lab 11/03/17 1148 11/04/17 0631 11/07/17 0547  AST 54* 40 37  ALT 39 34 32  ALKPHOS 367* 292* 256*  BILITOT 1.3* 1.2 1.3*  PROT 6.2* 5.3* 5.7*  ALBUMIN 2.7* 2.4* 2.5*   No results for input(s): LIPASE, AMYLASE in the last 168 hours. No results for input(s): AMMONIA in the last 168 hours. Coagulation Profile: No results for input(s): INR, PROTIME in the last 168 hours. Cardiac Enzymes: No results for input(s): CKTOTAL, CKMB, CKMBINDEX, TROPONINI in the last 168 hours. BNP (last 3 results) No results for input(s): PROBNP in the last 8760 hours. HbA1C: No results for input(s): HGBA1C in the last 72 hours. CBG: Recent Labs  Lab 11/06/17 2050 11/06/17 2336 11/07/17 0346 11/07/17 0737 11/07/17 1150  GLUCAP 82 85 93 133* 106*    Lipid Profile: No results for input(s): CHOL, HDL, LDLCALC, TRIG, CHOLHDL, LDLDIRECT in the last 72 hours. Thyroid Function Tests: No results for input(s): TSH, T4TOTAL, FREET4, T3FREE, THYROIDAB in the last 72 hours. Anemia Panel: No results for input(s): VITAMINB12, FOLATE, FERRITIN, TIBC, IRON, RETICCTPCT in the last 72 hours. Sepsis Labs: Recent Labs  Lab 11/03/17 1253  LATICACIDVEN 0.66    Recent Results (from the past 240 hour(s))  Urine culture     Status: Abnormal   Collection Time: 11/03/17 12:18 PM  Result Value Ref Range Status   Specimen Description   Final    URINE, CATHETERIZED Performed at [facility] [address] [place], [place] [address]    Special Requests   Final    NONE Performed at [facility] [place] [address], [place], [place] [address]    Culture MULTIPLE ORGANISMS PRESENT, NONE PREDOMINANT (A)  Final   Report Status 11/04/2017 FINAL  Final         Radiology Studies: Ct Head Wo Contrast  Result Date: 11/06/2017  CLINICAL DATA:  History of Alzheimer's dementia. Dysphagia. Altered mental status. EXAM: CT HEAD WITHOUT CONTRAST TECHNIQUE: Contiguous axial images were obtained from the base of the skull through the vertex without intravenous contrast. COMPARISON:  03/07/2015 FINDINGS: Brain: Generalized atrophy. Chronic small-vessel ischemic changes of the hemispheric white matter. 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 No sign of mass lesion, hemorrhage, hydrocephalus or extra-axial collection. Vascular: There is atherosclerotic calcification of the major vessels at the base of the brain. Skull: Negative Sinuses/Orbits: Clear/normal Other: None IMPRESSION: No acute finding by CT. Atrophy and chronic small-vessel ischemic changes. 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 Electronically Signed   By: Nelson Chimes M.D.   On: 11/06/2017 14:49        Scheduled Meds: . carvedilol  12.5 mg  Oral BID WC  . Chlorhexidine Gluconate Cloth  6 each Topical Q0600  . cholecalciferol  2,000 Units Oral Daily  . feeding supplement (ENSURE ENLIVE)  237 mL Oral TID BM  . ferrous sulfate  325 mg Oral BID WC  . levothyroxine  50 mcg Intravenous Daily  . mirtazapine  15 mg Oral QHS  . mupirocin ointment  1 application Nasal BID  . pantoprazole  40 mg Oral Daily  . polyethylene glycol  17 g Oral Daily  . senna-docusate  2 tablet Oral BID  . sertraline  75 mg Oral Daily   Continuous Infusions: . cefTRIAXone (ROCEPHIN)  IV Stopped (11/06/17 1505)  . dextrose 5 % and 0.45% NaCl 50 mL/hr at 11/07/17 0828  . levETIRAcetam 500 mg (11/07/17 0100)     LOS: 4 days        Aline August, MD [facility] Pager [phone]  If 7PM-7AM, please contact night-coverage [email or web address] Password [facility] 11/07/2017, 12:35 PM

## 2017-11-07 NOTE — Progress Notes (Signed)
Offfsite EEG completed at [facility].  Results pending.

## 2017-11-08 DIAGNOSIS — I5023 Acute on chronic systolic (congestive) heart failure: Secondary | ICD-10-CM

## 2017-11-08 LAB — GLUCOSE, CAPILLARY
Glucose-Capillary: 95 mg/dL (ref 65–99)
Glucose-Capillary: 98 mg/dL (ref 65–99)
Glucose-Capillary: 139 mg/dL — ABNORMAL HIGH (ref 65–99)
Glucose-Capillary: 165 mg/dL — ABNORMAL HIGH (ref 65–99)

## 2017-11-08 LAB — URINE CULTURE

## 2017-11-08 NOTE — Progress Notes (Signed)
Patient ID: Jean Hodges, female   DOB: August 30, 1933, 84 y.o.   MRN: [id number]  PROGRESS NOTE    Jean Hodges  NWG:[id number] DOB: [birth year] DOA: 11/03/2017 PCP: Patient, No Pcp Per   Brief Narrative:  84 year old female with history of peripheral vascular disease, Alzheimer's dementia, chronic atrial fibrillation, chronic systolic heart failure, oropharyngeal dysphagia presented from SNF with altered mental status and poor oral intake.  She was started on intravenous antibiotics for probable UTI   Assessment & Plan:   Active Problems:   Moderate protein-calorie malnutrition ([place])   Type 2 diabetes mellitus with peripheral vascular disease (HCC)   Alzheimer's dementia   UTI (urinary tract infection)   Pressure injury of skin   Hyperkalemia   Palliative care by specialist   Goals of care, counseling/discussion   Altered mental status  Acute metabolic encephalopathy -In a patient with dementia.  Probably from UTI -Patient still very confused despite being on antibiotics.  CAT scan of the brain was negative for acute abnormality.  EEG was negative for epileptiform activity.  MRI of the brain could not be done because of pacemaker - Monitor mental status.  Fall precautions -Patient is slightly more awake today but hardly answers any questions -Diet as per SLP recommendations  Recurrent UTI -Currently on Rocephin.  Urine cultures showed multiple organisms.  Repeat urine culture from 11/06/2017 showed insignificant growth.  Discontinue antibiotics and monitor  Acute on chronic systolic heart failure -Chest x-ray from 11/07/2017 showed signs of fluid overload. -Lasix was started.  Will continue.  Monitor input and output.  Daily weights. -2D echo showed EF of 20 to 25% with moderate- severe mitral and tricuspid regurgitation. -Outpatient follow-up with cardiology  Moderate protein calorie malnutrition -Follow nutrition recommendations  Alzheimer's dementia without behavioral  disturbances -Monitor mental status.  -Overall prognosis is guarded to poor as patient's oral intake is very poor.  Palliative care following -Tried to call patient's son on phone on 11/06/2017 but it went to voicemail. -Palliative care is apparently meeting with the son on Sunday -Consider hospice  Hyperkalemia -No labs today.  Repeat a.m. labs  Hypomagnesemia -No labs today.  Repeat a.m. Labs  Diabetes mellitus type 2 with peripheral vascular disease -Continue Accu-Cheks.  Blood sugars stable  Stage I pressure injury over her coccyx -Continue skin care  Mild drop in hemoglobin in a patient with chronic anemia -Status post 2 units transfusion on 11/04/2017.  No labs today.  Repeat a.m. labs.  No overt signs of GI bleeding.   -Patient has had multiple admissions for acute on chronic anemia. -Patient was supposed to be followed up with outpatient hematology.  Patient had refused bone marrow biopsy in 2017 during outpatient [facility] hematology visit. -We will hold off on any further inpatient consultations including GI/hematology.  I do not think the patient is in a condition to undergo any GI procedures.  Consider outpatient evaluation if family is interested.  DVT prophylaxis: SCDs Code Status: Full Family Communication: None at bedside Disposition Plan: Depends on clinical outcome  Consultants: Palliative care  Procedures: None  Antimicrobials:  Rocephin from 11/03/2017 onwards   Subjective: Patient seen and examined at bedside.  Patient is more awake this morning but hardly answers any questions.  No blood fever or vomiting. Objective: Vitals:   11/07/17 2031 11/08/17 0422 11/08/17 0424 11/08/17 0745  BP: (!) 163/72 (!) 169/70  (!) 169/73  Pulse: 78 79  79  Resp: _0 Temp: 97.6 F (36.4 C) 98.2 F (  36.8 C)  98.3 F (36.8 C)  TempSrc: Oral Oral  Oral  SpO2: 99% 97%  98%  Weight:   49.4 kg (108 lb 14.5 oz)   Height:        Intake/Output Summary (Last 24 hours) at  11/08/2017 1113 Last data filed at 11/08/2017 0601 Gross per 24 hour  Intake 2361.66 ml  Output 750 ml  Net 1611.66 ml   Filed Weights   11/06/17 0500 11/07/17 0428 11/08/17 0424  Weight: 47.2 kg (104 lb 0.9 oz) 50 kg (110 lb 3.7 oz) 49.4 kg (108 lb 14.5 oz)    Examination:  General exam: Very thinly built elderly female, more awake today but hardly answers any questions  respiratory system: Bilateral decreased breath sounds at bases with basilar crackles Cardiovascular system: S1-S2 heard, rate controlled  gastrointestinal system: Abdomen is nondistended, soft and nontender. Normal bowel sounds heard. Extremities: No cyanosis, clubbing, edema.  Right BKA    Data Reviewed: I have personally reviewed following labs and imaging studies  CBC: Recent Labs  Lab 11/03/17 1148 11/04/17 0440 11/04/17 0631 11/06/17 0544 11/07/17 0547  WBC 5.4 TEST REQUEST RECEIVED WITHOUT APPROPRIATE SPECIMEN 5.7 7.3 9.1  NEUTROABS  --   --   --  5.1 6.3  HGB 8.2* TEST REQUEST RECEIVED WITHOUT APPROPRIATE SPECIMEN 7.1* 11.0* 12.0  HCT 24.9* TEST REQUEST RECEIVED WITHOUT APPROPRIATE SPECIMEN 21.1* 33.1* 36.0  MCV 103.8* TEST REQUEST RECEIVED WITHOUT APPROPRIATE SPECIMEN 103.4* 96.8 97.0  PLT 138* TECH ERROR 104* 100* 561*   Basic Metabolic Panel: Recent Labs  Lab 11/03/17 1148 11/04/17 0631 11/06/17 0544 11/07/17 0547  NA 139 144 143 143  K 5.2* 3.2* 3.4* 3.6  CL 106 111 114* 114*  CO2 _0 20*  GLUCOSE 89 52* 137* 120*  BUN _1 CREATININE 0.46 0.46 0.51 0.46  CALCIUM 8.8* 8.3* 8.0* 8.4*  MG  --   --  1.3* 1.4*   GFR: Estimated Creatinine Clearance: 41.6 mL/min (by C-G formula based on SCr of 0.46 mg/dL). Liver Function Tests: Recent Labs  Lab 11/03/17 1148 11/04/17 0631 11/07/17 0547  AST 54* 40 37  ALT 39 34 32  ALKPHOS 367* 292* 256*  BILITOT 1.3* 1.2 1.3*  PROT 6.2* 5.3* 5.7*  ALBUMIN 2.7* 2.4* 2.5*   No results for input(s): LIPASE, AMYLASE in the last 168  hours. No results for input(s): AMMONIA in the last 168 hours. Coagulation Profile: No results for input(s): INR, PROTIME in the last 168 hours. Cardiac Enzymes: No results for input(s): CKTOTAL, CKMB, CKMBINDEX, TROPONINI in the last 168 hours. BNP (last 3 results) No results for input(s): PROBNP in the last 8760 hours. HbA1C: No results for input(s): HGBA1C in the last 72 hours. CBG: Recent Labs  Lab 11/07/17 1617 11/07/17 2005 11/07/17 2328 11/08/17 0344 11/08/17 0741  GLUCAP 121* 119* 102* 95 98   Lipid Profile: No results for input(s): CHOL, HDL, LDLCALC, TRIG, CHOLHDL, LDLDIRECT in the last 72 hours. Thyroid Function Tests: No results for input(s): TSH, T4TOTAL, FREET4, T3FREE, THYROIDAB in the last 72 hours. Anemia Panel: No results for input(s): VITAMINB12, FOLATE, FERRITIN, TIBC, IRON, RETICCTPCT in the last 72 hours. Sepsis Labs: Recent Labs  Lab 11/03/17 1253  LATICACIDVEN 0.66    Recent Results (from the past 240 hour(s))  Urine culture     Status: Abnormal   Collection Time: 11/03/17 12:18 PM  Result Value Ref Range Status   Specimen Description   Final  URINE, CATHETERIZED Performed at [facility], [place] [address], [place], [place] [address]    Special Requests   Final    NONE Performed at [facility], [place] [address] [place], [place] [address]    Culture MULTIPLE ORGANISMS PRESENT, NONE PREDOMINANT (A)  Final   Report Status 11/04/2017 FINAL  Final  Culture, Urine     Status: Abnormal   Collection Time: 11/06/17  9:38 PM  Result Value Ref Range Status   Specimen Description   Final    URINE, CLEAN CATCH Performed at [facility], [place] [address], [place], [place] [address]    Special Requests   Final    NONE Performed at [facility], [place] [address] [place], [place] [address]    Culture (A)  Final    <10,000 COLONIES/mL INSIGNIFICANT GROWTH Performed at [facility] [address] [place], [place] [address]    Report Status 11/08/2017 FINAL  Final         Radiology Studies: Ct Head Wo Contrast  Result Date: 11/06/2017 CLINICAL DATA:  History of Alzheimer's dementia. Dysphagia. Altered mental status. EXAM: CT HEAD WITHOUT CONTRAST TECHNIQUE: Contiguous axial images were obtained from the base of the skull through the vertex without intravenous contrast. COMPARISON:  03/07/2015 FINDINGS: Brain: Generalized atrophy. Chronic small-vessel ischemic changes of the hemispheric white matter. 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 No sign of mass lesion, hemorrhage, hydrocephalus or extra-axial collection. Vascular: There is atherosclerotic calcification of the major vessels at the base of the brain. Skull: Negative Sinuses/Orbits: Clear/normal Other: None IMPRESSION: No acute finding by CT. Atrophy and chronic small-vessel ischemic changes. 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 Electronically Signed   By: Nelson Chimes M.D.   On: 11/06/2017 14:49   Dg Chest Port 1 View  Result Date: 11/07/2017 CLINICAL DATA:  Shortness of breath EXAM: PORTABLE CHEST 1 VIEW COMPARISON:  July 23, 2017 FINDINGS: There is stable cardiomegaly with pacemaker leads attached to the right atrium, right ventricle, and coronary sinus. The pulmonary vascularity with mild pulmonary venous hypertension. There are bilateral pleural effusions. There is consolidation in the left lower lobe. There is slight underlying interstitial edema. There is aortic atherosclerosis. No evident adenopathy. There is degenerative change in each shoulder with superior migration of each humeral head. IMPRESSION: Pulmonary vascular congestion with mild pulmonary edema and pleural effusions. There may well be a degree of underlying congestive heart failure. There is consolidation in the left lower lobe concerning for pneumonia in this area. It should be noted that alveolar edema may  also be present in this area. Stable pacemaker lead positions. No adenopathy evident. There is aortic atherosclerosis. Aortic Atherosclerosis (ICD10-I70.0). Electronically Signed   By: Lowella Grip III M.D.   On: 11/07/2017 13:02        Scheduled Meds: . carvedilol  12.5 mg Oral BID WC  . Chlorhexidine Gluconate Cloth  6 each Topical Q0600  . cholecalciferol  2,000 Units Oral Daily  . feeding supplement (ENSURE ENLIVE)  237 mL Oral TID BM  . ferrous sulfate  325 mg Oral BID WC  . furosemide  40 mg Intravenous Daily  . levothyroxine  50 mcg Intravenous Daily  . mirtazapine  15 mg Oral QHS  . mupirocin ointment  1 application Nasal BID  . pantoprazole  40 mg Oral Daily  . polyethylene glycol  17 g Oral Daily  . senna-docusate  2 tablet Oral BID  . sertraline  75 mg Oral Daily   Continuous Infusions: . cefTRIAXone (ROCEPHIN)  IV Stopped (11/07/17 1330)  . dextrose 5 % and 0.45% NaCl 50 mL/hr at 11/08/17 0625  . levETIRAcetam Stopped (11/08/17 0104)     LOS: 5 days        Aline August, MD [facility] Pager [phone]  If 7PM-7AM, please contact night-coverage [email or web address] Password [email or web address] 11/08/2017, 11:13 AM

## 2017-11-09 ENCOUNTER — Inpatient Hospital Stay (HOSPITAL_COMMUNITY): Payer: Medicare Other

## 2017-11-09 LAB — BASIC METABOLIC PANEL
Anion gap: 8 (ref 5–15)
BUN: 13 mg/dL (ref 6–20)
CO2: 25 mmol/L (ref 22–32)
Calcium: 8.5 mg/dL — ABNORMAL LOW (ref 8.9–10.3)
Chloride: 110 mmol/L (ref 101–111)
Creatinine, Ser: 0.53 mg/dL (ref 0.44–1.00)
GFR calc Af Amer: >60 mL/min (ref >60)
Glucose, Bld: 129 mg/dL — ABNORMAL HIGH (ref 65–99)
POTASSIUM: 2.8 mmol/L — AB (ref 3.5–5.1)
Sodium: 143 mmol/L (ref 135–145)

## 2017-11-09 LAB — CBC WITH DIFFERENTIAL/PLATELET
BASOS ABS: 0 10*3/uL (ref 0.0–0.1)
Basophils Relative: 0 %
EOS PCT: 2 %
Eosinophils Absolute: 0.2 10*3/uL (ref 0.0–0.7)
HCT: 36.6 % (ref 36.0–46.0)
Hemoglobin: 11.9 g/dL — ABNORMAL LOW (ref 12.0–15.0)
LYMPHS PCT: 36 %
Lymphs Abs: 3.4 10*3/uL (ref 0.7–4.0)
MCH: 31.7 pg (ref 26.0–34.0)
MCHC: 32.5 g/dL (ref 30.0–36.0)
MCV: 97.6 fL (ref 78.0–100.0)
Monocytes Absolute: 0.4 10*3/uL (ref 0.1–1.0)
Monocytes Relative: 4 %
NEUTROS PCT: 58 %
Neutro Abs: 5.4 10*3/uL (ref 1.7–7.7)
PLATELETS: 97 10*3/uL — AB (ref 150–400)
RBC: 3.75 MIL/uL — AB (ref 3.87–5.11)
RDW: 18.4 % — ABNORMAL HIGH (ref 11.5–15.5)
WBC: 9.4 10*3/uL (ref 4.0–10.5)

## 2017-11-09 LAB — MAGNESIUM: MAGNESIUM: 1.3 mg/dL — AB (ref 1.7–2.4)

## 2017-11-09 LAB — GLUCOSE, CAPILLARY
GLUCOSE-CAPILLARY: 103 mg/dL — AB (ref 65–99)
Glucose-Capillary: 104 mg/dL — ABNORMAL HIGH (ref 65–99)
Glucose-Capillary: 119 mg/dL — ABNORMAL HIGH (ref 65–99)
Glucose-Capillary: 122 mg/dL — ABNORMAL HIGH (ref 65–99)
Glucose-Capillary: 125 mg/dL — ABNORMAL HIGH (ref 65–99)
Glucose-Capillary: 130 mg/dL — ABNORMAL HIGH (ref 65–99)
Glucose-Capillary: 96 mg/dL (ref 65–99)

## 2017-11-09 MED ORDER — LEVETIRACETAM 500 MG PO TABS: 500.0000 mg | ORAL_TABLET | Freq: Two times a day (BID) | ORAL | Status: DC

## 2017-11-09 MED ORDER — POTASSIUM CHLORIDE CRYS ER 10 MEQ PO TBCR
40.0000 meq | EXTENDED_RELEASE_TABLET | ORAL | Status: AC
  Filled 2017-11-09: qty 4

## 2017-11-09 MED ORDER — LEVOTHYROXINE SODIUM 100 MCG PO TABS
100.0000 ug | ORAL_TABLET | Freq: Every day | ORAL | Status: DC
  Administered 2017-11-11 – 2017-11-14 (×3): 100 ug via ORAL
  Filled 2017-11-09 (×3): qty 1

## 2017-11-09 MED ORDER — NYSTATIN 100000 UNIT/ML MT SUSP
5.0000 mL | Freq: Four times a day (QID) | OROMUCOSAL | Status: DC
  Administered 2017-11-10 – 2017-11-14 (×3): 500000 [IU] via ORAL
  Filled 2017-11-09 (×3): qty 5

## 2017-11-09 MED ORDER — FLUCONAZOLE 100MG IVPB
100.0000 mg | INTRAVENOUS | Status: DC
  Administered 2017-11-09: 100 mg via INTRAVENOUS
  Filled 2017-11-09 (×3): qty 50

## 2017-11-09 MED ORDER — MAGNESIUM SULFATE 2 GM/50ML IV SOLN
2.0000 g | Freq: Once | INTRAVENOUS | Status: AC
  Administered 2017-11-09: 2 g via INTRAVENOUS

## 2017-11-09 NOTE — Progress Notes (Addendum)
Patient ID: Jean Hodges, female   DOB: 07/06/1934, 84 y.o.   MRN: [id number]  PROGRESS NOTE    Jean Hodges  BZJ:[id number] DOB: 10/16/1933 DOA: 11/03/2017 PCP: Patient, No Pcp Per   Brief Narrative:  84 year old female with history of peripheral vascular disease, Alzheimer's dementia, chronic atrial fibrillation, chronic systolic heart failure, oropharyngeal dysphagia presented from SNF with altered mental status and poor oral intake.  She was started on intravenous antibiotics for probable UTI   Assessment & Plan:   Active Problems:   Moderate protein-calorie malnutrition ([place])   Type 2 diabetes mellitus with peripheral vascular disease (HCC)   Alzheimer's dementia   UTI (urinary tract infection)   Pressure injury of skin   Hyperkalemia   Palliative care by specialist   Goals of care, counseling/discussion   Altered mental status  Acute metabolic encephalopathy -In a patient with dementia.  Probably from UTI -Patient is awake but confused.  May be this is her baseline. CAT scan of the brain was negative for acute abnormality.  EEG was negative for epileptiform activity.  MRI of the brain could not be done because of pacemaker - Monitor mental status.  Fall precautions -Diet as per SLP recommendations  Recurrent UTI -Treated with IV Rocephin which was discontinued on 11/08/2017.  Urine cultures showed multiple organisms.  Repeat urine culture from 11/06/2017 showed insignificant growth.    Acute on chronic systolic heart failure -Chest x-ray from 11/07/2017 showed signs of fluid overload. -Lasix was started.  Will continue.  Monitor input and output.  Daily weights. -Repeat chest x-ray today -2D echo showed EF of 20 to 25% with moderate- severe mitral and tricuspid regurgitation. -Outpatient follow-up with cardiology  Hypokalemia -Replace.  Repeat a.m. labs  Hypomagnesemia -Replace.  Repeat a.m. labs  Moderate protein calorie malnutrition -Follow nutrition  recommendations  Alzheimer's dementia without behavioral disturbances -Monitor mental status.  -Overall prognosis is guarded to poor as patient's oral intake is very poor.  Palliative care following -Tried to call patient's son on phone on 11/06/2017 but it went to voicemail. -Palliative care is apparently meeting with the son today. -Consider hospice  Diabetes mellitus type 2 with peripheral vascular disease -Continue Accu-Cheks.  Blood sugars stable  Stage I pressure injury over her coccyx -Continue skin care  Mild drop in hemoglobin in a patient with chronic anemia -Status post 2 units transfusion on 11/04/2017.  Hemoglobin is 11.9 today.  Repeat a.m. labs.  No overt signs of GI bleeding.   -Patient has had multiple admissions for acute on chronic anemia. -Patient was supposed to be followed up with outpatient hematology.  Patient had refused bone marrow biopsy in 2017 during outpatient [facility] hematology visit. -We will hold off on any further inpatient consultations including GI/hematology.  I do not think the patient is in a condition to undergo any GI procedures.  Consider outpatient evaluation if family is interested.  DVT prophylaxis: SCDs Code Status: Full Family Communication: Spoke to son, Jean Hodges on phone  Disposition Plan: Probable discharge back to rehab facility in the next 1 or 2 days depending on discussion of palliative care with family  Consultants: Palliative care  Procedures: None  Antimicrobials:  Rocephin from 11/03/2017 onwards   Subjective: Patient seen and examined at bedside.  Patient is awake but hardly answers any questions.  No overnight fever, nausea or vomiting.   Objective: Vitals:   11/08/17 2017 11/09/17 0443 11/09/17 0444 11/09/17 0453  BP: (!) 160/84   (!) 160/80  Pulse: 81  90   Resp: 20  20   Temp: 98.7 F (37.1 C)  98.4 F (36.9 C)   TempSrc: Oral  Axillary   SpO2: 97%  93%   Weight:  55.2 kg (121 lb 11.1 oz)    Height:         Intake/Output Summary (Last 24 hours) at 11/09/2017 1050 Last data filed at 11/09/2017 0900 Gross per 24 hour  Intake 220 ml  Output 1200 ml  Net -980 ml   Filed Weights   11/07/17 0428 11/08/17 0424 11/09/17 0443  Weight: 50 kg (110 lb 3.7 oz) 49.4 kg (108 lb 14.5 oz) 55.2 kg (121 lb 11.1 oz)    Examination:  General exam: Very thinly built elderly female, awake but hardly answers any questions.  No acute distress  respiratory system: Bilateral decreased breath sounds at bases with scattered basilar crackles Cardiovascular system: S1-S2 heard, rate controlled  gastrointestinal system: Abdomen is nondistended, soft and nontender. Normal bowel sounds heard. Extremities: No cyanosis, clubbing, edema.  Right BKA    Data Reviewed: I have personally reviewed following labs and imaging studies  CBC: Recent Labs  Lab 11/04/17 0440 11/04/17 0631 11/06/17 0544 11/07/17 0547 11/09/17 0601  WBC TEST REQUEST RECEIVED WITHOUT APPROPRIATE SPECIMEN 5.7 7.3 9.1 9.4  NEUTROABS  --   --  5.1 6.3 5.4  HGB TEST REQUEST RECEIVED WITHOUT APPROPRIATE SPECIMEN 7.1* 11.0* 12.0 11.9*  HCT TEST REQUEST RECEIVED WITHOUT APPROPRIATE SPECIMEN 21.1* 33.1* 36.0 36.6  MCV TEST REQUEST RECEIVED WITHOUT APPROPRIATE SPECIMEN 103.4* 96.8 97.0 97.6  PLT TECH ERROR 104* 100* 109* 97*   Basic Metabolic Panel: Recent Labs  Lab 11/03/17 1148 11/04/17 0631 11/06/17 0544 11/07/17 0547 11/09/17 0601  NA 139 144 143 143 143  K 5.2* 3.2* 3.4* 3.6 2.8*  CL 106 111 114* 114* 110  CO2 _0 20* 25  GLUCOSE 89 52* 137* 120* 129*  BUN _1 CREATININE 0.46 0.46 0.51 0.46 0.53  CALCIUM 8.8* 8.3* 8.0* 8.4* 8.5*  MG  --   --  1.3* 1.4* 1.3*   GFR: Estimated Creatinine Clearance: 41.6 mL/min (by C-G formula based on SCr of 0.53 mg/dL). Liver Function Tests: Recent Labs  Lab 11/03/17 1148 11/04/17 0631 11/07/17 0547  AST 54* 40 37  ALT 39 34 32  ALKPHOS 367* 292* 256*  BILITOT 1.3* 1.2  1.3*  PROT 6.2* 5.3* 5.7*  ALBUMIN 2.7* 2.4* 2.5*   No results for input(s): LIPASE, AMYLASE in the last 168 hours. No results for input(s): AMMONIA in the last 168 hours. Coagulation Profile: No results for input(s): INR, PROTIME in the last 168 hours. Cardiac Enzymes: No results for input(s): CKTOTAL, CKMB, CKMBINDEX, TROPONINI in the last 168 hours. BNP (last 3 results) No results for input(s): PROBNP in the last 8760 hours. HbA1C: No results for input(s): HGBA1C in the last 72 hours. CBG: Recent Labs  Lab 11/08/17 1157 11/08/17 1533 11/09/17 0009 11/09/17 0431 11/09/17 0749  GLUCAP 139* 165* 125* 130* 122*   Lipid Profile: No results for input(s): CHOL, HDL, LDLCALC, TRIG, CHOLHDL, LDLDIRECT in the last 72 hours. Thyroid Function Tests: No results for input(s): TSH, T4TOTAL, FREET4, T3FREE, THYROIDAB in the last 72 hours. Anemia Panel: No results for input(s): VITAMINB12, FOLATE, FERRITIN, TIBC, IRON, RETICCTPCT in the last 72 hours. Sepsis Labs: Recent Labs  Lab 11/03/17 1253  LATICACIDVEN 0.66    Recent Results (from the past 240 hour(s))  Urine culture  Status: Abnormal   Collection Time: 11/03/17 12:18 PM  Result Value Ref Range Status   Specimen Description   Final    URINE, CATHETERIZED Performed at [facility], [place] [address] [place], [place] [address]    Special Requests   Final    NONE Performed at [facility], [place] [address] [place], [place] [address]    Culture MULTIPLE ORGANISMS PRESENT, NONE PREDOMINANT (A)  Final   Report Status 11/04/2017 FINAL  Final  Culture, Urine     Status: Abnormal   Collection Time: 11/06/17  9:38 PM  Result Value Ref Range Status   Specimen Description   Final    URINE, CLEAN CATCH Performed at [facility], [place] [address], [place], [place] [address]    Special Requests   Final    NONE Performed at [facility], [place] [address] [place], [place] [address]    Culture (A)  Final    <10,000 COLONIES/mL INSIGNIFICANT GROWTH Performed at [place] [address], [place], [place] [address]    Report Status 11/08/2017 FINAL  Final         Radiology Studies: Dg Chest Port 1 View  Result Date: 11/07/2017 CLINICAL DATA:  Shortness of breath EXAM: PORTABLE CHEST 1 VIEW COMPARISON:  July 23, 2017 FINDINGS: There is stable cardiomegaly with pacemaker leads attached to the right atrium, right ventricle, and coronary sinus. The pulmonary vascularity with mild pulmonary venous hypertension. There are bilateral pleural effusions. There is consolidation in the left lower lobe. There is slight underlying interstitial edema. There is aortic atherosclerosis. No evident adenopathy. There is degenerative change in each shoulder with superior migration of each humeral head. IMPRESSION: Pulmonary vascular congestion with mild pulmonary edema and pleural effusions. There may well be a degree of underlying congestive heart failure. There is consolidation in the left lower lobe concerning for pneumonia in this area. It should be noted that alveolar edema may also be present in this area. Stable pacemaker lead positions. No adenopathy evident. There is aortic atherosclerosis. Aortic Atherosclerosis (ICD10-I70.0). Electronically Signed   By: Lowella Grip III M.D.   On: 11/07/2017 13:02        Scheduled Meds: . carvedilol  12.5 mg Oral BID WC  . cholecalciferol  2,000 Units Oral Daily  . feeding supplement (ENSURE ENLIVE)  237 mL Oral TID BM  . ferrous sulfate  325 mg Oral BID WC  . furosemide  40 mg Intravenous Daily  . levothyroxine  50 mcg Intravenous Daily  . mirtazapine  15 mg Oral QHS  . pantoprazole  40 mg Oral Daily  . polyethylene glycol  17 g Oral Daily  . potassium chloride  40 mEq Oral Q4H  . senna-docusate  2 tablet Oral BID  . sertraline  75 mg Oral Daily   Continuous Infusions: . levETIRAcetam Stopped  (11/09/17 0100)  . magnesium sulfate 1 - 4 g bolus IVPB       LOS: 6 days        Aline August, MD [facility] Pager [phone]  If 7PM-7AM, please contact night-coverage [email or web address] Password [email or web address] 11/09/2017, 10:50 AM

## 2017-11-09 NOTE — Progress Notes (Signed)
F/u palliative goals of care meeting with son, daughter-in-law, and also daughter via telephone. After long discussion, continue FULL code/FULL scope. Family interested in time trial of feeding tube if necessary.  Full note to follow.  NO CHARGE  Ihor Dow, FNP-C Palliative Medicine Team  Phone: [phone] Fax: [phone]

## 2017-11-09 NOTE — Progress Notes (Signed)
Daily Progress Note   Patient Name: Jean Hodges       Date: 11/09/17 DOB: 27-Aug-1933  Age: 84 y.o. MRN#: [id number] Attending Physician: Aline August, MD Primary Care Physician: Patient, No Pcp Per Admit Date: 11/03/2017  Reason for Consultation/Follow-up: Establishing goals of care  Subjective: Patient wakes to voice but drowsy. She will nod yes/no to questions. I asked if she wanted food or drink and she nods her head 'no.' Denies pain. Family at bedside encouraging her to eat.   GOC:  I met with son Delfino Lovett) and DIL Hassell Done) in family conference room. Daughter (Cyndi) on speaker phone.  Again introduced Palliative Medicine as specialized medical care for people living with serious illness. It focuses on providing relief from the symptoms and stress of a serious illness. The goal is to improve quality of life for both the patient and the family.  We discussed a brief life review of the patient and her declining health since March 2018. Cyndi tells me her mother hasn't been in good health since she was in her 28's.   Discussed in detail hospital diagnoses, interventions, and underlying co-morbidities. Educated on disease trajectory of progressive dementia and high risk for recurrent hospitalizations secondary to poor nutritional status, dehydration, and recurrent infections.   Richard tells me she has a hematology appointment next week. He is also interested in following up outpatient with GI regarding anemia/positive stool this admission.   Advanced directives, concepts specific to code status, and artifical feeding and hydration were discussed in detail. Richard has a living will at home and speaks of his mother's previous wishes for "everything to be done." Educated on medical  recommendation for DNR/DNI with age, frailty, underlying co-morbidities, and poor chance of survival or meaningful recovery. Cyndi and Tabetha seem to consider the medical recommendation for DNR. Richard speaks firmly of his wishes for FULL code and he believes his mother would want FULL code. He speaks of wanting "everything done by man" and God will take her when it's her time to go.   We also discussed feeding tubes in detail including medical recommendation against feeding tubes with underlying dementia and risk for infection, aspiration, and dislodgement. Being a life-prolonging measure but not change the underlying progressive dementia. Cyndi speaks of being interested in short term feeding tube if absolutely necessary to see  if her mother would show "improvement" and if it made her more agitated or she pulled the tube out, then the family would be "at peace" knowing they "tried."   Educated on MOST form. Provided Hard Choices copy to review. I strongly encouraged the children to review her living will together and consider their mother's EOL wishes with progressive dementia. Explained that they are faced with big decisions in the near future.   Delfino Lovett tells me she has been followed by outpatient palliative services. I encouraged him to continue communication with outpatient palliative provider. Briefly discussed her eligibility for hospice services.   Richard spends time speaking of his mother's deep Christian faith. He believes this is out of our control. Therapeutic listening.   Questions and concerns were addressed. PMT contact information given.    Length of Stay: 6  Current Medications: Scheduled Meds:  . carvedilol  12.5 mg Oral BID WC  . cholecalciferol  2,000 Units Oral Daily  . feeding supplement (ENSURE ENLIVE)  237 mL Oral TID BM  . ferrous sulfate  325 mg Oral BID WC  . furosemide  40 mg Intravenous Daily  . levETIRAcetam  500 mg Oral BID  . [START ON 11/10/2017]  levothyroxine  100 mcg Oral QAC breakfast  . mirtazapine  15 mg Oral QHS  . pantoprazole  40 mg Oral Daily  . polyethylene glycol  17 g Oral Daily  . potassium chloride  40 mEq Oral Q4H  . senna-docusate  2 tablet Oral BID  . sertraline  75 mg Oral Daily    Continuous Infusions:   PRN Meds: haloperidol lactate, ketorolac, ondansetron (ZOFRAN) IV  Physical Exam  Constitutional: She appears lethargic. She appears ill.  HENT:  Head: Normocephalic and atraumatic.  Pulmonary/Chest: No accessory muscle usage. No tachypnea. No respiratory distress.  Abdominal: Normal appearance. There is no tenderness.  Neurological: She appears lethargic.  Skin: Skin is warm and dry. There is pallor.  Psychiatric: Cognition and memory are impaired. She is noncommunicative. She is inattentive.  Nursing note and vitals reviewed.          Vital Signs: BP (!) 160/66 (BP Location: Right Arm)   Pulse 81   Temp 97.6 F (36.4 C) (Axillary)   Resp 16   Ht 5' 1"  (1.549 m)   Wt 55.2 kg (121 lb 11.1 oz)   SpO2 96%   BMI 22.99 kg/m  SpO2: SpO2: 96 % O2 Device: O2 Device: Room Air O2 Flow Rate:    Intake/output summary:   Intake/Output Summary (Last 24 hours) at 11/09/2017 1258 Last data filed at 11/09/2017 0900 Gross per 24 hour  Intake 220 ml  Output 1200 ml  Net -980 ml   LBM: Last BM Date: 11/03/17 Baseline Weight: Weight: 48.1 kg (106 lb) Most recent weight: Weight: 55.2 kg (121 lb 11.1 oz)  Palliative Assessment/Data: PPS 30%     Patient Active Problem List   Diagnosis Date Noted  . Palliative care by specialist   . Goals of care, counseling/discussion   . Altered mental status   . Pressure injury of skin 11/04/2017  . Hyperkalemia 11/04/2017  . UTI (urinary tract infection) 11/03/2017  . Protein-calorie malnutrition, severe 10/10/2017  . Anemia 07/23/2017  . Symptomatic anemia 11/27/2016  . Tonic-clonic seizure disorder ([place]) 10/09/2016  . Oropharyngeal dysphagia 10/09/2016  .  Moderate protein-calorie malnutrition ([place]) 10/09/2016  . Hypothyroidism (acquired) 10/09/2016  . Type 2 diabetes mellitus with peripheral vascular disease ([place]) 10/09/2016  . PVD (peripheral vascular disease) (  [place]) 10/09/2016  . Essential hypertension 10/09/2016  . Alzheimer's dementia 10/09/2016  . Chronic depression 10/09/2016  . Hyperlipidemia 10/09/2016  . Coronary artery disease involving native coronary artery of native heart without angina pectoris 10/09/2016    Palliative Care Assessment & Plan   Patient Profile: 84 y.o. female  with past medical history of Alzheimer's dementia, seizures, anemia, systolic CHF, cardiomyopathy, pacemaker, CAD, HTN, HLD, DM, PVD admitted on 11/03/2017 with altered mental status and poor oral intake. In ED, patient with hyperkalemia, Hgb 8, and FOBT positive. Found to have UTI and receiving IV rocephin. Hgb dropped to 7.1 on 4/23 and patient receiving 2 units PRBC. Underlying dementia. Dietician following for protein calorie malnutrition. PT/SLP evaluations limited by patient fatigue and lethargy. Palliative medicine consultation for goals of care.   Assessment: Acute encephalopathy Alzheimer's dementia UTI Hyperkalemia Moderate protein-calorie malnutrition Anemia  Recommendations/Plan:  After long discussion with children, plan is for continued FULL code/FULL scope.   Family interested in tube feeding if necessary to prolong her life.   Patient has been followed by outpatient palliative at SNF. Encouraged family to continue communication with outpatient palliative provider.   MOST form and Hard Choices copy given. Patient has a living will. I strongly encouraged family to continue conversations regarding [place] and EOL wishes.   Goals of Care and Additional Recommendations:  Limitations on Scope of Treatment: Full Scope Treatment  Code Status: FULL   Code Status Orders  (From admission, onward)        Start     Ordered   11/03/17 1648   Full code  Continuous     11/03/17 1648    Code Status History    Date Active Date Inactive Code Status Order ID Comments User Context   10/09/2017 2031 10/10/2017 1831 Full Code [id number]  Norval Morton, MD ED   07/23/2017 1509 07/24/2017 1809 Full Code [id number]  Phillips Grout, MD Inpatient   11/27/2016 2203 11/28/2016 2154 Full Code [id number]  Rise Patience, MD Inpatient       Prognosis:   Unable to determine guarded with declining functional/cognitive/nutritional status secondary to underlying Alzheimer's dementia. High risk for re-hospitalization.   Discharge Planning:  To Be Determined  Care plan was discussed with RN, Dr. Starla Link, son, daughter, DIL  Thank you for allowing the Palliative Medicine Team to assist in the care of this patient.   Time In: 1450 Time Out: 1610 Total Time 48mn Prolonged Time Billed yes      Greater than 50%  of this time was spent counseling and coordinating care related to the above assessment and plan.  MIhor Dow FNP-C Palliative Medicine Team  Phone: [phone]: [phone] Please contact Palliative Medicine Team phone at [phone] questions and concerns.

## 2017-11-10 DIAGNOSIS — R1310 Dysphagia, unspecified: Secondary | ICD-10-CM

## 2017-11-10 LAB — CBC WITH DIFFERENTIAL/PLATELET
BASOS ABS: 0 10*3/uL (ref 0.0–0.1)
Basophils Relative: 0 %
EOS PCT: 2 %
Eosinophils Absolute: 0.1 10*3/uL (ref 0.0–0.7)
HCT: 33.6 % — ABNORMAL LOW (ref 36.0–46.0)
HEMOGLOBIN: 11.1 g/dL — AB (ref 12.0–15.0)
LYMPHS ABS: 1.8 10*3/uL (ref 0.7–4.0)
LYMPHS PCT: 29 %
MCH: 32 pg (ref 26.0–34.0)
MCHC: 33 g/dL (ref 30.0–36.0)
MCV: 96.8 fL (ref 78.0–100.0)
Monocytes Absolute: 0.4 10*3/uL (ref 0.1–1.0)
Monocytes Relative: 6 %
NEUTROS PCT: 63 %
Neutro Abs: 3.8 10*3/uL (ref 1.7–7.7)
PLATELETS: 74 10*3/uL — AB (ref 150–400)
RBC: 3.47 MIL/uL — ABNORMAL LOW (ref 3.87–5.11)
RDW: 17.9 % — ABNORMAL HIGH (ref 11.5–15.5)
WBC: 6 10*3/uL (ref 4.0–10.5)

## 2017-11-10 LAB — GLUCOSE, CAPILLARY
GLUCOSE-CAPILLARY: 140 mg/dL — AB (ref 65–99)
GLUCOSE-CAPILLARY: 74 mg/dL (ref 65–99)
Glucose-Capillary: 71 mg/dL (ref 65–99)
Glucose-Capillary: 69 mg/dL (ref 65–99)
Glucose-Capillary: 79 mg/dL (ref 65–99)

## 2017-11-10 LAB — BASIC METABOLIC PANEL
ANION GAP: 9 (ref 5–15)
BUN: 13 mg/dL (ref 6–20)
CHLORIDE: 107 mmol/L (ref 101–111)
CO2: 25 mmol/L (ref 22–32)
Calcium: 8.2 mg/dL — ABNORMAL LOW (ref 8.9–10.3)
Creatinine, Ser: 0.43 mg/dL — ABNORMAL LOW (ref 0.44–1.00)
GFR calc Af Amer: >60 mL/min (ref >60)
GLUCOSE: 87 mg/dL (ref 65–99)
Potassium: 2.5 mmol/L — CL (ref 3.5–5.1)
SODIUM: 141 mmol/L (ref 135–145)

## 2017-11-10 LAB — MAGNESIUM: MAGNESIUM: 1.5 mg/dL — AB (ref 1.7–2.4)

## 2017-11-10 MED ORDER — MAGNESIUM SULFATE 2 GM/50ML IV SOLN
2.0000 g | Freq: Once | INTRAVENOUS | Status: AC
  Administered 2017-11-10: 2 g via INTRAVENOUS
  Filled 2017-11-10: qty 50

## 2017-11-10 MED ORDER — LEVETIRACETAM IN NACL 500 MG/100ML IV SOLN
500.0000 mg | Freq: Two times a day (BID) | INTRAVENOUS | Status: DC
  Administered 2017-11-10: 500 mg via INTRAVENOUS
  Filled 2017-11-10: qty 100

## 2017-11-10 MED ORDER — FLUCONAZOLE 100 MG PO TABS
100.0000 mg | ORAL_TABLET | Freq: Every day | ORAL | Status: DC
  Administered 2017-11-11: 100 mg via ORAL
  Filled 2017-11-10 (×2): qty 1

## 2017-11-10 MED ORDER — POTASSIUM CHLORIDE 10 MEQ/100ML IV SOLN
10.0000 meq | INTRAVENOUS | Status: AC
  Administered 2017-11-10 (×6): 10 meq via INTRAVENOUS
  Filled 2017-11-10 (×6): qty 100

## 2017-11-10 MED ORDER — LEVETIRACETAM 500 MG PO TABS
500.0000 mg | ORAL_TABLET | Freq: Two times a day (BID) | ORAL | Status: DC
  Administered 2017-11-10: 500 mg via ORAL
  Filled 2017-11-10: qty 1

## 2017-11-10 NOTE — Care Management Important Message (Signed)
Important Message  Patient Details  Name: Jean Hodges MRN: [id number] Date of Birth: 1934-06-02   Medicare Important Message Given:  Yes    Kerin Salen 11/10/2017, 11:41 AMImportant Message  Patient Details  Name: Jean Hodges MRN: [id number] Date of Birth: 02/12/1934   Medicare Important Message Given:  Yes    Kerin Salen 11/10/2017, 11:41 AM

## 2017-11-10 NOTE — Progress Notes (Signed)
Patient ID: Jean Hodges, female   DOB: [birth year], 83 y.o.   MRN: [id number]  PROGRESS NOTE    Jean Hodges  JJK:[id number] DOB: Jun 02, 1934 DOA: 11/03/2017 PCP: Patient, No Pcp Per   Brief Narrative:  84 year old female with history of peripheral vascular disease, Alzheimer's dementia, chronic atrial fibrillation, chronic systolic heart failure, oropharyngeal dysphagia presented from SNF with altered mental status and poor oral intake.  She was started on intravenous antibiotics for probable UTI   Assessment & Plan:   Active Problems:   Dysphagia   Moderate protein-calorie malnutrition (HCC)   Type 2 diabetes mellitus with peripheral vascular disease (HCC)   Alzheimer's dementia   UTI (urinary tract infection)   Pressure injury of skin   Hyperkalemia   Palliative care by specialist   Goals of care, counseling/discussion   Altered mental status  Acute metabolic encephalopathy -In a patient with dementia.  -Patient has waxing and waning mental status.  Currently hardly wakes up.    CAT scan of the brain was negative for acute abnormality.  EEG was negative for epileptiform activity.  MRI of the brain could not be done because of pacemaker - Monitor mental status.  Fall precautions -Diet as per SLP recommendations  Recurrent UTI -Treated with IV Rocephin which was discontinued on 11/08/2017.  Urine cultures showed multiple organisms.  Repeat urine culture from 11/06/2017 showed insignificant growth.    Acute on chronic systolic heart failure -Continue IV Lasix.  Monitor input and output.  Daily weights. -2D echo showed EF of 20 to 25% with moderate- severe mitral and tricuspid regurgitation. -Outpatient follow-up with cardiology -Respiratory status stable  Hypokalemia -Replace.  Repeat a.m. labs  Hypomagnesemia -Replace.  Repeat a.m. labs  Moderate protein calorie malnutrition -Follow nutrition recommendations -Because of patient's mental status, her oral intake is very poor.   If her oral intake remains poor, patient might need alternative modes of nutrition including PEG tube. -Continue empiric Diflucan to see if there is any improvement in oral intake.  Alzheimer's dementia without behavioral disturbances -Monitor mental status.  -Overall prognosis is very poor because of mental status and very poor oral intake.  Palliative care following.  As per the family meeting yesterday with palliative care team, patient remains full code and family is pursuing full scope of treatment including artificial feeding tube if needed.  Diabetes mellitus type 2 with peripheral vascular disease -Continue Accu-Cheks.  Blood sugars stable  Stage I pressure injury over her coccyx -Continue skin care  Mild drop in hemoglobin in a patient with chronic anemia -Status post 2 units transfusion on 11/04/2017.  Hemoglobin is 11.1 today.  Repeat a.m. labs.  No overt signs of GI bleeding.   -Patient has had multiple admissions for acute on chronic anemia. -Patient was supposed to be followed up with outpatient hematology.  Patient had refused bone marrow biopsy in 2017 during outpatient [facility] hematology visit.  Outpatient follow-up with hematology -We will hold off on any further inpatient consultations including GI/hematology.  I do not think the patient is in a condition to undergo any GI procedures.  Consider outpatient evaluation with GI if family is interested.  DVT prophylaxis: SCDs Code Status: Full Family Communication: Spoke to son, Contrina Orona on phone on 11/09/2017 Disposition Plan: Probable discharge back to rehab facility in the next 1 or 2 days if oral intake improves.  Consultants: Palliative care  Procedures: None  Antimicrobials:  Rocephin from 11/03/2017 onwards   Subjective: Patient seen and examined at bedside.  Patient  is sleeping, hardly wakes up on calling her name.  No overnight fever, nausea or vomiting   objective: Vitals:   11/09/17 0453 11/09/17 1254  11/09/17 2053 11/10/17 0516  BP: (!) 160/80 (!) 160/66 (!) 162/72 (!) 170/70  Pulse:  81 79 78  Resp:  _0 Temp:  97.6 F (36.4 C) 97.6 F (36.4 C) 97.9 F (36.6 C)  TempSrc:  Axillary Axillary Oral  SpO2:  96% 97% 94%  Weight:      Height:        Intake/Output Summary (Last 24 hours) at 11/10/2017 1138 Last data filed at 11/10/2017 0557 Gross per 24 hour  Intake 150 ml  Output 1400 ml  Net -1250 ml   Filed Weights   11/07/17 0428 11/08/17 0424 11/09/17 0443  Weight: 50 kg (110 lb 3.7 oz) 49.4 kg (108 lb 14.5 oz) 55.2 kg (121 lb 11.1 oz)    Examination:  General exam: Very thinly built frail elderly female, sleeping, hardly wakes up.  No distress respiratory system: Bilateral decreased breath sounds at bases with scattered basilar crackles  cardiovascular system: S1-S2 heard, rate controlled  gastrointestinal system: Abdomen is nondistended, soft and nontender. Normal bowel sounds heard. Extremities: No cyanosis, edema.  Right BKA    Data Reviewed: I have personally reviewed following labs and imaging studies  CBC: Recent Labs  Lab 11/04/17 0631 11/06/17 0544 11/07/17 0547 11/09/17 0601 11/10/17 0617  WBC 5.7 7.3 9.1 9.4 6.0  NEUTROABS  --  5.1 6.3 5.4 3.8  HGB 7.1* 11.0* 12.0 11.9* 11.1*  HCT 21.1* 33.1* 36.0 36.6 33.6*  MCV 103.4* 96.8 97.0 97.6 96.8  PLT 104* 100* 109* 97* 74*   Basic Metabolic Panel: Recent Labs  Lab 11/04/17 0631 11/06/17 0544 11/07/17 0547 11/09/17 0601 11/10/17 0617  NA 144 143 143 143 141  K 3.2* 3.4* 3.6 2.8* 2.5*  CL 111 114* 114* 110 107  CO2 24 22 20* 25 25  GLUCOSE 52* 137* 120* 129* 87  BUN _1 CREATININE 0.46 0.51 0.46 0.53 0.43*  CALCIUM 8.3* 8.0* 8.4* 8.5* 8.2*  MG  --  1.3* 1.4* 1.3* 1.5*   GFR: Estimated Creatinine Clearance: 41.6 mL/min (A) (by C-G formula based on SCr of 0.43 mg/dL (L)). Liver Function Tests: Recent Labs  Lab 11/03/17 1148 11/04/17 0631 11/07/17 0547  AST 54* 40 37    ALT 39 34 32  ALKPHOS 367* 292* 256*  BILITOT 1.3* 1.2 1.3*  PROT 6.2* 5.3* 5.7*  ALBUMIN 2.7* 2.4* 2.5*   No results for input(s): LIPASE, AMYLASE in the last 168 hours. No results for input(s): AMMONIA in the last 168 hours. Coagulation Profile: No results for input(s): INR, PROTIME in the last 168 hours. Cardiac Enzymes: No results for input(s): CKTOTAL, CKMB, CKMBINDEX, TROPONINI in the last 168 hours. BNP (last 3 results) No results for input(s): PROBNP in the last 8760 hours. HbA1C: No results for input(s): HGBA1C in the last 72 hours. CBG: Recent Labs  Lab 11/09/17 1654 11/09/17 2056 11/09/17 2356 11/10/17 0407 11/10/17 0753  GLUCAP 104* 103* 96 71 74   Lipid Profile: No results for input(s): CHOL, HDL, LDLCALC, TRIG, CHOLHDL, LDLDIRECT in the last 72 hours. Thyroid Function Tests: No results for input(s): TSH, T4TOTAL, FREET4, T3FREE, THYROIDAB in the last 72 hours. Anemia Panel: No results for input(s): VITAMINB12, FOLATE, FERRITIN, TIBC, IRON, RETICCTPCT in the last 72 hours. Sepsis Labs: Recent Labs  Lab 11/03/17 1253  LATICACIDVEN 0.66  Recent Results (from the past 240 hour(s))  Urine culture     Status: Abnormal   Collection Time: 11/03/17 12:18 PM  Result Value Ref Range Status   Specimen Description   Final    URINE, CATHETERIZED Performed at [facility] [address], [place], [place] [address]    Special Requests   Final    NONE Performed at [facility], [place] [address], [place], [place] [address]    Culture MULTIPLE ORGANISMS PRESENT, NONE PREDOMINANT (A)  Final   Report Status 11/04/2017 FINAL  Final  Culture, Urine     Status: Abnormal   Collection Time: 11/06/17  9:38 PM  Result Value Ref Range Status   Specimen Description   Final    URINE, CLEAN CATCH Performed at [facility], [place] [address] [place], [place] [address]    Special Requests   Final    NONE Performed at  [facility], [place] [address] [place], [place] [address]    Culture (A)  Final    <10,000 COLONIES/mL INSIGNIFICANT GROWTH Performed at [facility] [address], [place], [place] [address]    Report Status 11/08/2017 FINAL  Final         Radiology Studies: Dg Chest Port 1 View  Result Date: 11/09/2017 CLINICAL DATA:  Dyspnea.  Urinary tract infection. EXAM: PORTABLE CHEST 1 VIEW COMPARISON:  11/07/2017 FINDINGS: AICD remains in stable position. Stable cardiomegaly and central pulmonary vascular congestion. Layering bilateral pleural effusions show no significant change. Persistent opacity seen in the left retrocardiac lung base, which may be due to atelectasis or consolidation. IMPRESSION: Stable layering bilateral pleural effusions and left retrocardiac atelectasis versus consolidation. Stable cardiomegaly and pulmonary vascular congestion. Electronically Signed   By: Earle Gell M.D.   On: 11/09/2017 15:15        Scheduled Meds: . carvedilol  12.5 mg Oral BID WC  . cholecalciferol  2,000 Units Oral Daily  . feeding supplement (ENSURE ENLIVE)  237 mL Oral TID BM  . ferrous sulfate  325 mg Oral BID WC  . fluconazole  100 mg Oral Daily  . furosemide  40 mg Intravenous Daily  . levETIRAcetam  500 mg Oral BID  . levothyroxine  100 mcg Oral QAC breakfast  . mirtazapine  15 mg Oral QHS  . nystatin  5 mL Oral QID  . pantoprazole  40 mg Oral Daily  . polyethylene glycol  17 g Oral Daily  . senna-docusate  2 tablet Oral BID  . sertraline  75 mg Oral Daily   Continuous Infusions: . magnesium sulfate 1 - 4 g bolus IVPB    . potassium chloride       LOS: 7 days        Aline August, MD [facility] Pager [phone]  If 7PM-7AM, please contact night-coverage [email or web address] Password [facility] 11/10/2017, 11:38 AM

## 2017-11-11 ENCOUNTER — Inpatient Hospital Stay (HOSPITAL_COMMUNITY): Payer: Medicare Other

## 2017-11-11 DIAGNOSIS — E46 Unspecified protein-calorie malnutrition: Secondary | ICD-10-CM

## 2017-11-11 DIAGNOSIS — R627 Adult failure to thrive: Secondary | ICD-10-CM

## 2017-11-11 LAB — GLUCOSE, CAPILLARY
GLUCOSE-CAPILLARY: 92 mg/dL (ref 65–99)
GLUCOSE-CAPILLARY: 84 mg/dL (ref 65–99)
GLUCOSE-CAPILLARY: 87 mg/dL (ref 65–99)
Glucose-Capillary: 127 mg/dL — ABNORMAL HIGH (ref 65–99)
Glucose-Capillary: 56 mg/dL — ABNORMAL LOW (ref 65–99)
Glucose-Capillary: 72 mg/dL (ref 65–99)
Glucose-Capillary: 73 mg/dL (ref 65–99)
Glucose-Capillary: 76 mg/dL (ref 65–99)
Glucose-Capillary: 88 mg/dL (ref 65–99)

## 2017-11-11 LAB — CBC WITH DIFFERENTIAL/PLATELET
BASOS PCT: 0 %
Basophils Absolute: 0 10*3/uL (ref 0.0–0.1)
EOS ABS: 0.1 10*3/uL (ref 0.0–0.7)
Eosinophils Relative: 2 %
HEMATOCRIT: 32.3 % — AB (ref 36.0–46.0)
HEMOGLOBIN: 10.7 g/dL — AB (ref 12.0–15.0)
LYMPHS ABS: 2 10*3/uL (ref 0.7–4.0)
Lymphocytes Relative: 29 %
MCH: 32.4 pg (ref 26.0–34.0)
MCHC: 33.1 g/dL (ref 30.0–36.0)
MCV: 97.9 fL (ref 78.0–100.0)
MONOS PCT: 5 %
Monocytes Absolute: 0.4 10*3/uL (ref 0.1–1.0)
NEUTROS ABS: 4.6 10*3/uL (ref 1.7–7.7)
NEUTROS PCT: 64 %
Platelets: 72 10*3/uL — ABNORMAL LOW (ref 150–400)
RBC: 3.3 MIL/uL — ABNORMAL LOW (ref 3.87–5.11)
RDW: 17.9 % — AB (ref 11.5–15.5)
WBC: 7.1 10*3/uL (ref 4.0–10.5)

## 2017-11-11 LAB — BASIC METABOLIC PANEL
Anion gap: 6 (ref 5–15)
BUN: 15 mg/dL (ref 6–20)
CALCIUM: 8.5 mg/dL — AB (ref 8.9–10.3)
CHLORIDE: 107 mmol/L (ref 101–111)
CO2: 27 mmol/L (ref 22–32)
CREATININE: 0.5 mg/dL (ref 0.44–1.00)
GFR calc Af Amer: >60 mL/min (ref >60)
GFR calc non Af Amer: >60 mL/min (ref >60)
Glucose, Bld: 138 mg/dL — ABNORMAL HIGH (ref 65–99)
Potassium: 3.6 mmol/L (ref 3.5–5.1)
Sodium: 140 mmol/L (ref 135–145)

## 2017-11-11 LAB — MAGNESIUM: Magnesium: 1.8 mg/dL (ref 1.7–2.4)

## 2017-11-11 MED ORDER — FLUCONAZOLE 100MG IVPB
100.0000 mg | INTRAVENOUS | Status: DC
  Administered 2017-11-11 – 2017-11-13 (×3): 100 mg via INTRAVENOUS
  Filled 2017-11-11 (×4): qty 50

## 2017-11-11 MED ORDER — LEVETIRACETAM IN NACL 500 MG/100ML IV SOLN
500.0000 mg | Freq: Two times a day (BID) | INTRAVENOUS | Status: DC
  Administered 2017-11-11 – 2017-11-14 (×7): 500 mg via INTRAVENOUS
  Filled 2017-11-11 (×9): qty 100

## 2017-11-11 NOTE — Progress Notes (Signed)
Daily Progress Note   Patient Name: Jean Hodges       Date: 11/09/17 DOB: 1934-06-19  Age: 84 y.o. MRN#: [id number] Attending Physician: Aline August, MD Primary Care Physician: Patient, No Pcp Per Admit Date: 11/03/2017  Reason for Consultation/Follow-up: Establishing goals of care  Subjective: Patient opens eyes to voice but remains lethargic. Does not accept food that I offer her. No s/s of pain or discomfort.   GOC:  Spoke with son, Delfino Lovett via telephone this morning. Long palliative meeting with Richard on 4/28 and also his sister via telephone. Plan was for continued FULL code/FULL scope treatment. "Everything to be done by man" including feeding tube/resusciation. Richard asks me about pursuing feeding tube. He is worried his mother will discharge back to the nursing facility and end up right back in the hospital. I did agree with his concern that she is high risk for re-hospitalization secondary to progressive dementia, dehydration, poor nutritional status, and recurrent infection.   Spoke with Dr. Starla Link regarding feeding tube placement. Will need PEG tube for feeds to be continued when discharged back to SNF.   Length of Stay: 8  Current Medications: Scheduled Meds:  . carvedilol  12.5 mg Oral BID WC  . cholecalciferol  2,000 Units Oral Daily  . feeding supplement (ENSURE ENLIVE)  237 mL Oral TID BM  . ferrous sulfate  325 mg Oral BID WC  . fluconazole  100 mg Oral Daily  . furosemide  40 mg Intravenous Daily  . levETIRAcetam  500 mg Oral BID  . levothyroxine  100 mcg Oral QAC breakfast  . mirtazapine  15 mg Oral QHS  . nystatin  5 mL Oral QID  . pantoprazole  40 mg Oral Daily  . polyethylene glycol  17 g Oral Daily  . senna-docusate  2 tablet Oral BID  . sertraline  75 mg  Oral Daily    Continuous Infusions:   PRN Meds: haloperidol lactate, ondansetron (ZOFRAN) IV  Physical Exam  Constitutional: She appears lethargic. She appears ill.  HENT:  Head: Normocephalic and atraumatic.  Pulmonary/Chest: No accessory muscle usage. No tachypnea. No respiratory distress.  Abdominal: Normal appearance. There is no tenderness.  Neurological: She appears lethargic.  Skin: Skin is warm and dry. There is pallor.  Psychiatric: Cognition and memory are impaired. She is noncommunicative. She  is inattentive.  Nursing note and vitals reviewed.          Vital Signs: BP (!) 167/71 (BP Location: Right Arm)   Pulse 80   Temp 98.5 F (36.9 C) (Oral)   Resp 18   Ht 5\' 1"  (1.549 m)   Wt 52.3 kg (115 lb 4.8 oz)   SpO2 97%   BMI 21.79 kg/m  SpO2: SpO2: 97 % O2 Device: O2 Device: Room Air O2 Flow Rate:    Intake/output summary:  No intake or output data in the 24 hours ending 11/11/17 0908 LBM: Last BM Date: 11/03/17 Baseline Weight: Weight: 48.1 kg (106 lb) Most recent weight: Weight: 52.3 kg (115 lb 4.8 oz)  Palliative Assessment/Data: PPS 20%     Patient Active Problem List   Diagnosis Date Noted  . Palliative care by specialist   . Goals of care, counseling/discussion   . Altered mental status   . Pressure injury of skin 11/04/2017  . Hyperkalemia 11/04/2017  . UTI (urinary tract infection) 11/03/2017  . Protein-calorie malnutrition, severe 10/10/2017  . Anemia 07/23/2017  . Symptomatic anemia 11/27/2016  . Tonic-clonic seizure disorder ([place]) 10/09/2016  . Dysphagia 10/09/2016  . Moderate protein-calorie malnutrition ([place]) 10/09/2016  . Hypothyroidism (acquired) 10/09/2016  . Type 2 diabetes mellitus with peripheral vascular disease ([place]) 10/09/2016  . PVD (peripheral vascular disease) ([place]) 10/09/2016  . Essential hypertension 10/09/2016  . Alzheimer's dementia 10/09/2016  . Chronic depression 10/09/2016  . Hyperlipidemia 10/09/2016  . Coronary  artery disease involving native coronary artery of native heart without angina pectoris 10/09/2016    Palliative Care Assessment & Plan   Patient Profile: 84 y.o. female  with past medical history of Alzheimer's dementia, seizures, anemia, systolic CHF, cardiomyopathy, pacemaker, CAD, HTN, HLD, DM, PVD admitted on 11/03/2017 with altered mental status and poor oral intake. In ED, patient with hyperkalemia, Hgb 8, and FOBT positive. Found to have UTI and receiving IV rocephin. Hgb dropped to 7.1 on 4/23 and patient receiving 2 units PRBC. Underlying dementia. Dietician following for protein calorie malnutrition. PT/SLP evaluations limited by patient fatigue and lethargy. Palliative medicine consultation for goals of care.   Assessment: Acute encephalopathy Alzheimer's dementia UTI Hyperkalemia Moderate protein-calorie malnutrition Anemia  Recommendations/Plan:  After long discussion with children on 4/28, plan is for continued FULL code/FULL scope.   Family interested in tube feeding if necessary to prolong her life. Discussed with Dr. Starla Link. IR consulted.    Recommend continued palliative f/u outpatient at SNF. High risk for recurrent hospitalization.   Goals of Care and Additional Recommendations:  Limitations on Scope of Treatment: Full Scope Treatment  Code Status: FULL   Code Status Orders  (From admission, onward)        Start     Ordered   11/03/17 1648  Full code  Continuous     11/03/17 1648    Code Status History    Date Active Date Inactive Code Status Order ID Comments User Context   10/09/2017 2031 10/10/2017 1831 Full Code [id number]  Norval Morton, MD ED   07/23/2017 1509 07/24/2017 1809 Full Code [id number]  Phillips Grout, MD Inpatient   11/27/2016 2203 11/28/2016 2154 Full Code [id number]  Rise Patience, MD Inpatient       Prognosis:   Unable to determine guarded with declining functional/cognitive/nutritional status secondary to underlying  Alzheimer's dementia. High risk for re-hospitalization.   Discharge Planning:  To Be Determined  Care plan was discussed with RN, Dr. Starla Link,  son, IR PA/MD  Thank you for allowing the Palliative Medicine Team to assist in the care of this patient.   Time In: 0900- 1625- Time Out: 0910 1640 Total Time 82min Prolonged Time Billed no      Greater than 50%  of this time was spent counseling and coordinating care related to the above assessment and plan.  Ihor Dow, FNP-C Palliative Medicine Team  Phone: [phone] Fax: [phone]  Please contact Palliative Medicine Team phone at [phone] for questions and concerns.

## 2017-11-11 NOTE — Progress Notes (Signed)
Medications were crushed in apple sauce. Patient only took 3 bites which was about 1/2 of the mixture. Patient then said she did not want anymore and to "leave her alone". Not sure how much of the medication patient was able to receive.

## 2017-11-11 NOTE — Progress Notes (Signed)
Blood sugar 56 on accucheck. Fed pt peanut butter and orange juice, blood sugar 76 upon recheck. Will continue to monitor at this time.

## 2017-11-11 NOTE — Progress Notes (Signed)
[place] from [facility].  Patient is not medically stable for dc today.   LCSW will continue to follow for disposition.   Carolin Coy [place]

## 2017-11-11 NOTE — Progress Notes (Addendum)
Nutrition Follow-up  DOCUMENTATION CODES:   Non-severe (moderate) malnutrition in context of chronic illness  INTERVENTION:   Osmolite 1.5 @ 20 ml/hr increase by 10 ml every 6 hours to goal rate of 45 ml/hr Free water flushes 200 ml Q6 hours  Provides: 1620 kcals, 67 grams protein, 821 ml free water.   NUTRITION DIAGNOSIS:   Moderate Malnutrition related to chronic illness(dementia/dysphagia) as evidenced by moderate fat depletion, moderate muscle depletion, severe muscle depletion.  Ongoing  GOAL:   Patient will meet greater than or equal to 90% of their needs  Not Meeting  MONITOR:   PO intake, Supplement acceptance, Weight trends, Labs  REASON FOR ASSESSMENT:   Consult Assessment of nutrition requirement/status  ASSESSMENT:   Patient with PMH significant for PVD, cardiomyopathy, CHF, right BKA, HTN, HLD, and oropharyngeal dysphagia. Recently admitted 3/29 for low hemoglobin requiring blood transfusions. Presents this admission with recurrent UTI and possible GI bleed.    Pt continues to hold applesauce and medications in mouth requiring multiple reminders to swallow. Meal completions charted as 0-25% for her last 7 meals. Palliative spoke with family 4/28, and they desire a feeding tube. Discussions with IR are taking place per MD note. RD to provide recommendations.   Weight noted to increase 6 lb since last RD visit on 4/23 (109 lb to 115 lb). Will continue to monitor trends.   Medications reviewed and include: Vit D, 40 mg lasix once/day, Remeron Labs reviewed.   Diet Order:   Diet Order           DIET - DYS 1 Room service appropriate? Yes; Fluid consistency: Thin  Diet effective now          EDUCATION NEEDS:   Not appropriate for education at this time  Skin:  Skin Assessment: Skin Integrity Issues: Skin Integrity Issues:: Stage I Stage I: coccyx  Last BM:  11/03/17  Height:   Ht Readings from Last 1 Encounters:  11/04/17 5\' 1"  (1.549 m)     Weight:   Wt Readings from Last 1 Encounters:  11/11/17 115 lb 4.8 oz (52.3 kg)    Ideal Body Weight:  46 kg(adjusted BKA)  BMI:  Body mass index is 21.79 kg/m.  Estimated Nutritional Needs:   Kcal:  1450-1650 kcal  Protein:  65-75 g  Fluid:  >1.4 L/day    Mariana Single RD, LDN Clinical Nutrition Pager # [phone]

## 2017-11-11 NOTE — Progress Notes (Signed)
Patient ID: Jean Hodges, female   DOB: May 31, 1934, 84 y.o.   MRN: [id number]  PROGRESS NOTE    Gagandeep Kossman  LOV:[id number] DOB: 12/18/1933 DOA: 11/03/2017 PCP: Patient, No Pcp Per   Brief Narrative:  84 year old female with history of peripheral vascular disease, Alzheimer's dementia, chronic atrial fibrillation, chronic systolic heart failure, oropharyngeal dysphagia presented from SNF with altered mental status and poor oral intake.  She was started on intravenous antibiotics for probable UTI   Assessment & Plan:   Active Problems:   Dysphagia   Moderate protein-calorie malnutrition (HCC)   Type 2 diabetes mellitus with peripheral vascular disease (H[facility])   Alzheimer's dementia   UTI (urinary tract infection)   Pressure injury of skin   Hyperkalemia   Palliative care by specialist   Goals of care, counseling/discussion   Altered mental status  Acute metabolic encephalopathy -In a patient with dementia.  -Patient has waxing and waning mental status.  Currently hardly wakes up.    CAT scan of the brain was negative for acute abnormality.  EEG was negative for epileptiform activity.  MRI of the brain could not be done because of pacemaker - Monitor mental status.  Fall precautions -Diet as per SLP recommendations  Recurrent UTI -Treated with IV Rocephin which was discontinued on 11/08/2017.  Urine cultures showed multiple organisms.  Repeat urine culture from 11/06/2017 showed insignificant growth.    Acute on chronic systolic heart failure -Continue IV Lasix.  Monitor input and output.  Daily weights. -2D echo showed EF of 20 to 25% with moderate- severe mitral and tricuspid regurgitation. -Outpatient follow-up with cardiology -Respiratory status stable.  Repeat a.m. chest x-ray  Hypokalemia -Improved.  Repeat a.m. labs  Hypomagnesemia -Improved.  Repeat a.m. labs  Moderate protein calorie malnutrition -Follow nutrition recommendations -Because of patient's mental status,  her oral intake is very poor.  -We will switch Diflucan empirically to IV as her oral intake is still very poor -Patient's family want to proceed with feeding tube placement although the prognosis is very poor.  We will consult IR for same  Alzheimer's dementia without behavioral disturbances -Monitor mental status.  -Overall prognosis is very poor because of mental status and very poor oral intake.  Palliative care following.  As per the family meeting recently with palliative care team, patient remains full code and family is pursuing full scope of treatment including artificial feeding tube if needed.  Thrombocytopenia -Questionable cause.  Monitor. no signs of bleeding  Diabetes mellitus type 2 with peripheral vascular disease -Continue Accu-Cheks.  Blood sugars stable  Stage I pressure injury over her coccyx -Continue skin care  Mild drop in hemoglobin in a patient with chronic anemia -Status post 2 units transfusion on 11/04/2017.  Hemoglobin is 10.7 today.  Repeat a.m. labs.  No overt signs of GI bleeding.   -Patient has had multiple admissions for acute on chronic anemia. -Patient was supposed to be followed up with outpatient hematology.  Patient had refused bone marrow biopsy in 2017 during outpatient [facility] hematology visit.  Outpatient follow-up with hematology -We will hold off on any further inpatient consultations including GI/hematology.  I do not think the patient is in a condition to undergo any GI procedures.  Consider outpatient evaluation with GI if family is interested.  DVT prophylaxis: SCDs Code Status: Full Family Communication: Spoke to son, Ajanay Farve on phone on 11/09/2017 Disposition Plan: Probable discharge back to rehab facility after feeding tube placement and once PEG tube feeding  starts Consultants: Palliative  care/IR  Procedures:  Echo on 11/07/2017 Study Conclusions  - Left ventricle: The cavity size was normal. Wall thickness was   normal.  Systolic function was severely reduced. The estimated   ejection fraction was in the range of 20% to 25%. - Mitral valve: There was moderate to severe regurgitation. - Left atrium: The atrium was severely dilated. - Right atrium: The atrium was moderately dilated. - Tricuspid valve: There was moderate-severe regurgitation. - Pulmonary arteries: Systolic pressure was moderately increased.   PA peak pressure: 44 mm Hg (S). - Pericardium, extracardiac: A trivial pericardial effusion was   identified. There was a left pleural effusion.  Antimicrobials:  Rocephin from 11/03/2017-11/07/2017 Diflucan 1 dose on 11/09/2017   Subjective: Patient seen and examined at bedside.  Patient hardly wakes up on calling her name, does not answer questions.  Very poor oral intake as per nursing staff.  No overnight fever or vomiting.  objective: Vitals:   11/10/17 0516 11/10/17 1320 11/11/17 0500 11/11/17 0504  BP: (!) 170/70 (!) 163/62  (!) 167/71  Pulse: 78 79  80  Resp: _0 Temp: 97.9 F (36.6 C) 97.8 F (36.6 C)  98.5 F (36.9 C)  TempSrc: Oral Oral  Oral  SpO2: 94% 96%  97%  Weight:  52.5 kg (115 lb 11.9 oz) 52.3 kg (115 lb 4.8 oz)   Height:       No intake or output data in the 24 hours ending 11/11/17 1154 Filed Weights   11/09/17 0443 11/10/17 1320 11/11/17 0500  Weight: 55.2 kg (121 lb 11.1 oz) 52.5 kg (115 lb 11.9 oz) 52.3 kg (115 lb 4.8 oz)    Examination:  General exam: No acute distress.  Very thinly built, frail and elderly female lying in bed.  Hardly wakes up  respiratory system: Bilateral decreased breath sounds at bases with basilar crackles cardiovascular system: Rate controlled, S1-S2 heard  gastrointestinal system: Abdomen is nondistended, soft and nontender. Normal bowel sounds heard. Extremities: No cyanosis, edema.  Right BKA    Data Reviewed: I have personally reviewed following labs and imaging studies  CBC: Recent Labs  Lab 11/06/17 0544 11/07/17 0547  11/09/17 0601 11/10/17 0617 11/11/17 0716  WBC 7.3 9.1 9.4 6.0 7.1  NEUTROABS 5.1 6.3 5.4 3.8 4.6  HGB 11.0* 12.0 11.9* 11.1* 10.7*  HCT 33.1* 36.0 36.6 33.6* 32.3*  MCV 96.8 97.0 97.6 96.8 97.9  PLT 100* 109* 97* 74* 72*   Basic Metabolic Panel: Recent Labs  Lab 11/06/17 0544 11/07/17 0547 11/09/17 0601 11/10/17 0617 11/11/17 0716  NA 143 143 143 141 140  K 3.4* 3.6 2.8* 2.5* 3.6  CL 114* 114* 110 107 107  CO2 22 20* _1 GLUCOSE 137* 120* 129* 87 138*  BUN _2 CREATININE 0.51 0.46 0.53 0.43* 0.50  CALCIUM 8.0* 8.4* 8.5* 8.2* 8.5*  MG 1.3* 1.4* 1.3* 1.5* 1.8   GFR: Estimated Creatinine Clearance: 41.6 mL/min (by C-G formula based on SCr of 0.5 mg/dL). Liver Function Tests: Recent Labs  Lab 11/07/17 0547  AST 37  ALT 32  ALKPHOS 256*  BILITOT 1.3*  PROT 5.7*  ALBUMIN 2.5*   No results for input(s): LIPASE, AMYLASE in the last 168 hours. No results for input(s): AMMONIA in the last 168 hours. Coagulation Profile: No results for input(s): INR, PROTIME in the last 168 hours. Cardiac Enzymes: No results for input(s): CKTOTAL, CKMB, CKMBINDEX, TROPONINI in the last 168 hours. BNP (last  3 results) No results for input(s): PROBNP in the last 8760 hours. HbA1C: No results for input(s): HGBA1C in the last 72 hours. CBG: Recent Labs  Lab 11/11/17 0000 11/11/17 0507 11/11/17 0523 11/11/17 0728 11/11/17 1119  GLUCAP 72 56* 76 127* 92   Lipid Profile: No results for input(s): CHOL, HDL, LDLCALC, TRIG, CHOLHDL, LDLDIRECT in the last 72 hours. Thyroid Function Tests: No results for input(s): TSH, T4TOTAL, FREET4, T3FREE, THYROIDAB in the last 72 hours. Anemia Panel: No results for input(s): VITAMINB12, FOLATE, FERRITIN, TIBC, IRON, RETICCTPCT in the last 72 hours. Sepsis Labs: No results for input(s): PROCALCITON, LATICACIDVEN in the last 168 hours.  Recent Results (from the past 240 hour(s))  Urine culture     Status: Abnormal   Collection  Time: 11/03/17 12:18 PM  Result Value Ref Range Status   Specimen Description   Final    URINE, CATHETERIZED Performed at [facility] [address] [place], [place] [address]    Special Requests   Final    NONE Performed at [facility], [place] [address], [place], [place] [address]    Culture MULTIPLE ORGANISMS PRESENT, NONE PREDOMINANT (A)  Final   Report Status 11/04/2017 FINAL  Final  Culture, Urine     Status: Abnormal   Collection Time: 11/06/17  9:38 PM  Result Value Ref Range Status   Specimen Description   Final    URINE, CLEAN CATCH Performed at [facility], [place] [address] [place], [place] [address]    Special Requests   Final    NONE Performed at [facility], [place] [address] [place], [place] [address]    Culture (A)  Final    <10,000 COLONIES/mL INSIGNIFICANT GROWTH Performed at [facility] [address], [place], [place] [address]    Report Status 11/08/2017 FINAL  Final         Radiology Studies: No results found.      Scheduled Meds: . carvedilol  12.5 mg Oral BID WC  . cholecalciferol  2,000 Units Oral Daily  . feeding supplement (ENSURE ENLIVE)  237 mL Oral TID BM  . ferrous sulfate  325 mg Oral BID WC  . fluconazole  100 mg Oral Daily  . furosemide  40 mg Intravenous Daily  . levothyroxine  100 mcg Oral QAC breakfast  . mirtazapine  15 mg Oral QHS  . nystatin  5 mL Oral QID  . pantoprazole  40 mg Oral Daily  . polyethylene glycol  17 g Oral Daily  . senna-docusate  2 tablet Oral BID  . sertraline  75 mg Oral Daily   Continuous Infusions: . levETIRAcetam 500 mg (11/11/17 1041)     LOS: 8 days        Aline August, MD [facility] Pager [phone]  If 7PM-7AM, please contact night-coverage [email or web address] Password [facility] 11/11/2017, 11:54 AM

## 2017-11-12 ENCOUNTER — Inpatient Hospital Stay (HOSPITAL_COMMUNITY): Payer: Medicare Other

## 2017-11-12 DIAGNOSIS — E43 Unspecified severe protein-calorie malnutrition: Secondary | ICD-10-CM

## 2017-11-12 LAB — BASIC METABOLIC PANEL
Anion gap: 8 (ref 5–15)
BUN: 12 mg/dL (ref 6–20)
CO2: 27 mmol/L (ref 22–32)
Calcium: 8.5 mg/dL — ABNORMAL LOW (ref 8.9–10.3)
Chloride: 104 mmol/L (ref 101–111)
Creatinine, Ser: 0.39 mg/dL — ABNORMAL LOW (ref 0.44–1.00)
GFR calc Af Amer: >60 mL/min (ref >60)
GLUCOSE: 77 mg/dL (ref 65–99)
POTASSIUM: 3.4 mmol/L — AB (ref 3.5–5.1)
SODIUM: 139 mmol/L (ref 135–145)

## 2017-11-12 LAB — CBC WITH DIFFERENTIAL/PLATELET
Basophils Absolute: 0 10*3/uL (ref 0.0–0.1)
Basophils Relative: 0 %
EOS PCT: 1 %
Eosinophils Absolute: 0.1 10*3/uL (ref 0.0–0.7)
HCT: 31.1 % — ABNORMAL LOW (ref 36.0–46.0)
Hemoglobin: 10.2 g/dL — ABNORMAL LOW (ref 12.0–15.0)
LYMPHS PCT: 35 %
Lymphs Abs: 2.1 10*3/uL (ref 0.7–4.0)
MCH: 31.9 pg (ref 26.0–34.0)
MCHC: 32.8 g/dL (ref 30.0–36.0)
MCV: 97.2 fL (ref 78.0–100.0)
Monocytes Absolute: 0.3 10*3/uL (ref 0.1–1.0)
Monocytes Relative: 5 %
NEUTROS ABS: 3.4 10*3/uL (ref 1.7–7.7)
Neutrophils Relative %: 59 %
PLATELETS: 69 10*3/uL — AB (ref 150–400)
RBC: 3.2 MIL/uL — ABNORMAL LOW (ref 3.87–5.11)
RDW: 17.4 % — ABNORMAL HIGH (ref 11.5–15.5)
WBC: 5.9 10*3/uL (ref 4.0–10.5)

## 2017-11-12 LAB — MAGNESIUM: MAGNESIUM: 1.4 mg/dL — AB (ref 1.7–2.4)

## 2017-11-12 LAB — GLUCOSE, CAPILLARY
GLUCOSE-CAPILLARY: 75 mg/dL (ref 65–99)
GLUCOSE-CAPILLARY: 112 mg/dL — AB (ref 65–99)
GLUCOSE-CAPILLARY: 106 mg/dL — AB (ref 65–99)
Glucose-Capillary: 73 mg/dL (ref 65–99)
Glucose-Capillary: 85 mg/dL (ref 65–99)

## 2017-11-12 LAB — PROTIME-INR
INR: 1.37
Prothrombin Time: 16.7 seconds — ABNORMAL HIGH (ref 11.4–15.2)

## 2017-11-12 MED ORDER — POTASSIUM CHLORIDE 10 MEQ/100ML IV SOLN
10.0000 meq | INTRAVENOUS | Status: AC
  Administered 2017-11-12 (×2): 10 meq via INTRAVENOUS
  Filled 2017-11-12 (×2): qty 100

## 2017-11-12 MED ORDER — MAGNESIUM SULFATE 2 GM/50ML IV SOLN
2.0000 g | Freq: Once | INTRAVENOUS | Status: AC
  Administered 2017-11-12: 2 g via INTRAVENOUS
  Filled 2017-11-12: qty 50

## 2017-11-12 MED ORDER — IBUPROFEN 100 MG/5ML PO SUSP
400.0000 mg | Freq: Four times a day (QID) | ORAL | Status: DC | PRN
  Administered 2017-11-12: 400 mg via ORAL
  Filled 2017-11-12: qty 20

## 2017-11-12 NOTE — Progress Notes (Signed)
Daily Progress Note   Patient Name: Jean Hodges       Date: 11/09/17 DOB: 18-Apr-1934  Age: 84 y.o. MRN#: [id number] Attending Physician: Jani Gravel, MD Primary Care Physician: Patient, No Pcp Per Admit Date: 11/03/2017  Reason for Consultation/Follow-up: Establishing goals of care  Subjective: Patient awake when I entered the room. She is moaning and complains of pain but cannot indicate where her pain is. Repositioned with RN. I stayed at bedside and attempted to encourage patient to eat breakfast. She pocketed the first bite I gave to her.  [place]OC:  I attempted to call daughter, Jean Hodges. VM left but unfortunately no return call throughout the day. Jean Hodges lives in [place], [place].   [place]: Spoke with son, Jean Hodges, via telephone. He talked with Dr. Maudie Mercury this morning. I reiterated to him that IR feels his mother is not a candidate for feeding tube due to anatomy and frailty. We again discussed course of hospitalization. Educated on disease trajectory of dementia and normal expectations of end-stage dementia including poor nutritional status and dysphagia. Explained poor prognosis secondary to poor nutritional status and failure to thrive.   Jean Hodges remains unrealistic with the 'big picture' of his mother's clinical decline secondary to advancing dementia. He asks if there are other options in regards to nutrition. I explained NGT but this is temporary and my concern with this causing agitation and distress to his mother. He also asks me about having caregivers at the nursing facility sit with her for hours to ensure she eats. I explained the concern that she is pocketing food and not accepting but of course would encourage continued comfort feeds if she will accept. I also explained that this would likely be an  out of pocket expensive--Jean Hodges tells me he will call nursing director at [facility] and see if she can help.   I explained big decisions him and sister are faced with. Jean Hodges and I attempted to conference call with Jean Hodges via telephone but she did not answer. I encouraged Jean Hodges to call me back today when him and sister are available to talk together. Jean Hodges did not call me back.    Length of Stay: 9  Current Medications: Scheduled Meds:  . carvedilol  12.5 mg Oral BID WC  . cholecalciferol  2,000 Units Oral Daily  . feeding supplement (ENSURE ENLIVE)  237 mL Oral TID BM  . ferrous sulfate  325 mg Oral BID WC  . furosemide  40 mg Intravenous Daily  . levothyroxine  100 mcg Oral QAC breakfast  . mirtazapine  15 mg Oral QHS  . nystatin  5 mL Oral QID  . pantoprazole  40 mg Oral Daily  . polyethylene glycol  17 g Oral Daily  . senna-docusate  2 tablet Oral BID  . sertraline  75 mg Oral Daily    Continuous Infusions: . fluconazole (DIFLUCAN) IV Stopped (11/11/17 1610)  . levETIRAcetam Stopped (11/11/17 2224)  . magnesium sulfate 1 - 4 g bolus IVPB 2 g (11/12/17 0938)    PRN Meds: haloperidol lactate, ibuprofen, ondansetron (ZOFRAN) IV  Physical Exam  Constitutional: She appears cachectic. She appears ill.  HENT:  Head: Normocephalic and atraumatic.  Pulmonary/Chest: No accessory muscle usage. No tachypnea. No respiratory distress.  Abdominal: Normal appearance. She exhibits no distension. There is no tenderness.  Neurological: She is alert.  Pleasantly confused but will answer questions  Skin: Skin is warm and dry. There is pallor.  Psychiatric: Her speech is normal. Cognition and memory are impaired.  Nursing note and vitals reviewed.          Vital Signs: BP (!) 156/70 (BP Location: Right Arm)   Pulse 78   Temp 98.1 F (36.7 C) (Oral)   Resp 18   Ht 5\' 1"  (1.549 m)   Wt 51.4 kg (113 lb 5.1 oz)   SpO2 97%   BMI 21.41 kg/m  SpO2: SpO2: 97 % O2 Device: O2 Device: Room  Air O2 Flow Rate:    Intake/output summary:   Intake/Output Summary (Last 24 hours) at 11/12/2017 0956 Last data filed at 11/12/2017 2297 Gross per 24 hour  Intake 140 ml  Output 900 ml  Net -760 ml   LBM: Last BM Date: 11/03/17 Baseline Weight: Weight: 48.1 kg (106 lb) Most recent weight: Weight: 51.4 kg (113 lb 5.1 oz)  Palliative Assessment/Data: PPS 20%     Patient Active Problem List   Diagnosis Date Noted  . Adult failure to thrive   . Palliative care by specialist   . Goals of care, counseling/discussion   . Altered mental status   . Pressure injury of skin 11/04/2017  . Hyperkalemia 11/04/2017  . UTI (urinary tract infection) 11/03/2017  . Protein-calorie malnutrition ([place]) 10/10/2017  . Anemia 07/23/2017  . Symptomatic anemia 11/27/2016  . Tonic-clonic seizure disorder ([place]) 10/09/2016  . Dysphagia 10/09/2016  . Moderate protein-calorie malnutrition ([place]) 10/09/2016  . Hypothyroidism (acquired) 10/09/2016  . Type 2 diabetes mellitus with peripheral vascular disease ([place]) 10/09/2016  . PVD (peripheral vascular disease) ([place]) 10/09/2016  . Essential hypertension 10/09/2016  . Alzheimer's dementia 10/09/2016  . Chronic depression 10/09/2016  . Hyperlipidemia 10/09/2016  . Coronary artery disease involving native coronary artery of native heart without angina pectoris 10/09/2016    Palliative Care Assessment & Plan   Patient Profile: 84 y.o. female  with past medical history of Alzheimer's dementia, seizures, anemia, systolic CHF, cardiomyopathy, pacemaker, CAD, HTN, HLD, DM, PVD admitted on 11/03/2017 with altered mental status and poor oral intake. In ED, patient with hyperkalemia, Hgb 8, and FOBT positive. Found to have UTI and receiving IV rocephin. Hgb dropped to 7.1 on 4/23 and patient receiving 2 units PRBC. Underlying dementia. Dietician following for protein calorie malnutrition. PT/SLP evaluations limited by patient fatigue and lethargy. Palliative medicine  consultation for goals of care.   Assessment: Acute encephalopathy Alzheimer's  dementia UTI Hyperkalemia Moderate protein-calorie malnutrition Anemia  Recommendations/Plan:  After multiple conversations, son continues to request FULL code/FULL scope treatment. He remains unrealistic in the fact that her poor nutritional status is secondary to advancing dementia. Jean Hodges and I attempted a conference call with his sister but she did not answer. I encouraged Jean Hodges to call me back today when sister available to talk but he did not return my call.   I explained to son that IR will not place PEG tube. I explained concern with placing NGT secondary to underlying dementia and this being short-term (will not be able to return to SNF with NGT).  High risk for decompensation and recurrent hospitalization.   PMT will follow.   Goals of Care and Additional Recommendations:  Limitations on Scope of Treatment: Full Scope Treatment  Code Status: FULL   Code Status Orders  (From admission, onward)        Start     Ordered   11/03/17 1648  Full code  Continuous     11/03/17 1648    Code Status History    Date Active Date Inactive Code Status Order ID Comments User Context   10/09/2017 2031 10/10/2017 1831 Full Code [id number]  Norval Morton, MD ED   07/23/2017 1509 07/24/2017 1809 Full Code [id number]  Phillips Grout, MD Inpatient   11/27/2016 2203 11/28/2016 2154 Full Code [id number]  Rise Patience, MD Inpatient       Prognosis:   Unable to determine guarded with declining functional/cognitive/nutritional status secondary to underlying Alzheimer's dementia. High risk for re-hospitalization.   Discharge Planning:  To Be Determined  Care plan was discussed with RN, Dr. Maudie Mercury, son,   Thank you for allowing the Palliative Medicine Team to assist in the care of this patient.   Time In: 0915 Time Out: 0950 Total Time 62min Prolonged Time Billed no      Greater than 50%  of this  time was spent counseling and coordinating care related to the above assessment and plan.  Ihor Dow, FNP-C Palliative Medicine Team  Phone: [phone] Fax: [phone]  Please contact Palliative Medicine Team phone at [phone] for questions and concerns.

## 2017-11-12 NOTE — Progress Notes (Addendum)
SLP Cancellation Note  Patient Details Name: Tristin Gladman MRN: [id number] DOB: [birth year]   Cancelled treatment:       Reason Eval/Treat Not Completed: Other (comment)(pt not taking in po intake and moaning, note plans for possible PEG but unable to placed by IR due to anatomy, will sign off, please reorder slp eval if indicated or desires)   Luanna Salk, [facility] SLP [phone]

## 2017-11-12 NOTE — Progress Notes (Signed)
Patient ID: Jean Hodges, female   DOB: 07/24/1933, 84 y.o.   MRN: [id number]                                                                PROGRESS NOTE                                                                                                                                                                                                             Patient Demographics:    Jean Hodges, is a 84 y.o. female, DOB - 02/11/1934, WPV:[id number]  Admit date - 11/03/2017   Admitting Physician Kayleen Memos, DO  Outpatient Primary MD for the patient is Patient, No Pcp Per  LOS - 9  Outpatient Specialists:    Chief Complaint  Patient presents with  . Altered Mental Status       Brief Narrative   85 y.o. female with medical history significant for peripheral vascular disease, cardiomyopathy, chronic atrial fibrillation, chronic systolic heart failure, oropharyngeal dysphagia, who presented to [facility] ED from SNF with altered mental status.  History is obtained from her son, ED physician, and medical records due to the patient's confusion in the setting of advanced dementia. She has been more somnolent with poor oral intake.  ED Course: On presentation to the ED, lab studies remarkable for hyperkalemia, drop in hemoglobin. FOBT positive.      Subjective:    Jean Hodges today has been afebrile overnite. Pt has baseline dementia. Unable to provide hx.  Per RN, IR came by but didn't place PEG.  Very poor PO intake . Spoke with son this morning, still interested in PEG if possible.   No headache, No chest pain, No abdominal pain - No Nausea, No new weakness tingling or numbness, No Cough - SOB   Assessment  & Plan :    Active Problems:   Dysphagia   Moderate protein-calorie malnutrition (HCC)   Type 2 diabetes mellitus with peripheral vascular disease (HCC)   Alzheimer's dementia   Protein-calorie malnutrition (HCC)   UTI (urinary tract infection)   Pressure injury of skin  Hyperkalemia   Palliative care by specialist   Goals of care, counseling/discussion   Altered mental status   Adult failure to thrive      Dysphagia   Moderate protein-calorie  malnutrition ([place])   Type 2 diabetes mellitus with peripheral vascular disease ([place])   Alzheimer's dementia   UTI (urinary tract infection)   Pressure injury of skin   Hyperkalemia   Palliative care by specialist   Goals of care, counseling/discussion   Awaiting feeding tube   Altered mental status Acute metabolic encephalopathy -In a patient with dementia.  -Patient has waxing and waning mental status.  Currently hardly wakes up.    CAT scan of the brain was negative for acute abnormality.  EEG was negative for epileptiform activity.  MRI of the brain could not be done because of pacemaker - Monitor mental status.  Fall precautions -Diet as per SLP recommendations  Recurrent UTI -Treated with IV Rocephin which was discontinued on 11/08/2017.  Urine cultures showed multiple organisms.  Repeat urine culture from 11/06/2017 showed insignificant growth.    Acute on chronic systolic heart failure -Continue IV Lasix.  Monitor input and output.  Daily weights. -2D echo showed EF of 20 to 25% with moderate- severe mitral and tricuspid regurgitation. -Outpatient follow-up with cardiology -Respiratory status stable.  Repeat a.m. chest x-ray  Hypokalemia -Improved.  Repeat a.m. labs  Hypomagnesemia -Improved.  Repeat a.m. labs  Moderate protein calorie malnutrition -Follow nutrition recommendations -Because of patient's mental status, her oral intake is very poor.  -We will switch Diflucan empirically to IV as her oral intake is still very poor -Patient's family want to proceed with feeding tube placement although the prognosis is very poor.    Alzheimer's dementia without behavioral disturbances -Monitor mental status.  -Overall prognosis is very poor because of mental status and very poor oral  intake.  Palliative care following.  As per the family meeting recently with palliative care team, patient remains full code and family is pursuing full scope of treatment including artificial feeding tube if needed.  Thrombocytopenia -Questionable cause.  Monitor. no signs of bleeding  Diabetes mellitus type 2 with peripheral vascular disease -Continue Accu-Cheks.  Blood sugars stable  Stage I pressure injury over her coccyx -Continue skin care  Mild drop in hemoglobin in a patient with chronic anemia -Status post 2 units transfusion on 11/04/2017.  Hemoglobin is 10.7 today.  Repeat a.m. labs.  No overt signs of GI bleeding.   -Patient has had multiple admissions for acute on chronic anemia. -Patient was supposed to be followed up with outpatient hematology.  Patient had refused bone marrow biopsy in 2017 during outpatient [facility] hematology visit.  Outpatient follow-up with hematology -We will hold off on any further inpatient consultations including GI/hematology.  I do not think the patient is in a condition to undergo any GI procedures.  Consider outpatient evaluation with GI if family is interested.  DVT prophylaxis: SCDs Code Status: Full Family Communication: Spoke to son, [name] on phone on 11/12/2017 Disposition Plan: Probable discharge back to rehab facility after feeding tube placement and once PEG tube feeding  starts Consultants: Palliative care/IR  Procedures:  Echo on 11/07/2017 Study Conclusions  - Left ventricle: The cavity size was normal. Wall thickness was normal. Systolic function was severely reduced. The estimated ejection fraction was in the range of 20% to 25%. - Mitral valve: There was moderate to severe regurgitation. - Left atrium: The atrium was severely dilated. - Right atrium: The atrium was moderately dilated. - Tricuspid valve: There was moderate-severe regurgitation. - Pulmonary arteries: Systolic pressure was moderately increased. PA  peak pressure: 44 mm Hg (S). - Pericardium, extracardiac: A trivial pericardial effusion was identified.  There was a left pleural effusion.  Antimicrobials:  Rocephin from 11/03/2017-11/07/2017 Diflucan 1 dose on 11/09/2017    Lab Results  Component Value Date   PLT 69 (L) 11/12/2017    Antibiotics  :    Anti-infectives (From admission, onward)   Start     Dose/Rate Route Frequency Ordered Stop   11/11/17 1400  fluconazole (DIFLUCAN) IVPB 100 mg     100 mg 50 mL/hr over 60 Minutes Intravenous Every 24 hours 11/11/17 1203     11/10/17 1200  fluconazole (DIFLUCAN) tablet 100 mg  Status:  Discontinued     100 mg Oral Daily 11/10/17 0900 11/11/17 1203   11/09/17 1400  fluconazole (DIFLUCAN) IVPB 100 mg  Status:  Discontinued     100 mg 50 mL/hr over 60 Minutes Intravenous Every 24 hours 11/09/17 1300 11/10/17 0900   11/03/17 1300  cefTRIAXone (ROCEPHIN) 1 g in sodium chloride 0.9 % 100 mL IVPB  Status:  Discontinued     1 g 200 mL/hr over 30 Minutes Intravenous Every 24 hours 11/03/17 1255 11/08/17 1119        Objective:   Vitals:   11/11/17 1941 11/12/17 0432 11/12/17 0432 11/12/17 0700  BP: (!) 152/63 (!) 156/70 (!) 156/70   Pulse: 79 78 78   Resp: _0 Temp: (!) 97.5 F (36.4 C) 98.1 F (36.7 C) 98.1 F (36.7 C)   TempSrc: Oral Oral Oral   SpO2: 98% 100% 97%   Weight:    51.4 kg (113 lb 5.1 oz)  Height:        Wt Readings from Last 3 Encounters:  11/12/17 51.4 kg (113 lb 5.1 oz)  10/09/17 48.5 kg (106 lb 14.8 oz)  07/24/17 51.8 kg (114 lb 3.2 oz)     Intake/Output Summary (Last 24 hours) at 11/12/2017 0911 Last data filed at 11/12/2017 6712 Gross per 24 hour  Intake 140 ml  Output 900 ml  Net -760 ml     Physical Exam  Awake Alert, Oriented X 1, No new F.N deficits, Normal affect [place].AT,PERRAL Supple Neck,No JVD, No cervical lymphadenopathy appriciated.  Symmetrical Chest wall movement, Good air movement bilaterally, CTAB RRR,No Gallops,Rubs or  new Murmurs, No Parasternal Heave +ve B.Sounds, Abd Soft, No tenderness, No organomegaly appriciated, No rebound - guarding or rigidity. No Cyanosis, Clubbing or edema, No new Rash or bruise      Data Review:    CBC Recent Labs  Lab 11/07/17 0547 11/09/17 0601 11/10/17 0617 11/11/17 0716 11/12/17 0631  WBC 9.1 9.4 6.0 7.1 5.9  HGB 12.0 11.9* 11.1* 10.7* 10.2*  HCT 36.0 36.6 33.6* 32.3* 31.1*  PLT 109* 97* 74* 72* 69*  MCV 97.0 97.6 96.8 97.9 97.2  MCH 32.3 31.7 32.0 32.4 31.9  MCHC 33.3 32.5 33.0 33.1 32.8  RDW 19.3* 18.4* 17.9* 17.9* 17.4*  LYMPHSABS 2.1 3.4 1.8 2.0 2.1  MONOABS 0.5 0.4 0.4 0.4 0.3  EOSABS 0.1 0.2 0.1 0.1 0.1  BASOSABS 0.0 0.0 0.0 0.0 0.0    Chemistries  Recent Labs  Lab 11/07/17 0547 11/09/17 0601 11/10/17 0617 11/11/17 0716 11/12/17 0631  NA 143 143 141 140 139  K 3.6 2.8* 2.5* 3.6 3.4*  CL 114* 110 107 107 104  CO2 20* _1 GLUCOSE 120* 129* 87 138* 77  BUN _2 CREATININE 0.46 0.53 0.43* 0.50 0.39*  CALCIUM 8.4* 8.5* 8.2* 8.5* 8.5*  MG 1.4* 1.3* 1.5* 1.8 1.4*  AST 37  --   --   --   --   ALT 32  --   --   --   --   ALKPHOS 256*  --   --   --   --   BILITOT 1.3*  --   --   --   --    ------------------------------------------------------------------------------------------------------------------ No results for input(s): CHOL, HDL, LDLCALC, TRIG, CHOLHDL, LDLDIRECT in the last 72 hours.  Lab Results  Component Value Date   HGBA1C 6.2 10/15/2016   ------------------------------------------------------------------------------------------------------------------ No results for input(s): TSH, T4TOTAL, T3FREE, THYROIDAB in the last 72 hours.  Invalid input(s): FREET3 ------------------------------------------------------------------------------------------------------------------ No results for input(s): VITAMINB12, FOLATE, FERRITIN, TIBC, IRON, RETICCTPCT in the last 72 hours.  Coagulation profile Recent Labs  Lab  11/12/17 0631  INR 1.37    No results for input(s): DDIMER in the last 72 hours.  Cardiac Enzymes No results for input(s): CKMB, TROPONINI, MYOGLOBIN in the last 168 hours.  Invalid input(s): CK ------------------------------------------------------------------------------------------------------------------    Component Value Date/Time   BNP 162.3 (H) 12/05/2016 2316    Inpatient Medications  Scheduled Meds: . carvedilol  12.5 mg Oral BID WC  . cholecalciferol  2,000 Units Oral Daily  . feeding supplement (ENSURE ENLIVE)  237 mL Oral TID BM  . ferrous sulfate  325 mg Oral BID WC  . furosemide  40 mg Intravenous Daily  . levothyroxine  100 mcg Oral QAC breakfast  . mirtazapine  15 mg Oral QHS  . nystatin  5 mL Oral QID  . pantoprazole  40 mg Oral Daily  . polyethylene glycol  17 g Oral Daily  . senna-docusate  2 tablet Oral BID  . sertraline  75 mg Oral Daily   Continuous Infusions: . fluconazole (DIFLUCAN) IV Stopped (11/11/17 1610)  . levETIRAcetam Stopped (11/11/17 2224)   PRN Meds:.haloperidol lactate, ondansetron (ZOFRAN) IV  Micro Results Recent Results (from the past 240 hour(s))  Urine culture     Status: Abnormal   Collection Time: 11/03/17 12:18 PM  Result Value Ref Range Status   Specimen Description   Final    URINE, CATHETERIZED Performed at [facility], [place] [address] [place], [place] [address]    Special Requests   Final    NONE Performed at [facility], [place] [address], [place], [place] [address]    Culture MULTIPLE ORGANISMS PRESENT, NONE PREDOMINANT (A)  Final   Report Status 11/04/2017 FINAL  Final  Culture, Urine     Status: Abnormal   Collection Time: 11/06/17  9:38 PM  Result Value Ref Range Status   Specimen Description   Final    URINE, CLEAN CATCH Performed at [facility], [place] [address] [place], [place] [address]    Special Requests   Final    NONE Performed at [facility], [place] [address] [place], [place] [address]    Culture (A)  Final    <10,000 COLONIES/mL INSIGNIFICANT GROWTH Performed at [place] [address] [place], [place] [address]    Report Status 11/08/2017 FINAL  Final    Radiology Reports Ct Abdomen Wo Contrast  Result Date: 11/12/2017 CLINICAL DATA:  Evaluate anatomy prior to potential percutaneous gastrostomy tube placement EXAM: CT ABDOMEN WITHOUT CONTRAST TECHNIQUE: Multidetector CT imaging of the abdomen was performed following the standard protocol without IV contrast. COMPARISON:  02/03/2015; 10/26/2014 FINDINGS: Lower chest: Limited visualization of the lower thorax demonstrates interval increase in moderate to large  bilateral pleural effusions with associated bibasilar atelectasis/collapse. Cardiomegaly. Lead tips terminate within the right atrium, ventricle and coronary sinus. Calcifications of the mitral valve annulus. No pericardial effusion. Hepatobiliary: Nodular hepatic contour, unchanged. There is diffuse increased attenuation of the hepatic parenchyma. Trace amount of perihepatic ascites. The gallbladder is not visualized. There is an approximately 1.7 x 1.3 cm stone within the distal aspect of the CBD (image 27, series 5), with associated upstream dilatation of the common bile duct measuring 1.2 cm. This finding is also associated with suspected at least mild intrahepatic biliary ductal dilatation, incompletely evaluated on this noncontrast examination. Minimal amount of periportal edema suspected on this noncontrast examination. Pancreas: The pancreas is atrophic, unchanged. Spleen: Normal in size.  Splenic calcifications. Adrenals/Urinary Tract: No renal stones. Grossly unchanged appearance of previously characterized approximately 2.9 cm left-sided renal cyst. Note is again made of exaggerated horizontal lie of the right kidney. No urinary obstruction or perinephric stranding. Normal appearance the  bilateral adrenal glands. Urinary bladder is not imaged. Stomach/Bowel: The transverse colon is interposed between the anterior wall the stomach and ventral aspect of the abdomen (image 12, series 2). Nonobstructive bowel gas pattern. No pneumoperitoneum, pneumatosis or portal venous gas. Vascular/Lymphatic: Calcified atherosclerotic plaque within normal caliber abdominal aorta. There is a large amount of eccentric calcified plaque involving the caudal aspect of the proximal SMA (image 64, series 6), incompletely evaluated on this noncontrast examination though favored to results a least 50% luminal narrowing. Other: Diffuse body wall anasarca. Musculoskeletal: No acute or aggressive osseous abnormalities. Grade 1 anterolisthesis of L3 upon L4 measuring 4 mm. IMPRESSION: 1. Suboptimal anatomy to allow for percutaneous gastrostomy tube placement with the transverse colon interposed between the anterior wall of the stomach and ventral upper abdominal wall. If percutaneous gastrostomy tube placement is desired, would recommend the administration of either oral or rectal contrast prior to attempted placement. 2. Cardiomegaly with findings suggestive of pulmonary edema including moderate to large sized bilateral pleural effusions and diffuse body wall anasarca. 3. Note is made of an approximately 1.7 cm stone located within distal aspect of the CBD with associated dilatation of the extra and intrahepatic biliary system, incompletely evaluated on this noncontrast examination. Correlation with LFTs is advised. 4. Nodular hepatic contour suggestive of hepatic cirrhosis. 5. Diffuse increased attenuation of the hepatic parenchyma on this noncontrast examination, nonspecific though could be seen in the setting of chronic amiodarone therapy. Clinical correlation is advised. Electronically Signed   By: Sandi Mariscal M.D.   On: 11/12/2017 09:02   Ct Head Wo Contrast  Result Date: 11/06/2017 CLINICAL DATA:  History of  Alzheimer's dementia. Dysphagia. Altered mental status. EXAM: CT HEAD WITHOUT CONTRAST TECHNIQUE: Contiguous axial images were obtained from the base of the skull through the vertex without intravenous contrast. COMPARISON:  03/07/2015 FINDINGS: Brain: Generalized atrophy. Chronic small-vessel ischemic changes of the hemispheric white matter. 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 No sign of mass lesion, hemorrhage, hydrocephalus or extra-axial collection. Vascular: There is atherosclerotic calcification of the major vessels at the base of the brain. Skull: Negative Sinuses/Orbits: Clear/normal Other: None IMPRESSION: No acute finding by CT. Atrophy and chronic small-vessel ischemic changes. 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 Electronically Signed   By: Nelson Chimes M.D.   On: 11/06/2017 14:49   Dg Chest Port 1 View  Result Date: 11/12/2017 CLINICAL DATA:  Dyspnea EXAM: PORTABLE CHEST 1 VIEW COMPARISON:  11/09/2017; 11/07/2017; 07/23/2014 FINDINGS: Grossly unchanged cardiac silhouette and mediastinal contours post median sternotomy. Stable  positioning of support apparatus. Pulmonary vasculature is indistinct with cephalization of flow. Grossly unchanged moderate to large volume layering bilateral effusions with associated perihilar and bibasilar opacities. No pneumothorax. No acute osseus abnormalities. Vascular calcifications within the splenic artery. IMPRESSION: Similar findings of cardiomegaly, pulmonary edema and moderate to large sized bilateral pleural effusions. Electronically Signed   By: Sandi Mariscal M.D.   On: 11/12/2017 09:01   Dg Chest Port 1 View  Result Date: 11/09/2017 CLINICAL DATA:  Dyspnea.  Urinary tract infection. EXAM: PORTABLE CHEST 1 VIEW COMPARISON:  11/07/2017 FINDINGS: AICD remains in stable position. Stable cardiomegaly and central pulmonary vascular congestion. Layering bilateral pleural effusions show no significant change. Persistent opacity seen  in the left retrocardiac lung base, which may be due to atelectasis or consolidation. IMPRESSION: Stable layering bilateral pleural effusions and left retrocardiac atelectasis versus consolidation. Stable cardiomegaly and pulmonary vascular congestion. Electronically Signed   By: Earle Gell M.D.   On: 11/09/2017 15:15   Dg Chest Port 1 View  Result Date: 11/07/2017 CLINICAL DATA:  Shortness of breath EXAM: PORTABLE CHEST 1 VIEW COMPARISON:  July 23, 2017 FINDINGS: There is stable cardiomegaly with pacemaker leads attached to the right atrium, right ventricle, and coronary sinus. The pulmonary vascularity with mild pulmonary venous hypertension. There are bilateral pleural effusions. There is consolidation in the left lower lobe. There is slight underlying interstitial edema. There is aortic atherosclerosis. No evident adenopathy. There is degenerative change in each shoulder with superior migration of each humeral head. IMPRESSION: Pulmonary vascular congestion with mild pulmonary edema and pleural effusions. There may well be a degree of underlying congestive heart failure. There is consolidation in the left lower lobe concerning for pneumonia in this area. It should be noted that alveolar edema may also be present in this area. Stable pacemaker lead positions. No adenopathy evident. There is aortic atherosclerosis. Aortic Atherosclerosis (ICD10-I70.0). Electronically Signed   By: Lowella Grip III M.D.   On: 11/07/2017 13:02    Time Spent in minutes  30   Jani Gravel M.D on 11/12/2017 at 9:11 AM  Between 7am to 7pm - Pager - [phone]    After 7pm go to [email or web address] - password [facility]  [facility] -  Office  [phone]

## 2017-11-12 NOTE — Progress Notes (Signed)
Physical Therapy Discharge Patient Details Name: Jean Hodges MRN: [id number] DOB: 1933-09-08 Today's Date: 11/12/2017 Time:  -     Patient discharged from PT services secondary to medical decline - will need to re-order PT to resume therapy services.  Please see latest therapy progress note for current level of functioning and progress toward goals.    Progress and discharge plan discussed with patient and/or caregiver: family not present, pt unable   CSW reports pt is a long term resident of [facility].  RN reports pt not eating and pt moaning from room.  Pt also with hx of advanced dementia.  Pt does not appear to be appropriate for acute skilled PT needs at this time.  PT to sign off.     Tereasa Yilmaz,KATHrine E 11/12/2017, 10:49 AM

## 2017-11-12 NOTE — Progress Notes (Signed)
Dc to [facility] when medically stable.    Jean Hodges [place]

## 2017-11-12 NOTE — Progress Notes (Signed)
Patient ID: Jean Hodges, female   DOB: 1934-03-05, 84 y.o.   MRN: [id number] Request received for possible gastrostomy tube placement in patient.  Latest CT scan reviewed with Dr. Barbie Banner and shows that patient's colon is in front of the stomach which would preclude safe placement of percutaneous gastrostomy tube.  Consider surgical consultation but in view of patient's frail state this may not be possible as well.

## 2017-11-13 LAB — COMPREHENSIVE METABOLIC PANEL WITH GFR
ALT: 38 U/L (ref 14–54)
AST: 50 U/L — ABNORMAL HIGH (ref 15–41)
Albumin: 2.2 g/dL — ABNORMAL LOW (ref 3.5–5.0)
Alkaline Phosphatase: 181 U/L — ABNORMAL HIGH (ref 38–126)
Anion gap: 9 (ref 5–15)
BUN: 13 mg/dL (ref 6–20)
CO2: 28 mmol/L (ref 22–32)
Calcium: 8.5 mg/dL — ABNORMAL LOW (ref 8.9–10.3)
Chloride: 103 mmol/L (ref 101–111)
Creatinine, Ser: 0.46 mg/dL (ref 0.44–1.00)
GFR calc Af Amer: >60 mL/min (ref >60)
GFR calc non Af Amer: >60 mL/min (ref >60)
Glucose, Bld: 74 mg/dL (ref 65–99)
Potassium: 3.4 mmol/L — ABNORMAL LOW (ref 3.5–5.1)
Sodium: 140 mmol/L (ref 135–145)
Total Bilirubin: 0.9 mg/dL (ref 0.3–1.2)
Total Protein: 5.2 g/dL — ABNORMAL LOW (ref 6.5–8.1)

## 2017-11-13 LAB — GLUCOSE, CAPILLARY
Glucose-Capillary: 68 mg/dL (ref 65–99)
Glucose-Capillary: 72 mg/dL (ref 65–99)
Glucose-Capillary: 73 mg/dL (ref 65–99)
Glucose-Capillary: 75 mg/dL (ref 65–99)
Glucose-Capillary: 81 mg/dL (ref 65–99)
Glucose-Capillary: 81 mg/dL (ref 65–99)
Glucose-Capillary: 85 mg/dL (ref 65–99)

## 2017-11-13 LAB — MAGNESIUM: MAGNESIUM: 1.6 mg/dL — AB (ref 1.7–2.4)

## 2017-11-13 LAB — CBC
HCT: 32.5 % — ABNORMAL LOW (ref 36.0–46.0)
Hemoglobin: 10.5 g/dL — ABNORMAL LOW (ref 12.0–15.0)
MCH: 31.5 pg (ref 26.0–34.0)
MCHC: 32.3 g/dL (ref 30.0–36.0)
MCV: 97.6 fL (ref 78.0–100.0)
Platelets: 63 K/uL — ABNORMAL LOW (ref 150–400)
RBC: 3.33 MIL/uL — ABNORMAL LOW (ref 3.87–5.11)
RDW: 17.4 % — ABNORMAL HIGH (ref 11.5–15.5)
WBC: 5.6 K/uL (ref 4.0–10.5)

## 2017-11-13 MED ORDER — POTASSIUM CHLORIDE 10 MEQ/100ML IV SOLN
INTRAVENOUS | Status: AC
  Administered 2017-11-13: 10 meq
  Filled 2017-11-13: qty 100

## 2017-11-13 MED ORDER — POTASSIUM CHLORIDE 10 MEQ/100ML IV SOLN
10.0000 meq | INTRAVENOUS | Status: AC
  Administered 2017-11-13: 10 meq via INTRAVENOUS
  Filled 2017-11-13: qty 100

## 2017-11-13 MED ORDER — MAGNESIUM SULFATE 2 GM/50ML IV SOLN
2.0000 g | Freq: Once | INTRAVENOUS | Status: AC
  Administered 2017-11-13: 2 g via INTRAVENOUS
  Filled 2017-11-13: qty 50

## 2017-11-13 NOTE — Progress Notes (Signed)
Patient ID: Jean Hodges, female   DOB: [birth year], 84 y.o.   MRN: [id number]                                                                PROGRESS NOTE                                                                                                                                                                                                             Patient Demographics:    Jean Hodges, is a 84 y.o. female, DOB - Jul 23, 1933, CWU:[id number]  Admit date - 11/03/2017   Admitting Physician Kayleen Memos, DO  Outpatient Primary MD for the patient is Patient, No Pcp Per  LOS - 10  Outpatient Specialists    Chief Complaint  Patient presents with  . Altered Mental Status       Brief Narrative   84 y.o.femalewith medical history significant forperipheral vascular disease, cardiomyopathy, chronicatrial fibrillation, chronic systolic heart failure, oropharyngeal dysphagia, who presented to [facility] ED from SNF with altered mental status. History is obtained from her son,ED physician,and medical records due to the patient's confusion in the setting of advanced dementia. She has been more somnolent with poor oral intake.  ED Course:On presentation tothe ED, lab studies remarkable for hyperkalemia, drop in hemoglobin. FOBT positive.       Subjective:    Jean Hodges today has been afebrile.  Had a opportunity to speak with her son,  IR is not going to do PEG.  He is ok with her going to SNF without feeding tube and trying to eat.  If she is unable to maintain caloric intake he will request GI consultation for PEG while at SNF.   No headache, No chest pain, No abdominal pain - No Nausea, No new weakness tingling or numbness, No Cough - SOB.   Assessment  & Plan :    Active Problems:   Dysphagia   Moderate protein-calorie malnutrition (HCC)   Type 2 diabetes mellitus with peripheral vascular disease (HCC)   Alzheimer's dementia   Protein-calorie malnutrition (HCC)   UTI (urinary  tract infection)   Pressure injury of skin   Hyperkalemia   Palliative care by specialist   Goals of care, counseling/discussion   Altered mental status   Adult failure to thrive  Dysphagia Moderate protein-calorie malnutrition (HCC) Type 2 diabetes mellitus with peripheral vascular disease (HCC) Alzheimer's dementia UTI (urinary tract infection) Pressure injury of skin Hyperkalemia Palliative care by specialist Goals of care, counseling/discussion   No PEG< IR thought too risky for this procedure Discussed with her son,  We will dispo to SNF tomorrow If having difficulty with caloric intake then he will ask for GI consultation for PEG   Altered mental status Acute metabolic encephalopathy -In a patient with dementia.  -Patient has waxing and waning mental status. Currently hardly wakes up.  CAT scan of the brain was negative for acute abnormality. EEG was negative for epileptiform activity. MRI of the brain could not be done because of pacemaker - Monitor mental status. Fall precautions -Diet as per SLP recommendations  Recurrent UTI -Treated with IV Rocephin which was discontinued on 11/08/2017. Urine cultures showed multiple organisms. Repeat urine culture from 11/06/2017 showed insignificant growth.   Acute on chronic systolic heart failure -Continue IV Lasix. Monitor input and output. Daily weights. -2D echo showed EF of 20 to 25% with moderate- severe mitral and tricuspid regurgitation. -Outpatient follow-up with cardiology -Respiratory status stable. Repeat a.m. chest x-ray  Hypokalemia -Improved. Repeat a.m. labs  Hypomagnesemia -Improved. Repeat a.m. labs  Moderate protein calorie malnutrition -Follow nutrition recommendations -Because of patient's mental status, her oral intake is very poor.  -We will switch Diflucan empirically to IV as her oral intake is still very poor -Patient's family want to proceed with  feeding tube placement although the prognosis is very poor.   Alzheimer's dementia without behavioral disturbances -Monitor mental status.  -Overall prognosis is very poor because of mental status and very poor oral intake. Palliative care following. As per the family meeting recentlywith palliative care team, patient remains full code and family is pursuing full scope of treatment including artificial feeding tube if needed.  Thrombocytopenia -Questionable cause. Monitor.no signs of bleeding  Diabetes mellitus type 2 with peripheral vascular disease -Continue Accu-Cheks. Blood sugars stable  Stage I pressure injury over her coccyx -Continue skin care  Mild drop in hemoglobin in a patient with chronic anemia -Status post 2 units transfusion on 11/04/2017. Hemoglobin is 10.7today. Repeat a.m. labs. No overt signs of GI bleeding.  -Patient has had multiple admissions for acute on chronic anemia. -Patient was supposed to be followed up with outpatient hematology. Patient had refused bone marrow biopsy in 2017 during outpatient [facility] hematology visit. Outpatient follow-up with hematology -We will hold off on any further inpatient consultations including GI/hematology. I do not think the patient is in a condition to undergo any GI procedures. Consider outpatient evaluation with GI if family is interested.  DVT prophylaxis:SCDs Code Status:Full Family Communication:Spoke to son, Jean Hodges on phone on 11/12/2017 Disposition Plan:Probable discharge back to rehab facilityafter feeding tube placement and once PEG tube feeding starts Consultants:Palliative care/IR  Procedures: Echo on 11/07/2017 Study Conclusions  - Left ventricle: The cavity size was normal. Wall thickness was normal. Systolic function was severely reduced. The estimated ejection fraction was in the range of 20% to 25%. - Mitral valve: There was moderate to severe regurgitation. - Left  atrium: The atrium was severely dilated. - Right atrium: The atrium was moderately dilated. - Tricuspid valve: There was moderate-severe regurgitation. - Pulmonary arteries: Systolic pressure was moderately increased. PA peak pressure: 44 mm Hg (S). - Pericardium, extracardiac: A trivial pericardial effusion was identified. There was a left pleural effusion.  Antimicrobials:  Rocephin from 11/03/2017-11/07/2017 Diflucan 1 dose on 11/09/2017  Lab Results  Component Value Date   PLT 63 (L) 11/13/2017      Anti-infectives (From admission, onward)   Start     Dose/Rate Route Frequency Ordered Stop   11/11/17 1400  fluconazole (DIFLUCAN) IVPB 100 mg     100 mg 50 mL/hr over 60 Minutes Intravenous Every 24 hours 11/11/17 1203     11/10/17 1200  fluconazole (DIFLUCAN) tablet 100 mg  Status:  Discontinued     100 mg Oral Daily 11/10/17 0900 11/11/17 1203   11/09/17 1400  fluconazole (DIFLUCAN) IVPB 100 mg  Status:  Discontinued     100 mg 50 mL/hr over 60 Minutes Intravenous Every 24 hours 11/09/17 1300 11/10/17 0900   11/03/17 1300  cefTRIAXone (ROCEPHIN) 1 g in sodium chloride 0.9 % 100 mL IVPB  Status:  Discontinued     1 g 200 mL/hr over 30 Minutes Intravenous Every 24 hours 11/03/17 1255 11/08/17 1119        Objective:   Vitals:   11/12/17 0700 11/12/17 1402 11/13/17 0447 11/13/17 0900  BP:  (!) 137/53 (!) 152/77   Pulse:  79 79   Resp:  16 18   Temp:  98.5 F (36.9 C) (!) 97.5 F (36.4 C)   TempSrc:  Oral Oral   SpO2:  95% 96%   Weight: 51.4 kg (113 lb 5.1 oz)   50.9 kg (112 lb 3.4 oz)  Height:        Wt Readings from Last 3 Encounters:  11/13/17 50.9 kg (112 lb 3.4 oz)  10/09/17 48.5 kg (106 lb 14.8 oz)  07/24/17 51.8 kg (114 lb 3.2 oz)    No intake or output data in the 24 hours ending 11/13/17 1216   Physical Exam  Awake Alert, Oriented X 1, No new F.N deficits, Normal affect [place].AT,PERRAL Supple Neck,No JVD, No cervical lymphadenopathy  appriciated.  Symmetrical Chest wall movement, Good air movement bilaterally, CTAB RRR,No Gallops,Rubs or new Murmurs, No Parasternal Heave +ve B.Sounds, Abd Soft, No tenderness, No organomegaly appriciated, No rebound - guarding or rigidity. No Cyanosis, Clubbing or edema, No new Rash or bruise      Data Review:    CBC Recent Labs  Lab 11/07/17 0547 11/09/17 0601 11/10/17 0617 11/11/17 0716 11/12/17 0631 11/13/17 0621  WBC 9.1 9.4 6.0 7.1 5.9 5.6  HGB 12.0 11.9* 11.1* 10.7* 10.2* 10.5*  HCT 36.0 36.6 33.6* 32.3* 31.1* 32.5*  PLT 109* 97* 74* 72* 69* 63*  MCV 97.0 97.6 96.8 97.9 97.2 97.6  MCH 32.3 31.7 32.0 32.4 31.9 31.5  MCHC 33.3 32.5 33.0 33.1 32.8 32.3  RDW 19.3* 18.4* 17.9* 17.9* 17.4* 17.4*  LYMPHSABS 2.1 3.4 1.8 2.0 2.1  --   MONOABS 0.5 0.4 0.4 0.4 0.3  --   EOSABS 0.1 0.2 0.1 0.1 0.1  --   BASOSABS 0.0 0.0 0.0 0.0 0.0  --     Chemistries  Recent Labs  Lab 11/07/17 0547 11/09/17 0601 11/10/17 0617 11/11/17 0716 11/12/17 0631 11/13/17 0621  NA 143 143 141 140 139 140  K 3.6 2.8* 2.5* 3.6 3.4* 3.4*  CL 114* 110 107 107 104 103  CO2 20* 25 25 27 27 28   GLUCOSE 120* 129* 87 138* 77 74  BUN 15 13 13 15 12 13   CREATININE 0.46 0.53 0.43* 0.50 0.39* 0.46  CALCIUM 8.4* 8.5* 8.2* 8.5* 8.5* 8.5*  MG 1.4* 1.3* 1.5* 1.8 1.4* 1.6*  AST 37  --   --   --   --  50*  ALT 32  --   --   --   --  38  ALKPHOS 256*  --   --   --   --  181*  BILITOT 1.3*  --   --   --   --  0.9   ------------------------------------------------------------------------------------------------------------------ No results for input(s): CHOL, HDL, LDLCALC, TRIG, CHOLHDL, LDLDIRECT in the last 72 hours.  Lab Results  Component Value Date   HGBA1C 6.2 10/15/2016   ------------------------------------------------------------------------------------------------------------------ No results for input(s): TSH, T4TOTAL, T3FREE, THYROIDAB in the last 72 hours.  Invalid input(s):  FREET3 ------------------------------------------------------------------------------------------------------------------ No results for input(s): VITAMINB12, FOLATE, FERRITIN, TIBC, IRON, RETICCTPCT in the last 72 hours.  Coagulation profile Recent Labs  Lab 11/12/17 0631  INR 1.37    No results for input(s): DDIMER in the last 72 hours.  Cardiac Enzymes No results for input(s): CKMB, TROPONINI, MYOGLOBIN in the last 168 hours.  Invalid input(s): CK ------------------------------------------------------------------------------------------------------------------    Component Value Date/Time   BNP 162.3 (H) 12/05/2016 2316    Inpatient Medications  Scheduled Meds: . carvedilol  12.5 mg Oral BID WC  . cholecalciferol  2,000 Units Oral Daily  . feeding supplement (ENSURE ENLIVE)  237 mL Oral TID BM  . ferrous sulfate  325 mg Oral BID WC  . furosemide  40 mg Intravenous Daily  . levothyroxine  100 mcg Oral QAC breakfast  . mirtazapine  15 mg Oral QHS  . nystatin  5 mL Oral QID  . pantoprazole  40 mg Oral Daily  . polyethylene glycol  17 g Oral Daily  . senna-docusate  2 tablet Oral BID  . sertraline  75 mg Oral Daily   Continuous Infusions: . fluconazole (DIFLUCAN) IV Stopped (11/12/17 1625)  . levETIRAcetam Stopped (11/13/17 0927)   PRN Meds:.haloperidol lactate, ibuprofen, ondansetron (ZOFRAN) IV  Micro Results Recent Results (from the past 240 hour(s))  Urine culture     Status: Abnormal   Collection Time: 11/03/17 12:18 PM  Result Value Ref Range Status   Specimen Description   Final    URINE, CATHETERIZED Performed at [facility], [place] [address], [place], [place] [address]    Special Requests   Final    NONE Performed at [facility], [place] [address], [place], [place] [address]    Culture MULTIPLE ORGANISMS PRESENT, NONE PREDOMINANT (A)  Final   Report Status 11/04/2017 FINAL  Final  Culture, Urine     Status:  Abnormal   Collection Time: 11/06/17  9:38 PM  Result Value Ref Range Status   Specimen Description   Final    URINE, CLEAN CATCH Performed at [facility], [place] [address], [place], [place] [address]    Special Requests   Final    NONE Performed at [facility], [place] [address], [place], [place] [address]    Culture (A)  Final    <10,000 COLONIES/mL INSIGNIFICANT GROWTH Performed at [place] [address] [place],  [address]    Report Status 11/08/2017 FINAL  Final    Radiology Reports Ct Abdomen Wo Contrast  Result Date: 11/12/2017 CLINICAL DATA:  Evaluate anatomy prior to potential percutaneous gastrostomy tube placement EXAM: CT ABDOMEN WITHOUT CONTRAST TECHNIQUE: Multidetector CT imaging of the abdomen was performed following the standard protocol without IV contrast. COMPARISON:  02/03/2015; 10/26/2014 FINDINGS: Lower chest: Limited visualization of the lower thorax demonstrates interval increase in moderate to large bilateral pleural effusions with associated bibasilar atelectasis/collapse. Cardiomegaly. Lead  tips terminate within the right atrium, ventricle and coronary sinus. Calcifications of the mitral valve annulus. No pericardial effusion. Hepatobiliary: Nodular hepatic contour, unchanged. There is diffuse increased attenuation of the hepatic parenchyma. Trace amount of perihepatic ascites. The gallbladder is not visualized. There is an approximately 1.7 x 1.3 cm stone within the distal aspect of the CBD (image 27, series 5), with associated upstream dilatation of the common bile duct measuring 1.2 cm. This finding is also associated with suspected at least mild intrahepatic biliary ductal dilatation, incompletely evaluated on this noncontrast examination. Minimal amount of periportal edema suspected on this noncontrast examination. Pancreas: The pancreas is atrophic, unchanged. Spleen: Normal in size.  Splenic calcifications.  Adrenals/Urinary Tract: No renal stones. Grossly unchanged appearance of previously characterized approximately 2.9 cm left-sided renal cyst. Note is again made of exaggerated horizontal lie of the right kidney. No urinary obstruction or perinephric stranding. Normal appearance the bilateral adrenal glands. Urinary bladder is not imaged. Stomach/Bowel: The transverse colon is interposed between the anterior wall the stomach and ventral aspect of the abdomen (image 12, series 2). Nonobstructive bowel gas pattern. No pneumoperitoneum, pneumatosis or portal venous gas. Vascular/Lymphatic: Calcified atherosclerotic plaque within normal caliber abdominal aorta. There is a large amount of eccentric calcified plaque involving the caudal aspect of the proximal SMA (image 64, series 6), incompletely evaluated on this noncontrast examination though favored to results a least 50% luminal narrowing. Other: Diffuse body wall anasarca. Musculoskeletal: No acute or aggressive osseous abnormalities. Grade 1 anterolisthesis of L3 upon L4 measuring 4 mm. IMPRESSION: 1. Suboptimal anatomy to allow for percutaneous gastrostomy tube placement with the transverse colon interposed between the anterior wall of the stomach and ventral upper abdominal wall. If percutaneous gastrostomy tube placement is desired, would recommend the administration of either oral or rectal contrast prior to attempted placement. 2. Cardiomegaly with findings suggestive of pulmonary edema including moderate to large sized bilateral pleural effusions and diffuse body wall anasarca. 3. Note is made of an approximately 1.7 cm stone located within distal aspect of the CBD with associated dilatation of the extra and intrahepatic biliary system, incompletely evaluated on this noncontrast examination. Correlation with LFTs is advised. 4. Nodular hepatic contour suggestive of hepatic cirrhosis. 5. Diffuse increased attenuation of the hepatic parenchyma on this  noncontrast examination, nonspecific though could be seen in the setting of chronic amiodarone therapy. Clinical correlation is advised. Electronically Signed   By: Sandi Mariscal M.D.   On: 11/12/2017 09:02   Ct Head Wo Contrast  Result Date: 11/06/2017 CLINICAL DATA:  History of Alzheimer's dementia. Dysphagia. Altered mental status. EXAM: CT HEAD WITHOUT CONTRAST TECHNIQUE: Contiguous axial images were obtained from the base of the skull through the vertex without intravenous contrast. COMPARISON:  03/07/2015 FINDINGS: Brain: Generalized atrophy. Chronic small-vessel ischemic changes of the hemispheric white matter. 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 No sign of mass lesion, hemorrhage, hydrocephalus or extra-axial collection. Vascular: There is atherosclerotic calcification of the major vessels at the base of the brain. Skull: Negative Sinuses/Orbits: Clear/normal Other: None IMPRESSION: No acute finding by CT. Atrophy and chronic small-vessel ischemic changes. 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 Electronically Signed   By: Nelson Chimes M.D.   On: 11/06/2017 14:49   Dg Chest Port 1 View  Result Date: 11/12/2017 CLINICAL DATA:  Dyspnea EXAM: PORTABLE CHEST 1 VIEW COMPARISON:  11/09/2017; 11/07/2017; 07/23/2014 FINDINGS: Grossly unchanged cardiac silhouette and mediastinal contours post median sternotomy. Stable positioning of support apparatus. Pulmonary vasculature is indistinct with  cephalization of flow. Grossly unchanged moderate to large volume layering bilateral effusions with associated perihilar and bibasilar opacities. No pneumothorax. No acute osseus abnormalities. Vascular calcifications within the splenic artery. IMPRESSION: Similar findings of cardiomegaly, pulmonary edema and moderate to large sized bilateral pleural effusions. Electronically Signed   By: Sandi Mariscal M.D.   On: 11/12/2017 09:01   Dg Chest Port 1 View  Result Date: 11/09/2017 CLINICAL DATA:   Dyspnea.  Urinary tract infection. EXAM: PORTABLE CHEST 1 VIEW COMPARISON:  11/07/2017 FINDINGS: AICD remains in stable position. Stable cardiomegaly and central pulmonary vascular congestion. Layering bilateral pleural effusions show no significant change. Persistent opacity seen in the left retrocardiac lung base, which may be due to atelectasis or consolidation. IMPRESSION: Stable layering bilateral pleural effusions and left retrocardiac atelectasis versus consolidation. Stable cardiomegaly and pulmonary vascular congestion. Electronically Signed   By: Earle Gell M.D.   On: 11/09/2017 15:15   Dg Chest Port 1 View  Result Date: 11/07/2017 CLINICAL DATA:  Shortness of breath EXAM: PORTABLE CHEST 1 VIEW COMPARISON:  July 23, 2017 FINDINGS: There is stable cardiomegaly with pacemaker leads attached to the right atrium, right ventricle, and coronary sinus. The pulmonary vascularity with mild pulmonary venous hypertension. There are bilateral pleural effusions. There is consolidation in the left lower lobe. There is slight underlying interstitial edema. There is aortic atherosclerosis. No evident adenopathy. There is degenerative change in each shoulder with superior migration of each humeral head. IMPRESSION: Pulmonary vascular congestion with mild pulmonary edema and pleural effusions. There may well be a degree of underlying congestive heart failure. There is consolidation in the left lower lobe concerning for pneumonia in this area. It should be noted that alveolar edema may also be present in this area. Stable pacemaker lead positions. No adenopathy evident. There is aortic atherosclerosis. Aortic Atherosclerosis (ICD10-I70.0). Electronically Signed   By: Lowella Grip III M.D.   On: 11/07/2017 13:02    Time Spent in minutes  30   Jani Gravel M.D on 11/13/2017 at 12:16 PM  Between 7am to 7pm - Pager - [phone]    After 7pm go to [email or web address] - password [facility]  [facility] -  Office   [phone]

## 2017-11-13 NOTE — Progress Notes (Signed)
   11/13/17 1522  Clinical Encounter Type  Visited With Patient;Health care provider  Visit Type Initial  Spiritual Encounters  Spiritual Needs Prayer   Rounding on Palliative Patients.  Patient was in the bed resting and responded when I called her name.  Indicated her hand hurt so I alerted the nurse who responded and took care of it.  Patient did not have much to say, but when I asked if she wanted prayer, she said yes.  I asked how I might pray for her and she said to get better or for it to be over because she does not want to live like this, but wants very much to get better and hopes she can get better.  I prayed for her.  Will follow and support as needed. Chaplain Katherene Ponto

## 2017-11-13 NOTE — Progress Notes (Signed)
Daily Progress Note   Patient Name: Jean Hodges       Date: 11/09/17 DOB: 04-18-1934  Age: 84 y.o. MRN#: [id number] Attending Physician: Jani Gravel, MD Primary Care Physician: Patient, No Pcp Per Admit Date: 11/03/2017  Reason for Consultation/Follow-up: Establishing goals of care  Subjective: Patient is resting in bed with eyes closed. She moves her upper extremities/repositions herself in bed when her name is called. She does not open eyes. She does not engage. She does not verbalize. There is no family present at the bedside.     Length of Stay: 10  Current Medications: Scheduled Meds:  . carvedilol  12.5 mg Oral BID WC  . cholecalciferol  2,000 Units Oral Daily  . feeding supplement (ENSURE ENLIVE)  237 mL Oral TID BM  . ferrous sulfate  325 mg Oral BID WC  . furosemide  40 mg Intravenous Daily  . levothyroxine  100 mcg Oral QAC breakfast  . mirtazapine  15 mg Oral QHS  . nystatin  5 mL Oral QID  . pantoprazole  40 mg Oral Daily  . polyethylene glycol  17 g Oral Daily  . senna-docusate  2 tablet Oral BID  . sertraline  75 mg Oral Daily    Continuous Infusions: . fluconazole (DIFLUCAN) IV Stopped (11/12/17 1625)  . levETIRAcetam Stopped (11/13/17 0927)    PRN Meds: haloperidol lactate, ibuprofen, ondansetron (ZOFRAN) IV           Vital Signs: BP 139/80 (BP Location: Left Arm)   Pulse 80   Temp (!) 97.5 F (36.4 C) (Oral)   Resp 16   Ht 5\' 1"  (1.549 m)   Wt 50.9 kg (112 lb 3.4 oz)   SpO2 99%   BMI 21.20 kg/m  SpO2: SpO2: 99 % O2 Device: O2 Device: Room Air O2 Flow Rate:   Weak appearing elderly lady resting in bed Does not appear to be in acute distress Regular pattern of breathing S1-S2 Abdomen not distended No edema  Intake/output summary:  No intake or  output data in the 24 hours ending 11/13/17 1419 LBM: Last BM Date: 11/03/17 Baseline Weight: Weight: 48.1 kg (106 lb) Most recent weight: Weight: 50.9 kg (112 lb 3.4 oz)  Palliative Assessment/Data: PPS 20%     Patient Active Problem List   Diagnosis Date Noted  .  Adult failure to thrive   . Palliative care by specialist   . Goals of care, counseling/discussion   . Altered mental status   . Pressure injury of skin 11/04/2017  . Hyperkalemia 11/04/2017  . UTI (urinary tract infection) 11/03/2017  . Protein-calorie malnutrition ([place]) 10/10/2017  . Anemia 07/23/2017  . Symptomatic anemia 11/27/2016  . Tonic-clonic seizure disorder ([place]) 10/09/2016  . Dysphagia 10/09/2016  . Moderate protein-calorie malnutrition ([place]) 10/09/2016  . Hypothyroidism (acquired) 10/09/2016  . Type 2 diabetes mellitus with peripheral vascular disease ([place]) 10/09/2016  . PVD (peripheral vascular disease) ([place]) 10/09/2016  . Essential hypertension 10/09/2016  . Alzheimer's dementia 10/09/2016  . Chronic depression 10/09/2016  . Hyperlipidemia 10/09/2016  . Coronary artery disease involving native coronary artery of native heart without angina pectoris 10/09/2016    Palliative Care Assessment & Plan   Patient Profile: 84 y.o. female  with past medical history of Alzheimer's dementia, seizures, anemia, systolic CHF, cardiomyopathy, pacemaker, CAD, HTN, HLD, DM, PVD admitted on 11/03/2017 with altered mental status and poor oral intake. In ED, patient with hyperkalemia, Hgb 8, and FOBT positive. Found to have UTI and receiving IV rocephin. Hgb dropped to 7.1 on 4/23 and patient receiving 2 units PRBC. Underlying dementia. Dietician following for protein calorie malnutrition. PT/SLP evaluations limited by patient fatigue and lethargy. Palliative medicine consultation for goals of care.   Assessment: Acute encephalopathy Alzheimer's dementia UTI Hyperkalemia Moderate protein-calorie  malnutrition Anemia  Recommendations/Plan:  Remains full code, full scope for now.  Call placed, unable to reach son Delfino Lovett, his mailbox is full, no provision for leaving message.   High concern for poor nutritional status, chronic in nature, with concern for ongoing global decline.    Goals of Care and Additional Recommendations:  Limitations on Scope of Treatment: Full Scope Treatment  Code Status: FULL   Code Status Orders  (From admission, onward)        Start     Ordered   11/03/17 1648  Full code  Continuous     11/03/17 1648    Code Status History    Date Active Date Inactive Code Status Order ID Comments User Context   10/09/2017 2031 10/10/2017 1831 Full Code [id number]  Norval Morton, MD ED   07/23/2017 1509 07/24/2017 1809 Full Code [id number]  Phillips Grout, MD Inpatient   11/27/2016 2203 11/28/2016 2154 Full Code [id number]  Rise Patience, MD Inpatient       Prognosis:   Unable to determine guarded with declining functional/cognitive/nutritional status secondary to underlying Alzheimer's dementia. High risk for re-hospitalization.   Discharge Planning:  To Be Determined  Care plan was discussed with    Thank you for allowing the Palliative Medicine Team to assist in the care of this patient.   Time In: 1300 Time Out: 1325 Total Time 25 Prolonged Time Billed no      Greater than 50%  of this time was spent counseling and coordinating care related to the above assessment and plan.   Loistine Chance MD [facility]  [phone]  Phone: [phone] Fax: [phone]  Please contact Palliative Medicine Team phone at [phone] for questions and concerns.

## 2017-11-14 LAB — CBC
HEMATOCRIT: 32.8 % — AB (ref 36.0–46.0)
Hemoglobin: 10.7 g/dL — ABNORMAL LOW (ref 12.0–15.0)
MCH: 31.8 pg (ref 26.0–34.0)
MCHC: 32.6 g/dL (ref 30.0–36.0)
MCV: 97.6 fL (ref 78.0–100.0)
PLATELETS: 78 10*3/uL — AB (ref 150–400)
RBC: 3.36 MIL/uL — ABNORMAL LOW (ref 3.87–5.11)
RDW: 17.2 % — AB (ref 11.5–15.5)
WBC: 5.9 10*3/uL (ref 4.0–10.5)

## 2017-11-14 LAB — COMPREHENSIVE METABOLIC PANEL
ALT: 43 U/L (ref 14–54)
AST: 61 U/L — AB (ref 15–41)
Albumin: 2.2 g/dL — ABNORMAL LOW (ref 3.5–5.0)
Alkaline Phosphatase: 175 U/L — ABNORMAL HIGH (ref 38–126)
Anion gap: 10 (ref 5–15)
BILIRUBIN TOTAL: 0.7 mg/dL (ref 0.3–1.2)
BUN: 14 mg/dL (ref 6–20)
CHLORIDE: 103 mmol/L (ref 101–111)
CO2: 27 mmol/L (ref 22–32)
Calcium: 8.5 mg/dL — ABNORMAL LOW (ref 8.9–10.3)
Creatinine, Ser: 0.56 mg/dL (ref 0.44–1.00)
Glucose, Bld: 103 mg/dL — ABNORMAL HIGH (ref 65–99)
POTASSIUM: 3.4 mmol/L — AB (ref 3.5–5.1)
Sodium: 140 mmol/L (ref 135–145)
TOTAL PROTEIN: 5 g/dL — AB (ref 6.5–8.1)

## 2017-11-14 LAB — GLUCOSE, CAPILLARY
GLUCOSE-CAPILLARY: 104 mg/dL — AB (ref 65–99)
Glucose-Capillary: 63 mg/dL — ABNORMAL LOW (ref 65–99)
Glucose-Capillary: 86 mg/dL (ref 65–99)

## 2017-11-14 LAB — MAGNESIUM: Magnesium: 1.6 mg/dL — ABNORMAL LOW (ref 1.7–2.4)

## 2017-11-14 MED ORDER — DEXTROSE 50 % IV SOLN
INTRAVENOUS | Status: AC
  Administered 2017-11-14: 50 mL
  Filled 2017-11-14: qty 50

## 2017-11-14 MED ORDER — FUROSEMIDE 20 MG PO TABS: 20.0000 mg | ORAL_TABLET | Freq: Every day | ORAL | 11 refills | Status: DC

## 2017-11-14 MED ORDER — NYSTATIN 100000 UNIT/ML MT SUSP: 5.0000 mL | Freq: Four times a day (QID) | OROMUCOSAL | 0 refills | Status: DC

## 2017-11-14 NOTE — Progress Notes (Signed)
Called report to [facility]. Gave report to RN

## 2017-11-14 NOTE — Care Management Note (Signed)
Case Management Note  Patient Details  Name: Jean Hodges MRN: [id number] Date of Birth: February 22, 1934  Subjective/Objective:                  ams  Action/Plan: Date: Nov 14, 2017 Velva Harman, BSN, [place], [place] Chart and notes review for patient progress and needs. Will follow for case management and discharge needs./palliative care following for goc No cm or discharge needs present at time of this review. Next review date: [id number] Expected Discharge Date:  (unknown)               Expected Discharge Plan:  Home/Self Care  In-House Referral:     Discharge planning Services  CM Consult  Post Acute Care Choice:    Choice offered to:     DME Arranged:    DME Agency:     HH Arranged:    HH Agency:     Status of Service:  In process, will continue to follow  If discussed at Long Length of Stay Meetings, dates discussed:    Additional Comments:  Leeroy Cha, RN 11/14/2017, 10:23 AM

## 2017-11-14 NOTE — Discharge Summary (Signed)
Jean Hodges, is a 84 y.o. female  DOB 01-19-1934  MRN [id number].  Admission date:  11/03/2017  Admitting Physician  Kayleen Memos, DO  Discharge Date:  11/14/2017   Primary MD  Patient, No Pcp Per  Recommendations for primary care physician for things to follow:     Dysphagia/ Poor po intake No PEG=> IR thought too risky for this procedure due to overlying bowel in front of stomach on CT scan  If having difficulty with caloric intake then he will ask for GI consultation for PEG while at SNF    Altered mental status Acute metabolic encephalopathy -In a patient with dementia.  -Patient has waxing and waning mental status.  -baseline appears to be sleeping most of the day CT scan of the brain 4/25  was negative for acute abnormality. EEG 4/26  was negative for epileptiform activity.   NoMRI of the brain could not be done because of pacemaker - Monitor mental status. Fall precautions -Diet dysphagia 1 please   Recurrent UTI -Treated with IV Rocephin  4/22-4/27/2019.  -Urine culture 4/22 => showed multiple organisms.  -Repeat Urine culture from 11/06/2017 => showed insignificant growth.   Acute on chronic systolic heart failure Daily weights. -2D echo showed EF of 20 to 25% with moderate- severe mitral and tricuspid regurgitation. -Outpatient follow-up with cardiology Respiratory status stable  Hypokalemia resolved Please check cmp in 3 days  Hypomagnesemia resolved Please check magnesium in 3 days  Severe protein calorie malnutrition (alb 2.2) -Because of patient's mental status, her oral intake is very poor.  Please at SNF have a person sit and feed her for a full 1 hour at each meal and try to give her snack between meals please get dietician to evaluate her at SNF  Alzheimer's dementia without behavioral disturbances -Monitor mental status.  -Overall prognosis is very  poor because of mental status and very poor oral intake. Palliative care following. As per the family meeting recentlywith palliative care team, patient remains full code and family is pursuing full scope of treatment including artificial feeding tube if needed. Her son is going to see how she does at SNF with caloric intake, I recommended outpatient GI consult if not able to maintain weight to see if they would pursue PEG.  IR is not interested in PEG   Anemia/ Thrombocytopenia Check cbc in 3 days  Diabetes mellitus type 2 with peripheral vascular disease -Continue Accu-Cheks. Blood sugars stable  Stage I pressure injury over her coccyx -Continue skin care   Thrush Nystatin 65mL qid x 1 week      Admission Diagnosis  Altered mental status, unspecified altered mental status type [R41.82]   Discharge Diagnosis  Altered mental status, unspecified altered mental status type [R41.82]  *  Active Problems:   Dysphagia   Moderate protein-calorie malnutrition (HCC)   Type 2 diabetes mellitus with peripheral vascular disease (HCC)   Alzheimer's dementia   Protein-calorie malnutrition (HCC)   UTI (urinary tract infection)   Pressure injury  of skin   Hyperkalemia   Palliative care by specialist   Goals of care, counseling/discussion   Altered mental status   Adult failure to thrive      Past Medical History:  Diagnosis Date  . Acute encephalopathy   . Acute encephalopathy 09/30/2016  . Alzheimer's dementia without behavioral disturbance   . Alzheimer's dementia without behavioral disturbance 10/07/2016  . Anemia   . Anemia 01/28/2014  . Atherosclerotic PVD with ulceration ([place])   . CAD (coronary artery disease)   . CAD (coronary artery disease) 01/04/2014  . Cellulitis   . Cellulitis of left leg   . Diabetes mellitus without complication ([place])   . DM (diabetes mellitus) ([place]) 01/27/2014  . HTN (hypertension) 01/04/2014  . Hyperlipidemia   . Hypertension   .  Hypoglycemia associated with type 2 diabetes mellitus ([place])   . Macrocytic anemia   . Seizures ([place]) 09/30/2016  . Tear of left rotator cuff   . UTI (urinary tract infection)   . UTI (urinary tract infection) 09/25/2016    Past Surgical History:  Procedure Laterality Date  . PACEMAKER IMPLANT         HPI  from the history and physical done on the day of admission:     84 y.o.femalewith medical history significant forperipheral vascular disease, cardiomyopathy, chronicatrial fibrillation, chronic systolic heart failure, oropharyngeal dysphagia, who presented to [facility] ED from SNF with altered mental status. History is obtained from her son,ED physician,and medical records due to the patient's confusion in the setting of advanced dementia. She has been more somnolent with poor oral intake.  ED Course:On presentation tothe ED, lab studies remarkable for hyperkalemia, drop in hemoglobin. FOBT positive.       Hospital Course:     Pt was admitted for evaluation of possible UTI, pt was started on rocephin iv.  Palliative care was consulted due to her poor physical status. Pt had mild drop in Hgb 4/23 and was transfused with 2 unit prbc.  Pt wasn't felt appropriate with EGD due to her advanced dementia and FOBT negative. Pt had some alertered mental status due to UTI / dementia. CXR 4/26 showed some concern for consolidation in the left lower lung, ? Chf.  Cardiac 2D echo showed EF 20-25%, with mod- severe MR.  CT brain 4/25 negative for acute process.   CT brain 4/25 negative for acute process.  EEG 4/26 negative for seizure activity.  Palliate care has seen the patient and tried to collaborate on end of life issues.  Son was interested in PEG due to poor po intake and IR was consulted.  IR thought that the procedure was too risky due to overlying bowel and due to her comorbidities.  Pt son thinks that if there is someone watching her longer at SNF that her po intake might pick up.  Hgb  has been stable and is 10.7.  She has mild thrombocytopenia with Plt 78,  Pt has been stable clinically and will be discharge to SNF.       Follow UP  Contact information for after-discharge care    Destination    [name] Follow up in 1 day(s).   Service:  Skilled Nursing Contact information: [place] [phone]               Consults obtained - Palliative care, Interventional radiology  Discharge Condition: stable  Diet and Activity recommendation: See Discharge Instructions below  Discharge Instructions  Discharge Medications     Allergies as of 11/14/2017      Reactions   Acetaminophen    Codeine Nausea Only   Dilaudid [hydromorphone]    Hydrocodone    Hallucinations   Keflex [cephalexin] Diarrhea      Medication List    STOP taking these medications   mupirocin ointment 2 % Commonly known as:  BACTROBAN     TAKE these medications   carvedilol 12.5 MG tablet Commonly known as:  COREG Take 12.5 mg by mouth 2 (two) times daily with a meal. Hold for SBP<110 and/or pulse <60/min   DECUBI-VITE PO Take 1 capsule by mouth daily.   feeding supplement (ENSURE ENLIVE) Liqd Take 237 mLs by mouth 3 (three) times daily between meals.   ferrous sulfate 325 (65 FE) MG tablet Take 325 mg by mouth 2 (two) times daily with a meal.   furosemide 20 MG tablet Commonly known as:  LASIX Take 1 tablet (20 mg total) by mouth daily.   levETIRAcetam 500 MG tablet Commonly known as:  KEPPRA Take 500 mg by mouth 2 (two) times daily.   levothyroxine 100 MCG tablet Commonly known as:  SYNTHROID, LEVOTHROID Take 100 mcg by mouth daily before breakfast.   mirtazapine 15 MG tablet Commonly known as:  REMERON Take 15 mg by mouth at bedtime.   nystatin 100000 UNIT/ML suspension Commonly known as:  MYCOSTATIN Take 5 mLs (500,000 Units total) by mouth 4 (four) times daily.   ondansetron 4 MG tablet Commonly  known as:  ZOFRAN Take 4 mg by mouth every 6 (six) hours as needed for nausea or vomiting.   pantoprazole 40 MG tablet Commonly known as:  PROTONIX Take 1 tablet (40 mg total) by mouth daily.   polyethylene glycol packet Commonly known as:  MIRALAX / GLYCOLAX Take 17 g by mouth daily.   sennosides-docusate sodium 8.6-50 MG tablet Commonly known as:  SENOKOT-S Take 2 tablets by mouth 2 (two) times daily.   sertraline 50 MG tablet Commonly known as:  ZOLOFT Take 75 mg by mouth daily.   Vitamin D3 2000 units Tabs Take 2,000 Units by mouth daily.       Major procedures and Radiology Reports - PLEASE review detailed and final reports for all details, in brief -       Ct Abdomen Wo Contrast  Result Date: 11/12/2017 CLINICAL DATA:  Evaluate anatomy prior to potential percutaneous gastrostomy tube placement EXAM: CT ABDOMEN WITHOUT CONTRAST TECHNIQUE: Multidetector CT imaging of the abdomen was performed following the standard protocol without IV contrast. COMPARISON:  02/03/2015; 10/26/2014 FINDINGS: Lower chest: Limited visualization of the lower thorax demonstrates interval increase in moderate to large bilateral pleural effusions with associated bibasilar atelectasis/collapse. Cardiomegaly. Lead tips terminate within the right atrium, ventricle and coronary sinus. Calcifications of the mitral valve annulus. No pericardial effusion. Hepatobiliary: Nodular hepatic contour, unchanged. There is diffuse increased attenuation of the hepatic parenchyma. Trace amount of perihepatic ascites. The gallbladder is not visualized. There is an approximately 1.7 x 1.3 cm stone within the distal aspect of the CBD (image 27, series 5), with associated upstream dilatation of the common bile duct measuring 1.2 cm. This finding is also associated with suspected at least mild intrahepatic biliary ductal dilatation, incompletely evaluated on this noncontrast examination. Minimal amount of periportal edema  suspected on this noncontrast examination. Pancreas: The pancreas is atrophic, unchanged. Spleen: Normal in size.  Splenic calcifications. Adrenals/Urinary Tract: No renal stones. Grossly unchanged appearance of previously characterized approximately  2.9 cm left-sided renal cyst. Note is again made of exaggerated horizontal lie of the right kidney. No urinary obstruction or perinephric stranding. Normal appearance the bilateral adrenal glands. Urinary bladder is not imaged. Stomach/Bowel: The transverse colon is interposed between the anterior wall the stomach and ventral aspect of the abdomen (image 12, series 2). Nonobstructive bowel gas pattern. No pneumoperitoneum, pneumatosis or portal venous gas. Vascular/Lymphatic: Calcified atherosclerotic plaque within normal caliber abdominal aorta. There is a large amount of eccentric calcified plaque involving the caudal aspect of the proximal SMA (image 64, series 6), incompletely evaluated on this noncontrast examination though favored to results a least 50% luminal narrowing. Other: Diffuse body wall anasarca. Musculoskeletal: No acute or aggressive osseous abnormalities. Grade 1 anterolisthesis of L3 upon L4 measuring 4 mm. IMPRESSION: 1. Suboptimal anatomy to allow for percutaneous gastrostomy tube placement with the transverse colon interposed between the anterior wall of the stomach and ventral upper abdominal wall. If percutaneous gastrostomy tube placement is desired, would recommend the administration of either oral or rectal contrast prior to attempted placement. 2. Cardiomegaly with findings suggestive of pulmonary edema including moderate to large sized bilateral pleural effusions and diffuse body wall anasarca. 3. Note is made of an approximately 1.7 cm stone located within distal aspect of the CBD with associated dilatation of the extra and intrahepatic biliary system, incompletely evaluated on this noncontrast examination. Correlation with LFTs is  advised. 4. Nodular hepatic contour suggestive of hepatic cirrhosis. 5. Diffuse increased attenuation of the hepatic parenchyma on this noncontrast examination, nonspecific though could be seen in the setting of chronic amiodarone therapy. Clinical correlation is advised. Electronically Signed   By: Sandi Mariscal M.D.   On: 11/12/2017 09:02   Ct Head Wo Contrast  Result Date: 11/06/2017 CLINICAL DATA:  History of Alzheimer's dementia. Dysphagia. Altered mental status. EXAM: CT HEAD WITHOUT CONTRAST TECHNIQUE: Contiguous axial images were obtained from the base of the skull through the vertex without intravenous contrast. COMPARISON:  03/07/2015 FINDINGS: Brain: Generalized atrophy. Chronic small-vessel ischemic changes of the hemispheric white matter. 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 No sign of mass lesion, hemorrhage, hydrocephalus or extra-axial collection. Vascular: There is atherosclerotic calcification of the major vessels at the base of the brain. Skull: Negative Sinuses/Orbits: Clear/normal Other: None IMPRESSION: No acute finding by CT. Atrophy and chronic small-vessel ischemic changes. 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 Electronically Signed   By: Nelson Chimes M.D.   On: 11/06/2017 14:49   Dg Chest Port 1 View  Result Date: 11/12/2017 CLINICAL DATA:  Dyspnea EXAM: PORTABLE CHEST 1 VIEW COMPARISON:  11/09/2017; 11/07/2017; 07/23/2014 FINDINGS: Grossly unchanged cardiac silhouette and mediastinal contours post median sternotomy. Stable positioning of support apparatus. Pulmonary vasculature is indistinct with cephalization of flow. Grossly unchanged moderate to large volume layering bilateral effusions with associated perihilar and bibasilar opacities. No pneumothorax. No acute osseus abnormalities. Vascular calcifications within the splenic artery. IMPRESSION: Similar findings of cardiomegaly, pulmonary edema and moderate to large sized bilateral pleural effusions.  Electronically Signed   By: Sandi Mariscal M.D.   On: 11/12/2017 09:01   Dg Chest Port 1 View  Result Date: 11/09/2017 CLINICAL DATA:  Dyspnea.  Urinary tract infection. EXAM: PORTABLE CHEST 1 VIEW COMPARISON:  11/07/2017 FINDINGS: AICD remains in stable position. Stable cardiomegaly and central pulmonary vascular congestion. Layering bilateral pleural effusions show no significant change. Persistent opacity seen in the left retrocardiac lung base, which may be due to atelectasis or consolidation. IMPRESSION: Stable layering bilateral pleural effusions  and left retrocardiac atelectasis versus consolidation. Stable cardiomegaly and pulmonary vascular congestion. Electronically Signed   By: Earle Gell M.D.   On: 11/09/2017 15:15   Dg Chest Port 1 View  Result Date: 11/07/2017 CLINICAL DATA:  Shortness of breath EXAM: PORTABLE CHEST 1 VIEW COMPARISON:  July 23, 2017 FINDINGS: There is stable cardiomegaly with pacemaker leads attached to the right atrium, right ventricle, and coronary sinus. The pulmonary vascularity with mild pulmonary venous hypertension. There are bilateral pleural effusions. There is consolidation in the left lower lobe. There is slight underlying interstitial edema. There is aortic atherosclerosis. No evident adenopathy. There is degenerative change in each shoulder with superior migration of each humeral head. IMPRESSION: Pulmonary vascular congestion with mild pulmonary edema and pleural effusions. There may well be a degree of underlying congestive heart failure. There is consolidation in the left lower lobe concerning for pneumonia in this area. It should be noted that alveolar edema may also be present in this area. Stable pacemaker lead positions. No adenopathy evident. There is aortic atherosclerosis. Aortic Atherosclerosis (ICD10-I70.0). Electronically Signed   By: Lowella Grip III M.D.   On: 11/07/2017 13:02    Micro Results      Recent Results (from the past 240  hour(s))  Culture, Urine     Status: Abnormal   Collection Time: 11/06/17  9:38 PM  Result Value Ref Range Status   Specimen Description   Final    URINE, CLEAN CATCH Performed at [facility], [place] [address] [place], [place] [address]    Special Requests   Final    NONE Performed at [facility], [place] [address], [place], [place] [address]    Culture (A)  Final    <10,000 COLONIES/mL INSIGNIFICANT GROWTH Performed at [facility] [address] [place], [place] [address]    Report Status 11/08/2017 FINAL  Final       Today   Subjective    Howard Patton today appears to be at her baseline, no distress.    No obvious  Headache, chest abdominal pain, cough, sob, no new weakness tingling or numbness.  Pt is at baseline confused.  Discussed with her son yesterday, d/c to SNF today.     Objective   Blood pressure (!) 150/59, pulse 79, temperature 98.2 F (36.8 C), temperature source Oral, resp. rate 17, height 5\' 1"  (1.549 m), weight 50.9 kg (112 lb 3.4 oz), SpO2 93 %.   Intake/Output Summary (Last 24 hours) at 11/14/2017 1416 Last data filed at 11/14/2017 0600 Gross per 24 hour  Intake 550 ml  Output -  Net 550 ml    Exam Awake Alert, Oriented x 0, No new F.N deficits, Normal affect [place].AT,PERRAL Supple Neck,No JVD, No cervical lymphadenopathy appriciated.  Symmetrical Chest wall movement, Good air movement bilaterally, CTAB RRR, s1, s2, 2/6 sem apex +ve B.Sounds, Abd Soft, Non tender, No organomegaly appriciated, No rebound -guarding or rigidity. No Cyanosis, Clubbing or edema,  + stage 1 sacral decub    Data Review   CBC w Diff:  Lab Results  Component Value Date   WBC 5.9 11/14/2017   HGB 10.7 (L) 11/14/2017   HCT 32.8 (L) 11/14/2017   PLT 78 (L) 11/14/2017   LYMPHOPCT 35 11/12/2017   MONOPCT 5 11/12/2017   EOSPCT 1 11/12/2017   BASOPCT 0 11/12/2017    CMP:  Lab Results  Component Value Date   NA 140  11/14/2017   NA 143 10/15/2016  K 3.4 (L) 11/14/2017   CL 103 11/14/2017   CO2 27 11/14/2017   BUN 14 11/14/2017   BUN 15 10/15/2016   CREATININE 0.56 11/14/2017   GLU 65 10/15/2016   PROT 5.0 (L) 11/14/2017   ALBUMIN 2.2 (L) 11/14/2017   BILITOT 0.7 11/14/2017   ALKPHOS 175 (H) 11/14/2017   AST 61 (H) 11/14/2017   ALT 43 11/14/2017  .   Total Time in preparing paper work, data evaluation and todays exam - 42 minutes  Jani Gravel M.D on 11/14/2017 at 2:16 PM  [facility]   Office  [phone]

## 2017-11-14 NOTE — Progress Notes (Signed)
Patient will return to [facility].  LCSW confirmed return with facility.   Patient will transport by [facility].   LCSW faxed dc docs to facility.   RN report #: Sibley, Shawna Clamp [place] [id number]

## 2017-11-28 ENCOUNTER — Non-Acute Institutional Stay: Payer: Medicare Other | Admitting: Internal Medicine

## 2017-11-28 ENCOUNTER — Encounter: Payer: Self-pay | Admitting: Internal Medicine

## 2017-11-28 DIAGNOSIS — R413 Other amnesia: Secondary | ICD-10-CM

## 2017-11-28 DIAGNOSIS — Z515 Encounter for palliative care: Secondary | ICD-10-CM

## 2017-11-28 DIAGNOSIS — E43 Unspecified severe protein-calorie malnutrition: Secondary | ICD-10-CM

## 2017-11-28 NOTE — Progress Notes (Signed)
     PATIENT NAME: Lala Been DOB: [id number]    DUPLICATE:  ERRONEOUS NOTE  Gonzella Lex, NP-C

## 2017-12-01 ENCOUNTER — Non-Acute Institutional Stay: Payer: Medicare Other | Admitting: Internal Medicine

## 2017-12-01 DIAGNOSIS — R413 Other amnesia: Secondary | ICD-10-CM

## 2017-12-01 DIAGNOSIS — E43 Unspecified severe protein-calorie malnutrition: Secondary | ICD-10-CM

## 2017-12-01 DIAGNOSIS — Z515 Encounter for palliative care: Secondary | ICD-10-CM

## 2017-12-01 DIAGNOSIS — R233 Spontaneous ecchymoses: Secondary | ICD-10-CM | POA: Insufficient documentation

## 2017-12-01 NOTE — Progress Notes (Signed)
PALLIATIVE CARE CONSULT VISIT   PATIENT NAME: Jean Hodges DOB: 1933-11-08 MRN: [id number]     REFERRING PROVIDER:      Dr. Theodis Blaze, PA  RESPONSIBLE PARTY:   Taelor Waymire)  [phone]   RECOMMENDATIONS and PLAN:  1.Severe protein-calorie malnutrition:  Continues to decline along with weight loss since Feb. (5 lbs). Feedings by staff.  Current weight is 99lbs. Hospice appropriate.  2. Memory Loss R41.3:  FAST 7c.  Cognitive abilities vary.  Unable to express needs today.   Requiring additional assistance.    3. Palliative care encounter  Z51.5:  Lengthy discussion with son via phone related to current additional pt cognitive and functional decline.   Today's Hgb result of 9.1 reviewed as well.  Pt. Is appropriate for Hospice services.  Son desires full treatment plan for pt now and in the event of a life changing emergency.  His goal is to have pt evaluated by GI for possible PEG tube placement.  Phone conversation with daughter who states that "she does not want her mother to suffer and has been aware of her decline for some time".  Encouraged discussion between siblings and I will follow-up with them next week.  I spent 45 minutes providing this consultation at [facility],  from 12:45pm to 1:30pm. More than 50% of the time in this consultation was spent coordinating communication with PA Aldona Bar, Dr. Aubery Lapping, son and daughter via phone.  HISTORY OF PRESENT ILLNESS: Follow-up with Jean Hodges finds that she continues to have cognitive and functional decline, less nutritional intake and ability to express her needs.  She has been hospitalized from 4/22-11/14/17 due to altered mental status, UTI, CHF with EF of 20-25%, failure to thrive.  There has been no improvement since return to snf.  CODE STATUS: FULL CODE  PPS: 20% HOSPICE ELIGIBILITY/DIAGNOSIS: TBD  PAST MEDICAL HISTORY:  Past Medical History:  Diagnosis Date  . Acute encephalopathy   . Acute  encephalopathy 09/30/2016  . Alzheimer's dementia without behavioral disturbance   . Alzheimer's dementia without behavioral disturbance 10/07/2016  . Anemia   . Anemia 01/28/2014  . Atherosclerotic PVD with ulceration ([place])   . CAD (coronary artery disease)   . CAD (coronary artery disease) 01/04/2014  . Cellulitis   . Cellulitis of left leg   . Diabetes mellitus without complication ([place])   . DM (diabetes mellitus) ([place]) 01/27/2014  . HTN (hypertension) 01/04/2014  . Hyperlipidemia   . Hypertension   . Hypoglycemia associated with type 2 diabetes mellitus ([place])   . Macrocytic anemia   . Seizures ([place]) 09/30/2016  . Tear of left rotator cuff   . UTI (urinary tract infection)   . UTI (urinary tract infection) 09/25/2016    SOCIAL HX:  Social History   Tobacco Use  . Smoking status: Never Smoker  . Smokeless tobacco: Never Used  Substance Use Topics  . Alcohol use: No    ALLERGIES:  Allergies  Allergen Reactions  . Acetaminophen   . Codeine Nausea Only  . Dilaudid [Hydromorphone]   . Hydrocodone     Hallucinations  . Keflex [Cephalexin] Diarrhea     PERTINENT MEDICATIONS:  Outpatient Encounter Medications as of 11/28/2017  Medication Sig  . carvedilol (COREG) 12.5 MG tablet Take 12.5 mg by mouth 2 (two) times daily with a meal. Hold for SBP<110 and/or pulse <60/min  . Cholecalciferol (VITAMIN D3) 2000 units TABS Take 2,000 Units by mouth daily.  . feeding supplement, ENSURE ENLIVE, (ENSURE ENLIVE) LIQD  Take 237 mLs by mouth 3 (three) times daily between meals. (Patient not taking: Reported on 11/03/2017)  . ferrous sulfate 325 (65 FE) MG tablet Take 325 mg by mouth 2 (two) times daily with a meal.  . furosemide (LASIX) 20 MG tablet Take 1 tablet (20 mg total) by mouth daily.  Marland Kitchen levETIRAcetam (KEPPRA) 500 MG tablet Take 500 mg by mouth 2 (two) times daily.  Marland Kitchen levothyroxine (SYNTHROID, LEVOTHROID) 100 MCG tablet Take 100 mcg by mouth daily before breakfast.  .  mirtazapine (REMERON) 15 MG tablet Take 15 mg by mouth at bedtime.  . Multiple Vitamins-Minerals (DECUBI-VITE PO) Take 1 capsule by mouth daily.  Marland Kitchen nystatin (MYCOSTATIN) 100000 UNIT/ML suspension Take 5 mLs (500,000 Units total) by mouth 4 (four) times daily.  . ondansetron (ZOFRAN) 4 MG tablet Take 4 mg by mouth every 6 (six) hours as needed for nausea or vomiting.  . pantoprazole (PROTONIX) 40 MG tablet Take 1 tablet (40 mg total) by mouth daily.  . polyethylene glycol (MIRALAX / GLYCOLAX) packet Take 17 g by mouth daily.  . sennosides-docusate sodium (SENOKOT-S) 8.6-50 MG tablet Take 2 tablets by mouth 2 (two) times daily.  . sertraline (ZOLOFT) 50 MG tablet Take 75 mg by mouth daily.   No facility-administered encounter medications on file as of 11/28/2017.     PHYSICAL EXAM:   General: Very frail and fragile appearing. Cachetic.  In NAD but grimacing Cardiovascular: regular rate and rhythm Pulmonary: clear ant fields Abdomen: soft, nontender, + bowel sounds GU: Incontinent Extremities: R AKA.  No edema Skin:: Reports of pressure injury of sacrum and stump.  Not visualized due to bandages Neurological: Alert, unable to determine orientation due to dementia, speaks in 2-3 words  Gonzella Lex, NP-C

## 2017-12-02 DIAGNOSIS — R413 Other amnesia: Secondary | ICD-10-CM | POA: Insufficient documentation

## 2017-12-05 NOTE — Progress Notes (Signed)
PALLIATIVE CARE CONSULT VISIT   PATIENT NAME: Jean Hodges DOB: 1933-11-25 MRN: [id number]     REFERRING PROVIDER:      Dr. Theodis Blaze, PA  RESPONSIBLE PARTY:   Kiri Hinderliter)  [phone]   RECOMMENDATIONS and PLAN:  1.Severe protein-calorie malnutrition:  No improvement. Increased weakness. Continue feedings and hydration.  Hospice appropriate.  2. Memory Loss R41.3:  FAST 7c.  Cognitive abilities vary.  Requiring additional assistance.  Supportive care  3. Palliative care encounter  Z51.5:  At this time, patient remains full scope of treatment.  Pending repeat CBC to assess anemia. Plan on further discussion with son and daughter related to ACP.  I spent 30 minutes providing this consultation at [facility],  from 3:00pm to 3:30pm. More than 50% of the time in this consultation was spent coordinating communication with [place] and facility staff.  HISTORY OF PRESENT ILLNESS: Follow-up with [name] who remains in a poor state of health.  She has increased daytime sleeping and limited nutritional intake.  No falls since last visit.  CODE STATUS: FULL CODE  PPS: 20%  HOSPICE ELIGIBILITY/DIAGNOSIS: YES:  Severe protein calorie malnutrition, advanced dementia.  Declined by family  PAST MEDICAL HISTORY:  Past Medical History:  Diagnosis Date  . Acute encephalopathy   . Acute encephalopathy 09/30/2016  . Alzheimer's dementia without behavioral disturbance   . Alzheimer's dementia without behavioral disturbance 10/07/2016  . Anemia   . Anemia 01/28/2014  . Atherosclerotic PVD with ulceration ([place])   . CAD (coronary artery disease)   . CAD (coronary artery disease) 01/04/2014  . Cellulitis   . Cellulitis of left leg   . Diabetes mellitus without complication ([place])   . DM (diabetes mellitus) ([place]) 01/27/2014  . HTN (hypertension) 01/04/2014  . Hyperlipidemia   . Hypertension   . Hypoglycemia associated with type 2 diabetes mellitus ([place])    . Macrocytic anemia   . Seizures ([place]) 09/30/2016  . Tear of left rotator cuff   . UTI (urinary tract infection)   . UTI (urinary tract infection) 09/25/2016    SOCIAL HX:  Social History   Tobacco Use  . Smoking status: Never Smoker  . Smokeless tobacco: Never Used  Substance Use Topics  . Alcohol use: No    ALLERGIES:  Allergies  Allergen Reactions  . Acetaminophen   . Codeine Nausea Only  . Dilaudid [Hydromorphone]   . Hydrocodone     Hallucinations  . Keflex [Cephalexin] Diarrhea     PERTINENT MEDICATIONS:  Outpatient Encounter Medications as of 12/01/2017  Medication Sig  . carvedilol (COREG) 12.5 MG tablet Take 12.5 mg by mouth 2 (two) times daily with a meal. Hold for SBP<110 and/or pulse <60/min  . Cholecalciferol (VITAMIN D3) 2000 units TABS Take 2,000 Units by mouth daily.  . feeding supplement, ENSURE ENLIVE, (ENSURE ENLIVE) LIQD Take 237 mLs by mouth 3 (three) times daily between meals. (Patient not taking: Reported on 11/03/2017)  . ferrous sulfate 325 (65 FE) MG tablet Take 325 mg by mouth 2 (two) times daily with a meal.  . furosemide (LASIX) 20 MG tablet Take 1 tablet (20 mg total) by mouth daily.  Marland Kitchen levETIRAcetam (KEPPRA) 500 MG tablet Take 500 mg by mouth 2 (two) times daily.  Marland Kitchen levothyroxine (SYNTHROID, LEVOTHROID) 100 MCG tablet Take 100 mcg by mouth daily before breakfast.  . mirtazapine (REMERON) 15 MG tablet Take 15 mg by mouth at bedtime.  . Multiple Vitamins-Minerals (DECUBI-VITE PO) Take 1 capsule by mouth  daily.  . nystatin (MYCOSTATIN) 100000 UNIT/ML suspension Take 5 mLs (500,000 Units total) by mouth 4 (four) times daily.  . ondansetron (ZOFRAN) 4 MG tablet Take 4 mg by mouth every 6 (six) hours as needed for nausea or vomiting.  . pantoprazole (PROTONIX) 40 MG tablet Take 1 tablet (40 mg total) by mouth daily.  . polyethylene glycol (MIRALAX / GLYCOLAX) packet Take 17 g by mouth daily.  . sennosides-docusate sodium (SENOKOT-S) 8.6-50 MG tablet  Take 2 tablets by mouth 2 (two) times daily.  . sertraline (ZOLOFT) 50 MG tablet Take 75 mg by mouth daily.   No facility-administered encounter medications on file as of 12/01/2017.     PHYSICAL EXAM:   General: Frail and fragile appearing elderly female resting in bed.  Cachectic.  In NAD Cardiovascular: regular rate and rhythm Pulmonary: clear ant fields Abdomen: soft, nontender, + bowel sounds Extremities: R AKA.  No ecchymosis or edema Neurological: Somnolent but arouses easily.  Speaks in 2-3 words.  Disoriented.  Gonzella Lex, NP-C

## 2017-12-12 ENCOUNTER — Emergency Department (HOSPITAL_COMMUNITY)
Admission: EM | Admit: 2017-12-12 | Discharge: 2017-12-13 | Disposition: A | Discharge status: Home / Self Care | Payer: Medicare Other | Attending: Emergency Medicine | Admitting: Emergency Medicine

## 2017-12-12 DIAGNOSIS — I119 Hypertensive heart disease without heart failure: Secondary | ICD-10-CM | POA: Insufficient documentation

## 2017-12-12 DIAGNOSIS — G309 Alzheimer's disease, unspecified: Secondary | ICD-10-CM | POA: Diagnosis not present

## 2017-12-12 DIAGNOSIS — Z79899 Other long term (current) drug therapy: Secondary | ICD-10-CM | POA: Diagnosis not present

## 2017-12-12 DIAGNOSIS — R7989 Other specified abnormal findings of blood chemistry: Secondary | ICD-10-CM | POA: Diagnosis present

## 2017-12-12 DIAGNOSIS — D649 Anemia, unspecified: Secondary | ICD-10-CM | POA: Insufficient documentation

## 2017-12-12 DIAGNOSIS — E119 Type 2 diabetes mellitus without complications: Secondary | ICD-10-CM | POA: Insufficient documentation

## 2017-12-12 DIAGNOSIS — I2510 Atherosclerotic heart disease of native coronary artery without angina pectoris: Secondary | ICD-10-CM | POA: Diagnosis not present

## 2017-12-12 DIAGNOSIS — E039 Hypothyroidism, unspecified: Secondary | ICD-10-CM | POA: Insufficient documentation

## 2017-12-12 LAB — I-STAT CHEM 8, ED
BUN: 13 mg/dL (ref 6–20)
CREATININE: 0.5 mg/dL (ref 0.44–1.00)
Calcium, Ion: 1.32 mmol/L (ref 1.15–1.40)
Chloride: 101 mmol/L (ref 101–111)
GLUCOSE: 71 mg/dL (ref 65–99)
HCT: 20 % — ABNORMAL LOW (ref 36.0–46.0)
HEMOGLOBIN: 6.8 g/dL — AB (ref 12.0–15.0)
POTASSIUM: 4.5 mmol/L (ref 3.5–5.1)
SODIUM: 139 mmol/L (ref 135–145)
TCO2: 31 mmol/L (ref 22–32)

## 2017-12-12 LAB — BASIC METABOLIC PANEL
ANION GAP: 3 — AB (ref 5–15)
BUN: 15 mg/dL (ref 6–20)
CHLORIDE: 105 mmol/L (ref 101–111)
CO2: 31 mmol/L (ref 22–32)
Calcium: 9 mg/dL (ref 8.9–10.3)
Creatinine, Ser: 0.56 mg/dL (ref 0.44–1.00)
GFR calc non Af Amer: >60 mL/min (ref >60)
GLUCOSE: 76 mg/dL (ref 65–99)
POTASSIUM: 4.6 mmol/L (ref 3.5–5.1)
Sodium: 139 mmol/L (ref 135–145)

## 2017-12-12 LAB — CBC WITH DIFFERENTIAL/PLATELET
BASOS ABS: 0 10*3/uL (ref 0.0–0.1)
Basophils Relative: 0 %
EOS PCT: 1 %
Eosinophils Absolute: 0.1 10*3/uL (ref 0.0–0.7)
HEMATOCRIT: 21.2 % — AB (ref 36.0–46.0)
HEMOGLOBIN: 7 g/dL — AB (ref 12.0–15.0)
LYMPHS ABS: 1.4 10*3/uL (ref 0.7–4.0)
LYMPHS PCT: 22 %
MCH: 32.9 pg (ref 26.0–34.0)
MCHC: 33 g/dL (ref 30.0–36.0)
MCV: 99.5 fL (ref 78.0–100.0)
MONOS PCT: 6 %
Monocytes Absolute: 0.4 10*3/uL (ref 0.1–1.0)
Neutro Abs: 4.3 10*3/uL (ref 1.7–7.7)
Neutrophils Relative %: 71 %
Platelets: 140 10*3/uL — ABNORMAL LOW (ref 150–400)
RBC: 2.13 MIL/uL — AB (ref 3.87–5.11)
RDW: 18.7 % — ABNORMAL HIGH (ref 11.5–15.5)
WBC: 6.2 10*3/uL (ref 4.0–10.5)

## 2017-12-12 LAB — PREPARE RBC (CROSSMATCH)

## 2017-12-12 MED ORDER — SODIUM CHLORIDE 0.9 % IV SOLN: Freq: Once | INTRAVENOUS | Status: DC

## 2017-12-12 NOTE — ED Notes (Signed)
Bed: LA45 Expected date:  Expected time:  Means of arrival:  Comments: Hold for 25

## 2017-12-12 NOTE — ED Notes (Signed)
Bed: [facility] Expected date:  Expected time:  Means of arrival:  Comments: EMS-abnormal labs 84 y/o

## 2017-12-12 NOTE — ED Notes (Addendum)
Pt has hx of dementia. Unable to give blood yet due to family not giving consent. Attempted to contact twice.

## 2017-12-12 NOTE — ED Provider Notes (Addendum)
[facility] DEPT Provider Note   CSN: [id number] Arrival date & time: 12/12/17  1629     History   Chief Complaint Chief Complaint  Patient presents with  . Abnormal Lab    HPI Jean Hodges is a 84 y.o. female.  84 yo F with a chief complaints of low hemoglobin.  This been an ongoing issue for her.  Patient is demented and unable to provide further history.  Level 5 caveat dementia.  The history is provided by the patient.  Abnormal Lab  Illness  This is a new problem. The current episode started 2 days ago. The problem occurs constantly. The problem has not changed since onset.Pertinent negatives include no chest pain, no headaches and no shortness of breath. Nothing aggravates the symptoms. Nothing relieves the symptoms. She has tried nothing for the symptoms. The treatment provided no relief.    Past Medical History:  Diagnosis Date  . Acute encephalopathy   . Acute encephalopathy 09/30/2016  . Alzheimer's dementia without behavioral disturbance   . Alzheimer's dementia without behavioral disturbance 10/07/2016  . Anemia   . Anemia 01/28/2014  . Atherosclerotic PVD with ulceration ([place])   . CAD (coronary artery disease)   . CAD (coronary artery disease) 01/04/2014  . Cellulitis   . Cellulitis of left leg   . Diabetes mellitus without complication ([place])   . DM (diabetes mellitus) ([place]) 01/27/2014  . HTN (hypertension) 01/04/2014  . Hyperlipidemia   . Hypertension   . Hypoglycemia associated with type 2 diabetes mellitus ([place])   . Macrocytic anemia   . Seizures ([place]) 09/30/2016  . Tear of left rotator cuff   . UTI (urinary tract infection)   . UTI (urinary tract infection) 09/25/2016    Patient Active Problem List   Diagnosis Date Noted  . Memory loss 12/02/2017  . Bruising, spontaneous 12/01/2017  . Adult failure to thrive   . Palliative care by specialist   . Palliative care encounter   . Altered mental status   . Pressure  injury of skin 11/04/2017  . Hyperkalemia 11/04/2017  . UTI (urinary tract infection) 11/03/2017  . Severe protein-calorie malnutrition ([place]) 10/10/2017  . Anemia 07/23/2017  . Symptomatic anemia 11/27/2016  . Tonic-clonic seizure disorder ([place]) 10/09/2016  . Dysphagia 10/09/2016  . Moderate protein-calorie malnutrition ([place]) 10/09/2016  . Hypothyroidism (acquired) 10/09/2016  . Type 2 diabetes mellitus with peripheral vascular disease ([place]) 10/09/2016  . PVD (peripheral vascular disease) ([place]) 10/09/2016  . Essential hypertension 10/09/2016  . Alzheimer's dementia 10/09/2016  . Chronic depression 10/09/2016  . Hyperlipidemia 10/09/2016  . Coronary artery disease involving native coronary artery of native heart without angina pectoris 10/09/2016    Past Surgical History:  Procedure Laterality Date  . PACEMAKER IMPLANT       OB History   None      Home Medications    Prior to Admission medications   Medication Sig Start Date End Date Taking? Authorizing Provider  carvedilol (COREG) 12.5 MG tablet Take 12.5 mg by mouth 2 (two) times daily with a meal. Hold for SBP<110 and/or pulse <60/min   Yes [provider]  Cholecalciferol (VITAMIN D3) 2000 units TABS Take 2,000 Units by mouth daily.   Yes [provider]  ferrous sulfate 325 (65 FE) MG tablet Take 325 mg by mouth 2 (two) times daily with a meal.   Yes [provider]  levETIRAcetam (KEPPRA) 500 MG tablet Take 500 mg by mouth 2 (two) times daily.  Yes [provider]  levothyroxine (SYNTHROID, LEVOTHROID) 100 MCG tablet Take 100 mcg by mouth daily before breakfast.   Yes [provider]  mirtazapine (REMERON) 15 MG tablet Take 15 mg by mouth at bedtime.   Yes [provider]  Multiple Vitamins-Minerals (DECUBI-VITE PO) Take 1 capsule by mouth daily.   Yes [provider]  ondansetron (ZOFRAN) 4 MG tablet Take 4 mg by mouth every 6 (six) hours as needed for  nausea or vomiting.   Yes [provider]  pantoprazole (PROTONIX) 40 MG tablet Take 1 tablet (40 mg total) by mouth daily. 07/25/17  Yes Barton Dubois, MD  polyethylene glycol [facility] / GLYCOLAX) packet Take 17 g by mouth daily.   Yes [provider]  potassium chloride SA (K-DUR,KLOR-CON) 20 MEQ tablet Take 20 mEq by mouth daily.   Yes [provider]  sennosides-docusate sodium (SENOKOT-S) 8.6-50 MG tablet Take 2 tablets by mouth 2 (two) times daily.   Yes [provider]  sertraline (ZOLOFT) 50 MG tablet Take 75 mg by mouth daily.   Yes [provider]  feeding supplement, ENSURE ENLIVE, (ENSURE ENLIVE) LIQD Take 237 mLs by mouth 3 (three) times daily between meals. Patient not taking: Reported on 11/03/2017 10/10/17   Yaakov Guthrie, MD  furosemide (LASIX) 20 MG tablet Take 1 tablet (20 mg total) by mouth daily. Patient not taking: Reported on 12/12/2017 11/14/17 11/14/18  Jani Gravel, MD  nystatin (MYCOSTATIN) 100000 UNIT/ML suspension Take 5 mLs (500,000 Units total) by mouth 4 (four) times daily. Patient not taking: Reported on 12/12/2017 11/14/17   Jani Gravel, MD    Family History Family History  Family history unknown: Yes    Social History Social History   Tobacco Use  . Smoking status: Never Smoker  . Smokeless tobacco: Never Used  Substance Use Topics  . Alcohol use: No  . Drug use: No     Allergies   Acetaminophen; Codeine; Dilaudid [hydromorphone]; Hydrocodone; and Keflex [cephalexin]   Review of Systems Review of Systems  Unable to perform ROS: Dementia  Constitutional: Negative for chills and fever.  HENT: Negative for congestion and rhinorrhea.   Eyes: Negative for redness and visual disturbance.  Respiratory: Negative for shortness of breath and wheezing.   Cardiovascular: Negative for chest pain and palpitations.  Gastrointestinal: Negative for nausea and vomiting.  Genitourinary: Negative for dysuria and urgency.    Musculoskeletal: Negative for arthralgias and myalgias.  Skin: Negative for pallor and wound.  Neurological: Negative for dizziness and headaches.     Physical Exam Updated Vital Signs BP (!) 137/51   Pulse 79   Temp 98.2 F (36.8 C) (Oral)   Resp 17   SpO2 100%   Physical Exam  Constitutional: She appears well-developed and well-nourished. No distress.  Cachectic.   HENT:  Head: Normocephalic and atraumatic.  Marked pallor   Eyes: Pupils are equal, round, and reactive to light. EOM are normal.  Neck: Normal range of motion. Neck supple.  Cardiovascular: Normal rate and regular rhythm. Exam reveals no gallop and no friction rub.  No murmur heard. Pulmonary/Chest: Effort normal. She has no wheezes. She has no rales.  Abdominal: Soft. She exhibits no distension. There is no tenderness.  Musculoskeletal: She exhibits no edema or tenderness.  Neurological: She is alert.  Skin: Skin is warm and dry. She is not diaphoretic.  Psychiatric: She has a normal mood and affect. Her behavior is normal.  Nursing note and vitals reviewed.  ED Treatments / Results  Labs (all labs ordered are listed, but only abnormal results are displayed) Labs Reviewed  CBC WITH DIFFERENTIAL/PLATELET - Abnormal; Notable for the following components:      Result Value   RBC 2.13 (*)    Hemoglobin 7.0 (*)    HCT 21.2 (*)    RDW 18.7 (*)    Platelets 140 (*)    All other components within normal limits  BASIC METABOLIC PANEL - Abnormal; Notable for the following components:   Anion gap 3 (*)    All other components within normal limits  I-STAT CHEM 8, ED - Abnormal; Notable for the following components:   Hemoglobin 6.8 (*)    HCT 20.0 (*)    All other components within normal limits  TYPE AND SCREEN  PREPARE RBC (CROSSMATCH)    EKG None  Radiology No results found.  Procedures Procedures (including critical care time)  Medications Ordered in ED Medications  0.9 %  sodium  chloride infusion (has no administration in time range)     Initial Impression / Assessment and Plan / ED Course  I have reviewed the triage vital signs and the nursing notes.  Pertinent labs & imaging results that were available during my care of the patient were reviewed by me and considered in my medical decision making (see chart for details).     84 yo F with a cc of anemia.  Hx of same.  Old records were reviewed and the patient has a visit from the hematologist where she was evaluated for her chronic anemia and they established a goal to transfuse her below 7-7.5.  Her i-STAT was 6.8 and her actual lab was 7.  I will give 1 unit and discharge home.  We will have her follow-up with her PCP and hematologist.   CRITICAL CARE Performed by: Cecilio Asper   Total critical care time: 35 minutes  Critical care time was exclusive of separately billable procedures and treating other patients.  Critical care was necessary to treat or prevent imminent or life-threatening deterioration.  Critical care was time spent personally by me on the following activities: development of treatment plan with patient and/or surrogate as well as nursing, discussions with consultants, evaluation of patient's response to treatment, examination of patient, obtaining history from patient or surrogate, ordering and performing treatments and interventions, ordering and review of laboratory studies, ordering and review of radiographic studies, pulse oximetry and re-evaluation of patient's condition.   11:20 PM:  I have discussed the diagnosis/risks/treatment options with the patient and believe the pt to be eligible for discharge home to follow-up with PCP. We also discussed returning to the ED immediately if new or worsening sx occur. We discussed the sx which are most concerning (e.g., sudden worsening pain, fever, inability to tolerate by mouth) that necessitate immediate return. Medications administered to  the patient during their visit and any new prescriptions provided to the patient are listed below.  Medications given during this visit Medications  0.9 %  sodium chloride infusion (has no administration in time range)      The patient appears reasonably screen and/or stabilized for discharge and I doubt any other medical condition or other [facility] requiring further screening, evaluation, or treatment in the ED at this time prior to discharge.      Final Clinical Impressions(s) / ED Diagnoses   Final diagnoses:  Symptomatic anemia    ED Discharge Orders    None  Deno Etienne, DO 12/12/17 [place], [place], DO 12/22/17 Lona Kettle

## 2017-12-12 NOTE — ED Triage Notes (Signed)
Transported by [facility] from [facility] SNF--reported hemoglobin of 6.7. Pt is asymptomatic. AAO x 1 which is baseline.

## 2017-12-12 NOTE — Discharge Instructions (Addendum)
Follow up with your PCP and oncologist.

## 2017-12-13 NOTE — ED Notes (Signed)
Transportation called to take patient back to nursing facility.

## 2017-12-15 LAB — BPAM RBC
BLOOD PRODUCT EXPIRATION DATE: 201906192359
ISSUE DATE / TIME: 201905312039
UNIT TYPE AND RH: 6200

## 2017-12-15 LAB — TYPE AND SCREEN
ABO/RH(D): A POS
ANTIBODY SCREEN: NEGATIVE
UNIT DIVISION: 0

## 2017-12-23 ENCOUNTER — Non-Acute Institutional Stay: Payer: Medicare Other | Admitting: Internal Medicine

## 2017-12-23 DIAGNOSIS — E43 Unspecified severe protein-calorie malnutrition: Secondary | ICD-10-CM

## 2017-12-23 DIAGNOSIS — R413 Other amnesia: Secondary | ICD-10-CM

## 2017-12-23 DIAGNOSIS — Z515 Encounter for palliative care: Secondary | ICD-10-CM

## 2017-12-29 ENCOUNTER — Emergency Department (HOSPITAL_COMMUNITY): Payer: Medicare Other

## 2017-12-29 ENCOUNTER — Encounter (HOSPITAL_COMMUNITY): Payer: Self-pay

## 2017-12-29 ENCOUNTER — Observation Stay (HOSPITAL_COMMUNITY)
Admission: EM | Admit: 2017-12-29 | Discharge: 2017-12-30 | Disposition: A | Discharge status: Skilled Nursing Facility | Payer: Medicare Other | Attending: Family Medicine | Admitting: Family Medicine

## 2017-12-29 ENCOUNTER — Other Ambulatory Visit: Payer: Self-pay

## 2017-12-29 DIAGNOSIS — D649 Anemia, unspecified: Secondary | ICD-10-CM | POA: Diagnosis present

## 2017-12-29 DIAGNOSIS — F0280 Dementia in other diseases classified elsewhere without behavioral disturbance: Secondary | ICD-10-CM | POA: Diagnosis not present

## 2017-12-29 DIAGNOSIS — Z79899 Other long term (current) drug therapy: Secondary | ICD-10-CM | POA: Insufficient documentation

## 2017-12-29 DIAGNOSIS — G319 Degenerative disease of nervous system, unspecified: Secondary | ICD-10-CM | POA: Insufficient documentation

## 2017-12-29 DIAGNOSIS — E1151 Type 2 diabetes mellitus with diabetic peripheral angiopathy without gangrene: Secondary | ICD-10-CM | POA: Diagnosis not present

## 2017-12-29 DIAGNOSIS — G9340 Encephalopathy, unspecified: Secondary | ICD-10-CM | POA: Diagnosis not present

## 2017-12-29 DIAGNOSIS — G40909 Epilepsy, unspecified, not intractable, without status epilepticus: Secondary | ICD-10-CM | POA: Diagnosis present

## 2017-12-29 DIAGNOSIS — F329 Major depressive disorder, single episode, unspecified: Secondary | ICD-10-CM | POA: Diagnosis not present

## 2017-12-29 DIAGNOSIS — Z8673 Personal history of transient ischemic attack (TIA), and cerebral infarction without residual deficits: Secondary | ICD-10-CM | POA: Insufficient documentation

## 2017-12-29 DIAGNOSIS — Z881 Allergy status to other antibiotic agents status: Secondary | ICD-10-CM | POA: Insufficient documentation

## 2017-12-29 DIAGNOSIS — Z885 Allergy status to narcotic agent status: Secondary | ICD-10-CM | POA: Insufficient documentation

## 2017-12-29 DIAGNOSIS — I480 Paroxysmal atrial fibrillation: Secondary | ICD-10-CM | POA: Insufficient documentation

## 2017-12-29 DIAGNOSIS — J90 Pleural effusion, not elsewhere classified: Secondary | ICD-10-CM | POA: Diagnosis not present

## 2017-12-29 DIAGNOSIS — I10 Essential (primary) hypertension: Secondary | ICD-10-CM | POA: Diagnosis present

## 2017-12-29 DIAGNOSIS — I2510 Atherosclerotic heart disease of native coronary artery without angina pectoris: Secondary | ICD-10-CM | POA: Diagnosis not present

## 2017-12-29 DIAGNOSIS — E440 Moderate protein-calorie malnutrition: Secondary | ICD-10-CM | POA: Diagnosis present

## 2017-12-29 DIAGNOSIS — I5022 Chronic systolic (congestive) heart failure: Secondary | ICD-10-CM | POA: Insufficient documentation

## 2017-12-29 DIAGNOSIS — I447 Left bundle-branch block, unspecified: Secondary | ICD-10-CM | POA: Insufficient documentation

## 2017-12-29 DIAGNOSIS — I110 Hypertensive heart disease with heart failure: Secondary | ICD-10-CM | POA: Insufficient documentation

## 2017-12-29 DIAGNOSIS — E039 Hypothyroidism, unspecified: Secondary | ICD-10-CM | POA: Diagnosis present

## 2017-12-29 DIAGNOSIS — Z9889 Other specified postprocedural states: Secondary | ICD-10-CM | POA: Insufficient documentation

## 2017-12-29 DIAGNOSIS — G309 Alzheimer's disease, unspecified: Secondary | ICD-10-CM | POA: Insufficient documentation

## 2017-12-29 DIAGNOSIS — I6782 Cerebral ischemia: Secondary | ICD-10-CM | POA: Insufficient documentation

## 2017-12-29 DIAGNOSIS — N390 Urinary tract infection, site not specified: Secondary | ICD-10-CM | POA: Diagnosis present

## 2017-12-29 DIAGNOSIS — Z950 Presence of cardiac pacemaker: Secondary | ICD-10-CM | POA: Insufficient documentation

## 2017-12-29 DIAGNOSIS — R4182 Altered mental status, unspecified: Secondary | ICD-10-CM | POA: Diagnosis not present

## 2017-12-29 DIAGNOSIS — D696 Thrombocytopenia, unspecified: Secondary | ICD-10-CM | POA: Diagnosis not present

## 2017-12-29 DIAGNOSIS — Z79890 Hormone replacement therapy (postmenopausal): Secondary | ICD-10-CM | POA: Insufficient documentation

## 2017-12-29 DIAGNOSIS — Z89511 Acquired absence of right leg below knee: Secondary | ICD-10-CM | POA: Insufficient documentation

## 2017-12-29 DIAGNOSIS — E785 Hyperlipidemia, unspecified: Secondary | ICD-10-CM | POA: Diagnosis not present

## 2017-12-29 DIAGNOSIS — Z87440 Personal history of urinary (tract) infections: Secondary | ICD-10-CM | POA: Insufficient documentation

## 2017-12-29 HISTORY — DX: Dysphagia, unspecified: R13.10

## 2017-12-29 HISTORY — DX: Heart failure, unspecified: I50.9

## 2017-12-29 LAB — CBC WITH DIFFERENTIAL/PLATELET
ABS IMMATURE GRANULOCYTES: 0 10*3/uL (ref 0.0–0.1)
Abs Immature Granulocytes: 0 10*3/uL (ref 0.0–0.1)
BASOS ABS: 0 10*3/uL (ref 0.0–0.1)
BASOS PCT: 0 %
Basophils Absolute: 0 10*3/uL (ref 0.0–0.1)
Basophils Relative: 0 %
EOS ABS: 0.2 10*3/uL (ref 0.0–0.7)
EOS PCT: 3 %
Eosinophils Absolute: 0.3 10*3/uL (ref 0.0–0.7)
Eosinophils Relative: 4 %
HCT: 14.4 % — ABNORMAL LOW (ref 36.0–46.0)
HEMATOCRIT: 24.7 % — AB (ref 36.0–46.0)
HEMOGLOBIN: 4.6 g/dL — AB (ref 12.0–15.0)
HEMOGLOBIN: 7.8 g/dL — AB (ref 12.0–15.0)
IMMATURE GRANULOCYTES: 1 %
Immature Granulocytes: 1 %
LYMPHS ABS: 1.8 10*3/uL (ref 0.7–4.0)
LYMPHS PCT: 33 %
LYMPHS PCT: 30 %
Lymphs Abs: 2.5 10*3/uL (ref 0.7–4.0)
MCH: 32.6 pg (ref 26.0–34.0)
MCH: 32.5 pg (ref 26.0–34.0)
MCHC: 31.9 g/dL (ref 30.0–36.0)
MCHC: 31.6 g/dL (ref 30.0–36.0)
MCV: 102.1 fL — ABNORMAL HIGH (ref 78.0–100.0)
MCV: 102.9 fL — ABNORMAL HIGH (ref 78.0–100.0)
MONO ABS: 0.3 10*3/uL (ref 0.1–1.0)
MONOS PCT: 4 %
Monocytes Absolute: 0.3 10*3/uL (ref 0.1–1.0)
Monocytes Relative: 5 %
NEUTROS ABS: 4.3 10*3/uL (ref 1.7–7.7)
NEUTROS PCT: 58 %
Neutro Abs: 3.7 10*3/uL (ref 1.7–7.7)
Neutrophils Relative %: 61 %
PLATELETS: 146 10*3/uL — AB (ref 150–400)
Platelets: 124 10*3/uL — ABNORMAL LOW (ref 150–400)
RBC: 1.41 MIL/uL — ABNORMAL LOW (ref 3.87–5.11)
RBC: 2.4 MIL/uL — ABNORMAL LOW (ref 3.87–5.11)
RDW: 18.1 % — ABNORMAL HIGH (ref 11.5–15.5)
RDW: 17.8 % — ABNORMAL HIGH (ref 11.5–15.5)
WBC: 7.5 10*3/uL (ref 4.0–10.5)
WBC: 6.1 10*3/uL (ref 4.0–10.5)

## 2017-12-29 LAB — COMPREHENSIVE METABOLIC PANEL
ALBUMIN: 2.3 g/dL — AB (ref 3.5–5.0)
ALK PHOS: 113 U/L (ref 38–126)
ALT: 44 U/L (ref 14–54)
ANION GAP: 3 — AB (ref 5–15)
AST: 56 U/L — ABNORMAL HIGH (ref 15–41)
BUN: 16 mg/dL (ref 6–20)
CALCIUM: 8.7 mg/dL — AB (ref 8.9–10.3)
CO2: 30 mmol/L (ref 22–32)
Chloride: 105 mmol/L (ref 101–111)
Creatinine, Ser: 0.6 mg/dL (ref 0.44–1.00)
GFR calc non Af Amer: >60 mL/min (ref >60)
Glucose, Bld: 84 mg/dL (ref 65–99)
POTASSIUM: 5.6 mmol/L — AB (ref 3.5–5.1)
SODIUM: 138 mmol/L (ref 135–145)
TOTAL PROTEIN: 5.4 g/dL — AB (ref 6.5–8.1)
Total Bilirubin: 1 mg/dL (ref 0.3–1.2)

## 2017-12-29 LAB — I-STAT CHEM 8, ED
BUN: 22 mg/dL — ABNORMAL HIGH (ref 6–20)
CREATININE: 0.6 mg/dL (ref 0.44–1.00)
Calcium, Ion: 1.27 mmol/L (ref 1.15–1.40)
Chloride: 102 mmol/L (ref 101–111)
Glucose, Bld: 81 mg/dL (ref 65–99)
HCT: 22 % — ABNORMAL LOW (ref 36.0–46.0)
HEMOGLOBIN: 7.5 g/dL — AB (ref 12.0–15.0)
POTASSIUM: 5.6 mmol/L — AB (ref 3.5–5.1)
Sodium: 138 mmol/L (ref 135–145)
TCO2: 30 mmol/L (ref 22–32)

## 2017-12-29 LAB — ABO/RH: ABO/RH(D): A POS

## 2017-12-29 LAB — URINALYSIS, ROUTINE W REFLEX MICROSCOPIC
BILIRUBIN URINE: NEGATIVE
GLUCOSE, UA: NEGATIVE mg/dL
HGB URINE DIPSTICK: NEGATIVE
KETONES UR: NEGATIVE mg/dL
NITRITE: NEGATIVE
PH: 7 (ref 5.0–8.0)
Protein, ur: NEGATIVE mg/dL
Specific Gravity, Urine: 1.01 (ref 1.005–1.030)

## 2017-12-29 LAB — PROTIME-INR
INR: 1.24
Prothrombin Time: 15.5 seconds — ABNORMAL HIGH (ref 11.4–15.2)

## 2017-12-29 LAB — AMMONIA: AMMONIA: 25 umol/L (ref 9–35)

## 2017-12-29 LAB — CBG MONITORING, ED: GLUCOSE-CAPILLARY: 71 mg/dL (ref 65–99)

## 2017-12-29 LAB — I-STAT TROPONIN, ED: TROPONIN I, POC: 0.02 ng/mL (ref 0.00–0.08)

## 2017-12-29 LAB — PREPARE RBC (CROSSMATCH)

## 2017-12-29 LAB — POC OCCULT BLOOD, ED: Fecal Occult Bld: NEGATIVE

## 2017-12-29 MED ORDER — SERTRALINE HCL 50 MG PO TABS
75.0000 mg | ORAL_TABLET | Freq: Every day | ORAL | Status: DC
  Administered 2017-12-30: 75 mg via ORAL
  Filled 2017-12-29: qty 2

## 2017-12-29 MED ORDER — PANTOPRAZOLE SODIUM 40 MG PO TBEC
40.0000 mg | DELAYED_RELEASE_TABLET | Freq: Every day | ORAL | Status: DC
  Administered 2017-12-30: 40 mg via ORAL
  Filled 2017-12-29: qty 1

## 2017-12-29 MED ORDER — ONDANSETRON HCL 4 MG/2ML IJ SOLN
4.0000 mg | Freq: Four times a day (QID) | INTRAMUSCULAR | Status: DC | PRN
Start: 2017-12-29 — End: 2017-12-30

## 2017-12-29 MED ORDER — SODIUM CHLORIDE 0.9 % IV SOLN
Freq: Once | INTRAVENOUS | Status: AC
  Administered 2017-12-29: 21:00:00 via INTRAVENOUS

## 2017-12-29 MED ORDER — POLYETHYLENE GLYCOL 3350 17 G PO PACK
17.0000 g | PACK | Freq: Every day | ORAL | Status: DC
  Administered 2017-12-30 (×2): 17 g via ORAL
  Filled 2017-12-29 (×2): qty 1

## 2017-12-29 MED ORDER — MAGNESIUM OXIDE 400 (241.3 MG) MG PO TABS: 400.0000 mg | ORAL_TABLET | Freq: Every day | ORAL | Status: DC

## 2017-12-29 MED ORDER — LEVOTHYROXINE SODIUM 100 MCG PO TABS
100.0000 ug | ORAL_TABLET | Freq: Every day | ORAL | Status: DC
  Administered 2017-12-30: 100 ug via ORAL
  Filled 2017-12-29 (×2): qty 1

## 2017-12-29 MED ORDER — CARVEDILOL 12.5 MG PO TABS
12.5000 mg | ORAL_TABLET | Freq: Two times a day (BID) | ORAL | Status: DC
  Administered 2017-12-30: 12.5 mg via ORAL
  Filled 2017-12-29 (×2): qty 1

## 2017-12-29 MED ORDER — POTASSIUM CHLORIDE CRYS ER 20 MEQ PO TBCR
20.0000 meq | EXTENDED_RELEASE_TABLET | Freq: Every day | ORAL | Status: DC
  Administered 2017-12-30 (×2): 20 meq via ORAL
  Filled 2017-12-29 (×2): qty 1

## 2017-12-29 MED ORDER — FERROUS SULFATE 325 (65 FE) MG PO TABS
325.0000 mg | ORAL_TABLET | Freq: Two times a day (BID) | ORAL | Status: DC
  Administered 2017-12-30: 325 mg via ORAL
  Filled 2017-12-29 (×3): qty 1

## 2017-12-29 MED ORDER — ENOXAPARIN SODIUM 40 MG/0.4ML ~~LOC~~ SOLN
40.0000 mg | SUBCUTANEOUS | Status: DC
  Administered 2017-12-30: 40 mg via SUBCUTANEOUS
  Filled 2017-12-29: qty 0.4

## 2017-12-29 MED ORDER — SODIUM CHLORIDE 0.9 % IV SOLN
1.0000 g | INTRAVENOUS | Status: DC
  Administered 2017-12-30: 1 g via INTRAVENOUS
  Filled 2017-12-29: qty 1
  Filled 2017-12-29: qty 10

## 2017-12-29 MED ORDER — MIRTAZAPINE 15 MG PO TABS
15.0000 mg | ORAL_TABLET | Freq: Every day | ORAL | Status: DC
  Filled 2017-12-29: qty 1

## 2017-12-29 MED ORDER — SENNOSIDES-DOCUSATE SODIUM 8.6-50 MG PO TABS
2.0000 | ORAL_TABLET | Freq: Two times a day (BID) | ORAL | Status: DC
  Administered 2017-12-30 (×2): 2 via ORAL
  Filled 2017-12-29 (×2): qty 2

## 2017-12-29 MED ORDER — LEVETIRACETAM 500 MG PO TABS
500.0000 mg | ORAL_TABLET | Freq: Two times a day (BID) | ORAL | Status: DC
  Administered 2017-12-30 (×2): 500 mg via ORAL
  Filled 2017-12-29 (×2): qty 1

## 2017-12-29 MED ORDER — ONDANSETRON HCL 4 MG PO TABS: 4.0000 mg | ORAL_TABLET | Freq: Four times a day (QID) | ORAL | Status: DC | PRN

## 2017-12-29 NOTE — ED Triage Notes (Signed)
Pt from [facility] with ems for low hgb and rectal bleeding but facility was unable to tell us what the lab level was. Pt alert and oriented x1 per baseline. VSS, nad

## 2017-12-29 NOTE — ED Notes (Signed)
Dr Ralene Bathe received verbal consent for blood transfusion from the pts son over the phone due to pt unable to consent due to AMS

## 2017-12-29 NOTE — H&P (Signed)
History and Physical    Jean Hodges WEX:[id number] DOB: October 06, 1933 DOA: 12/29/2017  PCP: Patient, No Pcp Per  Patient coming from: Skilled nursing facility.  Chief Complaint: Increasing confusion and looking pale.  HPI: Jean Hodges is a 84 y.o. female with history of chronic systolic heart failure, paroxysmal atrial fibrillation, hypothyroidism, dementia, depression, seizure and chronic anemia and thrombocytopenia for which patient and family had declined any bone marrow evaluation and is being treated symptomatically with blood transfusions had been recently to the hospital last month first week for metabolic encephalopathy cause was not clear at the time patient also had UTI was brought to the ER after patient was found to be increasingly lethargic and appearing pale.  As per the patient's son patient gets more confused when patient becomes more anemic.  ED Course: In the ER initial hemoglobin was around 4 repeat was around 7.  Stool for occult blood was negative.  UA shows features concerning for UTI.  Patient was hallucinating in the ER.  CT head is unremarkable.  MRI brain cannot be done due to patient having pacemaker.  Since patient has anemia and family was concerned ER physician ordered 2 units of PRBC.  Ceftriaxone was started for possible UTI.  Patient admitted for further observation of mental status changes and symptomatic anemia.  Review of Systems: As per HPI, rest all negative.   Past Medical History:  Diagnosis Date  . Acute encephalopathy   . Acute encephalopathy 09/30/2016  . Alzheimer's dementia without behavioral disturbance   . Alzheimer's dementia without behavioral disturbance 10/07/2016  . Anemia   . Anemia 01/28/2014  . Atherosclerotic PVD with ulceration ([place])   . CAD (coronary artery disease)   . CAD (coronary artery disease) 01/04/2014  . Cellulitis   . Cellulitis of left leg   . CHF (congestive heart failure) ([place])   . Diabetes mellitus without  complication ([place])   . DM (diabetes mellitus) ([place]) 01/27/2014  . Dysphagia   . HTN (hypertension) 01/04/2014  . Hyperlipidemia   . Hypertension   . Hypoglycemia associated with type 2 diabetes mellitus ([place])   . Macrocytic anemia   . Seizures ([place]) 09/30/2016  . Tear of left rotator cuff   . UTI (urinary tract infection)   . UTI (urinary tract infection) 09/25/2016    Past Surgical History:  Procedure Laterality Date  . BELOW KNEE LEG AMPUTATION Right   . PACEMAKER IMPLANT       reports that she has never smoked. She has never used smokeless tobacco. She reports that she does not drink alcohol or use drugs.  Allergies  Allergen Reactions  . Acetaminophen Other (See Comments)    Unknown  . Codeine Nausea Only  . Dilaudid [Hydromorphone] Nausea Only  . Hydrocodone Other (See Comments)    Hallucinations  . Keflex [Cephalexin] Diarrhea    Family History  Family history unknown: Yes    Prior to Admission medications   Medication Sig Start Date End Date Taking? Authorizing Provider  carvedilol (COREG) 12.5 MG tablet Take 12.5 mg by mouth 2 (two) times daily with a meal. Hold for SBP<110 and/or pulse <60/min   Yes [provider]  ferrous sulfate 325 (65 FE) MG tablet Take 325 mg by mouth 2 (two) times daily with a meal.   Yes [provider]  levETIRAcetam (KEPPRA) 500 MG tablet Take 500 mg by mouth 2 (two) times daily.   Yes [provider]  levothyroxine (SYNTHROID, LEVOTHROID) 100 MCG tablet Take 100  mcg by mouth daily before breakfast.   Yes [provider]  magnesium oxide (MAG-OX) 400 MG tablet Take 400 mg by mouth daily.   Yes [provider]  mirtazapine (REMERON) 15 MG tablet Take 15 mg by mouth at bedtime.   Yes [provider]  Multiple Vitamins-Minerals (DECUBI-VITE PO) Take 1 capsule by mouth daily.   Yes [provider]  ondansetron (ZOFRAN) 4 MG tablet Take 4 mg by mouth every 6 (six) hours as  needed for nausea or vomiting.   Yes [provider]  ondansetron (ZOFRAN) 4 MG tablet Take 4 mg by mouth every 6 (six) hours as needed for nausea or vomiting.   Yes [provider]  pantoprazole (PROTONIX) 40 MG tablet Take 1 tablet (40 mg total) by mouth daily. 07/25/17  Yes Barton Dubois, MD  polyethylene glycol [facility] / GLYCOLAX) packet Take 17 g by mouth daily.   Yes [provider]  potassium chloride SA (K-DUR,KLOR-CON) 20 MEQ tablet Take 20 mEq by mouth daily.   Yes [provider]  sennosides-docusate sodium (SENOKOT-S) 8.6-50 MG tablet Take 2 tablets by mouth 2 (two) times daily.   Yes [provider]  sertraline (ZOLOFT) 50 MG tablet Take 75 mg by mouth daily.   Yes [provider]  feeding supplement, ENSURE ENLIVE, (ENSURE ENLIVE) LIQD Take 237 mLs by mouth 3 (three) times daily between meals. Patient not taking: Reported on 11/03/2017 10/10/17   Yaakov Guthrie, MD  furosemide (LASIX) 20 MG tablet Take 1 tablet (20 mg total) by mouth daily. Patient not taking: Reported on 12/12/2017 11/14/17 11/14/18  Jani Gravel, MD  nystatin (MYCOSTATIN) 100000 UNIT/ML suspension Take 5 mLs (500,000 Units total) by mouth 4 (four) times daily. Patient not taking: Reported on 12/12/2017 11/14/17   Jani Gravel, MD    Physical Exam: Vitals:   12/29/17 1930 12/29/17 2000 12/29/17 2030 12/29/17 2049  BP: (!) 121/51 (!) 122/47 (!) 116/50 (!) 130/49  Pulse: 79 79 79 79  Resp: 11 15 12 12   Temp:   98 F (36.7 C) 97.8 F (36.6 C)  TempSrc:   Oral Oral  SpO2: 100% 100% 100% 100%  Weight:      Height:          Constitutional: Moderately built and poorly nourished. Vitals:   12/29/17 1930 12/29/17 2000 12/29/17 2030 12/29/17 2049  BP: (!) 121/51 (!) 122/47 (!) 116/50 (!) 130/49  Pulse: 79 79 79 79  Resp: 11 15 12 12   Temp:   98 F (36.7 C) 97.8 F (36.6 C)  TempSrc:   Oral Oral  SpO2: 100% 100% 100% 100%  Weight:      Height:       Eyes:  Anicteric no pallor. ENMT: No discharge from the ears eyes nose or mouth. Neck: No mass palpated no neck rigidity. Respiratory: No rhonchi or crepitations. Cardiovascular: S1-S2 heard no murmurs appreciated. Abdomen: Soft nontender bowel sounds present. Musculoskeletal: No edema.  No joint effusion. Skin: No rash. Neurologic: Alert awake oriented to name.  Does not follow commands moves all extremities but difficult to assess neurologically. Psychiatric: Appears confused.   Labs on Admission: I have personally reviewed following labs and imaging studies  CBC: Recent Labs  Lab 12/29/17 1455 12/29/17 1543 12/29/17 1716  WBC  --  7.5 6.1  NEUTROABS  --  4.3 3.7  HGB 7.5* 4.6* 7.8*  HCT 22.0* 14.4* 24.7*  MCV  --  102.1* 102.9*  PLT  --  146*  881*   Basic Metabolic Panel: Recent Labs  Lab 12/29/17 1455 12/29/17 1543  NA 138 138  K 5.6* 5.6*  CL 102 105  CO2  --  30  GLUCOSE 81 84  BUN 22* 16  CREATININE 0.60 0.60  CALCIUM  --  8.7*   GFR: Estimated Creatinine Clearance: 43.5 mL/min (by C-G formula based on SCr of 0.6 mg/dL). Liver Function Tests: Recent Labs  Lab 12/29/17 1543  AST 56*  ALT 44  ALKPHOS 113  BILITOT 1.0  PROT 5.4*  ALBUMIN 2.3*   No results for input(s): LIPASE, AMYLASE in the last 168 hours. No results for input(s): AMMONIA in the last 168 hours. Coagulation Profile: Recent Labs  Lab 12/29/17 1543  INR 1.24   Cardiac Enzymes: No results for input(s): CKTOTAL, CKMB, CKMBINDEX, TROPONINI in the last 168 hours. BNP (last 3 results) No results for input(s): PROBNP in the last 8760 hours. HbA1C: No results for input(s): HGBA1C in the last 72 hours. CBG: Recent Labs  Lab 12/29/17 1420  GLUCAP 71   Lipid Profile: No results for input(s): CHOL, HDL, LDLCALC, TRIG, CHOLHDL, LDLDIRECT in the last 72 hours. Thyroid Function Tests: No results for input(s): TSH, T4TOTAL, FREET4, T3FREE, THYROIDAB in the last 72 hours. Anemia Panel: No  results for input(s): VITAMINB12, FOLATE, FERRITIN, TIBC, IRON, RETICCTPCT in the last 72 hours. Urine analysis:    Component Value Date/Time   COLORURINE YELLOW 12/29/2017 1922   APPEARANCEUR CLEAR 12/29/2017 1922   LABSPEC 1.010 12/29/2017 [place] 7.0 12/29/2017 [place] NEGATIVE 12/29/2017 [place] NEGATIVE 12/29/2017 [place] NEGATIVE 12/29/2017 [place] NEGATIVE 12/29/2017 1922   PROTEINUR NEGATIVE 12/29/2017 1922   NITRITE NEGATIVE 12/29/2017 1922   LEUKOCYTESUR SMALL (A) 12/29/2017 1922   Sepsis Labs: @LABRCNTIP (procalcitonin:4,lacticidven:4) )No results found for this or any previous visit (from the past 240 hour(s)).   Radiological Exams on Admission: Ct Head Wo Contrast  Result Date: 12/29/2017 CLINICAL DATA:  84 year old female with altered mental status. Alzheimer's dementia. Initial encounter. EXAM: CT HEAD WITHOUT CONTRAST TECHNIQUE: Contiguous axial images were obtained from the base of the skull through the vertex without intravenous contrast. COMPARISON:  11/06/2017 CT. FINDINGS: Brain: No intracranial hemorrhage or CT evidence of large acute infarct. Remote right parietal-occipital lobe infarct. Chronic microvascular changes. Moderate global atrophy. No intracranial mass lesion noted on this unenhanced exam. Vascular: Vascular calcifications. Skull: Negative Sinuses/Orbits: No acute orbital abnormality. Visualized paranasal sinuses are clear. Other: Minimal partial opacification inferior right mastoid air cells. Remainder of mastoid air cells and middle ear cavities are clear. IMPRESSION: No acute intracranial abnormality. Remote right parietal lobe infarct. Moderate chronic microvascular changes. Moderate global atrophy. Electronically Signed   By: Genia Del M.D.   On: 12/29/2017 18:46   Dg Chest Port 1 View  Result Date: 12/29/2017 CLINICAL DATA:  Altered mental status. EXAM: PORTABLE CHEST 1 VIEW COMPARISON:  Radiographs of Nov 12, 2017. FINDINGS: Stable cardiomegaly. Left-sided pacemaker is unchanged in position. No pneumothorax is noted. Stable bibasilar edema or atelectasis is noted with associated pleural effusions. Bony thorax is unremarkable. IMPRESSION: Stable bibasilar atelectasis or edema is noted with associated pleural effusions. Electronically Signed   By: Marijo Conception, M.D.   On: 12/29/2017 15:47    EKG: Independently reviewed.  Paced rhythm.  Assessment/Plan Principal Problem:   Acute encephalopathy Active Problems:   Moderate protein-calorie malnutrition ([place])   Hypothyroidism (acquired)   Type 2 diabetes mellitus  with peripheral vascular disease ([place])   Essential hypertension   Alzheimer's dementia   Coronary artery disease involving native coronary artery of native heart without angina pectoris   Symptomatic anemia   UTI (urinary tract infection)    1. Acute metabolic encephalopathy in a patient with dementia cause not clear could be multiple.  At this time patient is placed on ceftriaxone for possible UTI.  Check ammonia levels.  Cannot get MRI due to pacemaker.  Patient is getting transfusion for anemia which as per the family patient gets confused when her hemoglobin drops. 2. History of anemia and thrombocytopenia with possible myelodysplasia but family has refused any bone marrow biopsy and has been treated symptomatically with presently receiving 2 units of PRBC.  Follow CBC. 3. Possible UTI on ceftriaxone. 4. Hypothyroidism on Synthroid. 5. History of seizures on Keppra.  No signs of active seizures. 6. History of CHF last EF measured was 20 to 25% with moderate to severe mitral and tricuspid regurgitation.  Will order Lasix after transfusion. 7. History of proximal atrial fibrillation not on anticoagulation.  Rate controlled on Coreg. 8. History of diabetes per chart presently not on medications. 9. Depression on Zoloft.   DVT prophylaxis: Lovenox. Code Status: Full code. Family  Communication: No family at the bedside. Disposition Plan: Back to nursing facility. Consults called: Palliative care. Admission status: Observation.   Rise Patience MD [facility] Pager [phone].  If 7PM-7AM, please contact night-coverage [email or web address] Password [email or web address]  12/29/2017, 9:11 PM

## 2017-12-29 NOTE — ED Provider Notes (Addendum)
[place] EMERGENCY DEPARTMENT Provider Note   CSN: [id number] Arrival date & time: 12/29/17  1412     History   Chief Complaint Chief Complaint  Patient presents with  . GI Bleeding    HPI Jean Hodges is a 84 y.o. female.  The history is provided by the EMS personnel, the nursing home, a relative and the patient. No language interpreter was used.   Jean Hodges is a 84 y.o. female who presents to the Emergency Department complaining of anemia, AMS. Level V caveat due to confusion. According to nursing home staff they are concerned that she is anemic because she has been more lethargic for "a long time". They state that her lethargy is a sign of anemia. No reports of bleeding at the facility. Her son states that she has been looking more pale and is sleeping more and eating less recently. She does have a history of recurrent anemia and requires multiple blood transfusions. She has declined bone marrow biopsy but is agreeable to transfusions. No reports of fevers, nausea, vomiting. Her son states that she does complain of increased body pain as well as chest pain when her hemoglobin is low. Past Medical History:  Diagnosis Date  . Acute encephalopathy   . Acute encephalopathy 09/30/2016  . Alzheimer's dementia without behavioral disturbance   . Alzheimer's dementia without behavioral disturbance 10/07/2016  . Anemia   . Anemia 01/28/2014  . Atherosclerotic PVD with ulceration ([place])   . CAD (coronary artery disease)   . CAD (coronary artery disease) 01/04/2014  . Cellulitis   . Cellulitis of left leg   . CHF (congestive heart failure) ([place])   . Diabetes mellitus without complication ([place])   . DM (diabetes mellitus) ([place]) 01/27/2014  . Dysphagia   . HTN (hypertension) 01/04/2014  . Hyperlipidemia   . Hypertension   . Hypoglycemia associated with type 2 diabetes mellitus ([place])   . Macrocytic anemia   . Seizures ([place]) 09/30/2016  . Tear of left rotator cuff    . UTI (urinary tract infection)   . UTI (urinary tract infection) 09/25/2016    Patient Active Problem List   Diagnosis Date Noted  . Acute encephalopathy 12/29/2017  . Memory loss 12/02/2017  . Bruising, spontaneous 12/01/2017  . Adult failure to thrive   . Palliative care by specialist   . Palliative care encounter   . Altered mental status   . Pressure injury of skin 11/04/2017  . Hyperkalemia 11/04/2017  . UTI (urinary tract infection) 11/03/2017  . Severe protein-calorie malnutrition ([place]) 10/10/2017  . Anemia 07/23/2017  . Symptomatic anemia 11/27/2016  . Tonic-clonic seizure disorder ([place]) 10/09/2016  . Dysphagia 10/09/2016  . Moderate protein-calorie malnutrition ([place]) 10/09/2016  . Hypothyroidism (acquired) 10/09/2016  . Type 2 diabetes mellitus with peripheral vascular disease ([place]) 10/09/2016  . PVD (peripheral vascular disease) ([place]) 10/09/2016  . Essential hypertension 10/09/2016  . Alzheimer's dementia 10/09/2016  . Chronic depression 10/09/2016  . Hyperlipidemia 10/09/2016  . Coronary artery disease involving native coronary artery of native heart without angina pectoris 10/09/2016    Past Surgical History:  Procedure Laterality Date  . BELOW KNEE LEG AMPUTATION Right   . PACEMAKER IMPLANT       OB History   None      Home Medications    Prior to Admission medications   Medication Sig Start Date End Date Taking? Authorizing Provider  carvedilol (COREG) 12.5 MG tablet Take 12.5 mg by mouth 2 (two) times daily with  a meal. Hold for SBP<110 and/or pulse <60/min   Yes [provider]  ferrous sulfate 325 (65 FE) MG tablet Take 325 mg by mouth 2 (two) times daily with a meal.   Yes [provider]  levETIRAcetam (KEPPRA) 500 MG tablet Take 500 mg by mouth 2 (two) times daily.   Yes [provider]  levothyroxine (SYNTHROID, LEVOTHROID) 100 MCG tablet Take 100 mcg by mouth daily before breakfast.   Yes [provider]    magnesium oxide (MAG-OX) 400 MG tablet Take 400 mg by mouth daily.   Yes [provider]  mirtazapine (REMERON) 15 MG tablet Take 15 mg by mouth at bedtime.   Yes [provider]  Multiple Vitamins-Minerals (DECUBI-VITE PO) Take 1 capsule by mouth daily.   Yes [provider]  ondansetron (ZOFRAN) 4 MG tablet Take 4 mg by mouth every 6 (six) hours as needed for nausea or vomiting.   Yes [provider]  ondansetron (ZOFRAN) 4 MG tablet Take 4 mg by mouth every 6 (six) hours as needed for nausea or vomiting.   Yes [provider]  pantoprazole (PROTONIX) 40 MG tablet Take 1 tablet (40 mg total) by mouth daily. 07/25/17  Yes Barton Dubois, MD  polyethylene glycol [facility] / GLYCOLAX) packet Take 17 g by mouth daily.   Yes [provider]  potassium chloride SA (K-DUR,KLOR-CON) 20 MEQ tablet Take 20 mEq by mouth daily.   Yes [provider]  sennosides-docusate sodium (SENOKOT-S) 8.6-50 MG tablet Take 2 tablets by mouth 2 (two) times daily.   Yes [provider]  sertraline (ZOLOFT) 50 MG tablet Take 75 mg by mouth daily.   Yes [provider]  feeding supplement, ENSURE ENLIVE, (ENSURE ENLIVE) LIQD Take 237 mLs by mouth 3 (three) times daily between meals. Patient not taking: Reported on 11/03/2017 10/10/17   Yaakov Guthrie, MD  furosemide (LASIX) 20 MG tablet Take 1 tablet (20 mg total) by mouth daily. Patient not taking: Reported on 12/12/2017 11/14/17 11/14/18  Jani Gravel, MD  nystatin (MYCOSTATIN) 100000 UNIT/ML suspension Take 5 mLs (500,000 Units total) by mouth 4 (four) times daily. Patient not taking: Reported on 12/12/2017 11/14/17   Jani Gravel, MD    Family History Family History  Family history unknown: Yes    Social History Social History   Tobacco Use  . Smoking status: Never Smoker  . Smokeless tobacco: Never Used  Substance Use Topics  . Alcohol use: No  . Drug use: No     Allergies    Acetaminophen; Codeine; Dilaudid [hydromorphone]; Hydrocodone; and Keflex [cephalexin]   Review of Systems Review of Systems  All other systems reviewed and are negative.    Physical Exam Updated Vital Signs BP 137/61   Pulse 78   Temp 97.8 F (36.6 C) (Oral)   Resp 14   Ht 5' 4"  (1.626 m)   Wt 50.8 kg (112 lb)   SpO2 100%   BMI 19.22 kg/m   Physical Exam  Constitutional: She appears well-developed and well-nourished.  HENT:  Head: Normocephalic and atraumatic.  Cardiovascular: Normal rate and regular rhythm.  No murmur heard. Pulmonary/Chest: Effort normal. No respiratory distress.  Crackles in bilateral bases.    Abdominal: Soft. There is no tenderness. There is no rebound and no guarding.  Genitourinary:  Genitourinary Comments: Dressed wound to sacrum.  Nontender rectal exam, no gross blood, brown stool in rectal vault.    Musculoskeletal:  Right BKA.    Neurological:  Profound generalized weakness. Disoriented to place in time. Drowsy but arousable.  Skin: Skin is warm and dry. There is pallor.  Psychiatric: She has a normal mood and affect. Her behavior is normal.  Nursing note and vitals reviewed.    ED Treatments / Results  Labs (all labs ordered are listed, but only abnormal results are displayed) Labs Reviewed  COMPREHENSIVE METABOLIC PANEL - Abnormal; Notable for the following components:      Result Value   Potassium 5.6 (*)    Calcium 8.7 (*)    Total Protein 5.4 (*)    Albumin 2.3 (*)    AST 56 (*)    Anion gap 3 (*)    All other components within normal limits  CBC WITH DIFFERENTIAL/PLATELET - Abnormal; Notable for the following components:   RBC 1.41 (*)    Hemoglobin 4.6 (*)    HCT 14.4 (*)    MCV 102.1 (*)    RDW 18.1 (*)    Platelets 146 (*)    All other components within normal limits  URINALYSIS, ROUTINE W REFLEX MICROSCOPIC - Abnormal; Notable for the following components:   Leukocytes, UA SMALL (*)    Bacteria, UA RARE (*)     All other components within normal limits  PROTIME-INR - Abnormal; Notable for the following components:   Prothrombin Time 15.5 (*)    All other components within normal limits  CBC WITH DIFFERENTIAL/PLATELET - Abnormal; Notable for the following components:   RBC 2.40 (*)    Hemoglobin 7.8 (*)    HCT 24.7 (*)    MCV 102.9 (*)    RDW 17.8 (*)    Platelets 124 (*)    All other components within normal limits  I-STAT CHEM 8, ED - Abnormal; Notable for the following components:   Potassium 5.6 (*)    BUN 22 (*)    Hemoglobin 7.5 (*)    HCT 22.0 (*)    All other components within normal limits  URINE CULTURE  AMMONIA  BASIC METABOLIC PANEL  CBC  CBG MONITORING, ED  I-STAT TROPONIN, ED  POC OCCULT BLOOD, ED  TYPE AND SCREEN  ABO/RH  PREPARE RBC (CROSSMATCH)    EKG EKG Interpretation  Date/Time:  Monday December 29 2017 14:28:03 EDT Ventricular Rate:  79 PR Interval:    QRS Duration: 149 QT Interval:  427 QTC Calculation: 490 R Axis:   59 Text Interpretation:  Electronic ventricular pacemaker Left bundle branch block Baseline wander in lead(s) V4 Confirmed by Quintella Reichert [phone]) on 12/29/2017 3:31:50 PM   Radiology Ct Head Wo Contrast  Result Date: 12/29/2017 CLINICAL DATA:  84 year old female with altered mental status. Alzheimer's dementia. Initial encounter. EXAM: CT HEAD WITHOUT CONTRAST TECHNIQUE: Contiguous axial images were obtained from the base of the skull through the vertex without intravenous contrast. COMPARISON:  11/06/2017 CT. FINDINGS: Brain: No intracranial hemorrhage or CT evidence of large acute infarct. Remote right parietal-occipital lobe infarct. Chronic microvascular changes. Moderate global atrophy. No intracranial mass lesion noted on this unenhanced exam. Vascular: Vascular calcifications. Skull: Negative Sinuses/Orbits: No acute orbital abnormality. Visualized paranasal sinuses are clear. Other: Minimal partial opacification inferior right mastoid  air cells. Remainder of mastoid air cells and middle ear cavities are clear. IMPRESSION: No acute intracranial abnormality. Remote right parietal lobe infarct. Moderate chronic microvascular changes. Moderate global atrophy. Electronically Signed   By: Genia Del M.D.   On: 12/29/2017 18:46   Dg Chest Port 1 View  Result Date: 12/29/2017 CLINICAL DATA:  Altered mental status. EXAM: PORTABLE CHEST 1 VIEW COMPARISON:  Radiographs of Nov 12, 2017. FINDINGS: Stable cardiomegaly. Left-sided pacemaker is unchanged in position. No pneumothorax is noted. Stable bibasilar edema or atelectasis is noted with associated pleural effusions. Bony thorax is unremarkable. IMPRESSION: Stable bibasilar atelectasis or edema is noted with associated pleural effusions. Electronically Signed   By: Marijo Conception, M.D.   On: 12/29/2017 15:47    Procedures Procedures (including critical care time) CRITICAL CARE Performed by: Quintella Reichert   Total critical care time: 35 minutes  Critical care time was exclusive of separately billable procedures and treating other patients.  Critical care was necessary to treat or prevent imminent or life-threatening deterioration.  Critical care was time spent personally by me on the following activities: development of treatment plan with patient and/or surrogate as well as nursing, discussions with consultants, evaluation of patient's response to treatment, examination of patient, obtaining history from patient or surrogate, ordering and performing treatments and interventions, ordering and review of laboratory studies, ordering and review of radiographic studies, pulse oximetry and re-evaluation of patient's condition.   Medications Ordered in ED Medications  carvedilol (COREG) tablet 12.5 mg (has no administration in time range)  ferrous sulfate tablet 325 mg (has no administration in time range)  levETIRAcetam (KEPPRA) tablet 500 mg (has no administration in time range)    levothyroxine (SYNTHROID, LEVOTHROID) tablet 100 mcg (has no administration in time range)  magnesium oxide (MAG-OX) tablet 400 mg (has no administration in time range)  pantoprazole (PROTONIX) EC tablet 40 mg (has no administration in time range)  polyethylene glycol (MIRALAX / GLYCOLAX) packet 17 g (has no administration in time range)  potassium chloride SA (K-DUR,KLOR-CON) CR tablet 20 mEq (has no administration in time range)  senna-docusate (Senokot-S) tablet 2 tablet (has no administration in time range)  sertraline (ZOLOFT) tablet 75 mg (has no administration in time range)  mirtazapine (REMERON) tablet 15 mg (has no administration in time range)  ondansetron (ZOFRAN) tablet 4 mg (has no administration in time range)    Or  ondansetron (ZOFRAN) injection 4 mg (has no administration in time range)  enoxaparin (LOVENOX) injection 40 mg (has no administration in time range)  cefTRIAXone (ROCEPHIN) 1 g in sodium chloride 0.9 % 100 mL IVPB (has no administration in time range)  0.9 %  sodium chloride infusion ( Intravenous New Bag/Given 12/29/17 2034)     Initial Impression / Assessment and Plan / ED Course  I have reviewed the triage vital signs and the nursing notes.  Pertinent labs & imaging results that were available during my care of the patient were reviewed by me and considered in my medical decision making (see chart for details).     Patient with history of recurrent anemia as well as dementia here for evaluation of altered mental status and concerns for recurrent anemia. Initial I stat chem 8 with hemoglobin of 7.2. Initial CBC with hemoglobin of 4.6, which is not consistent with initial blood results. Repeat CBC obtained, which is 7.8. Given reports of worsening mental status, lethargy as well as reports of chest and body pain per son will provide transfusion for symptom management.  Medicine consulted for admission.    Final Clinical Impressions(s) / ED Diagnoses   Final  diagnoses:  None    ED Discharge Orders    None       Quintella Reichert, MD 12/29/17 2306    Quintella Reichert, MD 01/[birth year]/18 [phone]

## 2017-12-29 NOTE — ED Notes (Signed)
This RN attempted Iv access x2 without success, Iv team consult to be ordered

## 2017-12-29 NOTE — ED Notes (Signed)
Dr Ralene Bathe notified of critical hgb

## 2017-12-30 ENCOUNTER — Observation Stay (HOSPITAL_BASED_OUTPATIENT_CLINIC_OR_DEPARTMENT_OTHER): Payer: Medicare Other

## 2017-12-30 DIAGNOSIS — N390 Urinary tract infection, site not specified: Secondary | ICD-10-CM

## 2017-12-30 DIAGNOSIS — F0280 Dementia in other diseases classified elsewhere without behavioral disturbance: Secondary | ICD-10-CM | POA: Diagnosis not present

## 2017-12-30 DIAGNOSIS — I10 Essential (primary) hypertension: Secondary | ICD-10-CM

## 2017-12-30 DIAGNOSIS — I2510 Atherosclerotic heart disease of native coronary artery without angina pectoris: Secondary | ICD-10-CM | POA: Diagnosis not present

## 2017-12-30 DIAGNOSIS — E039 Hypothyroidism, unspecified: Secondary | ICD-10-CM

## 2017-12-30 DIAGNOSIS — R609 Edema, unspecified: Secondary | ICD-10-CM | POA: Diagnosis not present

## 2017-12-30 DIAGNOSIS — G309 Alzheimer's disease, unspecified: Secondary | ICD-10-CM | POA: Diagnosis not present

## 2017-12-30 DIAGNOSIS — R4182 Altered mental status, unspecified: Secondary | ICD-10-CM | POA: Diagnosis not present

## 2017-12-30 DIAGNOSIS — E1151 Type 2 diabetes mellitus with diabetic peripheral angiopathy without gangrene: Secondary | ICD-10-CM | POA: Diagnosis not present

## 2017-12-30 DIAGNOSIS — G9340 Encephalopathy, unspecified: Secondary | ICD-10-CM

## 2017-12-30 DIAGNOSIS — E440 Moderate protein-calorie malnutrition: Secondary | ICD-10-CM

## 2017-12-30 DIAGNOSIS — D649 Anemia, unspecified: Secondary | ICD-10-CM | POA: Diagnosis not present

## 2017-12-30 LAB — CBC
HEMATOCRIT: 27.4 % — AB (ref 36.0–46.0)
Hemoglobin: 8.9 g/dL — ABNORMAL LOW (ref 12.0–15.0)
MCH: 30.7 pg (ref 26.0–34.0)
MCHC: 32.5 g/dL (ref 30.0–36.0)
MCV: 94.5 fL (ref 78.0–100.0)
PLATELETS: 106 10*3/uL — AB (ref 150–400)
RBC: 2.9 MIL/uL — ABNORMAL LOW (ref 3.87–5.11)
RDW: 20.4 % — AB (ref 11.5–15.5)
WBC: 6 10*3/uL (ref 4.0–10.5)

## 2017-12-30 LAB — BASIC METABOLIC PANEL
Anion gap: 5 (ref 5–15)
BUN: 17 mg/dL (ref 6–20)
CALCIUM: 9 mg/dL (ref 8.9–10.3)
CO2: 29 mmol/L (ref 22–32)
CREATININE: 0.57 mg/dL (ref 0.44–1.00)
Chloride: 105 mmol/L (ref 101–111)
GFR calc Af Amer: >60 mL/min (ref >60)
GLUCOSE: 83 mg/dL (ref 65–99)
POTASSIUM: 5.1 mmol/L (ref 3.5–5.1)
Sodium: 139 mmol/L (ref 135–145)

## 2017-12-30 LAB — TYPE AND SCREEN
ABO/RH(D): A POS
Antibody Screen: NEGATIVE
Unit division: 0

## 2017-12-30 LAB — BPAM RBC
BLOOD PRODUCT EXPIRATION DATE: 201906252359
ISSUE DATE / TIME: 201906172026
Unit Type and Rh: 6200

## 2017-12-30 LAB — MRSA PCR SCREENING: MRSA by PCR: POSITIVE — AB

## 2017-12-30 MED ORDER — FUROSEMIDE 20 MG PO TABS
20.0000 mg | ORAL_TABLET | Freq: Once | ORAL | Status: AC
  Administered 2017-12-30: 20 mg via ORAL
  Filled 2017-12-30: qty 1

## 2017-12-30 MED ORDER — CHLORHEXIDINE GLUCONATE CLOTH 2 % EX PADS
6.0000 | MEDICATED_PAD | Freq: Every day | CUTANEOUS | Status: DC
  Administered 2017-12-30: 6 via TOPICAL

## 2017-12-30 MED ORDER — SULFAMETHOXAZOLE-TRIMETHOPRIM 800-160 MG PO TABS: 1.0000 | ORAL_TABLET | Freq: Two times a day (BID) | ORAL | Status: AC

## 2017-12-30 MED ORDER — MUPIROCIN 2 % EX OINT
TOPICAL_OINTMENT | Freq: Two times a day (BID) | CUTANEOUS | Status: DC
  Administered 2017-12-30: 11:00:00 via NASAL
  Filled 2017-12-30: qty 22

## 2017-12-30 NOTE — Clinical Social Work Note (Signed)
Clinical Social Work Assessment  Patient Details  Name: Jean Hodges MRN: [id number] Date of Birth: 28-May-1934  Date of referral:  12/30/17               Reason for consult:  Discharge Planning                Permission sought to share information with:  Family Supports Permission granted to share information::  Yes, Verbal Permission Granted  Name::     Jimi Giza  Agency::  [name]  Relationship::  [phone]  Contact Information:  son  Housing/Transportation Living arrangements for the past 2 months:  [place] of Information:  Adult Children Patient Interpreter Needed:  None Criminal Activity/Legal Involvement Pertinent to Current Situation/Hospitalization:    Significant Relationships:  Adult Children Lives with:  Facility Resident Do you feel safe going back to the place where you live?  Yes Need for family participation in patient care:  Yes (Comment)  Care giving concerns:  No family at bedside. Patient is from [facility] and has been at facility since last April per son. Son stated he shares POA with sister. Son stated he will e-mail copy of POA to CSW to place on patient charts   Social Worker assessment / plan: Patients son Delfino Lovett would like CSW to return to [name] since patients sister is at facility. CSW reached out to [facility] and they stated they ill be able to take patient once medically cleared by MD.  Employment status:  Retired Forensic scientist:  Medicare PT Recommendations:  Not assessed at this time Information / Referral to community resources:  Miguel Barrera  Patient/Family's Response to care:  Delfino Lovett stated his goal for patient is for her to increase her nutrition intake and gain weight.  Patient/Family's Understanding of and Emotional Response to Diagnosis, Current Treatment, and Prognosis:  Family supportive of patient and needs. Richard wanting patient to recover   Emotional  Assessment Appearance:  Appears stated age Attitude/Demeanor/Rapport:  Unable to Assess Affect (typically observed):  Unable to Assess Orientation:  Oriented to Self Alcohol / Substance use:    Psych involvement (Current and /or in the community):     Discharge Needs  Concerns to be addressed:  Decision making concerns, Care Coordination Readmission within the last 30 days:  Yes Current discharge risk:  None Barriers to Discharge:  Continued Medical Work up   [employer], LCSW 12/30/2017, 12:24 PM

## 2017-12-30 NOTE — Progress Notes (Signed)
Patient picked up for transport to [facility] via [place].  Patient sent with her pajama top that was with her on admission.

## 2017-12-30 NOTE — Progress Notes (Addendum)
Preliminary notes--Left  Lower extremity venous duplex exam completed. Negative for DVT, however, calf veins not well visualized, exam cannot completed exclude calf vein thrombsis. Incidental finding: Noticeable bilateral lower extremity arteries calcification.   Jean Hodges (RDMS RVT) 12/30/17 1:47 PM

## 2017-12-30 NOTE — Progress Notes (Signed)
Dr. Bonner Puna confirmed he spoke with patient's son today and plan is to return to [facility] on po antibiotics for 2 days.   Report called to [place], Therapist, sports, at [name]  AVS with next doses due for medications entered and placed in discharge envelope, along with Patient Demographics sheet placed by Caryl Pina, SW.

## 2017-12-30 NOTE — Progress Notes (Signed)
Received call from lab that MRSA swab was positive.  Patient placed on contact precautions.

## 2017-12-30 NOTE — Progress Notes (Signed)
Clinical Social Worker facilitated patient discharge including contacting patient family and facility to confirm patient discharge plans.  Clinical information faxed to facility and family agreeable with plan.  CSW arranged ambulance transport via [facility] to [facility] .  RN to call [phone] (pt will go in room 1001B) report prior to discharge.  Clinical Social Worker will sign off for now as social work intervention is no longer needed. Please consult [place] again if new need arises.  Rhea Pink, MSW, [employer] [facility]

## 2017-12-30 NOTE — Progress Notes (Signed)
PALLIATIVE CARE CONSULT VISIT   PATIENT NAME: Jean Hodges DOB: 1933/10/18 MRN: [id number]     REFERRING PROVIDER:      Dr. Theodis Blaze, PA  RESPONSIBLE PARTY:   Asiyah Pineau)  [phone]   RECOMMENDATIONS and PLAN:  1.Severe protein-calorie malnutrition:  No improvement. Cachectic. Increased weakness.  Varied intake during feedings.  Continue feedings and hydration.  Hospice appropriate.  2. Memory Loss R41.3:  FAST 7c.  Cognitive decline.  Less verbal.  Requiring additional assistance.  Supportive care  3. Palliative care encounter  Z51.5:   Long discussion with son via phone with updates re to pt's poor state of health and her trajectory of decline.  Son still desires full scope of treatment and is awaiting consult with GI in reference to possible PEG tube placement.  He also wants her to continue to receive blood transfusions as needed and "does not understand why she still looks pale and ill after receiving blood"(5/31).  Reviewed chronic illnesses with son and encouraged him to reflect upon discussion of Oncologist.  Comfort/Hospice care services recommended to son again which were declined at this time.  Palliative care will continue to follow and readdress after appt. With GI later this month.  I spent 45 minutes providing this consultation at [facility],  from 11:00am to 11:45am. More than 50% of the time in this consultation was spent coordinating communication with [place], facility staff and son.  HISTORY OF PRESENT ILLNESS: Follow-up with [name] who remains in a poor state of health. She received a repeat blood transfusion on 5/31 for a Hgb of 7.0. Clinical staff report increased weakness along with moaning and varied degrees of nutritional intake.  Cognitive deficit is advanced.  Pt. Is unable to make decisions for self.  She requires total care in all aspects.  CODE STATUS: FULL CODE  PPS: 30% (Nutritional intake varies  30-100%)  HOSPICE ELIGIBILITY/DIAGNOSIS: YES:  Severe protein calorie malnutrition, advanced dementia.  Declined by family  PAST MEDICAL HISTORY:  Past Medical History:  Diagnosis Date  . Acute encephalopathy   . Acute encephalopathy 09/30/2016  . Alzheimer's dementia without behavioral disturbance   . Alzheimer's dementia without behavioral disturbance 10/07/2016  . Anemia   . Anemia 01/28/2014  . Atherosclerotic PVD with ulceration ([place])   . CAD (coronary artery disease)   . CAD (coronary artery disease) 01/04/2014  . Cellulitis   . Cellulitis of left leg   . Diabetes mellitus without complication ([place])   . DM (diabetes mellitus) ([place]) 01/27/2014  . HTN (hypertension) 01/04/2014  . Hyperlipidemia   . Hypertension   . Hypoglycemia associated with type 2 diabetes mellitus ([place])   . Macrocytic anemia   . Seizures ([place]) 09/30/2016  . Tear of left rotator cuff   . UTI (urinary tract infection)   . UTI (urinary tract infection) 09/25/2016    SOCIAL HX:  Social History   Tobacco Use  . Smoking status: Never Smoker  . Smokeless tobacco: Never Used  Substance Use Topics  . Alcohol use: No    ALLERGIES:  Allergies  Allergen Reactions  . Acetaminophen   . Codeine Nausea Only  . Dilaudid [Hydromorphone]   . Hydrocodone     Hallucinations  . Keflex [Cephalexin] Diarrhea     PERTINENT MEDICATIONS:  Outpatient Encounter Medications as of 12/01/2017  Medication Sig  . carvedilol (COREG) 12.5 MG tablet Take 12.5 mg by mouth 2 (two) times daily with a meal. Hold for SBP<110 and/or pulse <60/min  .  Cholecalciferol (VITAMIN D3) 2000 units TABS Take 2,000 Units by mouth daily.  . feeding supplement, ENSURE ENLIVE, (ENSURE ENLIVE) LIQD Take 237 mLs by mouth 3 (three) times daily between meals. (Patient not taking: Reported on 11/03/2017)  . ferrous sulfate 325 (65 FE) MG tablet Take 325 mg by mouth 2 (two) times daily with a meal.  . furosemide (LASIX) 20 MG tablet Take 1 tablet  (20 mg total) by mouth daily.  Marland Kitchen levETIRAcetam (KEPPRA) 500 MG tablet Take 500 mg by mouth 2 (two) times daily.  Marland Kitchen levothyroxine (SYNTHROID, LEVOTHROID) 100 MCG tablet Take 100 mcg by mouth daily before breakfast.  . mirtazapine (REMERON) 15 MG tablet Take 15 mg by mouth at bedtime.  . Multiple Vitamins-Minerals (DECUBI-VITE PO) Take 1 capsule by mouth daily.  Marland Kitchen nystatin (MYCOSTATIN) 100000 UNIT/ML suspension Take 5 mLs (500,000 Units total) by mouth 4 (four) times daily.  . ondansetron (ZOFRAN) 4 MG tablet Take 4 mg by mouth every 6 (six) hours as needed for nausea or vomiting.  . pantoprazole (PROTONIX) 40 MG tablet Take 1 tablet (40 mg total) by mouth daily.  . polyethylene glycol (MIRALAX / GLYCOLAX) packet Take 17 g by mouth daily.  . sennosides-docusate sodium (SENOKOT-S) 8.6-50 MG tablet Take 2 tablets by mouth 2 (two) times daily.  . sertraline (ZOLOFT) 50 MG tablet Take 75 mg by mouth daily.   No facility-administered encounter medications on file as of 12/01/2017.     PHYSICAL EXAM:   General: Frail and fragile appearing elderly female resting in bed.  Cachectic.  In NAD Cardiovascular: regular rate and rhythm Pulmonary: clear ant fields.  Unlabored respirations Abdomen: soft, nontender, + bowel sounds Extremities: R AKA.  No ecchymosis or edema Skin:  Very pale in color.  Reports of sacral decub but not visualized Neurological: Somnolent.  Did not arouse with gentle stimulation.      Gonzella Lex, NP-C

## 2017-12-30 NOTE — Plan of Care (Signed)
  Problem: Activity: Goal: Risk for activity intolerance will decrease Outcome: Progressing   

## 2017-12-30 NOTE — Progress Notes (Signed)
PMT consult received and chart reviewed. Upon arrival to unit dictation room, Dr. Bonner Puna speaking with son, Delfino Lovett via telephone on plans for discharge back to SNF today. Discussed with Dr. Bonner Puna. Patient currently followed by outpatient palliative services and recommend continue outpatient palliative f/u. Thank you.  NO CHARGE  Ihor Dow, FNP-C Palliative Medicine Team  Phone: [phone] Fax: [phone]

## 2017-12-30 NOTE — Discharge Summary (Signed)
Physician Discharge Summary  [name] GNO:[id number] DOB: 1934/01/03 DOA: 12/29/2017  PCP: Patient, No Pcp Per  Admit date: 12/29/2017 Discharge date: 12/30/2017  Admitted From: [place] Disposition: [place]   Recommendations for Outpatient Follow-up:  1. Follow up with PCP in 1-2 weeks 2. Please obtain CBC within the next week and transfuse if necessary.   Home Health: N/A Equipment/Devices: None new Discharge Condition: Stable CODE STATUS: Full Diet recommendation: As tolerated   HPI: Jean Hodges is a 84 y.o. female with history of chronic systolic heart failure, paroxysmal atrial fibrillation, hypothyroidism, dementia, depression, seizure and chronic anemia and thrombocytopenia for which patient and family had declined any bone marrow evaluation and is being treated symptomatically with blood transfusions had been recently to the hospital last month first week for metabolic encephalopathy cause was not clear at the time patient also had UTI was brought to the ER after patient was found to be increasingly lethargic and appearing pale.  As per the patient's son patient gets more confused when patient becomes more anemic.  ED Course: In the ER initial hemoglobin was around 4 repeat was around 7.  Stool for occult blood was negative.  UA shows features concerning for UTI.  Patient was hallucinating in the ER.  CT head is unremarkable.  MRI brain cannot be done due to patient having pacemaker.  Since patient has anemia and family was concerned ER physician ordered 2 units of PRBC.  Ceftriaxone was started for possible UTI.  Patient admitted for further observation of mental status changes and symptomatic anemia.  Brief/Interim Summary: Mentation cleared with no further hallucinations several hours following transfusion and ceftriaxone. After discussion with her son, she is felt to be at her mental baseline. Hgb up appropriately and continues to exhibit no signs of bleeding. She is  stable for discharge back to [facility] [place].  Discharge Diagnoses:  Principal Problem:   Acute encephalopathy Active Problems:   Moderate protein-calorie malnutrition (HCC)   Hypothyroidism (acquired)   Type 2 diabetes mellitus with peripheral vascular disease ([place])   Essential hypertension   Alzheimer's dementia   Coronary artery disease involving native coronary artery of native heart without angina pectoris   Symptomatic anemia   UTI (urinary tract infection)  Acute metabolic encephalopathy on chronic dementia: Resolved acute component. Suspect due to UTI. CT with moderate global atrophy and microvascular disease with remote right parietal infarct, no acute changes. MRI contraindicated by PPM.  - Continue supportive measures, delirium precautions.   Recurrent anemia: Note replete ferritin, B12, folate levels. Negative FOBT. There is ongoing suspicion of myelodysplastic syndrome in setting of thrombocytopenia as well, though pt has declined bone marrow work up.  - Hgb improved with transfusion 7.8 > 8.9 (previousl value of 4.6 felt to be spurious).  - Recommend continued surveillance and prn transfusions as outpatient. Per son, frequency of transfusions has increased (last was 5/31).   Pyuria:  - Tolerated ceftriaxone without diarrhea, though has keflex allergy. Will discharge on bactrim x2 more days pending urine culture data.   Hyperkalemia: Resolved.  - Recheck BMP at follow up.  Chronic HFrEF: Euvolemic, was given lasix w/transfusions.  - Restart home medications.   Severe protein-calorie malnutrition: Had discussion with Son on day of discharge, who is concerned for continued weight loss. I specifically discussed that the nursing home appears to have her on good protein supplementation and remeron, though her appetite is so minimal that she is now down to 100lbs. I told him that in the absence  of other factors, this is most consistent with end of life due to dementia. Though he  balks at that diagnosis, he does agree that her cognitive performance has diminished significantly. We reviewed the CT head findings and their implications on her continued trajectory.  - Has follow up with GI as outpatient per son. I do not believe this patient would be best served by a feeding tube.   Hypothyroidism: Continue stable dose synthroid  PVD s/p right BKA: Noted  Depression:  - Continue SSRI  PAF s/p PPM:  - Continue coreg and paced rhythm.  - No anticoagulation  Discharge Instructions  Allergies as of 12/30/2017      Reactions   Acetaminophen Other (See Comments)   Unknown   Codeine Nausea Only   Dilaudid [hydromorphone] Nausea Only   Hydrocodone Other (See Comments)   Hallucinations   Keflex [cephalexin] Diarrhea      Medication List    TAKE these medications   carvedilol 12.5 MG tablet Commonly known as:  COREG Take 12.5 mg by mouth 2 (two) times daily with a meal. Hold for SBP<110 and/or pulse <60/min   DECUBI-VITE PO Take 1 capsule by mouth daily.   feeding supplement (ENSURE ENLIVE) Liqd Take 237 mLs by mouth 3 (three) times daily between meals.   ferrous sulfate 325 (65 FE) MG tablet Take 325 mg by mouth 2 (two) times daily with a meal.   furosemide 20 MG tablet Commonly known as:  LASIX Take 1 tablet (20 mg total) by mouth daily.   levETIRAcetam 500 MG tablet Commonly known as:  KEPPRA Take 500 mg by mouth 2 (two) times daily.   levothyroxine 100 MCG tablet Commonly known as:  SYNTHROID, LEVOTHROID Take 100 mcg by mouth daily before breakfast.   magnesium oxide 400 MG tablet Commonly known as:  MAG-OX Take 400 mg by mouth daily.   mirtazapine 15 MG tablet Commonly known as:  REMERON Take 15 mg by mouth at bedtime.   nystatin 100000 UNIT/ML suspension Commonly known as:  MYCOSTATIN Take 5 mLs (500,000 Units total) by mouth 4 (four) times daily.   ondansetron 4 MG tablet Commonly known as:  ZOFRAN Take 4 mg by mouth every 6 (six)  hours as needed for nausea or vomiting.   ondansetron 4 MG tablet Commonly known as:  ZOFRAN Take 4 mg by mouth every 6 (six) hours as needed for nausea or vomiting.   pantoprazole 40 MG tablet Commonly known as:  PROTONIX Take 1 tablet (40 mg total) by mouth daily.   polyethylene glycol packet Commonly known as:  MIRALAX / GLYCOLAX Take 17 g by mouth daily.   potassium chloride SA 20 MEQ tablet Commonly known as:  K-DUR,KLOR-CON Take 20 mEq by mouth daily.   sennosides-docusate sodium 8.6-50 MG tablet Commonly known as:  SENOKOT-S Take 2 tablets by mouth 2 (two) times daily.   sertraline 50 MG tablet Commonly known as:  ZOLOFT Take 75 mg by mouth daily.   sulfamethoxazole-trimethoprim 800-160 MG tablet Commonly known as:  BACTRIM DS,SEPTRA DS Take 1 tablet by mouth 2 (two) times daily for 2 days.      Follow-up Information    Place, [facility] Follow up.   Specialty:  Skilled Nursing Facility Contact information: [address] [place] [address] [phone]          Allergies  Allergen Reactions  . Acetaminophen Other (See Comments)    Unknown  . Codeine Nausea Only  . Dilaudid [Hydromorphone] Nausea Only  .  Hydrocodone Other (See Comments)    Hallucinations  . Keflex [Cephalexin] Diarrhea    Consultations:  None  Procedures/Studies: Ct Head Wo Contrast  Result Date: 12/29/2017 CLINICAL DATA:  84 year old female with altered mental status. Alzheimer's dementia. Initial encounter. EXAM: CT HEAD WITHOUT CONTRAST TECHNIQUE: Contiguous axial images were obtained from the base of the skull through the vertex without intravenous contrast. COMPARISON:  11/06/2017 CT. FINDINGS: Brain: No intracranial hemorrhage or CT evidence of large acute infarct. Remote right parietal-occipital lobe infarct. Chronic microvascular changes. Moderate global atrophy. No intracranial mass lesion noted on this unenhanced exam. Vascular: Vascular calcifications. Skull: Negative  Sinuses/Orbits: No acute orbital abnormality. Visualized paranasal sinuses are clear. Other: Minimal partial opacification inferior right mastoid air cells. Remainder of mastoid air cells and middle ear cavities are clear. IMPRESSION: No acute intracranial abnormality. Remote right parietal lobe infarct. Moderate chronic microvascular changes. Moderate global atrophy. Electronically Signed   By: Genia Del M.D.   On: 12/29/2017 18:46   Dg Chest Port 1 View  Result Date: 12/29/2017 CLINICAL DATA:  Altered mental status. EXAM: PORTABLE CHEST 1 VIEW COMPARISON:  Radiographs of Nov 12, 2017. FINDINGS: Stable cardiomegaly. Left-sided pacemaker is unchanged in position. No pneumothorax is noted. Stable bibasilar edema or atelectasis is noted with associated pleural effusions. Bony thorax is unremarkable. IMPRESSION: Stable bibasilar atelectasis or edema is noted with associated pleural effusions. Electronically Signed   By: Marijo Conception, M.D.   On: 12/29/2017 15:47    Subjective: Feels "fine." No trouble breathing or lightheadedness. No chest pain or palpitations.   Discharge Exam: Vitals:   12/30/17 0544 12/30/17 1203  BP: (!) 127/56 (!) 108/46  Pulse: 77 81  Resp: 18 18  Temp: 97.8 F (36.6 C) 98 F (36.7 C)  SpO2: 98% 98%   General: Pt is alert, awake, not oriented but conversant without aphasia and in acute distress Cardiovascular: RRR, S1/S2 +, no rubs, no gallops. No LE edema Respiratory: CTA bilaterally, no wheezing, no crackles Abdominal: Soft, NT, ND, bowel sounds + Extremities: Right BKA site with foam dressing, bilateral prevalon boots in place for offloading  Labs: Basic Metabolic Panel: Recent Labs  Lab 12/29/17 1455 12/29/17 1543 12/30/17 0526  NA 138 138 139  K 5.6* 5.6* 5.1  CL 102 105 105  CO2  --  30 29  GLUCOSE 81 84 83  BUN 22* 16 17  CREATININE 0.60 0.60 0.57  CALCIUM  --  8.7* 9.0   Liver Function Tests: Recent Labs  Lab 12/29/17 1543  AST 56*  ALT  44  ALKPHOS 113  BILITOT 1.0  PROT 5.4*  ALBUMIN 2.3*    Recent Labs  Lab 12/29/17 2029  AMMONIA 25   CBC: Recent Labs  Lab 12/29/17 1455 12/29/17 1543 12/29/17 1716 12/30/17 0526  WBC  --  7.5 6.1 6.0  NEUTROABS  --  4.3 3.7  --   HGB 7.5* 4.6* 7.8* 8.9*  HCT 22.0* 14.4* 24.7* 27.4*  MCV  --  102.1* 102.9* 94.5  PLT  --  146* 124* 106*   Urinalysis    Component Value Date/Time   COLORURINE YELLOW 12/29/2017 1922   APPEARANCEUR CLEAR 12/29/2017 1922   LABSPEC 1.010 12/29/2017 [place] 7.0 12/29/2017 [place] NEGATIVE 12/29/2017 [place] NEGATIVE 12/29/2017 [place] NEGATIVE 12/29/2017 [place] NEGATIVE 12/29/2017 1922   PROTEINUR NEGATIVE 12/29/2017 1922   NITRITE NEGATIVE 12/29/2017 1922   LEUKOCYTESUR SMALL (A) 12/29/2017  1922    Microbiology Recent Results (from the past 240 hour(s))  MRSA PCR Screening     Status: Abnormal   Collection Time: 12/30/17  6:34 AM  Result Value Ref Range Status   MRSA by PCR POSITIVE (A) NEGATIVE Final    Comment:        The GeneXpert MRSA Assay (FDA approved for NASAL specimens only), is one component of a comprehensive MRSA colonization surveillance program. It is not intended to diagnose MRSA infection nor to guide or monitor treatment for MRSA infections. RESULT CALLED TO, READ BACK BY AND VERIFIED WITH: RN Evette Georges [phone] Performed at [facility], [place] [address], [place],  [address]     Time coordinating discharge: Approximately 40 minutes  Patrecia Pour, MD  [facility] 12/30/2017, 2:11 PM Pager [phone]

## 2017-12-30 NOTE — Progress Notes (Addendum)
Palliative Medicine RN Note: Consult order noted. Patient is active with outpt palliative care through [facility]. Per their NP Jean Hodges, pt's POA is her son, but I do not see a copy of an HCPOA form on her chart.  Jean Hodges reports that they have had multiple conversations with her son, who continues to want every aggressive option offered, but her daughter does not want to see her suffer. Should decision making be needed, PMT recommends obtaining a copy of the HCPOA form; in the absence of this form, we would need to follow the law for who would make decisions. In the absence of parents or a spouse, it is the majority of reasonably available children.  PMT will see Jean Hodges as soon as we have an available provider, and I will call the facility to try to locate a copy of the Jean Hodges.   Jean Hodges Jean Zerby, RN, BSN, [facility] Palliative Medicine Team 12/30/2017 9:32 AM Office [phone]    ADDENDUM: Jean Hodges with Jean Hodges, who will fax HCPOA. They faxed a MOST form, not a POA form. I will continue to pursue the correct form. SNF did not have a copy. Need son to bring a copy when meeting is scheduled.  Jean Hodges Jean Brandow, RN, BSN, [facility] Palliative Medicine Team 12/30/2017 10:26 AM Office [phone]

## 2017-12-30 NOTE — Progress Notes (Signed)
I entered Airborne Precautions by mistake.  Had to enter the precautions, then discontinue, with a note that this was entered by mistake.

## 2017-12-31 LAB — URINE CULTURE

## 2018-01-16 ENCOUNTER — Inpatient Hospital Stay (HOSPITAL_COMMUNITY)
Admission: EM | Admit: 2018-01-16 | Discharge: 2018-01-19 | DRG: 689 | Disposition: A | Discharge status: Skilled Nursing Facility | Payer: Medicare Other | Source: Skilled Nursing Facility | Attending: Internal Medicine | Admitting: Internal Medicine

## 2018-01-16 ENCOUNTER — Encounter (HOSPITAL_COMMUNITY): Payer: Self-pay | Admitting: Internal Medicine

## 2018-01-16 ENCOUNTER — Emergency Department (HOSPITAL_COMMUNITY): Payer: Medicare Other

## 2018-01-16 ENCOUNTER — Non-Acute Institutional Stay: Payer: Medicare Other | Admitting: Internal Medicine

## 2018-01-16 ENCOUNTER — Other Ambulatory Visit: Payer: Self-pay

## 2018-01-16 DIAGNOSIS — I2510 Atherosclerotic heart disease of native coronary artery without angina pectoris: Secondary | ICD-10-CM | POA: Diagnosis present

## 2018-01-16 DIAGNOSIS — F0280 Dementia in other diseases classified elsewhere without behavioral disturbance: Secondary | ICD-10-CM | POA: Diagnosis present

## 2018-01-16 DIAGNOSIS — E43 Unspecified severe protein-calorie malnutrition: Secondary | ICD-10-CM | POA: Diagnosis present

## 2018-01-16 DIAGNOSIS — Z950 Presence of cardiac pacemaker: Secondary | ICD-10-CM

## 2018-01-16 DIAGNOSIS — R195 Other fecal abnormalities: Secondary | ICD-10-CM

## 2018-01-16 DIAGNOSIS — R1084 Generalized abdominal pain: Secondary | ICD-10-CM | POA: Diagnosis not present

## 2018-01-16 DIAGNOSIS — Z7189 Other specified counseling: Secondary | ICD-10-CM

## 2018-01-16 DIAGNOSIS — Z886 Allergy status to analgesic agent status: Secondary | ICD-10-CM

## 2018-01-16 DIAGNOSIS — D469 Myelodysplastic syndrome, unspecified: Secondary | ICD-10-CM | POA: Diagnosis present

## 2018-01-16 DIAGNOSIS — I110 Hypertensive heart disease with heart failure: Secondary | ICD-10-CM | POA: Diagnosis present

## 2018-01-16 DIAGNOSIS — R413 Other amnesia: Secondary | ICD-10-CM

## 2018-01-16 DIAGNOSIS — Z79890 Hormone replacement therapy (postmenopausal): Secondary | ICD-10-CM

## 2018-01-16 DIAGNOSIS — N390 Urinary tract infection, site not specified: Secondary | ICD-10-CM | POA: Diagnosis not present

## 2018-01-16 DIAGNOSIS — E1151 Type 2 diabetes mellitus with diabetic peripheral angiopathy without gangrene: Secondary | ICD-10-CM | POA: Diagnosis present

## 2018-01-16 DIAGNOSIS — D649 Anemia, unspecified: Secondary | ICD-10-CM | POA: Diagnosis present

## 2018-01-16 DIAGNOSIS — Z79899 Other long term (current) drug therapy: Secondary | ICD-10-CM

## 2018-01-16 DIAGNOSIS — E039 Hypothyroidism, unspecified: Secondary | ICD-10-CM | POA: Diagnosis present

## 2018-01-16 DIAGNOSIS — G40909 Epilepsy, unspecified, not intractable, without status epilepticus: Secondary | ICD-10-CM | POA: Diagnosis present

## 2018-01-16 DIAGNOSIS — F329 Major depressive disorder, single episode, unspecified: Secondary | ICD-10-CM | POA: Diagnosis present

## 2018-01-16 DIAGNOSIS — Z881 Allergy status to other antibiotic agents status: Secondary | ICD-10-CM

## 2018-01-16 DIAGNOSIS — Z681 Body mass index (BMI) 19 or less, adult: Secondary | ICD-10-CM

## 2018-01-16 DIAGNOSIS — R109 Unspecified abdominal pain: Secondary | ICD-10-CM | POA: Diagnosis not present

## 2018-01-16 DIAGNOSIS — I10 Essential (primary) hypertension: Secondary | ICD-10-CM | POA: Diagnosis present

## 2018-01-16 DIAGNOSIS — I5022 Chronic systolic (congestive) heart failure: Secondary | ICD-10-CM | POA: Diagnosis present

## 2018-01-16 DIAGNOSIS — I480 Paroxysmal atrial fibrillation: Secondary | ICD-10-CM | POA: Diagnosis present

## 2018-01-16 DIAGNOSIS — G309 Alzheimer's disease, unspecified: Secondary | ICD-10-CM | POA: Diagnosis present

## 2018-01-16 DIAGNOSIS — Z885 Allergy status to narcotic agent status: Secondary | ICD-10-CM

## 2018-01-16 DIAGNOSIS — D539 Nutritional anemia, unspecified: Secondary | ICD-10-CM | POA: Diagnosis present

## 2018-01-16 DIAGNOSIS — D696 Thrombocytopenia, unspecified: Secondary | ICD-10-CM | POA: Diagnosis present

## 2018-01-16 DIAGNOSIS — L8990 Pressure ulcer of unspecified site, unspecified stage: Secondary | ICD-10-CM | POA: Diagnosis present

## 2018-01-16 DIAGNOSIS — R64 Cachexia: Secondary | ICD-10-CM | POA: Diagnosis present

## 2018-01-16 DIAGNOSIS — Z89511 Acquired absence of right leg below knee: Secondary | ICD-10-CM

## 2018-01-16 DIAGNOSIS — E785 Hyperlipidemia, unspecified: Secondary | ICD-10-CM | POA: Diagnosis present

## 2018-01-16 DIAGNOSIS — E875 Hyperkalemia: Secondary | ICD-10-CM | POA: Diagnosis not present

## 2018-01-16 LAB — LIPASE, BLOOD: LIPASE: 26 U/L (ref 11–51)

## 2018-01-16 LAB — URINALYSIS, ROUTINE W REFLEX MICROSCOPIC
Bilirubin Urine: NEGATIVE
GLUCOSE, UA: NEGATIVE mg/dL
Hgb urine dipstick: NEGATIVE
Ketones, ur: NEGATIVE mg/dL
Nitrite: POSITIVE — AB
PROTEIN: NEGATIVE mg/dL
Specific Gravity, Urine: 1.013 (ref 1.005–1.030)
pH: 7 (ref 5.0–8.0)

## 2018-01-16 LAB — CBC WITH DIFFERENTIAL/PLATELET
BASOS ABS: 0 10*3/uL (ref 0.0–0.1)
BASOS PCT: 0 %
Eosinophils Absolute: 0.1 10*3/uL (ref 0.0–0.7)
Eosinophils Relative: 3 %
HEMATOCRIT: 21.1 % — AB (ref 36.0–46.0)
HEMOGLOBIN: 6.9 g/dL — AB (ref 12.0–15.0)
LYMPHS PCT: 24 %
Lymphs Abs: 1.2 10*3/uL (ref 0.7–4.0)
MCH: 32.2 pg (ref 26.0–34.0)
MCHC: 32.7 g/dL (ref 30.0–36.0)
MCV: 98.6 fL (ref 78.0–100.0)
MONO ABS: 0.3 10*3/uL (ref 0.1–1.0)
Monocytes Relative: 6 %
NEUTROS ABS: 3.4 10*3/uL (ref 1.7–7.7)
NEUTROS PCT: 67 %
PLATELETS: 120 10*3/uL — AB (ref 150–400)
RBC: 2.14 MIL/uL — AB (ref 3.87–5.11)
RDW: 20.3 % — AB (ref 11.5–15.5)
WBC: 5.1 10*3/uL (ref 4.0–10.5)

## 2018-01-16 LAB — COMPREHENSIVE METABOLIC PANEL
ALT: 35 U/L (ref 0–44)
AST: 31 U/L (ref 15–41)
Albumin: 2.2 g/dL — ABNORMAL LOW (ref 3.5–5.0)
Alkaline Phosphatase: 88 U/L (ref 38–126)
Anion gap: 4 — ABNORMAL LOW (ref 5–15)
BILIRUBIN TOTAL: 0.2 mg/dL — AB (ref 0.3–1.2)
BUN: 24 mg/dL — AB (ref 8–23)
CO2: 30 mmol/L (ref 22–32)
Calcium: 8.6 mg/dL — ABNORMAL LOW (ref 8.9–10.3)
Chloride: 107 mmol/L (ref 98–111)
Creatinine, Ser: 0.69 mg/dL (ref 0.44–1.00)
Glucose, Bld: 133 mg/dL — ABNORMAL HIGH (ref 70–99)
POTASSIUM: 5 mmol/L (ref 3.5–5.1)
Sodium: 141 mmol/L (ref 135–145)
TOTAL PROTEIN: 5.2 g/dL — AB (ref 6.5–8.1)

## 2018-01-16 LAB — I-STAT CG4 LACTIC ACID, ED
Lactic Acid, Venous: 0.65 mmol/L (ref 0.5–1.9)
Lactic Acid, Venous: 0.81 mmol/L (ref 0.5–1.9)

## 2018-01-16 MED ORDER — FERROUS SULFATE 325 (65 FE) MG PO TABS
325.0000 mg | ORAL_TABLET | Freq: Two times a day (BID) | ORAL | Status: DC
  Administered 2018-01-17 – 2018-01-19 (×5): 325 mg via ORAL
  Filled 2018-01-16 (×5): qty 1

## 2018-01-16 MED ORDER — IOPAMIDOL (ISOVUE-300) INJECTION 61%
INTRAVENOUS | Status: AC
  Filled 2018-01-16: qty 100

## 2018-01-16 MED ORDER — OXYCODONE HCL 5 MG PO TABS: 2.5000 mg | ORAL_TABLET | Freq: Three times a day (TID) | ORAL | Status: DC | PRN

## 2018-01-16 MED ORDER — POLYETHYLENE GLYCOL 3350 17 G PO PACK: 17.0000 g | PACK | Freq: Every day | ORAL | Status: DC

## 2018-01-16 MED ORDER — FUROSEMIDE 20 MG PO TABS
10.0000 mg | ORAL_TABLET | Freq: Every day | ORAL | Status: DC
  Administered 2018-01-17: 10 mg via ORAL
  Filled 2018-01-16: qty 1

## 2018-01-16 MED ORDER — ONDANSETRON HCL 4 MG/2ML IJ SOLN: 4.0000 mg | Freq: Four times a day (QID) | INTRAMUSCULAR | Status: DC | PRN

## 2018-01-16 MED ORDER — POTASSIUM CHLORIDE CRYS ER 20 MEQ PO TBCR
20.0000 meq | EXTENDED_RELEASE_TABLET | Freq: Every day | ORAL | Status: DC
  Administered 2018-01-17: 20 meq via ORAL
  Filled 2018-01-16: qty 1

## 2018-01-16 MED ORDER — IOPAMIDOL (ISOVUE-300) INJECTION 61%
100.0000 mL | Freq: Once | INTRAVENOUS | Status: AC | PRN
  Administered 2018-01-16: 80 mL via INTRAVENOUS

## 2018-01-16 MED ORDER — CARVEDILOL 12.5 MG PO TABS
12.5000 mg | ORAL_TABLET | Freq: Two times a day (BID) | ORAL | Status: DC
  Administered 2018-01-17 – 2018-01-19 (×5): 12.5 mg via ORAL
  Filled 2018-01-16 (×5): qty 1

## 2018-01-16 MED ORDER — LEVETIRACETAM 500 MG PO TABS
500.0000 mg | ORAL_TABLET | Freq: Two times a day (BID) | ORAL | Status: DC
Start: 2018-01-16 — End: 2018-01-19
  Administered 2018-01-17 – 2018-01-19 (×6): 500 mg via ORAL
  Filled 2018-01-16 (×6): qty 1

## 2018-01-16 MED ORDER — ENOXAPARIN SODIUM 30 MG/0.3ML ~~LOC~~ SOLN
30.0000 mg | Freq: Every day | SUBCUTANEOUS | Status: DC
  Administered 2018-01-17 – 2018-01-18 (×3): 30 mg via SUBCUTANEOUS
  Filled 2018-01-16 (×3): qty 0.3

## 2018-01-16 MED ORDER — PANTOPRAZOLE SODIUM 40 MG PO TBEC
40.0000 mg | DELAYED_RELEASE_TABLET | Freq: Every day | ORAL | Status: DC
  Administered 2018-01-17 – 2018-01-19 (×3): 40 mg via ORAL
  Filled 2018-01-16 (×3): qty 1

## 2018-01-16 MED ORDER — SODIUM CHLORIDE 0.9 % IV SOLN
1.0000 g | Freq: Once | INTRAVENOUS | Status: AC
  Administered 2018-01-16: 1 g via INTRAVENOUS
  Filled 2018-01-16: qty 10

## 2018-01-16 MED ORDER — LEVOTHYROXINE SODIUM 100 MCG PO TABS
100.0000 ug | ORAL_TABLET | Freq: Every day | ORAL | Status: DC
  Administered 2018-01-17 – 2018-01-19 (×3): 100 ug via ORAL
  Filled 2018-01-16 (×3): qty 1

## 2018-01-16 MED ORDER — SERTRALINE HCL 50 MG PO TABS
75.0000 mg | ORAL_TABLET | Freq: Every day | ORAL | Status: DC
  Administered 2018-01-17 – 2018-01-19 (×3): 75 mg via ORAL
  Filled 2018-01-16 (×3): qty 1

## 2018-01-16 MED ORDER — SODIUM CHLORIDE 0.9 % IV SOLN
10.0000 mL/h | Freq: Once | INTRAVENOUS | Status: AC
  Administered 2018-01-16: 10 mL/h via INTRAVENOUS

## 2018-01-16 MED ORDER — ONDANSETRON HCL 4 MG/2ML IJ SOLN
4.0000 mg | Freq: Once | INTRAMUSCULAR | Status: DC
  Filled 2018-01-16: qty 2

## 2018-01-16 MED ORDER — ONDANSETRON HCL 4 MG PO TABS
4.0000 mg | ORAL_TABLET | Freq: Four times a day (QID) | ORAL | Status: DC | PRN
  Filled 2018-01-16: qty 1

## 2018-01-16 MED ORDER — SENNOSIDES-DOCUSATE SODIUM 8.6-50 MG PO TABS
2.0000 | ORAL_TABLET | Freq: Two times a day (BID) | ORAL | Status: DC
Start: 2018-01-16 — End: 2018-01-19
  Administered 2018-01-17 – 2018-01-19 (×6): 2 via ORAL
  Filled 2018-01-16 (×6): qty 2

## 2018-01-16 MED ORDER — MAGNESIUM OXIDE 400 (241.3 MG) MG PO TABS
400.0000 mg | ORAL_TABLET | Freq: Every day | ORAL | Status: DC
  Administered 2018-01-17 – 2018-01-19 (×3): 400 mg via ORAL
  Filled 2018-01-16 (×3): qty 1

## 2018-01-16 NOTE — ED Notes (Signed)
Date and time results received: 01/16/18 1843 (use smartphrase ".now" to insert current time)  Test:hemoglobin Critical Value: 6.9  Name of Provider Notified: little  Orders Received? Or Actions Taken?: follow up

## 2018-01-16 NOTE — ED Notes (Signed)
ED TO INPATIENT HANDOFF REPORT  Name/Age/Gender Jean Hodges 84 y.o. female  Code Status Code Status History    Date Active Date Inactive Code Status Order ID Comments User Context   12/29/2017 2110 12/30/2017 2059 Full Code [id number]  Rise Patience, MD ED   11/03/2017 1648 11/14/2017 1942 Full Code [id number]  Kayleen Memos, DO ED   10/09/2017 2031 10/10/2017 1831 Full Code [id number]  Norval Morton, MD ED   07/23/2017 1509 07/24/2017 1809 Full Code [id number]  Phillips Grout, MD Inpatient   11/27/2016 2203 11/28/2016 2154 Full Code [id number]  Rise Patience, MD Inpatient      Home/SNF/Other Nursing Home  Chief Complaint Low Hemoglobin  Level of Care/Admitting Diagnosis ED Disposition    ED Disposition Condition [facility] Area: [facility] [[id number]]  Level of Care: Med-Surg [16]  Diagnosis: Anemia [160737]  Admitting Physician: Rise Patience [phone]  Attending Physician: Rise Patience [[id number]  PT Class (Do Not Modify): Observation [104]  PT Acc Code (Do Not Modify): Observation [10022]       Medical History Past Medical History:  Diagnosis Date  . Acute encephalopathy   . Acute encephalopathy 09/30/2016  . Alzheimer's dementia without behavioral disturbance   . Alzheimer's dementia without behavioral disturbance 10/07/2016  . Anemia   . Anemia 01/28/2014  . Atherosclerotic PVD with ulceration ([place])   . CAD (coronary artery disease)   . CAD (coronary artery disease) 01/04/2014  . Cellulitis   . Cellulitis of left leg   . CHF (congestive heart failure) ([place])   . Diabetes mellitus without complication ([place])   . DM (diabetes mellitus) ([place]) 01/27/2014  . Dysphagia   . HTN (hypertension) 01/04/2014  . Hyperlipidemia   . Hypertension   . Hypoglycemia associated with type 2 diabetes mellitus ([place])   . Macrocytic anemia   . Seizures ([place]) 09/30/2016  . Tear of left rotator cuff   . UTI (urinary tract infection)    . UTI (urinary tract infection) 09/25/2016    Allergies Allergies  Allergen Reactions  . Acetaminophen Other (See Comments)    Unknown  . Codeine Nausea Only  . Dilaudid [Hydromorphone] Nausea Only  . Hydrocodone Other (See Comments)    Hallucinations  . Keflex [Cephalexin] Diarrhea    IV Location/Drains/Wounds Patient Lines/Drains/Airways Status   Active Line/Drains/Airways    Name:   Placement date:   Placement time:   Site:   Days:   Peripheral IV 01/16/18 Left Antecubital   01/16/18    2022    Antecubital   less than 1   External Urinary Catheter   12/29/17    1748    -   18   Pressure Injury 11/03/17 Stage I -  Intact skin with non-blanchable redness of a localized area usually over a bony prominence.   11/03/17    1834     74   Pressure Injury 12/29/17 Stage II -  Partial thickness loss of dermis presenting as a shallow open ulcer with a red, pink wound bed without slough.   12/29/17    2300     18   Pressure Injury 12/29/17 Stage II -  Partial thickness loss of dermis presenting as a shallow open ulcer with a red, pink wound bed without slough.   12/29/17    2300     18          Labs/Imaging Results for orders placed or performed during the  hospital encounter of 01/16/18 (from the past 48 hour(s))  CBC with Differential     Status: Abnormal   Collection Time: 01/16/18  6:04 PM  Result Value Ref Range   WBC 5.1 4.0 - 10.5 K/uL   RBC 2.14 (L) 3.87 - 5.11 MIL/uL   Hemoglobin 6.9 (LL) 12.0 - 15.0 g/dL    Comment: CRITICAL RESULT CALLED TO, READ BACK BY AND VERIFIED WITH: KOOS,C RN [phone] [id number] COVINGTON,N    HCT 21.1 (L) 36.0 - 46.0 %   MCV 98.6 78.0 - 100.0 fL   MCH 32.2 26.0 - 34.0 pg   MCHC 32.7 30.0 - 36.0 g/dL   RDW 20.3 (H) 11.5 - 15.5 %   Platelets 120 (L) 150 - 400 K/uL   Neutrophils Relative % 67 %   Neutro Abs 3.4 1.7 - 7.7 K/uL   Lymphocytes Relative 24 %   Lymphs Abs 1.2 0.7 - 4.0 K/uL   Monocytes Relative 6 %   Monocytes Absolute 0.3 0.1 - 1.0 K/uL    Eosinophils Relative 3 %   Eosinophils Absolute 0.1 0.0 - 0.7 K/uL   Basophils Relative 0 %   Basophils Absolute 0.0 0.0 - 0.1 K/uL    Comment: Performed at [facility], [place] [address], [place], [place] [address]  Comprehensive metabolic panel     Status: Abnormal   Collection Time: 01/16/18  6:04 PM  Result Value Ref Range   Sodium 141 135 - 145 mmol/L   Potassium 5.0 3.5 - 5.1 mmol/L   Chloride 107 98 - 111 mmol/L    Comment: Please note change in reference range.   CO2 30 22 - 32 mmol/L   Glucose, Bld 133 (H) 70 - 99 mg/dL    Comment: Please note change in reference range.   BUN 24 (H) 8 - 23 mg/dL    Comment: Please note change in reference range.   Creatinine, Ser 0.69 0.44 - 1.00 mg/dL   Calcium 8.6 (L) 8.9 - 10.3 mg/dL   Total Protein 5.2 (L) 6.5 - 8.1 g/dL   Albumin 2.2 (L) 3.5 - 5.0 g/dL   AST 31 15 - 41 U/L   ALT 35 0 - 44 U/L    Comment: Please note change in reference range.   Alkaline Phosphatase 88 38 - 126 U/L   Total Bilirubin 0.2 (L) 0.3 - 1.2 mg/dL   GFR calc non Af Amer >60 >60 mL/min   GFR calc Af Amer >60 >60 mL/min    Comment: (NOTE) The eGFR has been calculated using the CKD EPI equation. This calculation has not been validated in all clinical situations. eGFR's persistently <60 mL/min signify possible Chronic Kidney Disease.    Anion gap 4 (L) 5 - 15    Comment: Performed at [facility], [place] [address], [place], [place] [address]  Lipase, blood     Status: None   Collection Time: 01/16/18  6:04 PM  Result Value Ref Range   Lipase 26 11 - 51 U/L    Comment: Performed at [facility], [place] [address], [place], [place] [address]  Type and screen [place]     Status: None   Collection Time: 01/16/18  6:15 PM  Result Value Ref Range   ABO/RH(D) A POS    Antibody Screen NEG    Sample Expiration      01/19/2018 Performed at [facility], [place]  [address], [place], [place] [address]  I-Stat CG4 Lactic Acid, ED     Status: None   Collection Time: 01/16/18  6:19 PM  Result Value Ref Range   Lactic Acid, Venous 0.65 0.5 - 1.9 mmol/L  I-Stat CG4 Lactic Acid, ED     Status: None   Collection Time: 01/16/18  8:21 PM  Result Value Ref Range   Lactic Acid, Venous 0.81 0.5 - 1.9 mmol/L  Urinalysis, Routine w reflex microscopic     Status: Abnormal   Collection Time: 01/16/18  8:45 PM  Result Value Ref Range   Color, Urine AMBER (A) YELLOW    Comment: BIOCHEMICALS MAY BE AFFECTED BY COLOR   APPearance CLOUDY (A) CLEAR   Specific Gravity, Urine 1.013 1.005 - 1.030   pH 7.0 5.0 - 8.0   Glucose, UA NEGATIVE NEGATIVE mg/dL   Hgb urine dipstick NEGATIVE NEGATIVE   Bilirubin Urine NEGATIVE NEGATIVE   Ketones, ur NEGATIVE NEGATIVE mg/dL   Protein, ur NEGATIVE NEGATIVE mg/dL   Nitrite POSITIVE (A) NEGATIVE   Leukocytes, UA MODERATE (A) NEGATIVE   RBC / HPF 0-5 0 - 5 RBC/hpf   WBC, UA 21-50 0 - 5 WBC/hpf   Bacteria, UA MANY (A) NONE SEEN   Squamous Epithelial / LPF 0-5 0 - 5   Mucus PRESENT     Comment: Performed at [facility], [place] [address] [place], [place] [address]   Ct Chest W Contrast  Result Date: 01/16/2018 CLINICAL DATA:  Pleural effusion. Low hemoglobin. Pain with movement. EXAM: CT CHEST, ABDOMEN, AND PELVIS WITH CONTRAST TECHNIQUE: Multidetector CT imaging of the chest, abdomen and pelvis was performed following the standard protocol during bolus administration of intravenous contrast. CONTRAST:  32m ISOVUE-300 IOPAMIDOL (ISOVUE-300) INJECTION 61% COMPARISON:  CT abdomen dated 11/11/2017. FINDINGS: CT CHEST FINDINGS Cardiovascular: Cardiomegaly. No pericardial effusion. No thoracic aortic aneurysm or evidence of aortic dissection. Scattered aortic atherosclerosis. Prominent main pulmonary artery indicating chronic pulmonary artery hypertension. Mediastinum/Nodes: No mass or lymphadenopathy is appreciated  within the mediastinum or perihilar regions. Esophagus is unremarkable. Trachea and central bronchi are unremarkable. Lungs/Pleura: Moderate to large pleural effusions bilaterally, RIGHT slightly greater than LEFT. Associated atelectasis bilaterally. No pneumothorax. Musculoskeletal: No acute or suspicious osseous finding. Median sternotomy wires in place. Degenerative changes noted at both shoulders, moderate to severe in degree. Mild degenerative spurring within the thoracic spine. CT ABDOMEN PELVIS FINDINGS Hepatobiliary: Liver contours are slightly nodular throughout suggesting cirrhosis. No focal liver abnormality is seen. Presumed cholecystectomy. No bile duct dilatation or bile duct stone seen. Pancreas: Unremarkable. Spleen: Normal in size without focal abnormality. Adrenals/Urinary Tract: Adrenal glands appear normal. LEFT renal cyst. No suspicious mass, stone or hydronephrosis bilaterally. Thickening of the walls of the bladder circumferentially. Stomach/Bowel: Rectal vault is moderately distended with stool. There is at least mild thickening of the walls of the rectosigmoid colon. No dilated large or small bowel loops. No additional site of bowel wall thickening or evidence of bowel wall inflammation. Appendix is not convincingly seen. Vascular/Lymphatic: Marked aortic atherosclerosis. Additional atherosclerosis at the origins of each of the aortic branch vessels. No acute appearing vascular abnormality. Reproductive: Presumed hysterectomy.  No adnexal mass seen. Other: Small amount of free fluid in the pelvis. No abscess collection identified. No free intraperitoneal air. Musculoskeletal: No acute or suspicious osseous finding. Sclerotic changes of the femoral heads bilaterally suggesting chronic AVN. Degenerative spondylosis of the lumbar spine, mild to moderate in degree. Mild ill-defined edema within the subcutaneous soft tissues of the abdomen and pelvis suggesting  anasarca. IMPRESSION: 1. Moderate  to large pleural effusions bilaterally, RIGHT slightly greater than LEFT, with associated atelectasis bilaterally. 2. Cardiomegaly.  No evidence of associated pulmonary edema. 3. Thickening of the bladder walls circumferentially, suspicious for cystitis. Consider correlation with urinalysis. 4. Rectal vault is moderately distended with stool. Differential considerations include stercoral colitis, localized colitis of infectious or inflammatory nature, and reactive thickening secondary to the adjacent suspected cystitis. 5. Aortic atherosclerosis. 6. Probable anasarca. 7. Additional chronic/incidental findings detailed above. Electronically Signed   By: Franki Cabot M.D.   On: 01/16/2018 21:51   Ct Abdomen Pelvis W Contrast  Result Date: 01/16/2018 CLINICAL DATA:  Pleural effusion. Low hemoglobin. Pain with movement. EXAM: CT CHEST, ABDOMEN, AND PELVIS WITH CONTRAST TECHNIQUE: Multidetector CT imaging of the chest, abdomen and pelvis was performed following the standard protocol during bolus administration of intravenous contrast. CONTRAST:  56m ISOVUE-300 IOPAMIDOL (ISOVUE-300) INJECTION 61% COMPARISON:  CT abdomen dated 11/11/2017. FINDINGS: CT CHEST FINDINGS Cardiovascular: Cardiomegaly. No pericardial effusion. No thoracic aortic aneurysm or evidence of aortic dissection. Scattered aortic atherosclerosis. Prominent main pulmonary artery indicating chronic pulmonary artery hypertension. Mediastinum/Nodes: No mass or lymphadenopathy is appreciated within the mediastinum or perihilar regions. Esophagus is unremarkable. Trachea and central bronchi are unremarkable. Lungs/Pleura: Moderate to large pleural effusions bilaterally, RIGHT slightly greater than LEFT. Associated atelectasis bilaterally. No pneumothorax. Musculoskeletal: No acute or suspicious osseous finding. Median sternotomy wires in place. Degenerative changes noted at both shoulders, moderate to severe in degree. Mild degenerative spurring within  the thoracic spine. CT ABDOMEN PELVIS FINDINGS Hepatobiliary: Liver contours are slightly nodular throughout suggesting cirrhosis. No focal liver abnormality is seen. Presumed cholecystectomy. No bile duct dilatation or bile duct stone seen. Pancreas: Unremarkable. Spleen: Normal in size without focal abnormality. Adrenals/Urinary Tract: Adrenal glands appear normal. LEFT renal cyst. No suspicious mass, stone or hydronephrosis bilaterally. Thickening of the walls of the bladder circumferentially. Stomach/Bowel: Rectal vault is moderately distended with stool. There is at least mild thickening of the walls of the rectosigmoid colon. No dilated large or small bowel loops. No additional site of bowel wall thickening or evidence of bowel wall inflammation. Appendix is not convincingly seen. Vascular/Lymphatic: Marked aortic atherosclerosis. Additional atherosclerosis at the origins of each of the aortic branch vessels. No acute appearing vascular abnormality. Reproductive: Presumed hysterectomy.  No adnexal mass seen. Other: Small amount of free fluid in the pelvis. No abscess collection identified. No free intraperitoneal air. Musculoskeletal: No acute or suspicious osseous finding. Sclerotic changes of the femoral heads bilaterally suggesting chronic AVN. Degenerative spondylosis of the lumbar spine, mild to moderate in degree. Mild ill-defined edema within the subcutaneous soft tissues of the abdomen and pelvis suggesting anasarca. IMPRESSION: 1. Moderate to large pleural effusions bilaterally, RIGHT slightly greater than LEFT, with associated atelectasis bilaterally. 2. Cardiomegaly.  No evidence of associated pulmonary edema. 3. Thickening of the bladder walls circumferentially, suspicious for cystitis. Consider correlation with urinalysis. 4. Rectal vault is moderately distended with stool. Differential considerations include stercoral colitis, localized colitis of infectious or inflammatory nature, and reactive  thickening secondary to the adjacent suspected cystitis. 5. Aortic atherosclerosis. 6. Probable anasarca. 7. Additional chronic/incidental findings detailed above. Electronically Signed   By: SFranki CabotM.D.   On: 01/16/2018 21:51    Pending Labs Unresulted Labs (From admission, onward)   Start     Ordered   01/16/18 2209  Prepare RBC  (Adult Blood Administration - PRBC)  Once,   R    Question Answer Comment  #  of Units 1 unit   Transfusion Indications Symptomatic Anemia   Number of Units to Keep Ahead 2 units ahead   If emergent release call blood bank Not emergent release      01/16/18 2208   01/16/18 2045  Urine culture  STAT,   STAT    Question:  Patient immune status  Answer:  Normal   01/16/18 2044      Vitals/Pain Today's Vitals   01/16/18 1716 01/16/18 1727 01/16/18 1821 01/16/18 2039  BP:  (!) 113/50 (!) 119/48 (!) 133/54  Pulse:  79 79 78  Resp:  _0 Temp:  98.2 F (36.8 C)    TempSrc:  Oral    SpO2: 97% 96% 98% 100%  Weight:  100 lb (45.4 kg)    Height:  _1  (1.702 m)    PainSc:  5       Isolation Precautions No active isolations  Medications Medications  ondansetron (ZOFRAN) injection 4 mg (has no administration in time range)  iopamidol (ISOVUE-300) 61 % injection (has no administration in time range)  iopamidol (ISOVUE-300) 61 % injection 100 mL (80 mLs Intravenous Contrast Given 01/16/18 2113)  cefTRIAXone (ROCEPHIN) 1 g in sodium chloride 0.9 % 100 mL IVPB (0 g Intravenous Stopped 01/16/18 2226)  0.9 %  sodium chloride infusion (10 mL/hr Intravenous New Bag/Given 01/16/18 2212)    Mobility non-ambulatory

## 2018-01-16 NOTE — H&P (Signed)
History and Physical    Jean Hodges XBJ:[id number] DOB: Sep 04, 1933 DOA: 01/16/2018  PCP: Patient, No Pcp Per  Patient coming from: Skilled nursing facility.  History obtained from ER physician as patient has dementia.  No family at the bedside.  Chief Complaint: Low hemoglobin per the labs done at nursing facility.  HPI: Jean Hodges is a 84 y.o. 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  Patient in addition was complaining of abdominal pain.  Patient is a poor historian does not contribute much to the history.  Patient was recently admitted 2 weeks ago for acute confusional state suspected to be secondary to UTI.  ED Course: In the ER on exam patient had mild abdominal tenderness in the lower quadrant.  Hemoglobin is around 6.7 and a CAT scan done shows rectal vault was full with possible stercoral colitis.  UA is consistent with UTI and patient was placed on antibiotics 2 units of PRBC transfusion has been ordered.  In addition the CAT scan also showed bilateral pleural effusion.  Patient was not hypoxic.  Review of Systems: As per HPI, rest all negative.   Past Medical History:  Diagnosis Date  . Acute encephalopathy   . Acute encephalopathy 09/30/2016  . Alzheimer's dementia without behavioral disturbance   . Alzheimer's dementia without behavioral disturbance 10/07/2016  . Anemia   . Anemia 01/28/2014  . Atherosclerotic PVD with ulceration ([place])   . CAD (coronary artery disease)   . CAD (coronary artery disease) 01/04/2014  . Cellulitis   . Cellulitis of left leg   . CHF (congestive heart failure) ([place])   . Diabetes mellitus without complication ([place])   . DM  (diabetes mellitus) ([place]) 01/27/2014  . Dysphagia   . HTN (hypertension) 01/04/2014  . Hyperlipidemia   . Hypertension   . Hypoglycemia associated with type 2 diabetes mellitus ([place])   . Macrocytic anemia   . Seizures ([place]) 09/30/2016  . Tear of left rotator cuff   . UTI (urinary tract infection)   . UTI (urinary tract infection) 09/25/2016    Past Surgical History:  Procedure Laterality Date  . BELOW KNEE LEG AMPUTATION Right   . PACEMAKER IMPLANT       reports that she has never smoked. She has never used smokeless tobacco. She reports that she does not drink alcohol or use drugs.  Allergies  Allergen Reactions  . Acetaminophen Other (See Comments)    Unknown  . Codeine Nausea Only  . Dilaudid [Hydromorphone] Nausea Only  . Hydrocodone Other (See Comments)    Hallucinations  . Keflex [Cephalexin] Diarrhea    Family History  Family history unknown: Yes    Prior to Admission medications   Medication Sig Start Date End Date Taking? Authorizing Provider  carvedilol (COREG) 12.5 MG tablet Take 12.5 mg by mouth 2 (two) times daily with a meal. Hold for SBP<110 and/or pulse <60/min   Yes [provider]  ferrous sulfate 325 (65 FE) MG tablet Take 325 mg by mouth 2 (two) times daily with a meal.   Yes [provider]  furosemide (LASIX) 20 MG tablet Take 1 tablet (20 mg total) by mouth daily. Patient taking differently: Take 10 mg by mouth daily.  11/14/17 11/14/18 Yes  Jani Gravel, MD  levETIRAcetam (KEPPRA) 500 MG tablet Take 500 mg by mouth 2 (two) times daily.   Yes [provider]  levothyroxine (SYNTHROID, LEVOTHROID) 100 MCG tablet Take 100 mcg by mouth daily before breakfast.   Yes [provider]  magnesium oxide (MAG-OX) 400 MG tablet Take 400 mg by mouth daily.   Yes [provider]  Menthol, Topical Analgesic, (BIOFREEZE) 4 % GEL Apply 1 application topically 2 (two) times daily as needed (pain).   Yes [provider]    Multiple Vitamins-Minerals (DECUBI-VITE PO) Take 1 capsule by mouth daily.   Yes [provider]  oxycodone (OXY-IR) 5 MG capsule Take 2.5 mg by mouth 3 (three) times daily as needed.   Yes [provider]  pantoprazole (PROTONIX) 40 MG tablet Take 1 tablet (40 mg total) by mouth daily. 07/25/17  Yes Barton Dubois, MD  polyethylene glycol [facility] / GLYCOLAX) packet Take 17 g by mouth daily.   Yes [provider]  potassium chloride SA (K-DUR,KLOR-CON) 20 MEQ tablet Take 20 mEq by mouth daily.   Yes [provider]  sennosides-docusate sodium (SENOKOT-S) 8.6-50 MG tablet Take 2 tablets by mouth 2 (two) times daily.   Yes [provider]  sertraline (ZOLOFT) 25 MG tablet Take 75 mg by mouth daily.   Yes [provider]  feeding supplement, ENSURE ENLIVE, (ENSURE ENLIVE) LIQD Take 237 mLs by mouth 3 (three) times daily between meals. Patient not taking: Reported on 11/03/2017 10/10/17   Yaakov Guthrie, MD  nystatin (MYCOSTATIN) 100000 UNIT/ML suspension Take 5 mLs (500,000 Units total) by mouth 4 (four) times daily. Patient not taking: Reported on 12/12/2017 11/14/17   Jani Gravel, MD  ondansetron (ZOFRAN) 4 MG tablet Take 4 mg by mouth every 6 (six) hours as needed for nausea or vomiting.    [provider]    Physical Exam: Vitals:   01/16/18 1716 01/16/18 1727 01/16/18 1821 01/16/18 2039  BP:  (!) 113/50 (!) 119/48 (!) 133/54  Pulse:  79 79 78  Resp:  18 18 16   Temp:  98.2 F (36.8 C)    TempSrc:  Oral    SpO2: 97% 96% 98% 100%  Weight:  45.4 kg (100 lb)    Height:  5\' 7"  (1.702 m)        Constitutional: Moderately built and nourished. Vitals:   01/16/18 1716 01/16/18 1727 01/16/18 1821 01/16/18 2039  BP:  (!) 113/50 (!) 119/48 (!) 133/54  Pulse:  79 79 78  Resp:  18 18 16   Temp:  98.2 F (36.8 C)    TempSrc:  Oral    SpO2: 97% 96% 98% 100%  Weight:  45.4 kg (100 lb)    Height:  5\' 7"  (1.702 m)     Eyes: Anicteric  mild pallor. ENMT: No discharge from the ears eyes nose or mouth. Neck: No mass felt.  No neck rigidity.  No JVD appreciated. Respiratory: No rhonchi or crepitations. Cardiovascular: S1 and S2 heard no murmurs appreciated. Abdomen: Soft mild lower quadrant tenderness.  No guarding or rigidity. Musculoskeletal: Mild edema of the lower extremities. Skin: No rash. Neurologic: Alert awake oriented to name.  Follows commands moves all extremities. Psychiatric: Appears confused.   Labs on Admission: I have personally reviewed following labs and imaging studies  CBC: Recent Labs  Lab 01/16/18 1804  WBC 5.1  NEUTROABS 3.4  HGB 6.9*  HCT 21.1*  MCV 98.6  PLT 283*   Basic Metabolic Panel: Recent  Labs  Lab 01/16/18 1804  NA 141  K 5.0  CL 107  CO2 30  GLUCOSE 133*  BUN 24*  CREATININE 0.69  CALCIUM 8.6*   GFR: Estimated Creatinine Clearance: 38.9 mL/min (by C-G formula based on SCr of 0.69 mg/dL). Liver Function Tests: Recent Labs  Lab 01/16/18 1804  AST 31  ALT 35  ALKPHOS 88  BILITOT 0.2*  PROT 5.2*  ALBUMIN 2.2*   Recent Labs  Lab 01/16/18 1804  LIPASE 26   No results for input(s): AMMONIA in the last 168 hours. Coagulation Profile: No results for input(s): INR, PROTIME in the last 168 hours. Cardiac Enzymes: No results for input(s): CKTOTAL, CKMB, CKMBINDEX, TROPONINI in the last 168 hours. BNP (last 3 results) No results for input(s): PROBNP in the last 8760 hours. HbA1C: No results for input(s): HGBA1C in the last 72 hours. CBG: No results for input(s): GLUCAP in the last 168 hours. Lipid Profile: No results for input(s): CHOL, HDL, LDLCALC, TRIG, CHOLHDL, LDLDIRECT in the last 72 hours. Thyroid Function Tests: No results for input(s): TSH, T4TOTAL, FREET4, T3FREE, THYROIDAB in the last 72 hours. Anemia Panel: No results for input(s): VITAMINB12, FOLATE, FERRITIN, TIBC, IRON, RETICCTPCT in the last 72 hours. Urine analysis:    Component Value  Date/Time   COLORURINE AMBER (A) 01/16/2018 2045   APPEARANCEUR CLOUDY (A) 01/16/2018 2045   LABSPEC 1.013 01/16/2018 2045   PHURINE 7.0 01/16/2018 2045   GLUCOSEU NEGATIVE 01/16/2018 2045   HGBUR NEGATIVE 01/16/2018 2045   BILIRUBINUR NEGATIVE 01/16/2018 2045   KETONESUR NEGATIVE 01/16/2018 2045   PROTEINUR NEGATIVE 01/16/2018 2045   NITRITE POSITIVE (A) 01/16/2018 2045   LEUKOCYTESUR MODERATE (A) 01/16/2018 2045   Sepsis Labs: @LABRCNTIP (procalcitonin:4,lacticidven:4) )No results found for this or any previous visit (from the past 240 hour(s)).   Radiological Exams on Admission: Ct Chest W Contrast  Result Date: 01/16/2018 CLINICAL DATA:  Pleural effusion. Low hemoglobin. Pain with movement. EXAM: CT CHEST, ABDOMEN, AND PELVIS WITH CONTRAST TECHNIQUE: Multidetector CT imaging of the chest, abdomen and pelvis was performed following the standard protocol during bolus administration of intravenous contrast. CONTRAST:  40mL ISOVUE-300 IOPAMIDOL (ISOVUE-300) INJECTION 61% COMPARISON:  CT abdomen dated 11/11/2017. FINDINGS: CT CHEST FINDINGS Cardiovascular: Cardiomegaly. No pericardial effusion. No thoracic aortic aneurysm or evidence of aortic dissection. Scattered aortic atherosclerosis. Prominent main pulmonary artery indicating chronic pulmonary artery hypertension. Mediastinum/Nodes: No mass or lymphadenopathy is appreciated within the mediastinum or perihilar regions. Esophagus is unremarkable. Trachea and central bronchi are unremarkable. Lungs/Pleura: Moderate to large pleural effusions bilaterally, RIGHT slightly greater than LEFT. Associated atelectasis bilaterally. No pneumothorax. Musculoskeletal: No acute or suspicious osseous finding. Median sternotomy wires in place. Degenerative changes noted at both shoulders, moderate to severe in degree. Mild degenerative spurring within the thoracic spine. CT ABDOMEN PELVIS FINDINGS Hepatobiliary: Liver contours are slightly nodular throughout  suggesting cirrhosis. No focal liver abnormality is seen. Presumed cholecystectomy. No bile duct dilatation or bile duct stone seen. Pancreas: Unremarkable. Spleen: Normal in size without focal abnormality. Adrenals/Urinary Tract: Adrenal glands appear normal. LEFT renal cyst. No suspicious mass, stone or hydronephrosis bilaterally. Thickening of the walls of the bladder circumferentially. Stomach/Bowel: Rectal vault is moderately distended with stool. There is at least mild thickening of the walls of the rectosigmoid colon. No dilated large or small bowel loops. No additional site of bowel wall thickening or evidence of bowel wall inflammation. Appendix is not convincingly seen. Vascular/Lymphatic: Marked aortic atherosclerosis. Additional atherosclerosis at the origins of each of the aortic  branch vessels. No acute appearing vascular abnormality. Reproductive: Presumed hysterectomy.  No adnexal mass seen. Other: Small amount of free fluid in the pelvis. No abscess collection identified. No free intraperitoneal air. Musculoskeletal: No acute or suspicious osseous finding. Sclerotic changes of the femoral heads bilaterally suggesting chronic AVN. Degenerative spondylosis of the lumbar spine, mild to moderate in degree. Mild ill-defined edema within the subcutaneous soft tissues of the abdomen and pelvis suggesting anasarca. IMPRESSION: 1. Moderate to large pleural effusions bilaterally, RIGHT slightly greater than LEFT, with associated atelectasis bilaterally. 2. Cardiomegaly.  No evidence of associated pulmonary edema. 3. Thickening of the bladder walls circumferentially, suspicious for cystitis. Consider correlation with urinalysis. 4. Rectal vault is moderately distended with stool. Differential considerations include stercoral colitis, localized colitis of infectious or inflammatory nature, and reactive thickening secondary to the adjacent suspected cystitis. 5. Aortic atherosclerosis. 6. Probable anasarca. 7.  Additional chronic/incidental findings detailed above. Electronically Signed   By: Franki Cabot M.D.   On: 01/16/2018 21:51   Ct Abdomen Pelvis W Contrast  Result Date: 01/16/2018 CLINICAL DATA:  Pleural effusion. Low hemoglobin. Pain with movement. EXAM: CT CHEST, ABDOMEN, AND PELVIS WITH CONTRAST TECHNIQUE: Multidetector CT imaging of the chest, abdomen and pelvis was performed following the standard protocol during bolus administration of intravenous contrast. CONTRAST:  42mL ISOVUE-300 IOPAMIDOL (ISOVUE-300) INJECTION 61% COMPARISON:  CT abdomen dated 11/11/2017. FINDINGS: CT CHEST FINDINGS Cardiovascular: Cardiomegaly. No pericardial effusion. No thoracic aortic aneurysm or evidence of aortic dissection. Scattered aortic atherosclerosis. Prominent main pulmonary artery indicating chronic pulmonary artery hypertension. Mediastinum/Nodes: No mass or lymphadenopathy is appreciated within the mediastinum or perihilar regions. Esophagus is unremarkable. Trachea and central bronchi are unremarkable. Lungs/Pleura: Moderate to large pleural effusions bilaterally, RIGHT slightly greater than LEFT. Associated atelectasis bilaterally. No pneumothorax. Musculoskeletal: No acute or suspicious osseous finding. Median sternotomy wires in place. Degenerative changes noted at both shoulders, moderate to severe in degree. Mild degenerative spurring within the thoracic spine. CT ABDOMEN PELVIS FINDINGS Hepatobiliary: Liver contours are slightly nodular throughout suggesting cirrhosis. No focal liver abnormality is seen. Presumed cholecystectomy. No bile duct dilatation or bile duct stone seen. Pancreas: Unremarkable. Spleen: Normal in size without focal abnormality. Adrenals/Urinary Tract: Adrenal glands appear normal. LEFT renal cyst. No suspicious mass, stone or hydronephrosis bilaterally. Thickening of the walls of the bladder circumferentially. Stomach/Bowel: Rectal vault is moderately distended with stool. There is at  least mild thickening of the walls of the rectosigmoid colon. No dilated large or small bowel loops. No additional site of bowel wall thickening or evidence of bowel wall inflammation. Appendix is not convincingly seen. Vascular/Lymphatic: Marked aortic atherosclerosis. Additional atherosclerosis at the origins of each of the aortic branch vessels. No acute appearing vascular abnormality. Reproductive: Presumed hysterectomy.  No adnexal mass seen. Other: Small amount of free fluid in the pelvis. No abscess collection identified. No free intraperitoneal air. Musculoskeletal: No acute or suspicious osseous finding. Sclerotic changes of the femoral heads bilaterally suggesting chronic AVN. Degenerative spondylosis of the lumbar spine, mild to moderate in degree. Mild ill-defined edema within the subcutaneous soft tissues of the abdomen and pelvis suggesting anasarca. IMPRESSION: 1. Moderate to large pleural effusions bilaterally, RIGHT slightly greater than LEFT, with associated atelectasis bilaterally. 2. Cardiomegaly.  No evidence of associated pulmonary edema. 3. Thickening of the bladder walls circumferentially, suspicious for cystitis. Consider correlation with urinalysis. 4. Rectal vault is moderately distended with stool. Differential considerations include stercoral colitis, localized colitis of infectious or inflammatory nature, and reactive thickening secondary to the  adjacent suspected cystitis. 5. Aortic atherosclerosis. 6. Probable anasarca. 7. Additional chronic/incidental findings detailed above. Electronically Signed   By: Franki Cabot M.D.   On: 01/16/2018 21:51      Assessment/Plan Active Problems:   Hypothyroidism (acquired)   Type 2 diabetes mellitus with peripheral vascular disease ([place])   Essential hypertension   Alzheimer's dementia   Coronary artery disease involving native coronary artery of native heart without angina pectoris   Symptomatic anemia   Anemia   Severe  protein-calorie malnutrition ([facility])   UTI (urinary tract infection)   Pressure injury of skin   Abdominal pain    1. Symptomatic anemia -patient has known history of chronic anemia and thrombocytopenia likely from myelodysplasia but bone marrow studies were declined by the family and patient.  Patient receives blood transfusion as and when needed.  2 units of PRBC has been ordered.  Continue iron supplements. 2. Abdominal pain with CT scan showing rectal wall being full with stools and possible stercoral colitis.  Soapsuds enema will be ordered.  Patient is on empiric antibiotics Cipro and Flagyl. 3. Possible UTI on antibiotics.  Follow urine cultures. 4. Chronic systolic heart failure last EF measured in April 2019 was 20 to 25%.  Patient is on Lasix 10 mg p.o. daily 1 dose has been ordered for now.  May need additional doses.  Patient is receiving blood transfusion closely follow respiratory status.  Patient is presently not hypoxic. 5. Hypothyroidism on Synthroid. 6. Depression on Zoloft. 7. Paroxysmal atrial fibrillation on Coreg.  Not on anticoagulation. 8. History of seizures on Keppra. 9. History of dementia.   DVT prophylaxis: Lovenox. Code Status: Full code. Family Communication: No family at the bedside. Disposition Plan: Back to facility. Consults called: None.  Admission status: Observation.   Rise Patience MD [facility] Pager [phone].  If 7PM-7AM, please contact night-coverage [email or web address] Password [email or web address]  01/16/2018, 11:10 PM

## 2018-01-16 NOTE — Progress Notes (Signed)
Received pt to the floor at this time. Patient is alert and calm with c/o stomach pain that comes and go. Noted scattered large red and purple bruises on upper arms. Skin very fragile. Right below the knee amputation. Great toe on Left  foot has small pink  area that is about 0.5 cm x 0.5cm. Has stage 2 area on coccyx  that is 1 cm x 1cm covered with a xeroform and foam dressing.  Patient incontinent of bowel and noted wth dark green stool. Temp 98.5.

## 2018-01-16 NOTE — ED Provider Notes (Signed)
[facility] Provider Note   CSN: [id number] Arrival date & time: 01/16/18  1658     History   Chief Complaint Chief Complaint  Patient presents with  . Abnormal Lab    Low hemoglobin    HPI Jean Hodges is a 84 y.o. female.  84 year old female with past medical history including Alzheimer's dementia, CAD, type 2 diabetes mellitus, anemia requiring blood transfusions who presents with anemia.  The patient was sent from her nursing facility today where labs were notable for hemoglobin of 6.7.  She is previously had blood transfusions for her anemia.  Currently, she complains of abdominal pain and states that she had an episode of vomiting.  She denies any breathing problems.  LEVEL 5 CAVEAT DUE TO DEMENTIA  The history is provided by the nursing home, the EMS personnel and the patient.    Past Medical History:  Diagnosis Date  . Acute encephalopathy   . Acute encephalopathy 09/30/2016  . Alzheimer's dementia without behavioral disturbance   . Alzheimer's dementia without behavioral disturbance 10/07/2016  . Anemia   . Anemia 01/28/2014  . Atherosclerotic PVD with ulceration ([place])   . CAD (coronary artery disease)   . CAD (coronary artery disease) 01/04/2014  . Cellulitis   . Cellulitis of left leg   . CHF (congestive heart failure) ([place])   . Diabetes mellitus without complication ([place])   . DM (diabetes mellitus) ([place]) 01/27/2014  . Dysphagia   . HTN (hypertension) 01/04/2014  . Hyperlipidemia   . Hypertension   . Hypoglycemia associated with type 2 diabetes mellitus ([place])   . Macrocytic anemia   . Seizures ([place]) 09/30/2016  . Tear of left rotator cuff   . UTI (urinary tract infection)   . UTI (urinary tract infection) 09/25/2016    Patient Active Problem List   Diagnosis Date Noted  . Abdominal pain 01/16/2018  . Acute encephalopathy 12/29/2017  . Memory loss 12/02/2017  . Bruising, spontaneous 12/01/2017  . Adult  failure to thrive   . Palliative care by specialist   . Palliative care encounter   . Altered mental status   . Pressure injury of skin 11/04/2017  . Hyperkalemia 11/04/2017  . UTI (urinary tract infection) 11/03/2017  . Severe protein-calorie malnutrition ([place]) 10/10/2017  . Anemia 07/23/2017  . Symptomatic anemia 11/27/2016  . Tonic-clonic seizure disorder ([place]) 10/09/2016  . Dysphagia 10/09/2016  . Moderate protein-calorie malnutrition ([place]) 10/09/2016  . Hypothyroidism (acquired) 10/09/2016  . Type 2 diabetes mellitus with peripheral vascular disease ([place]) 10/09/2016  . PVD (peripheral vascular disease) ([place]) 10/09/2016  . Essential hypertension 10/09/2016  . Alzheimer's dementia 10/09/2016  . Chronic depression 10/09/2016  . Hyperlipidemia 10/09/2016  . Coronary artery disease involving native coronary artery of native heart without angina pectoris 10/09/2016    Past Surgical History:  Procedure Laterality Date  . BELOW KNEE LEG AMPUTATION Right   . PACEMAKER IMPLANT       OB History   None      Home Medications    Prior to Admission medications   Medication Sig Start Date End Date Taking? Authorizing Provider  carvedilol (COREG) 12.5 MG tablet Take 12.5 mg by mouth 2 (two) times daily with a meal. Hold for SBP<110 and/or pulse <60/min   Yes [provider]  ferrous sulfate 325 (65 FE) MG tablet Take 325 mg by mouth 2 (two) times daily with a meal.   Yes [provider]  furosemide (LASIX) 20 MG  tablet Take 1 tablet (20 mg total) by mouth daily. Patient taking differently: Take 10 mg by mouth daily.  11/14/17 11/14/18 Yes Jani Gravel, MD  levETIRAcetam (KEPPRA) 500 MG tablet Take 500 mg by mouth 2 (two) times daily.   Yes [provider]  levothyroxine (SYNTHROID, LEVOTHROID) 100 MCG tablet Take 100 mcg by mouth daily before breakfast.   Yes [provider]  magnesium oxide (MAG-OX) 400 MG tablet Take 400 mg by mouth daily.   Yes  [provider]  Menthol, Topical Analgesic, (BIOFREEZE) 4 % GEL Apply 1 application topically 2 (two) times daily as needed (pain).   Yes [provider]  Multiple Vitamins-Minerals (DECUBI-VITE PO) Take 1 capsule by mouth daily.   Yes [provider]  oxycodone (OXY-IR) 5 MG capsule Take 2.5 mg by mouth 3 (three) times daily as needed.   Yes [provider]  pantoprazole (PROTONIX) 40 MG tablet Take 1 tablet (40 mg total) by mouth daily. 07/25/17  Yes Barton Dubois, MD  polyethylene glycol [facility] / GLYCOLAX) packet Take 17 g by mouth daily.   Yes [provider]  potassium chloride SA (K-DUR,KLOR-CON) 20 MEQ tablet Take 20 mEq by mouth daily.   Yes [provider]  sennosides-docusate sodium (SENOKOT-S) 8.6-50 MG tablet Take 2 tablets by mouth 2 (two) times daily.   Yes [provider]  sertraline (ZOLOFT) 25 MG tablet Take 75 mg by mouth daily.   Yes [provider]  feeding supplement, ENSURE ENLIVE, (ENSURE ENLIVE) LIQD Take 237 mLs by mouth 3 (three) times daily between meals. Patient not taking: Reported on 11/03/2017 10/10/17   Yaakov Guthrie, MD  nystatin (MYCOSTATIN) 100000 UNIT/ML suspension Take 5 mLs (500,000 Units total) by mouth 4 (four) times daily. Patient not taking: Reported on 12/12/2017 11/14/17   Jani Gravel, MD  ondansetron (ZOFRAN) 4 MG tablet Take 4 mg by mouth every 6 (six) hours as needed for nausea or vomiting.    [provider]    Family History Family History  Family history unknown: Yes    Social History Social History   Tobacco Use  . Smoking status: Never Smoker  . Smokeless tobacco: Never Used  Substance Use Topics  . Alcohol use: No  . Drug use: No     Allergies   Acetaminophen; Codeine; Dilaudid [hydromorphone]; Hydrocodone; and Keflex [cephalexin]   Review of Systems Review of Systems  Unable to perform ROS: Dementia     Physical Exam Updated Vital Signs BP  (!) 129/48   Pulse 79   Temp 98.2 F (36.8 C) (Oral)   Resp 16   Ht 5\' 7"  (1.702 m)   Wt 45.4 kg (100 lb)   SpO2 98%   BMI 15.66 kg/m   Physical Exam  Constitutional: She is oriented to person, place, and time. No distress.  Pale, chronically ill-appearing woman awake and comfortable  HENT:  Head: Normocephalic and atraumatic.  Moist mucous membranes  Eyes: Conjunctivae are normal.  Neck: Neck supple.  Cardiovascular: Normal rate, regular rhythm and normal heart sounds.  No murmur heard. Pulmonary/Chest: Effort normal and breath sounds normal.  Abdominal: Soft. Bowel sounds are normal. She exhibits no distension. There is tenderness. There is no rebound and no guarding.  Generalized abdominal tenderness  Musculoskeletal: She exhibits no edema.  Neurological: She is alert and oriented to person, place, and time.  Fluent speech  Skin: Skin is warm and dry. There is pallor.  Psychiatric:  Calm, cooperative  Nursing note  and vitals reviewed.    ED Treatments / Results  Labs (all labs ordered are listed, but only abnormal results are displayed) Labs Reviewed  CBC WITH DIFFERENTIAL/PLATELET - Abnormal; Notable for the following components:      Result Value   RBC 2.14 (*)    Hemoglobin 6.9 (*)    HCT 21.1 (*)    RDW 20.3 (*)    Platelets 120 (*)    All other components within normal limits  COMPREHENSIVE METABOLIC PANEL - Abnormal; Notable for the following components:   Glucose, Bld 133 (*)    BUN 24 (*)    Calcium 8.6 (*)    Total Protein 5.2 (*)    Albumin 2.2 (*)    Total Bilirubin 0.2 (*)    Anion gap 4 (*)    All other components within normal limits  URINALYSIS, ROUTINE W REFLEX MICROSCOPIC - Abnormal; Notable for the following components:   Color, Urine AMBER (*)    APPearance CLOUDY (*)    Nitrite POSITIVE (*)    Leukocytes, UA MODERATE (*)    Bacteria, UA MANY (*)    All other components within normal limits  URINE CULTURE  LIPASE, BLOOD  BASIC  METABOLIC PANEL  CBC  I-STAT CG4 LACTIC ACID, ED  I-STAT CG4 LACTIC ACID, ED  TYPE AND SCREEN  PREPARE RBC (CROSSMATCH)    EKG None  Radiology Ct Chest W Contrast  Result Date: 01/16/2018 CLINICAL DATA:  Pleural effusion. Low hemoglobin. Pain with movement. EXAM: CT CHEST, ABDOMEN, AND PELVIS WITH CONTRAST TECHNIQUE: Multidetector CT imaging of the chest, abdomen and pelvis was performed following the standard protocol during bolus administration of intravenous contrast. CONTRAST:  27mL ISOVUE-300 IOPAMIDOL (ISOVUE-300) INJECTION 61% COMPARISON:  CT abdomen dated 11/11/2017. FINDINGS: CT CHEST FINDINGS Cardiovascular: Cardiomegaly. No pericardial effusion. No thoracic aortic aneurysm or evidence of aortic dissection. Scattered aortic atherosclerosis. Prominent main pulmonary artery indicating chronic pulmonary artery hypertension. Mediastinum/Nodes: No mass or lymphadenopathy is appreciated within the mediastinum or perihilar regions. Esophagus is unremarkable. Trachea and central bronchi are unremarkable. Lungs/Pleura: Moderate to large pleural effusions bilaterally, RIGHT slightly greater than LEFT. Associated atelectasis bilaterally. No pneumothorax. Musculoskeletal: No acute or suspicious osseous finding. Median sternotomy wires in place. Degenerative changes noted at both shoulders, moderate to severe in degree. Mild degenerative spurring within the thoracic spine. CT ABDOMEN PELVIS FINDINGS Hepatobiliary: Liver contours are slightly nodular throughout suggesting cirrhosis. No focal liver abnormality is seen. Presumed cholecystectomy. No bile duct dilatation or bile duct stone seen. Pancreas: Unremarkable. Spleen: Normal in size without focal abnormality. Adrenals/Urinary Tract: Adrenal glands appear normal. LEFT renal cyst. No suspicious mass, stone or hydronephrosis bilaterally. Thickening of the walls of the bladder circumferentially. Stomach/Bowel: Rectal vault is moderately distended with  stool. There is at least mild thickening of the walls of the rectosigmoid colon. No dilated large or small bowel loops. No additional site of bowel wall thickening or evidence of bowel wall inflammation. Appendix is not convincingly seen. Vascular/Lymphatic: Marked aortic atherosclerosis. Additional atherosclerosis at the origins of each of the aortic branch vessels. No acute appearing vascular abnormality. Reproductive: Presumed hysterectomy.  No adnexal mass seen. Other: Small amount of free fluid in the pelvis. No abscess collection identified. No free intraperitoneal air. Musculoskeletal: No acute or suspicious osseous finding. Sclerotic changes of the femoral heads bilaterally suggesting chronic AVN. Degenerative spondylosis of the lumbar spine, mild to moderate in degree. Mild ill-defined edema within the subcutaneous soft tissues of the abdomen and pelvis suggesting anasarca.  IMPRESSION: 1. Moderate to large pleural effusions bilaterally, RIGHT slightly greater than LEFT, with associated atelectasis bilaterally. 2. Cardiomegaly.  No evidence of associated pulmonary edema. 3. Thickening of the bladder walls circumferentially, suspicious for cystitis. Consider correlation with urinalysis. 4. Rectal vault is moderately distended with stool. Differential considerations include stercoral colitis, localized colitis of infectious or inflammatory nature, and reactive thickening secondary to the adjacent suspected cystitis. 5. Aortic atherosclerosis. 6. Probable anasarca. 7. Additional chronic/incidental findings detailed above. Electronically Signed   By: Franki Cabot M.D.   On: 01/16/2018 21:51   Ct Abdomen Pelvis W Contrast  Result Date: 01/16/2018 CLINICAL DATA:  Pleural effusion. Low hemoglobin. Pain with movement. EXAM: CT CHEST, ABDOMEN, AND PELVIS WITH CONTRAST TECHNIQUE: Multidetector CT imaging of the chest, abdomen and pelvis was performed following the standard protocol during bolus administration of  intravenous contrast. CONTRAST:  49mL ISOVUE-300 IOPAMIDOL (ISOVUE-300) INJECTION 61% COMPARISON:  CT abdomen dated 11/11/2017. FINDINGS: CT CHEST FINDINGS Cardiovascular: Cardiomegaly. No pericardial effusion. No thoracic aortic aneurysm or evidence of aortic dissection. Scattered aortic atherosclerosis. Prominent main pulmonary artery indicating chronic pulmonary artery hypertension. Mediastinum/Nodes: No mass or lymphadenopathy is appreciated within the mediastinum or perihilar regions. Esophagus is unremarkable. Trachea and central bronchi are unremarkable. Lungs/Pleura: Moderate to large pleural effusions bilaterally, RIGHT slightly greater than LEFT. Associated atelectasis bilaterally. No pneumothorax. Musculoskeletal: No acute or suspicious osseous finding. Median sternotomy wires in place. Degenerative changes noted at both shoulders, moderate to severe in degree. Mild degenerative spurring within the thoracic spine. CT ABDOMEN PELVIS FINDINGS Hepatobiliary: Liver contours are slightly nodular throughout suggesting cirrhosis. No focal liver abnormality is seen. Presumed cholecystectomy. No bile duct dilatation or bile duct stone seen. Pancreas: Unremarkable. Spleen: Normal in size without focal abnormality. Adrenals/Urinary Tract: Adrenal glands appear normal. LEFT renal cyst. No suspicious mass, stone or hydronephrosis bilaterally. Thickening of the walls of the bladder circumferentially. Stomach/Bowel: Rectal vault is moderately distended with stool. There is at least mild thickening of the walls of the rectosigmoid colon. No dilated large or small bowel loops. No additional site of bowel wall thickening or evidence of bowel wall inflammation. Appendix is not convincingly seen. Vascular/Lymphatic: Marked aortic atherosclerosis. Additional atherosclerosis at the origins of each of the aortic branch vessels. No acute appearing vascular abnormality. Reproductive: Presumed hysterectomy.  No adnexal mass seen.  Other: Small amount of free fluid in the pelvis. No abscess collection identified. No free intraperitoneal air. Musculoskeletal: No acute or suspicious osseous finding. Sclerotic changes of the femoral heads bilaterally suggesting chronic AVN. Degenerative spondylosis of the lumbar spine, mild to moderate in degree. Mild ill-defined edema within the subcutaneous soft tissues of the abdomen and pelvis suggesting anasarca. IMPRESSION: 1. Moderate to large pleural effusions bilaterally, RIGHT slightly greater than LEFT, with associated atelectasis bilaterally. 2. Cardiomegaly.  No evidence of associated pulmonary edema. 3. Thickening of the bladder walls circumferentially, suspicious for cystitis. Consider correlation with urinalysis. 4. Rectal vault is moderately distended with stool. Differential considerations include stercoral colitis, localized colitis of infectious or inflammatory nature, and reactive thickening secondary to the adjacent suspected cystitis. 5. Aortic atherosclerosis. 6. Probable anasarca. 7. Additional chronic/incidental findings detailed above. Electronically Signed   By: Franki Cabot M.D.   On: 01/16/2018 21:51    Procedures .Critical Care Performed by: Sharlett Iles, MD Authorized by: Sharlett Iles, MD   Critical care provider statement:    Critical care time (minutes):  30   Critical care time was exclusive of:  Separately billable procedures and  treating other patients   Critical care was necessary to treat or prevent imminent or life-threatening deterioration of the following conditions: anemia.   Critical care was time spent personally by me on the following activities:  Development of treatment plan with patient or surrogate, evaluation of patient's response to treatment, obtaining history from patient or surrogate, ordering and performing treatments and interventions, ordering and review of laboratory studies, ordering and review of radiographic studies,  review of old charts and re-evaluation of patient's condition   (including critical care time)  Medications Ordered in ED Medications  iopamidol (ISOVUE-300) 61 % injection (has no administration in time range)  carvedilol (COREG) tablet 12.5 mg (has no administration in time range)  ferrous sulfate tablet 325 mg (has no administration in time range)  furosemide (LASIX) tablet 10 mg (has no administration in time range)  levETIRAcetam (KEPPRA) tablet 500 mg (has no administration in time range)  levothyroxine (SYNTHROID, LEVOTHROID) tablet 100 mcg (has no administration in time range)  magnesium oxide (MAG-OX) tablet 400 mg (has no administration in time range)  oxyCODONE (Oxy IR/ROXICODONE) immediate release tablet 2.5 mg (has no administration in time range)  pantoprazole (PROTONIX) EC tablet 40 mg (has no administration in time range)  polyethylene glycol (MIRALAX / GLYCOLAX) packet 17 g (has no administration in time range)  potassium chloride SA (K-DUR,KLOR-CON) CR tablet 20 mEq (has no administration in time range)  senna-docusate (Senokot-S) tablet 2 tablet (has no administration in time range)  sertraline (ZOLOFT) tablet 75 mg (has no administration in time range)  ondansetron (ZOFRAN) tablet 4 mg (has no administration in time range)    Or  ondansetron (ZOFRAN) injection 4 mg (has no administration in time range)  enoxaparin (LOVENOX) injection 30 mg (has no administration in time range)  iopamidol (ISOVUE-300) 61 % injection 100 mL (80 mLs Intravenous Contrast Given 01/16/18 2113)  cefTRIAXone (ROCEPHIN) 1 g in sodium chloride 0.9 % 100 mL IVPB (0 g Intravenous Stopped 01/16/18 2226)  0.9 %  sodium chloride infusion (10 mL/hr Intravenous New Bag/Given 01/16/18 2212)     Initial Impression / Assessment and Plan / ED Course  I have reviewed the triage vital signs and the nursing notes.  Pertinent labs & imaging results that were available during my care of the patient were reviewed  by me and considered in my medical decision making (see chart for details).    She was pale and chronically ill appearing on exam, NAD. VSS.  Did have a generalized abdominal tenderness without peritonitis.  Show normal lactate, WBC 5, hemoglobin 6.9, UA consistent with infection.  Gave ceftriaxone and added culture.  Obtained imaging because of her tenderness which shows bilateral pleural effusions, cardiomegaly, bladder wall thickening suggestive of infection, constipation.  Ordered 1 unit of RBCs given her history of chronic anemia and hemoglobin less than 7.  Discussed admission with [facility] hospitalist, Dr. Hal Hope, and pt admitted for further treatment.   Final Clinical Impressions(s) / ED Diagnoses   Final diagnoses:  None    ED Discharge Orders    None       Little, Wenda Overland, MD 01/17/18 [phone]

## 2018-01-16 NOTE — ED Notes (Signed)
Called son Delfino Lovett to get consent for blood transfusion. Son consented and was verified by Nilsa Nutting, RN and myself

## 2018-01-16 NOTE — ED Notes (Signed)
Bed: WA09 Expected date:  Expected time:  Means of arrival:  Comments: EMS Low HGB 6.4

## 2018-01-16 NOTE — ED Notes (Signed)
Unable to get IV or labs. No other RN available to start [place] IV at this time. Lab is at bedside to attempt for blood draw only until staff available to try.

## 2018-01-16 NOTE — Progress Notes (Signed)
Lovenox per Pharmacy for DVT Prophylaxis    Pharmacy has been consulted from dosing enoxaparin (lovenox) in this patient for DVT prophylaxis.  The pharmacist has reviewed pertinent labs (Hgb 6.9___; PLT_120__), patient weight (__45_kg) and renal function (CrCl_38__mL/min) and decided that enoxaparin _30_mg SQ Q24Hrs is appropriate for this patient.  The pharmacy department will sign off at this time.  Please reconsult pharmacy if status changes or for further issues.  Thank you  Cyndia Diver PharmD, BCPS  01/16/2018, 11:14 PM

## 2018-01-16 NOTE — Progress Notes (Signed)
PALLIATIVE CARE CONSULT VISIT   PATIENT NAME: Jean Hodges DOB: [birth year] MRN: [id number]     REFERRING PROVIDER:      Dr. Theodis Blaze, PA  RESPONSIBLE PARTY:   Darden Dates)  [phone], Gus Height) [phone]   RECOMMENDATIONS and PLAN:  1.Severe protein-calorie malnutrition:  No improvement. Cachectic.  Varied intake during feedings.  Continue supplements and feedings by staff and family.  Improve hydration when possible.  Pending family decision to GI(Dr. [facility]) related to feeding tube placement.    Hospice appropriate.  2. Memory Loss R41.3:  FAST 7c.  Cognitive and functional decline related to advanced dementia. Varied verbal abilities.  Requiring additional assistance.  Expect additional further decline of disease process. Total supportive care  3. Advanced care planning and goals of care  Z71.89:   Long discussion with daughter via phone with updates re to pt's poor state of health and her trajectory of decline.  She also wants her to continue to receive blood transfusions due to chronic anemia/myelodysplastic syndrome as needed if it benefits her. Reviewed chronic illnesses with daughter.  Comfort/Hospice care services recommended to daughter again due to poor quality of life.  Palliative care will continue to followup after final decision related to feeding tube placement.  I spent 45 minutes providing this consultation at [facility],  from 11:15am to 11:45am. More than 50% of the time in this consultation was spent coordinating communication with [place], facility staff and daughter.   HISTORY OF PRESENT ILLNESS: Follow-up with [name] who remains in a chronic decline of health as related to advanced dementia and . She received a blood transfusion during recent hospitalization from 6/17-6/18. Cognitive deficit is advanced and she continues to gradually lose weight.  She had a GI consult on 01/07/18 for possible feeding tube  placement.  Pt. Is unable to make decisions for self due to normal confusion and cognitive deficits.  She requires total care in all aspects.  CODE STATUS: FULL CODE  PPS: 30% (Nutritional intake varies 30-100%)  HOSPICE ELIGIBILITY/DIAGNOSIS: YES:  Severe protein calorie malnutrition, advanced dementia.  Declined by family  PAST MEDICAL HISTORY:  Past Medical History:  Diagnosis Date  . Acute encephalopathy   . Acute encephalopathy 09/30/2016  . Alzheimer's dementia without behavioral disturbance   . Alzheimer's dementia without behavioral disturbance 10/07/2016  . Anemia   . Anemia 01/28/2014  . Atherosclerotic PVD with ulceration ([place])   . CAD (coronary artery disease)   . CAD (coronary artery disease) 01/04/2014  . Cellulitis   . Cellulitis of left leg   . Diabetes mellitus without complication ([place])   . DM (diabetes mellitus) ([place]) 01/27/2014  . HTN (hypertension) 01/04/2014  . Hyperlipidemia   . Hypertension   . Hypoglycemia associated with type 2 diabetes mellitus ([place])   . Macrocytic anemia   . Seizures ([place]) 09/30/2016  . Tear of left rotator cuff   . UTI (urinary tract infection)   . UTI (urinary tract infection) 09/25/2016    SOCIAL HX:  Social History   Tobacco Use  . Smoking status: Never Smoker  . Smokeless tobacco: Never Used  Substance Use Topics  . Alcohol use: No    ALLERGIES:  Allergies  Allergen Reactions  . Acetaminophen   . Codeine Nausea Only  . Dilaudid [Hydromorphone]   . Hydrocodone     Hallucinations  . Keflex [Cephalexin] Diarrhea     PERTINENT MEDICATIONS:  Outpatient Encounter Medications as of 12/01/2017  Medication Sig  . carvedilol (  COREG) 12.5 MG tablet Take 12.5 mg by mouth 2 (two) times daily with a meal. Hold for SBP<110 and/or pulse <60/min  . Cholecalciferol (VITAMIN D3) 2000 units TABS Take 2,000 Units by mouth daily.  . feeding supplement, ENSURE ENLIVE, (ENSURE ENLIVE) LIQD Take 237 mLs by mouth 3 (three) times daily  between meals. (Patient not taking: Reported on 11/03/2017)  . ferrous sulfate 325 (65 FE) MG tablet Take 325 mg by mouth 2 (two) times daily with a meal.  . furosemide (LASIX) 20 MG tablet Take 1 tablet (20 mg total) by mouth daily.  Marland Kitchen levETIRAcetam (KEPPRA) 500 MG tablet Take 500 mg by mouth 2 (two) times daily.  Marland Kitchen levothyroxine (SYNTHROID, LEVOTHROID) 100 MCG tablet Take 100 mcg by mouth daily before breakfast.  . mirtazapine (REMERON) 15 MG tablet Take 15 mg by mouth at bedtime.  . Multiple Vitamins-Minerals (DECUBI-VITE PO) Take 1 capsule by mouth daily.  Marland Kitchen nystatin (MYCOSTATIN) 100000 UNIT/ML suspension Take 5 mLs (500,000 Units total) by mouth 4 (four) times daily.  . ondansetron (ZOFRAN) 4 MG tablet Take 4 mg by mouth every 6 (six) hours as needed for nausea or vomiting.  . pantoprazole (PROTONIX) 40 MG tablet Take 1 tablet (40 mg total) by mouth daily.  . polyethylene glycol (MIRALAX / GLYCOLAX) packet Take 17 g by mouth daily.  . sennosides-docusate sodium (SENOKOT-S) 8.6-50 MG tablet Take 2 tablets by mouth 2 (two) times daily.  . sertraline (ZOLOFT) 50 MG tablet Take 75 mg by mouth daily.   No facility-administered encounter medications on file as of 12/01/2017.     PHYSICAL EXAM:   General: Cachectic Frail and fragile appearing elderly female resting in bed.  In NAD Cardiovascular: regular rate and rhythm Pulmonary: Clear anterior lung fields.  Unlabored respirations Abdomen: soft, nontender, + bowel sounds Extremities: R AKA.  No ecchymosis or edema Skin:  Very pale in color.  Reports of sacral decub but not visualized Neurological: Somnolent and aroused easily.  Oriented to name only.  Confused to specific details.  Follows commands      Gonzella Lex, NP-C

## 2018-01-16 NOTE — ED Triage Notes (Signed)
Per EMS, patient comes from [facility]. Labs were ordered today and sent over for low hemoglobin of 6.7. Brusing on both arms and patient c/o pain with movement. Alert and oriented x3.

## 2018-01-17 ENCOUNTER — Other Ambulatory Visit: Payer: Self-pay

## 2018-01-17 DIAGNOSIS — R195 Other fecal abnormalities: Secondary | ICD-10-CM | POA: Diagnosis not present

## 2018-01-17 DIAGNOSIS — G309 Alzheimer's disease, unspecified: Secondary | ICD-10-CM | POA: Diagnosis present

## 2018-01-17 DIAGNOSIS — Z7189 Other specified counseling: Secondary | ICD-10-CM | POA: Insufficient documentation

## 2018-01-17 DIAGNOSIS — E039 Hypothyroidism, unspecified: Secondary | ICD-10-CM | POA: Diagnosis present

## 2018-01-17 DIAGNOSIS — I10 Essential (primary) hypertension: Secondary | ICD-10-CM | POA: Diagnosis not present

## 2018-01-17 DIAGNOSIS — D649 Anemia, unspecified: Secondary | ICD-10-CM

## 2018-01-17 DIAGNOSIS — E43 Unspecified severe protein-calorie malnutrition: Secondary | ICD-10-CM

## 2018-01-17 DIAGNOSIS — Z881 Allergy status to other antibiotic agents status: Secondary | ICD-10-CM | POA: Diagnosis not present

## 2018-01-17 DIAGNOSIS — D696 Thrombocytopenia, unspecified: Secondary | ICD-10-CM | POA: Diagnosis present

## 2018-01-17 DIAGNOSIS — E1151 Type 2 diabetes mellitus with diabetic peripheral angiopathy without gangrene: Secondary | ICD-10-CM | POA: Diagnosis present

## 2018-01-17 DIAGNOSIS — I5022 Chronic systolic (congestive) heart failure: Secondary | ICD-10-CM | POA: Diagnosis present

## 2018-01-17 DIAGNOSIS — I110 Hypertensive heart disease with heart failure: Secondary | ICD-10-CM | POA: Diagnosis present

## 2018-01-17 DIAGNOSIS — R109 Unspecified abdominal pain: Secondary | ICD-10-CM | POA: Diagnosis present

## 2018-01-17 DIAGNOSIS — R64 Cachexia: Secondary | ICD-10-CM | POA: Diagnosis present

## 2018-01-17 DIAGNOSIS — Z886 Allergy status to analgesic agent status: Secondary | ICD-10-CM | POA: Diagnosis not present

## 2018-01-17 DIAGNOSIS — N390 Urinary tract infection, site not specified: Secondary | ICD-10-CM | POA: Diagnosis present

## 2018-01-17 DIAGNOSIS — Z79899 Other long term (current) drug therapy: Secondary | ICD-10-CM | POA: Diagnosis not present

## 2018-01-17 DIAGNOSIS — I480 Paroxysmal atrial fibrillation: Secondary | ICD-10-CM | POA: Diagnosis present

## 2018-01-17 DIAGNOSIS — Z79890 Hormone replacement therapy (postmenopausal): Secondary | ICD-10-CM | POA: Diagnosis not present

## 2018-01-17 DIAGNOSIS — F329 Major depressive disorder, single episode, unspecified: Secondary | ICD-10-CM | POA: Diagnosis present

## 2018-01-17 DIAGNOSIS — Z681 Body mass index (BMI) 19 or less, adult: Secondary | ICD-10-CM | POA: Diagnosis not present

## 2018-01-17 DIAGNOSIS — D469 Myelodysplastic syndrome, unspecified: Secondary | ICD-10-CM | POA: Diagnosis present

## 2018-01-17 DIAGNOSIS — F0280 Dementia in other diseases classified elsewhere without behavioral disturbance: Secondary | ICD-10-CM | POA: Diagnosis present

## 2018-01-17 DIAGNOSIS — R1084 Generalized abdominal pain: Secondary | ICD-10-CM | POA: Diagnosis not present

## 2018-01-17 DIAGNOSIS — Z89511 Acquired absence of right leg below knee: Secondary | ICD-10-CM | POA: Diagnosis not present

## 2018-01-17 DIAGNOSIS — Z885 Allergy status to narcotic agent status: Secondary | ICD-10-CM | POA: Diagnosis not present

## 2018-01-17 DIAGNOSIS — I2510 Atherosclerotic heart disease of native coronary artery without angina pectoris: Secondary | ICD-10-CM | POA: Diagnosis present

## 2018-01-17 DIAGNOSIS — G40909 Epilepsy, unspecified, not intractable, without status epilepticus: Secondary | ICD-10-CM | POA: Diagnosis present

## 2018-01-17 DIAGNOSIS — E785 Hyperlipidemia, unspecified: Secondary | ICD-10-CM | POA: Diagnosis present

## 2018-01-17 DIAGNOSIS — Z950 Presence of cardiac pacemaker: Secondary | ICD-10-CM | POA: Diagnosis not present

## 2018-01-17 LAB — CBC
HEMATOCRIT: 28.3 % — AB (ref 36.0–46.0)
HEMOGLOBIN: 9.5 g/dL — AB (ref 12.0–15.0)
MCH: 31.8 pg (ref 26.0–34.0)
MCHC: 33.6 g/dL (ref 30.0–36.0)
MCV: 94.6 fL (ref 78.0–100.0)
Platelets: 133 10*3/uL — ABNORMAL LOW (ref 150–400)
RBC: 2.99 MIL/uL — ABNORMAL LOW (ref 3.87–5.11)
RDW: 18.6 % — AB (ref 11.5–15.5)
WBC: 7.5 10*3/uL (ref 4.0–10.5)

## 2018-01-17 LAB — BASIC METABOLIC PANEL
ANION GAP: 7 (ref 5–15)
BUN: 22 mg/dL (ref 8–23)
CHLORIDE: 105 mmol/L (ref 98–111)
CO2: 28 mmol/L (ref 22–32)
Calcium: 8.8 mg/dL — ABNORMAL LOW (ref 8.9–10.3)
Creatinine, Ser: 0.57 mg/dL (ref 0.44–1.00)
GFR calc Af Amer: >60 mL/min (ref >60)
GFR calc non Af Amer: >60 mL/min (ref >60)
Glucose, Bld: 85 mg/dL (ref 70–99)
POTASSIUM: 5.5 mmol/L — AB (ref 3.5–5.1)
SODIUM: 140 mmol/L (ref 135–145)

## 2018-01-17 LAB — PREPARE RBC (CROSSMATCH)

## 2018-01-17 MED ORDER — METRONIDAZOLE IN NACL 5-0.79 MG/ML-% IV SOLN
500.0000 mg | Freq: Three times a day (TID) | INTRAVENOUS | Status: DC
  Administered 2018-01-17 (×2): 500 mg via INTRAVENOUS
  Filled 2018-01-17 (×2): qty 100

## 2018-01-17 MED ORDER — LIP MEDEX EX OINT: TOPICAL_OINTMENT | CUTANEOUS | Status: DC | PRN

## 2018-01-17 MED ORDER — POLYETHYLENE GLYCOL 3350 17 G PO PACK
17.0000 g | PACK | Freq: Two times a day (BID) | ORAL | Status: DC
  Administered 2018-01-17 (×2): 17 g via ORAL
  Filled 2018-01-17 (×2): qty 1

## 2018-01-17 MED ORDER — SORBITOL 70 % SOLN
960.0000 mL | TOPICAL_OIL | Freq: Once | ORAL | Status: AC
  Administered 2018-01-17: 960 mL via RECTAL
  Filled 2018-01-17: qty 473

## 2018-01-17 MED ORDER — ENSURE ENLIVE PO LIQD
237.0000 mL | Freq: Two times a day (BID) | ORAL | Status: DC
  Administered 2018-01-17 – 2018-01-19 (×3): 237 mL via ORAL

## 2018-01-17 MED ORDER — HALOPERIDOL LACTATE 5 MG/ML IJ SOLN: 1.0000 mg | Freq: Four times a day (QID) | INTRAMUSCULAR | Status: DC | PRN

## 2018-01-17 MED ORDER — SODIUM CHLORIDE 0.9 % IV SOLN
1.0000 g | INTRAVENOUS | Status: DC
  Administered 2018-01-17 – 2018-01-18 (×2): 1 g via INTRAVENOUS
  Filled 2018-01-17 (×2): qty 1

## 2018-01-17 NOTE — Progress Notes (Signed)
[facility] PROGRESS NOTE    Progress Note  Jean Hodges  RDE:[id number] DOB: [birth year] DOA: 01/16/2018 PCP: Patient, No Pcp Per     Brief Narrative:   Jean Hodges is an 84 y.o. female past medical history of paroxysmal atrial fibrillation, chronic systolic heart failure and chronic anemia with thrombocytopenia active myelodysplastic syndrome but the patient family has refused bone marrow biopsy referred to the ED as his hemoglobin was found to be around 6.7 at facility  Assessment/Plan:  Symptomatic anemia: Patient has a known history of chronic anemia and thrombocytopenia but she has refused a bone marrow biopsy. She is s/p 2 unit PRBC CBC posttransfusion is 9.5.  Abdominal pain: CT scan of the abdomen and pelvis showed large stool burden we will give her a small dog and start her on MiraLAX p.o. twice daily. Also her UA showed 21-50 white blood cells and CT scan also showed inflammation of the bladder which makes me concerned for possible UTI urine cultures have been sent. Continue IV Rocephin.  Chronic systolic heart failure: With an EF of 20% continue oral Lasix.  Hypothyroidism: Continue Synthroid.  Depression: Continue Zoloft.  Paroxysmal atrial fibrillation: Rate controlled not on anticoagulation as she is not a candidate, continue Coreg.  Seizure disorder: Continue Keppra.  Essential hypertension: Well-controlled continue current treatment.  Severe protein-calorie malnutrition ([place]): Ensure TID.  Dementia without behavioral disturbances: Iv ha.ldol for agitation.  DVT prophylaxis: lovenxo Family Communication:none Disposition Plan/Barrier to D/C: home 1-2 days Code Status:     Code Status Orders  (From admission, onward)        Start     Ordered   01/16/18 2308  Full code  Continuous     01/16/18 2308    Code Status History    Date Active Date Inactive Code Status Order ID Comments User Context   12/29/2017 2110 12/30/2017 2059 Full Code  [id number]  Rise Patience, MD ED   11/03/2017 1648 11/14/2017 1942 Full Code [id number]  Kayleen Memos, DO ED   10/09/2017 2031 10/10/2017 1831 Full Code [id number]  Norval Morton, MD ED   07/23/2017 1509 07/24/2017 1809 Full Code [id number]  Phillips Grout, MD Inpatient   11/27/2016 2203 11/28/2016 2154 Full Code [id number]  Rise Patience, MD Inpatient        IV Access:    Peripheral IV   Procedures and diagnostic studies:   Ct Chest W Contrast  Result Date: 01/16/2018 CLINICAL DATA:  Pleural effusion. Low hemoglobin. Pain with movement. EXAM: CT CHEST, ABDOMEN, AND PELVIS WITH CONTRAST TECHNIQUE: Multidetector CT imaging of the chest, abdomen and pelvis was performed following the standard protocol during bolus administration of intravenous contrast. CONTRAST:  6m ISOVUE-300 IOPAMIDOL (ISOVUE-300) INJECTION 61% COMPARISON:  CT abdomen dated 11/11/2017. FINDINGS: CT CHEST FINDINGS Cardiovascular: Cardiomegaly. No pericardial effusion. No thoracic aortic aneurysm or evidence of aortic dissection. Scattered aortic atherosclerosis. Prominent main pulmonary artery indicating chronic pulmonary artery hypertension. Mediastinum/Nodes: No mass or lymphadenopathy is appreciated within the mediastinum or perihilar regions. Esophagus is unremarkable. Trachea and central bronchi are unremarkable. Lungs/Pleura: Moderate to large pleural effusions bilaterally, RIGHT slightly greater than LEFT. Associated atelectasis bilaterally. No pneumothorax. Musculoskeletal: No acute or suspicious osseous finding. Median sternotomy wires in place. Degenerative changes noted at both shoulders, moderate to severe in degree. Mild degenerative spurring within the thoracic spine. CT ABDOMEN PELVIS FINDINGS Hepatobiliary: Liver contours are slightly nodular throughout suggesting cirrhosis. No focal liver abnormality is seen. Presumed cholecystectomy. No bile duct dilatation or  bile duct stone seen. Pancreas: Unremarkable.  Spleen: Normal in size without focal abnormality. Adrenals/Urinary Tract: Adrenal glands appear normal. LEFT renal cyst. No suspicious mass, stone or hydronephrosis bilaterally. Thickening of the walls of the bladder circumferentially. Stomach/Bowel: Rectal vault is moderately distended with stool. There is at least mild thickening of the walls of the rectosigmoid colon. No dilated large or small bowel loops. No additional site of bowel wall thickening or evidence of bowel wall inflammation. Appendix is not convincingly seen. Vascular/Lymphatic: Marked aortic atherosclerosis. Additional atherosclerosis at the origins of each of the aortic branch vessels. No acute appearing vascular abnormality. Reproductive: Presumed hysterectomy.  No adnexal mass seen. Other: Small amount of free fluid in the pelvis. No abscess collection identified. No free intraperitoneal air. Musculoskeletal: No acute or suspicious osseous finding. Sclerotic changes of the femoral heads bilaterally suggesting chronic AVN. Degenerative spondylosis of the lumbar spine, mild to moderate in degree. Mild ill-defined edema within the subcutaneous soft tissues of the abdomen and pelvis suggesting anasarca. IMPRESSION: 1. Moderate to large pleural effusions bilaterally, RIGHT slightly greater than LEFT, with associated atelectasis bilaterally. 2. Cardiomegaly.  No evidence of associated pulmonary edema. 3. Thickening of the bladder walls circumferentially, suspicious for cystitis. Consider correlation with urinalysis. 4. Rectal vault is moderately distended with stool. Differential considerations include stercoral colitis, localized colitis of infectious or inflammatory nature, and reactive thickening secondary to the adjacent suspected cystitis. 5. Aortic atherosclerosis. 6. Probable anasarca. 7. Additional chronic/incidental findings detailed above. Electronically Signed   By: Franki Cabot M.D.   On: 01/16/2018 21:51   Ct Abdomen Pelvis W  Contrast  Result Date: 01/16/2018 CLINICAL DATA:  Pleural effusion. Low hemoglobin. Pain with movement. EXAM: CT CHEST, ABDOMEN, AND PELVIS WITH CONTRAST TECHNIQUE: Multidetector CT imaging of the chest, abdomen and pelvis was performed following the standard protocol during bolus administration of intravenous contrast. CONTRAST:  75m ISOVUE-300 IOPAMIDOL (ISOVUE-300) INJECTION 61% COMPARISON:  CT abdomen dated 11/11/2017. FINDINGS: CT CHEST FINDINGS Cardiovascular: Cardiomegaly. No pericardial effusion. No thoracic aortic aneurysm or evidence of aortic dissection. Scattered aortic atherosclerosis. Prominent main pulmonary artery indicating chronic pulmonary artery hypertension. Mediastinum/Nodes: No mass or lymphadenopathy is appreciated within the mediastinum or perihilar regions. Esophagus is unremarkable. Trachea and central bronchi are unremarkable. Lungs/Pleura: Moderate to large pleural effusions bilaterally, RIGHT slightly greater than LEFT. Associated atelectasis bilaterally. No pneumothorax. Musculoskeletal: No acute or suspicious osseous finding. Median sternotomy wires in place. Degenerative changes noted at both shoulders, moderate to severe in degree. Mild degenerative spurring within the thoracic spine. CT ABDOMEN PELVIS FINDINGS Hepatobiliary: Liver contours are slightly nodular throughout suggesting cirrhosis. No focal liver abnormality is seen. Presumed cholecystectomy. No bile duct dilatation or bile duct stone seen. Pancreas: Unremarkable. Spleen: Normal in size without focal abnormality. Adrenals/Urinary Tract: Adrenal glands appear normal. LEFT renal cyst. No suspicious mass, stone or hydronephrosis bilaterally. Thickening of the walls of the bladder circumferentially. Stomach/Bowel: Rectal vault is moderately distended with stool. There is at least mild thickening of the walls of the rectosigmoid colon. No dilated large or small bowel loops. No additional site of bowel wall thickening or  evidence of bowel wall inflammation. Appendix is not convincingly seen. Vascular/Lymphatic: Marked aortic atherosclerosis. Additional atherosclerosis at the origins of each of the aortic branch vessels. No acute appearing vascular abnormality. Reproductive: Presumed hysterectomy.  No adnexal mass seen. Other: Small amount of free fluid in the pelvis. No abscess collection identified. No free intraperitoneal air. Musculoskeletal: No acute or suspicious osseous finding. Sclerotic changes of the  femoral heads bilaterally suggesting chronic AVN. Degenerative spondylosis of the lumbar spine, mild to moderate in degree. Mild ill-defined edema within the subcutaneous soft tissues of the abdomen and pelvis suggesting anasarca. IMPRESSION: 1. Moderate to large pleural effusions bilaterally, RIGHT slightly greater than LEFT, with associated atelectasis bilaterally. 2. Cardiomegaly.  No evidence of associated pulmonary edema. 3. Thickening of the bladder walls circumferentially, suspicious for cystitis. Consider correlation with urinalysis. 4. Rectal vault is moderately distended with stool. Differential considerations include stercoral colitis, localized colitis of infectious or inflammatory nature, and reactive thickening secondary to the adjacent suspected cystitis. 5. Aortic atherosclerosis. 6. Probable anasarca. 7. Additional chronic/incidental findings detailed above. Electronically Signed   By: Franki Cabot M.D.   On: 01/16/2018 21:51     Medical Consultants:    None.  Anti-Infectives:   Iv rocephin  Subjective:    Jean Hodges no compalins  Objective:    Vitals:   01/17/18 0046 01/17/18 0108 01/17/18 0337 01/17/18 0532  BP: 104/66 (!) 134/52 (!) 135/59 (!) 141/61  Pulse: 78 78 78 81  Resp: _0 Temp: 98.3 F (36.8 C) 98.3 F (36.8 C) 98.2 F (36.8 C) 98.3 F (36.8 C)  TempSrc: Oral   Oral  SpO2: 99% 99% 100% 100%  Weight:    47.2 kg (104 lb 0.9 oz)  Height:         Intake/Output Summary (Last 24 hours) at 01/17/2018 0738 Last data filed at 01/17/2018 0338 Gross per 24 hour  Intake 470 ml  Output 200 ml  Net 270 ml   Filed Weights   01/16/18 1727 01/17/18 0532  Weight: 45.4 kg (100 lb) 47.2 kg (104 lb 0.9 oz)    Exam: General exam: In no acute distress, cachectic Respiratory system: Good air movement and clear to auscultation. Cardiovascular system: S1 & S2 heard, RRR.  Gastrointestinal system: Abdomen is nondistended, soft and nontender.  Central nervous system: Alert and oriented. No focal neurological deficits. Extremities: No pedal edema. Skin: No rashes, lesions or ulcers Psychiatry: Judgement and insight appear normal. Mood & affect appropriate.    Data Reviewed:    Labs: Basic Metabolic Panel: Recent Labs  Lab 01/16/18 1804 01/17/18 0528  NA 141 140  K 5.0 5.5*  CL 107 105  CO2 30 28  GLUCOSE 133* 85  BUN 24* 22  CREATININE 0.69 0.57  CALCIUM 8.6* 8.8*   GFR Estimated Creatinine Clearance: 40.4 mL/min (by C-G formula based on SCr of 0.57 mg/dL). Liver Function Tests: Recent Labs  Lab 01/16/18 1804  AST 31  ALT 35  ALKPHOS 88  BILITOT 0.2*  PROT 5.2*  ALBUMIN 2.2*   Recent Labs  Lab 01/16/18 1804  LIPASE 26   No results for input(s): AMMONIA in the last 168 hours. Coagulation profile No results for input(s): INR, PROTIME in the last 168 hours.  CBC: Recent Labs  Lab 01/16/18 1804 01/17/18 0528  WBC 5.1 7.5  NEUTROABS 3.4  --   HGB 6.9* 9.5*  HCT 21.1* 28.3*  MCV 98.6 94.6  PLT 120* 133*   Cardiac Enzymes: No results for input(s): CKTOTAL, CKMB, CKMBINDEX, TROPONINI in the last 168 hours. BNP (last 3 results) No results for input(s): PROBNP in the last 8760 hours. CBG: No results for input(s): GLUCAP in the last 168 hours. D-Dimer: No results for input(s): DDIMER in the last 72 hours. Hgb A1c: No results for input(s): HGBA1C in the last 72 hours. Lipid Profile: No results for input(s):  CHOL,  HDL, LDLCALC, TRIG, CHOLHDL, LDLDIRECT in the last 72 hours. Thyroid function studies: No results for input(s): TSH, T4TOTAL, T3FREE, THYROIDAB in the last 72 hours.  Invalid input(s): FREET3 Anemia work up: No results for input(s): VITAMINB12, FOLATE, FERRITIN, TIBC, IRON, RETICCTPCT in the last 72 hours. Sepsis Labs: Recent Labs  Lab 01/16/18 1804 01/16/18 1819 01/16/18 2021 01/17/18 0528  WBC 5.1  --   --  7.5  LATICACIDVEN  --  0.65 0.81  --    Microbiology No results found for this or any previous visit (from the past 240 hour(s)).   Medications:   . carvedilol  12.5 mg Oral BID WC  . enoxaparin (LOVENOX) injection  30 mg Subcutaneous QHS  . ferrous sulfate  325 mg Oral BID WC  . furosemide  10 mg Oral Daily  . iopamidol      . levETIRAcetam  500 mg Oral BID  . levothyroxine  100 mcg Oral QAC breakfast  . magnesium oxide  400 mg Oral Daily  . pantoprazole  40 mg Oral Daily  . polyethylene glycol  17 g Oral Daily  . potassium chloride SA  20 mEq Oral Daily  . senna-docusate  2 tablet Oral BID  . sertraline  75 mg Oral Daily   Continuous Infusions: . cefTRIAXone (ROCEPHIN)  IV    . metronidazole 500 mg (01/17/18 0631)     LOS: 0 days   Charlynne Cousins  [facility] Pager [phone]  *Please refer to [email or web address], password [email or web address] to get updated schedule on who will round on this patient, as hospitalists switch teams weekly. If 7PM-7AM, please contact night-coverage at [email or web address], password [email or web address] for any overnight needs.  01/17/2018, 7:38 AM

## 2018-01-17 NOTE — Progress Notes (Signed)
No longer place precautions for MRSA PCR

## 2018-01-17 NOTE — Progress Notes (Signed)
PT Cancellation Note  Patient Details Name: Jean Hodges MRN: [id number] DOB: 1933-08-11   Cancelled Treatment:    Reason Eval/Treat Not Completed: PT screened, no needs identified, will sign off Pt is a resident of SNF, has advanced cognitive deficits, and requires total care.  Per chart review, pt receiving palliative care at SNF.  Pt does not appear appropriate for skilled PT services.  PT to sign off.   Jalyric Kaestner,KATHrine E 01/17/2018, 11:31 AM Carmelia Bake, PT, DPT 01/17/2018 Pager: [phone]

## 2018-01-17 NOTE — Progress Notes (Signed)
Prs son and his family at the bedside visititng.

## 2018-01-18 ENCOUNTER — Inpatient Hospital Stay (HOSPITAL_COMMUNITY): Payer: Medicare Other

## 2018-01-18 MED ORDER — SODIUM POLYSTYRENE SULFONATE 15 GM/60ML PO SUSP
15.0000 g | Freq: Once | ORAL | Status: AC
  Administered 2018-01-18: 15 g via ORAL
  Filled 2018-01-18: qty 60

## 2018-01-18 MED ORDER — POLYETHYLENE GLYCOL 3350 17 G PO PACK
17.0000 g | PACK | Freq: Two times a day (BID) | ORAL | Status: AC
  Administered 2018-01-18 (×2): 17 g via ORAL
  Filled 2018-01-18 (×2): qty 1

## 2018-01-18 MED ORDER — FUROSEMIDE 20 MG PO TABS: 20.0000 mg | ORAL_TABLET | Freq: Every day | ORAL | Status: DC

## 2018-01-18 MED ORDER — SODIUM CHLORIDE 0.9 % IV SOLN
INTRAVENOUS | Status: AC
  Administered 2018-01-18 – 2018-01-19 (×2): via INTRAVENOUS

## 2018-01-18 NOTE — Progress Notes (Signed)
[facility] PROGRESS NOTE    Progress Note  Jean Hodges  ZOX:[id number] DOB: 1934-02-25 DOA: 01/16/2018 PCP: Patient, No Pcp Per     Brief Narrative:   Jean Hodges is an 84 y.o. female past medical history of paroxysmal atrial fibrillation, chronic systolic heart failure and chronic anemia with thrombocytopenia active myelodysplastic syndrome but the patient family has refused bone marrow biopsy referred to the ED as his hemoglobin was found to be around 6.7 at facility  Assessment/Plan:  Symptomatic anemia: Patient has a known history of chronic anemia and thrombocytopenia but she has refused a bone marrow biopsy. She is s/p 2 unit PRBC CBC posttransfusion is 9.5.  Abdominal pain: CT scan of the abdomen and pelvis showed large stool burden we will give her a small dog and start her on MiraLAX p.o. twice daily. Repeated abdominal x-ray showed improvement in stool burden.  Continue MiraLAX twice daily.  Possible UTI: Continue IV Rocephin. UA showed 21-50 white blood cells with CT scan showed inflammation of her bladder which point source possible urinary tract infection. Urine cultures showed more than 100,000 colonies of unspecified organism.  Chronic systolic heart failure: With an EF of 20%, hold Lasix.  Hypothyroidism: Continue Synthroid.  Depression: Continue Zoloft.  Paroxysmal atrial fibrillation: Rate controlled not on anticoagulation as she is not a candidate, continue Coreg.  Seizure disorder: Continue Keppra.  Essential hypertension: Well-controlled continue current treatment.  Severe protein-calorie malnutrition ([place]): Ensure TID.  Dementia without behavioral disturbances: Iv ha.ldol for agitation.  New hyperkalemia: We will give oral Kayexalate is likely due to transfusions and oral supplementation. Recheck a basic metabolic panel in the morning.  DVT prophylaxis: lovenxo Family Communication:none Disposition Plan/Barrier to D/C: home 1-2  days Code Status:     Code Status Orders  (From admission, onward)        Start     Ordered   01/16/18 2308  Full code  Continuous     01/16/18 2308    Code Status History    Date Active Date Inactive Code Status Order ID Comments User Context   12/29/2017 2110 12/30/2017 2059 Full Code [id number]  Rise Patience, MD ED   11/03/2017 1648 11/14/2017 1942 Full Code [id number]  Kayleen Memos, DO ED   10/09/2017 2031 10/10/2017 1831 Full Code [id number]  Norval Morton, MD ED   07/23/2017 1509 07/24/2017 1809 Full Code [id number]  Phillips Grout, MD Inpatient   11/27/2016 2203 11/28/2016 2154 Full Code [id number]  Rise Patience, MD Inpatient        IV Access:    Peripheral IV   Procedures and diagnostic studies:   Dg Abd 1 View  Result Date: 01/18/2018 CLINICAL DATA:  Large stool EXAM: ABDOMEN - 1 VIEW COMPARISON:  CT abdomen and pelvis 01/16/2018 FINDINGS: Gas and stool demonstrated throughout the colon. No small or large bowel distention. Visualization of the rectum is limited due to residual contrast material in the bladder. No radiopaque stones. Diffuse vascular calcifications. Degenerative changes in the spine and hips. Cardiac enlargement with bilateral pleural effusions. IMPRESSION: Nonobstructive bowel gas pattern with gas and stool in the colon. Bilateral pleural effusions. Electronically Signed   By: Lucienne Capers M.D.   On: 01/18/2018 06:41   Ct Chest W Contrast  Result Date: 01/16/2018 CLINICAL DATA:  Pleural effusion. Low hemoglobin. Pain with movement. EXAM: CT CHEST, ABDOMEN, AND PELVIS WITH CONTRAST TECHNIQUE: Multidetector CT imaging of the chest, abdomen and pelvis was performed following the standard protocol during  bolus administration of intravenous contrast. CONTRAST:  34m ISOVUE-300 IOPAMIDOL (ISOVUE-300) INJECTION 61% COMPARISON:  CT abdomen dated 11/11/2017. FINDINGS: CT CHEST FINDINGS Cardiovascular: Cardiomegaly. No pericardial effusion. No thoracic  aortic aneurysm or evidence of aortic dissection. Scattered aortic atherosclerosis. Prominent main pulmonary artery indicating chronic pulmonary artery hypertension. Mediastinum/Nodes: No mass or lymphadenopathy is appreciated within the mediastinum or perihilar regions. Esophagus is unremarkable. Trachea and central bronchi are unremarkable. Lungs/Pleura: Moderate to large pleural effusions bilaterally, RIGHT slightly greater than LEFT. Associated atelectasis bilaterally. No pneumothorax. Musculoskeletal: No acute or suspicious osseous finding. Median sternotomy wires in place. Degenerative changes noted at both shoulders, moderate to severe in degree. Mild degenerative spurring within the thoracic spine. CT ABDOMEN PELVIS FINDINGS Hepatobiliary: Liver contours are slightly nodular throughout suggesting cirrhosis. No focal liver abnormality is seen. Presumed cholecystectomy. No bile duct dilatation or bile duct stone seen. Pancreas: Unremarkable. Spleen: Normal in size without focal abnormality. Adrenals/Urinary Tract: Adrenal glands appear normal. LEFT renal cyst. No suspicious mass, stone or hydronephrosis bilaterally. Thickening of the walls of the bladder circumferentially. Stomach/Bowel: Rectal vault is moderately distended with stool. There is at least mild thickening of the walls of the rectosigmoid colon. No dilated large or small bowel loops. No additional site of bowel wall thickening or evidence of bowel wall inflammation. Appendix is not convincingly seen. Vascular/Lymphatic: Marked aortic atherosclerosis. Additional atherosclerosis at the origins of each of the aortic branch vessels. No acute appearing vascular abnormality. Reproductive: Presumed hysterectomy.  No adnexal mass seen. Other: Small amount of free fluid in the pelvis. No abscess collection identified. No free intraperitoneal air. Musculoskeletal: No acute or suspicious osseous finding. Sclerotic changes of the femoral heads bilaterally  suggesting chronic AVN. Degenerative spondylosis of the lumbar spine, mild to moderate in degree. Mild ill-defined edema within the subcutaneous soft tissues of the abdomen and pelvis suggesting anasarca. IMPRESSION: 1. Moderate to large pleural effusions bilaterally, RIGHT slightly greater than LEFT, with associated atelectasis bilaterally. 2. Cardiomegaly.  No evidence of associated pulmonary edema. 3. Thickening of the bladder walls circumferentially, suspicious for cystitis. Consider correlation with urinalysis. 4. Rectal vault is moderately distended with stool. Differential considerations include stercoral colitis, localized colitis of infectious or inflammatory nature, and reactive thickening secondary to the adjacent suspected cystitis. 5. Aortic atherosclerosis. 6. Probable anasarca. 7. Additional chronic/incidental findings detailed above. Electronically Signed   By: SFranki CabotM.D.   On: 01/16/2018 21:51   Ct Abdomen Pelvis W Contrast  Result Date: 01/16/2018 CLINICAL DATA:  Pleural effusion. Low hemoglobin. Pain with movement. EXAM: CT CHEST, ABDOMEN, AND PELVIS WITH CONTRAST TECHNIQUE: Multidetector CT imaging of the chest, abdomen and pelvis was performed following the standard protocol during bolus administration of intravenous contrast. CONTRAST:  826mISOVUE-300 IOPAMIDOL (ISOVUE-300) INJECTION 61% COMPARISON:  CT abdomen dated 11/11/2017. FINDINGS: CT CHEST FINDINGS Cardiovascular: Cardiomegaly. No pericardial effusion. No thoracic aortic aneurysm or evidence of aortic dissection. Scattered aortic atherosclerosis. Prominent main pulmonary artery indicating chronic pulmonary artery hypertension. Mediastinum/Nodes: No mass or lymphadenopathy is appreciated within the mediastinum or perihilar regions. Esophagus is unremarkable. Trachea and central bronchi are unremarkable. Lungs/Pleura: Moderate to large pleural effusions bilaterally, RIGHT slightly greater than LEFT. Associated atelectasis  bilaterally. No pneumothorax. Musculoskeletal: No acute or suspicious osseous finding. Median sternotomy wires in place. Degenerative changes noted at both shoulders, moderate to severe in degree. Mild degenerative spurring within the thoracic spine. CT ABDOMEN PELVIS FINDINGS Hepatobiliary: Liver contours are slightly nodular throughout suggesting cirrhosis. No focal liver abnormality is seen. Presumed cholecystectomy. No  bile duct dilatation or bile duct stone seen. Pancreas: Unremarkable. Spleen: Normal in size without focal abnormality. Adrenals/Urinary Tract: Adrenal glands appear normal. LEFT renal cyst. No suspicious mass, stone or hydronephrosis bilaterally. Thickening of the walls of the bladder circumferentially. Stomach/Bowel: Rectal vault is moderately distended with stool. There is at least mild thickening of the walls of the rectosigmoid colon. No dilated large or small bowel loops. No additional site of bowel wall thickening or evidence of bowel wall inflammation. Appendix is not convincingly seen. Vascular/Lymphatic: Marked aortic atherosclerosis. Additional atherosclerosis at the origins of each of the aortic branch vessels. No acute appearing vascular abnormality. Reproductive: Presumed hysterectomy.  No adnexal mass seen. Other: Small amount of free fluid in the pelvis. No abscess collection identified. No free intraperitoneal air. Musculoskeletal: No acute or suspicious osseous finding. Sclerotic changes of the femoral heads bilaterally suggesting chronic AVN. Degenerative spondylosis of the lumbar spine, mild to moderate in degree. Mild ill-defined edema within the subcutaneous soft tissues of the abdomen and pelvis suggesting anasarca. IMPRESSION: 1. Moderate to large pleural effusions bilaterally, RIGHT slightly greater than LEFT, with associated atelectasis bilaterally. 2. Cardiomegaly.  No evidence of associated pulmonary edema. 3. Thickening of the bladder walls circumferentially,  suspicious for cystitis. Consider correlation with urinalysis. 4. Rectal vault is moderately distended with stool. Differential considerations include stercoral colitis, localized colitis of infectious or inflammatory nature, and reactive thickening secondary to the adjacent suspected cystitis. 5. Aortic atherosclerosis. 6. Probable anasarca. 7. Additional chronic/incidental findings detailed above. Electronically Signed   By: Franki Cabot M.D.   On: 01/16/2018 21:51     Medical Consultants:    None.  Anti-Infectives:   Iv rocephin  Subjective:    Jean Hodges no compalins, she is she relates she feels better than yesterday.  Objective:    Vitals:   01/17/18 1350 01/17/18 2048 01/18/18 0533 01/18/18 0744  BP: (!) 114/49 (!) 123/54 (!) 135/54 (!) 131/58  Pulse: 78 79 80 81  Resp: _0 Temp: 98.7 F (37.1 C) 98.7 F (37.1 C) 98.1 F (36.7 C)   TempSrc: Oral Oral Oral   SpO2: 99% 100% 99% 99%  Weight:   48.9 kg (107 lb 12.9 oz)   Height:        Intake/Output Summary (Last 24 hours) at 01/18/2018 0843 Last data filed at 01/18/2018 0534 Gross per 24 hour  Intake 730 ml  Output 350 ml  Net 380 ml   Filed Weights   01/16/18 1727 01/17/18 0532 01/18/18 0533  Weight: 45.4 kg (100 lb) 47.2 kg (104 lb 0.9 oz) 48.9 kg (107 lb 12.9 oz)    Exam: General exam: In no acute distress, cachectic Respiratory system: Good air movement and clear to auscultation. Cardiovascular system: S1 & S2 heard, RRR.  Gastrointestinal system: Abdomen is nondistended, soft and nontender.  Central nervous system: Alert and oriented. No focal neurological deficits. Extremities: No pedal edema. Skin: No rashes, lesions or ulcers Psychiatry: Judgement and insight appear normal. Mood & affect appropriate.    Data Reviewed:    Labs: Basic Metabolic Panel: Recent Labs  Lab 01/16/18 1804 01/17/18 0528  NA 141 140  K 5.0 5.5*  CL 107 105  CO2 30 28  GLUCOSE 133* 85  BUN 24* 22   CREATININE 0.69 0.57  CALCIUM 8.6* 8.8*   GFR Estimated Creatinine Clearance: 41.9 mL/min (by C-G formula based on SCr of 0.57 mg/dL). Liver Function Tests: Recent Labs  Lab 01/16/18 1804  AST 31  ALT 35  ALKPHOS 88  BILITOT 0.2*  PROT 5.2*  ALBUMIN 2.2*   Recent Labs  Lab 01/16/18 1804  LIPASE 26   No results for input(s): AMMONIA in the last 168 hours. Coagulation profile No results for input(s): INR, PROTIME in the last 168 hours.  CBC: Recent Labs  Lab 01/16/18 1804 01/17/18 0528  WBC 5.1 7.5  NEUTROABS 3.4  --   HGB 6.9* 9.5*  HCT 21.1* 28.3*  MCV 98.6 94.6  PLT 120* 133*   Cardiac Enzymes: No results for input(s): CKTOTAL, CKMB, CKMBINDEX, TROPONINI in the last 168 hours. BNP (last 3 results) No results for input(s): PROBNP in the last 8760 hours. CBG: No results for input(s): GLUCAP in the last 168 hours. D-Dimer: No results for input(s): DDIMER in the last 72 hours. Hgb A1c: No results for input(s): HGBA1C in the last 72 hours. Lipid Profile: No results for input(s): CHOL, HDL, LDLCALC, TRIG, CHOLHDL, LDLDIRECT in the last 72 hours. Thyroid function studies: No results for input(s): TSH, T4TOTAL, T3FREE, THYROIDAB in the last 72 hours.  Invalid input(s): FREET3 Anemia work up: No results for input(s): VITAMINB12, FOLATE, FERRITIN, TIBC, IRON, RETICCTPCT in the last 72 hours. Sepsis Labs: Recent Labs  Lab 01/16/18 1804 01/16/18 1819 01/16/18 2021 01/17/18 0528  WBC 5.1  --   --  7.5  LATICACIDVEN  --  0.65 0.81  --    Microbiology No results found for this or any previous visit (from the past 240 hour(s)).   Medications:   . carvedilol  12.5 mg Oral BID WC  . enoxaparin (LOVENOX) injection  30 mg Subcutaneous QHS  . feeding supplement (ENSURE ENLIVE)  237 mL Oral BID BM  . ferrous sulfate  325 mg Oral BID WC  . furosemide  20 mg Oral Daily  . levETIRAcetam  500 mg Oral BID  . levothyroxine  100 mcg Oral QAC breakfast  . magnesium  oxide  400 mg Oral Daily  . pantoprazole  40 mg Oral Daily  . polyethylene glycol  17 g Oral BID  . senna-docusate  2 tablet Oral BID  . sertraline  75 mg Oral Daily  . sodium polystyrene  15 g Oral Once   Continuous Infusions: . cefTRIAXone (ROCEPHIN)  IV 1 g (01/17/18 2138)     LOS: 1 day   Charlynne Cousins  [facility] Pager [phone]  *Please refer to [email or web address], password [email or web address] to get updated schedule on who will round on this patient, as hospitalists switch teams weekly. If 7PM-7AM, please contact night-coverage at [email or web address], password [email or web address] for any overnight needs.  01/18/2018, 8:43 AM

## 2018-01-18 NOTE — Progress Notes (Signed)
Initial Nutrition Assessment  DOCUMENTATION CODES:   Severe malnutrition in context of chronic illness, Underweight  INTERVENTION:   Continue Ensure Enlive po BID, each supplement provides 350 kcal and 20 grams of protein  NUTRITION DIAGNOSIS:   Severe Malnutrition related to chronic illness(dementia) as evidenced by severe fat depletion, severe muscle depletion.  GOAL:   Patient will meet greater than or equal to 90% of their needs  MONITOR:   PO intake, Labs, Weight trends, I & O's, Skin, Supplement acceptance  REASON FOR ASSESSMENT:   Malnutrition Screening Tool    ASSESSMENT:   84 y.o. female past medical history of paroxysmal atrial fibrillation, chronic systolic heart failure and chronic anemia with thrombocytopenia active myelodysplastic syndrome but the patient family has refused bone marrow biopsy referred to the ED as his hemoglobin was found to be around 6.7 at facility  Patient with dementia so unable to provide nutritional history. Pt has been on a heart healthy diet and consuming 65-90% of meals over the past 48 hours. Ensure supplements are being provided.  Will continue given stage II pressure injuries.  UBW is 120 lb per previous nutrition encounters. Pt has lost 7 lb since January (6% wt loss x 6 months, insignificant for time frame). Weight is trending downward.  Medications: Ferrous sulfate BID, MAG-OX tablet daily, Miralax packet BID Labs reviewed: Elevated K   NUTRITION - FOCUSED PHYSICAL EXAM:    Most Recent Value  Orbital Region  Moderate depletion  Upper Arm Region  Severe depletion  Thoracic and Lumbar Region  Unable to assess  Buccal Region  Moderate depletion  Temple Region  Severe depletion  Clavicle Bone Region  Severe depletion  Clavicle and Acromion Bone Region  Severe depletion  Scapular Bone Region  Unable to assess  Dorsal Hand  Moderate depletion  Patellar Region  Severe depletion  Anterior Thigh Region  Severe depletion   Posterior Calf Region  Severe depletion  Edema (RD Assessment)  None       Diet Order:   Diet Order           Diet Heart Room service appropriate? Yes; Fluid consistency: Thin; Fluid restriction: 1200 mL Fluid  Diet effective now          EDUCATION NEEDS:   Not appropriate for education at this time  Skin:  Skin Assessment: Skin Integrity Issues: Skin Integrity Issues:: Stage II Stage II: sacrum, pretibial  Last BM:  7/6  Height:   Ht Readings from Last 1 Encounters:  01/16/18 5' 7"  (1.702 m)    Weight:   Wt Readings from Last 1 Encounters:  01/18/18 107 lb 12.9 oz (48.9 kg)    Ideal Body Weight:  61.4 kg  BMI:  Body mass index is 16.88 kg/m.  Estimated Nutritional Needs:   Kcal:  5051-8335  Protein:  60-70g  Fluid:  1.7L/day   Clayton Bibles, MS, RD, LDN [place] [facility] Pager: [phone] After Hours Pager: [phone]

## 2018-01-19 DIAGNOSIS — E039 Hypothyroidism, unspecified: Secondary | ICD-10-CM

## 2018-01-19 DIAGNOSIS — R1084 Generalized abdominal pain: Secondary | ICD-10-CM

## 2018-01-19 DIAGNOSIS — I2510 Atherosclerotic heart disease of native coronary artery without angina pectoris: Secondary | ICD-10-CM

## 2018-01-19 DIAGNOSIS — N390 Urinary tract infection, site not specified: Principal | ICD-10-CM

## 2018-01-19 LAB — URINE CULTURE: SPECIAL REQUESTS: NORMAL

## 2018-01-19 LAB — BASIC METABOLIC PANEL
Anion gap: 7 (ref 5–15)
BUN: 21 mg/dL (ref 8–23)
CALCIUM: 8.4 mg/dL — AB (ref 8.9–10.3)
CO2: 29 mmol/L (ref 22–32)
CREATININE: 0.51 mg/dL (ref 0.44–1.00)
Chloride: 106 mmol/L (ref 98–111)
GFR calc Af Amer: >60 mL/min (ref >60)
GLUCOSE: 80 mg/dL (ref 70–99)
POTASSIUM: 4.3 mmol/L (ref 3.5–5.1)
SODIUM: 142 mmol/L (ref 135–145)

## 2018-01-19 MED ORDER — POLYETHYLENE GLYCOL 3350 17 G PO PACK
17.0000 g | PACK | Freq: Every day | ORAL | Status: DC
  Administered 2018-01-19: 17 g via ORAL
  Filled 2018-01-19: qty 1

## 2018-01-19 MED ORDER — GABAPENTIN 300 MG PO CAPS: 300.0000 mg | ORAL_CAPSULE | Freq: Two times a day (BID) | ORAL | Status: DC

## 2018-01-19 MED ORDER — AMOXICILLIN 500 MG PO CAPS: 500.0000 mg | ORAL_CAPSULE | Freq: Three times a day (TID) | ORAL | 0 refills | Status: AC

## 2018-01-19 MED ORDER — OXYCODONE HCL 5 MG PO CAPS: 2.5000 mg | ORAL_CAPSULE | Freq: Three times a day (TID) | ORAL | 0 refills | Status: DC | PRN

## 2018-01-19 MED ORDER — AMOXICILLIN 500 MG PO CAPS
500.0000 mg | ORAL_CAPSULE | Freq: Three times a day (TID) | ORAL | Status: DC
  Administered 2018-01-19: 500 mg via ORAL
  Filled 2018-01-19: qty 1

## 2018-01-19 MED ORDER — AMPICILLIN 500 MG PO CAPS
500.0000 mg | ORAL_CAPSULE | Freq: Three times a day (TID) | ORAL | Status: DC
  Filled 2018-01-19: qty 1

## 2018-01-19 NOTE — Discharge Summary (Signed)
Physician Discharge Summary  Jean Hodges VOH:[id number] DOB: 1934/05/26 DOA: 01/16/2018  PCP: Patient, No Pcp Per  Admit date: 01/16/2018 Discharge date: 01/19/2018  Admitted From: SNF Disposition:  SNF  Recommendations for Outpatient Follow-up:  1. Follow up with PCP in 1-2 weeks 2. Please obtain BMP/CBC in one week   Home Health:No Equipment/Devices:none  Discharge Condition:stable CODE STATUS:Full Diet recommendation: Heart Healthy   Brief/Interim Summary: 84 y.o. female past medical history of paroxysmal atrial fibrillation, chronic systolic heart failure and chronic anemia with thrombocytopenia active myelodysplastic syndrome but the patient family has refused bone marrow biopsy referred to the ED as his hemoglobin was found to be around 6.7 at facility   Discharge Diagnoses:  Active Problems:   Hypothyroidism (acquired)   Type 2 diabetes mellitus with peripheral vascular disease (HCC)   Essential hypertension   Alzheimer's dementia   Coronary artery disease involving native coronary artery of native heart without angina pectoris   Symptomatic anemia   Anemia   Severe protein-calorie malnutrition (HCC)   UTI (urinary tract infection)   Pressure injury of skin   Abdominal pain   Large stool within  Symptomatic anemia: Patient has a known history of chronic anemia and thrombocytopenia, and family has refused a bone marrow biopsy in the past. She is status post 2 units of packed red blood cells her hemoglobin came back to 9.5.  We will follow-up with PCP as an outpatient check a CBC.  Abdominal pain likely due to possible UTI: CT scan of the abdomen pelvis showed large stool burden and some inflammation of the bladder. She was given an enema started on MiraLAX. Urine culture grew E. coli sensitive to amoxicillin she will continue this for a total of 7 days.  Chronic systolic heart failure: No changes were made to her medication.  Hypothyroidism: Continue  Synthroid.  Depression: No changes made.  Paroxismal atrial fibrillation: No changes made to her medication.  Seizure disorder: Continue Keppra.  Essential hypertension: Continue current regimen.   Discharge Instructions  Discharge Instructions    Diet - low sodium heart healthy   Complete by:  As directed    Diet - low sodium heart healthy   Complete by:  As directed    Increase activity slowly   Complete by:  As directed    Increase activity slowly   Complete by:  As directed      Allergies as of 01/19/2018      Reactions   Acetaminophen Other (See Comments)   Unknown   Codeine Nausea Only   Dilaudid [hydromorphone] Nausea Only   Hydrocodone Other (See Comments)   Hallucinations   Keflex [cephalexin] Diarrhea      Medication List    TAKE these medications   amoxicillin 500 MG capsule Commonly known as:  AMOXIL Take 1 capsule (500 mg total) by mouth every 8 (eight) hours for 4 days.   BIOFREEZE 4 % Gel Generic drug:  Menthol (Topical Analgesic) Apply 1 application topically 2 (two) times daily as needed (pain).   carvedilol 12.5 MG tablet Commonly known as:  COREG Take 12.5 mg by mouth 2 (two) times daily with a meal. Hold for SBP<110 and/or pulse <60/min   DECUBI-VITE PO Take 1 capsule by mouth daily.   feeding supplement (ENSURE ENLIVE) Liqd Take 237 mLs by mouth 3 (three) times daily between meals.   ferrous sulfate 325 (65 FE) MG tablet Take 325 mg by mouth 2 (two) times daily with a meal.   furosemide 20 MG  tablet Commonly known as:  LASIX Take 1 tablet (20 mg total) by mouth daily. What changed:  how much to take   levETIRAcetam 500 MG tablet Commonly known as:  KEPPRA Take 500 mg by mouth 2 (two) times daily.   levothyroxine 100 MCG tablet Commonly known as:  SYNTHROID, LEVOTHROID Take 100 mcg by mouth daily before breakfast.   magnesium oxide 400 MG tablet Commonly known as:  MAG-OX Take 400 mg by mouth daily.   nystatin 100000  UNIT/ML suspension Commonly known as:  MYCOSTATIN Take 5 mLs (500,000 Units total) by mouth 4 (four) times daily.   ondansetron 4 MG tablet Commonly known as:  ZOFRAN Take 4 mg by mouth every 6 (six) hours as needed for nausea or vomiting.   oxycodone 5 MG capsule Commonly known as:  OXY-IR Take 1 capsule (5 mg total) by mouth 3 (three) times daily as needed. What changed:  how much to take   pantoprazole 40 MG tablet Commonly known as:  PROTONIX Take 1 tablet (40 mg total) by mouth daily.   polyethylene glycol packet Commonly known as:  MIRALAX / GLYCOLAX Take 17 g by mouth daily.   potassium chloride SA 20 MEQ tablet Commonly known as:  K-DUR,KLOR-CON Take 20 mEq by mouth daily.   sennosides-docusate sodium 8.6-50 MG tablet Commonly known as:  SENOKOT-S Take 2 tablets by mouth 2 (two) times daily.   sertraline 25 MG tablet Commonly known as:  ZOLOFT Take 75 mg by mouth daily.       Allergies  Allergen Reactions  . Acetaminophen Other (See Comments)    Unknown  . Codeine Nausea Only  . Dilaudid [Hydromorphone] Nausea Only  . Hydrocodone Other (See Comments)    Hallucinations  . Keflex [Cephalexin] Diarrhea    Consultations:  None   Procedures/Studies: Dg Abd 1 View  Result Date: 01/18/2018 CLINICAL DATA:  Large stool EXAM: ABDOMEN - 1 VIEW COMPARISON:  CT abdomen and pelvis 01/16/2018 FINDINGS: Gas and stool demonstrated throughout the colon. No small or large bowel distention. Visualization of the rectum is limited due to residual contrast material in the bladder. No radiopaque stones. Diffuse vascular calcifications. Degenerative changes in the spine and hips. Cardiac enlargement with bilateral pleural effusions. IMPRESSION: Nonobstructive bowel gas pattern with gas and stool in the colon. Bilateral pleural effusions. Electronically Signed   By: Lucienne Capers M.D.   On: 01/18/2018 06:41   Ct Head Wo Contrast  Result Date: 12/29/2017 CLINICAL DATA:   84 year old female with altered mental status. Alzheimer's dementia. Initial encounter. EXAM: CT HEAD WITHOUT CONTRAST TECHNIQUE: Contiguous axial images were obtained from the base of the skull through the vertex without intravenous contrast. COMPARISON:  11/06/2017 CT. FINDINGS: Brain: No intracranial hemorrhage or CT evidence of large acute infarct. Remote right parietal-occipital lobe infarct. Chronic microvascular changes. Moderate global atrophy. No intracranial mass lesion noted on this unenhanced exam. Vascular: Vascular calcifications. Skull: Negative Sinuses/Orbits: No acute orbital abnormality. Visualized paranasal sinuses are clear. Other: Minimal partial opacification inferior right mastoid air cells. Remainder of mastoid air cells and middle ear cavities are clear. IMPRESSION: No acute intracranial abnormality. Remote right parietal lobe infarct. Moderate chronic microvascular changes. Moderate global atrophy. Electronically Signed   By: Genia Del M.D.   On: 12/29/2017 18:46   Ct Chest W Contrast  Result Date: 01/16/2018 CLINICAL DATA:  Pleural effusion. Low hemoglobin. Pain with movement. EXAM: CT CHEST, ABDOMEN, AND PELVIS WITH CONTRAST TECHNIQUE: Multidetector CT imaging of the chest, abdomen and pelvis  was performed following the standard protocol during bolus administration of intravenous contrast. CONTRAST:  72m ISOVUE-300 IOPAMIDOL (ISOVUE-300) INJECTION 61% COMPARISON:  CT abdomen dated 11/11/2017. FINDINGS: CT CHEST FINDINGS Cardiovascular: Cardiomegaly. No pericardial effusion. No thoracic aortic aneurysm or evidence of aortic dissection. Scattered aortic atherosclerosis. Prominent main pulmonary artery indicating chronic pulmonary artery hypertension. Mediastinum/Nodes: No mass or lymphadenopathy is appreciated within the mediastinum or perihilar regions. Esophagus is unremarkable. Trachea and central bronchi are unremarkable. Lungs/Pleura: Moderate to large pleural effusions  bilaterally, RIGHT slightly greater than LEFT. Associated atelectasis bilaterally. No pneumothorax. Musculoskeletal: No acute or suspicious osseous finding. Median sternotomy wires in place. Degenerative changes noted at both shoulders, moderate to severe in degree. Mild degenerative spurring within the thoracic spine. CT ABDOMEN PELVIS FINDINGS Hepatobiliary: Liver contours are slightly nodular throughout suggesting cirrhosis. No focal liver abnormality is seen. Presumed cholecystectomy. No bile duct dilatation or bile duct stone seen. Pancreas: Unremarkable. Spleen: Normal in size without focal abnormality. Adrenals/Urinary Tract: Adrenal glands appear normal. LEFT renal cyst. No suspicious mass, stone or hydronephrosis bilaterally. Thickening of the walls of the bladder circumferentially. Stomach/Bowel: Rectal vault is moderately distended with stool. There is at least mild thickening of the walls of the rectosigmoid colon. No dilated large or small bowel loops. No additional site of bowel wall thickening or evidence of bowel wall inflammation. Appendix is not convincingly seen. Vascular/Lymphatic: Marked aortic atherosclerosis. Additional atherosclerosis at the origins of each of the aortic branch vessels. No acute appearing vascular abnormality. Reproductive: Presumed hysterectomy.  No adnexal mass seen. Other: Small amount of free fluid in the pelvis. No abscess collection identified. No free intraperitoneal air. Musculoskeletal: No acute or suspicious osseous finding. Sclerotic changes of the femoral heads bilaterally suggesting chronic AVN. Degenerative spondylosis of the lumbar spine, mild to moderate in degree. Mild ill-defined edema within the subcutaneous soft tissues of the abdomen and pelvis suggesting anasarca. IMPRESSION: 1. Moderate to large pleural effusions bilaterally, RIGHT slightly greater than LEFT, with associated atelectasis bilaterally. 2. Cardiomegaly.  No evidence of associated pulmonary  edema. 3. Thickening of the bladder walls circumferentially, suspicious for cystitis. Consider correlation with urinalysis. 4. Rectal vault is moderately distended with stool. Differential considerations include stercoral colitis, localized colitis of infectious or inflammatory nature, and reactive thickening secondary to the adjacent suspected cystitis. 5. Aortic atherosclerosis. 6. Probable anasarca. 7. Additional chronic/incidental findings detailed above. Electronically Signed   By: SFranki CabotM.D.   On: 01/16/2018 21:51   Ct Abdomen Pelvis W Contrast  Result Date: 01/16/2018 CLINICAL DATA:  Pleural effusion. Low hemoglobin. Pain with movement. EXAM: CT CHEST, ABDOMEN, AND PELVIS WITH CONTRAST TECHNIQUE: Multidetector CT imaging of the chest, abdomen and pelvis was performed following the standard protocol during bolus administration of intravenous contrast. CONTRAST:  875mISOVUE-300 IOPAMIDOL (ISOVUE-300) INJECTION 61% COMPARISON:  CT abdomen dated 11/11/2017. FINDINGS: CT CHEST FINDINGS Cardiovascular: Cardiomegaly. No pericardial effusion. No thoracic aortic aneurysm or evidence of aortic dissection. Scattered aortic atherosclerosis. Prominent main pulmonary artery indicating chronic pulmonary artery hypertension. Mediastinum/Nodes: No mass or lymphadenopathy is appreciated within the mediastinum or perihilar regions. Esophagus is unremarkable. Trachea and central bronchi are unremarkable. Lungs/Pleura: Moderate to large pleural effusions bilaterally, RIGHT slightly greater than LEFT. Associated atelectasis bilaterally. No pneumothorax. Musculoskeletal: No acute or suspicious osseous finding. Median sternotomy wires in place. Degenerative changes noted at both shoulders, moderate to severe in degree. Mild degenerative spurring within the thoracic spine. CT ABDOMEN PELVIS FINDINGS Hepatobiliary: Liver contours are slightly nodular throughout suggesting cirrhosis. No focal liver  abnormality is seen.  Presumed cholecystectomy. No bile duct dilatation or bile duct stone seen. Pancreas: Unremarkable. Spleen: Normal in size without focal abnormality. Adrenals/Urinary Tract: Adrenal glands appear normal. LEFT renal cyst. No suspicious mass, stone or hydronephrosis bilaterally. Thickening of the walls of the bladder circumferentially. Stomach/Bowel: Rectal vault is moderately distended with stool. There is at least mild thickening of the walls of the rectosigmoid colon. No dilated large or small bowel loops. No additional site of bowel wall thickening or evidence of bowel wall inflammation. Appendix is not convincingly seen. Vascular/Lymphatic: Marked aortic atherosclerosis. Additional atherosclerosis at the origins of each of the aortic branch vessels. No acute appearing vascular abnormality. Reproductive: Presumed hysterectomy.  No adnexal mass seen. Other: Small amount of free fluid in the pelvis. No abscess collection identified. No free intraperitoneal air. Musculoskeletal: No acute or suspicious osseous finding. Sclerotic changes of the femoral heads bilaterally suggesting chronic AVN. Degenerative spondylosis of the lumbar spine, mild to moderate in degree. Mild ill-defined edema within the subcutaneous soft tissues of the abdomen and pelvis suggesting anasarca. IMPRESSION: 1. Moderate to large pleural effusions bilaterally, RIGHT slightly greater than LEFT, with associated atelectasis bilaterally. 2. Cardiomegaly.  No evidence of associated pulmonary edema. 3. Thickening of the bladder walls circumferentially, suspicious for cystitis. Consider correlation with urinalysis. 4. Rectal vault is moderately distended with stool. Differential considerations include stercoral colitis, localized colitis of infectious or inflammatory nature, and reactive thickening secondary to the adjacent suspected cystitis. 5. Aortic atherosclerosis. 6. Probable anasarca. 7. Additional chronic/incidental findings detailed above.  Electronically Signed   By: Franki Cabot M.D.   On: 01/16/2018 21:51   Dg Chest Port 1 View  Result Date: 12/29/2017 CLINICAL DATA:  Altered mental status. EXAM: PORTABLE CHEST 1 VIEW COMPARISON:  Radiographs of Nov 12, 2017. FINDINGS: Stable cardiomegaly. Left-sided pacemaker is unchanged in position. No pneumothorax is noted. Stable bibasilar edema or atelectasis is noted with associated pleural effusions. Bony thorax is unremarkable. IMPRESSION: Stable bibasilar atelectasis or edema is noted with associated pleural effusions. Electronically Signed   By: Marijo Conception, M.D.   On: 12/29/2017 15:47     Subjective: No Complain  Discharge Exam: Vitals:   01/18/18 2037 01/19/18 0544  BP: (!) 128/52 (!) 132/56  Pulse: 78 78  Resp: 19 16  Temp: 98.5 F (36.9 C)   SpO2: 98% 99%   Vitals:   01/18/18 1801 01/18/18 2037 01/19/18 0507 01/19/18 0544  BP: 137/60 (!) 128/52  (!) 132/56  Pulse: 78 78  78  Resp: 18 19  16   Temp:  98.5 F (36.9 C)    TempSrc:  Oral    SpO2: 100% 98%  99%  Weight:   49.4 kg (108 lb 12.8 oz)   Height:        General: Pt is alert, awake, not in acute distress Cardiovascular: RRR, S1/S2 +, no rubs, no gallops Respiratory: CTA bilaterally, no wheezing, no rhonchi Abdominal: Soft, NT, ND, bowel sounds + Extremities: no edema, no cyanosis    The results of significant diagnostics from this hospitalization (including imaging, microbiology, ancillary and laboratory) are listed below for reference.     Microbiology: Recent Results (from the past 240 hour(s))  Urine culture     Status: Abnormal   Collection Time: 01/16/18  8:45 PM  Result Value Ref Range Status   Specimen Description   Final    URINE, CATHETERIZED Performed at [facility] [address], [place], [place] [address]  Special Requests   Final    Normal Performed at [facility], [place] [address] [place], [place] [address]    Culture >=100,000  COLONIES/mL ENTEROCOCCUS FAECALIS (A)  Final   Report Status 01/19/2018 FINAL  Final   Organism ID, Bacteria ENTEROCOCCUS FAECALIS (A)  Final      Susceptibility   Enterococcus faecalis - MIC*    AMPICILLIN <=2 SENSITIVE Sensitive     LEVOFLOXACIN 2 SENSITIVE Sensitive     NITROFURANTOIN <=16 SENSITIVE Sensitive     VANCOMYCIN 1 SENSITIVE Sensitive     * >=100,000 COLONIES/mL ENTEROCOCCUS FAECALIS     Labs: BNP (last 3 results) No results for input(s): BNP in the last 8760 hours. Basic Metabolic Panel: Recent Labs  Lab 01/16/18 1804 01/17/18 0528 01/19/18 0539  NA 141 140 142  K 5.0 5.5* 4.3  CL 107 105 106  CO2 30 28 29   GLUCOSE 133* 85 80  BUN 24* 22 21  CREATININE 0.69 0.57 0.51  CALCIUM 8.6* 8.8* 8.4*   Liver Function Tests: Recent Labs  Lab 01/16/18 1804  AST 31  ALT 35  ALKPHOS 88  BILITOT 0.2*  PROT 5.2*  ALBUMIN 2.2*   Recent Labs  Lab 01/16/18 1804  LIPASE 26   No results for input(s): AMMONIA in the last 168 hours. CBC: Recent Labs  Lab 01/16/18 1804 01/17/18 0528  WBC 5.1 7.5  NEUTROABS 3.4  --   HGB 6.9* 9.5*  HCT 21.1* 28.3*  MCV 98.6 94.6  PLT 120* 133*   Cardiac Enzymes: No results for input(s): CKTOTAL, CKMB, CKMBINDEX, TROPONINI in the last 168 hours. BNP: Invalid input(s): POCBNP CBG: No results for input(s): GLUCAP in the last 168 hours. D-Dimer No results for input(s): DDIMER in the last 72 hours. Hgb A1c No results for input(s): HGBA1C in the last 72 hours. Lipid Profile No results for input(s): CHOL, HDL, LDLCALC, TRIG, CHOLHDL, LDLDIRECT in the last 72 hours. Thyroid function studies No results for input(s): TSH, T4TOTAL, T3FREE, THYROIDAB in the last 72 hours.  Invalid input(s): FREET3 Anemia work up No results for input(s): VITAMINB12, FOLATE, FERRITIN, TIBC, IRON, RETICCTPCT in the last 72 hours. Urinalysis    Component Value Date/Time   COLORURINE AMBER (A) 01/16/2018 2045   APPEARANCEUR CLOUDY (A) 01/16/2018  2045   LABSPEC 1.013 01/16/2018 2045   PHURINE 7.0 01/16/2018 2045   GLUCOSEU NEGATIVE 01/16/2018 2045   HGBUR NEGATIVE 01/16/2018 2045   BILIRUBINUR NEGATIVE 01/16/2018 2045   KETONESUR NEGATIVE 01/16/2018 2045   PROTEINUR NEGATIVE 01/16/2018 2045   NITRITE POSITIVE (A) 01/16/2018 2045   LEUKOCYTESUR MODERATE (A) 01/16/2018 2045   Sepsis Labs Invalid input(s): PROCALCITONIN,  WBC,  LACTICIDVEN Microbiology Recent Results (from the past 240 hour(s))  Urine culture     Status: Abnormal   Collection Time: 01/16/18  8:45 PM  Result Value Ref Range Status   Specimen Description   Final    URINE, CATHETERIZED Performed at [facility], [place] [address], [place], [place] [address]    Special Requests   Final    Normal Performed at [facility], [place] [address] [place], [place] [address]    Culture >=100,000 COLONIES/mL ENTEROCOCCUS FAECALIS (A)  Final   Report Status 01/19/2018 FINAL  Final   Organism ID, Bacteria ENTEROCOCCUS FAECALIS (A)  Final      Susceptibility   Enterococcus faecalis - MIC*    AMPICILLIN <=2 SENSITIVE Sensitive     LEVOFLOXACIN 2 SENSITIVE Sensitive  NITROFURANTOIN <=16 SENSITIVE Sensitive     VANCOMYCIN 1 SENSITIVE Sensitive     * >=100,000 COLONIES/mL ENTEROCOCCUS FAECALIS     Time coordinating discharge: 35 minutes  SIGNED:   Charlynne Cousins, MD  [facility] 01/19/2018, 10:19 AM Pager   If 7PM-7AM, please contact night-coverage [email or web address] Password [email or web address]

## 2018-01-19 NOTE — NC FL2 (Signed)
[place] LEVEL OF CARE SCREENING TOOL     IDENTIFICATION  Patient Name: Jean Hodges Birthdate: 01/12/1934 Sex: female Admission Date (Current Location): 01/16/2018  [facility] and [place] Number:  Herbalist and Address:  [facility],  [place], [place]      Provider Number: [id number]  Attending Physician Name and Address:  Charlynne Cousins, MD  Relative Name and Phone Number:       Current Level of Care: Hospital Recommended Level of Care: [place] Prior Approval Number:    Date Approved/Denied:   PASRR Number:    Discharge Plan: SNF    Current Diagnoses: Patient Active Problem List   Diagnosis Date Noted  . Counseling regarding advanced care planning and goals of care 01/17/2018  . Large stool   . Abdominal pain 01/16/2018  . Acute encephalopathy 12/29/2017  . Memory loss 12/02/2017  . Bruising, spontaneous 12/01/2017  . Adult failure to thrive   . Palliative care by specialist   . Palliative care encounter   . Altered mental status   . Pressure injury of skin 11/04/2017  . Hyperkalemia 11/04/2017  . UTI (urinary tract infection) 11/03/2017  . Severe protein-calorie malnutrition ([place]) 10/10/2017  . Anemia 07/23/2017  . Symptomatic anemia 11/27/2016  . Tonic-clonic seizure disorder ([place]) 10/09/2016  . Dysphagia 10/09/2016  . Moderate protein-calorie malnutrition ([place]) 10/09/2016  . Hypothyroidism (acquired) 10/09/2016  . Type 2 diabetes mellitus with peripheral vascular disease ([place]) 10/09/2016  . PVD (peripheral vascular disease) ([place]) 10/09/2016  . Essential hypertension 10/09/2016  . Alzheimer's dementia 10/09/2016  . Chronic depression 10/09/2016  . Hyperlipidemia 10/09/2016  . Coronary artery disease involving native coronary artery of native heart without angina pectoris 10/09/2016    Orientation RESPIRATION BLADDER Height & Weight     Self, Place  Normal Incontinent Weight:  108 lb 12.8 oz (49.4 kg) Height:  5\' 7"  (170.2 cm)  BEHAVIORAL SYMPTOMS/MOOD NEUROLOGICAL BOWEL NUTRITION STATUS      Continent Diet(See dc summary)  AMBULATORY STATUS COMMUNICATION OF NEEDS Skin   Total Care Verbally PU Stage and Appropriate Care(Sacrum and Pretibial, Proximal, right)                       Personal Care Assistance Level of Assistance  Total care       Total Care Assistance: Maximum assistance   Functional Limitations Info  Sight, Hearing, Speech Sight Info: Adequate Hearing Info: Adequate Speech Info: Adequate    SPECIAL CARE FACTORS FREQUENCY                       Contractures Contractures Info: Not present    Additional Factors Info  Code Status, Allergies Code Status Info: Full Allergies Info: Acetaminophen, Codeine, Dilaudid Hydromorphone, Hydrocodone, Keflex Cephalexin           Current Medications (01/19/2018):  This is the current hospital active medication list Current Facility-Administered Medications  Medication Dose Route Frequency Provider Last Rate Last Dose  . amoxicillin (AMOXIL) capsule 500 mg  500 mg Oral Q8H Charlynne Cousins, MD      . carvedilol (COREG) tablet 12.5 mg  12.5 mg Oral BID WC Rise Patience, MD   12.5 mg at 01/19/18 0753  . enoxaparin (LOVENOX) injection 30 mg  30 mg Subcutaneous QHS Rise Patience, MD   30 mg at 01/18/18 2258  . feeding supplement (ENSURE ENLIVE) (ENSURE ENLIVE) liquid 237 mL  237 mL Oral BID BM Charlynne Cousins, MD   237 mL at 01/18/18 1442  . ferrous sulfate tablet 325 mg  325 mg Oral BID WC Rise Patience, MD   325 mg at 01/19/18 0753  . haloperidol lactate (HALDOL) injection 1 mg  1 mg Intravenous Q6H PRN Charlynne Cousins, MD      . levETIRAcetam (KEPPRA) tablet 500 mg  500 mg Oral BID Rise Patience, MD   500 mg at 01/18/18 2258  . levothyroxine (SYNTHROID, LEVOTHROID) tablet 100 mcg  100 mcg Oral QAC breakfast Rise Patience, MD   100 mcg at  01/19/18 0753  . lip balm (CARMEX) ointment   Topical PRN Charlynne Cousins, MD      . magnesium oxide (MAG-OX) tablet 400 mg  400 mg Oral Daily Rise Patience, MD   400 mg at 01/18/18 1058  . ondansetron (ZOFRAN) tablet 4 mg  4 mg Oral Q6H PRN Rise Patience, MD       Or  . ondansetron [facility]) injection 4 mg  4 mg Intravenous Q6H PRN Rise Patience, MD      . oxyCODONE (Oxy IR/ROXICODONE) immediate release tablet 2.5 mg  2.5 mg Oral TID PRN Rise Patience, MD      . pantoprazole (PROTONIX) EC tablet 40 mg  40 mg Oral Daily Rise Patience, MD   40 mg at 01/18/18 1058  . polyethylene glycol (MIRALAX / GLYCOLAX) packet 17 g  17 g Oral Daily Aileen Fass, Tammi Klippel, MD      . senna-docusate (Senokot-S) tablet 2 tablet  2 tablet Oral BID Rise Patience, MD   2 tablet at 01/18/18 2258  . sertraline (ZOLOFT) tablet 75 mg  75 mg Oral Daily Rise Patience, MD   75 mg at 01/18/18 1058     Discharge Medications: Please see discharge summary for a list of discharge medications.  Relevant Imaging Results:  Relevant Lab Results:   Additional Information ssn: [id number]  Servando Snare, LCSW

## 2018-01-19 NOTE — Progress Notes (Signed)
[name] to be D/C'd Skilled nursing facility per MD order.  Discussed prescriptions and follow up appointments with the patient. Prescriptions given to patient, medication list explained in detail. Pt verbalized understanding.  Allergies as of 01/19/2018      Reactions   Acetaminophen Other (See Comments)   Unknown   Codeine Nausea Only   Dilaudid [hydromorphone] Nausea Only   Hydrocodone Other (See Comments)   Hallucinations   Keflex [cephalexin] Diarrhea      Medication List    TAKE these medications   amoxicillin 500 MG capsule Commonly known as:  AMOXIL Take 1 capsule (500 mg total) by mouth every 8 (eight) hours for 4 days.   BIOFREEZE 4 % Gel Generic drug:  Menthol (Topical Analgesic) Apply 1 application topically 2 (two) times daily as needed (pain).   carvedilol 12.5 MG tablet Commonly known as:  COREG Take 12.5 mg by mouth 2 (two) times daily with a meal. Hold for SBP<110 and/or pulse <60/min   DECUBI-VITE PO Take 1 capsule by mouth daily.   feeding supplement (ENSURE ENLIVE) Liqd Take 237 mLs by mouth 3 (three) times daily between meals.   ferrous sulfate 325 (65 FE) MG tablet Take 325 mg by mouth 2 (two) times daily with a meal.   furosemide 20 MG tablet Commonly known as:  LASIX Take 1 tablet (20 mg total) by mouth daily. What changed:  how much to take   levETIRAcetam 500 MG tablet Commonly known as:  KEPPRA Take 500 mg by mouth 2 (two) times daily.   levothyroxine 100 MCG tablet Commonly known as:  SYNTHROID, LEVOTHROID Take 100 mcg by mouth daily before breakfast.   magnesium oxide 400 MG tablet Commonly known as:  MAG-OX Take 400 mg by mouth daily.   nystatin 100000 UNIT/ML suspension Commonly known as:  MYCOSTATIN Take 5 mLs (500,000 Units total) by mouth 4 (four) times daily.   ondansetron 4 MG tablet Commonly known as:  ZOFRAN Take 4 mg by mouth every 6 (six) hours as needed for nausea or vomiting.   oxycodone 5 MG capsule Commonly  known as:  OXY-IR Take 1 capsule (5 mg total) by mouth 3 (three) times daily as needed. What changed:  how much to take   pantoprazole 40 MG tablet Commonly known as:  PROTONIX Take 1 tablet (40 mg total) by mouth daily.   polyethylene glycol packet Commonly known as:  MIRALAX / GLYCOLAX Take 17 g by mouth daily.   potassium chloride SA 20 MEQ tablet Commonly known as:  K-DUR,KLOR-CON Take 20 mEq by mouth daily.   sennosides-docusate sodium 8.6-50 MG tablet Commonly known as:  SENOKOT-S Take 2 tablets by mouth 2 (two) times daily.   sertraline 25 MG tablet Commonly known as:  ZOLOFT Take 75 mg by mouth daily.       Vitals:   01/18/18 2037 01/19/18 0544  BP: (!) 128/52 (!) 132/56  Pulse: 78 78  Resp: 19 16  Temp: 98.5 F (36.9 C)   SpO2: 98% 99%    Skin clean, dry and intact without evidence of skin break down, no evidence of skin tears noted. IV catheter discontinued intact. Site without signs and symptoms of complications. Dressing and pressure applied. Pt denies pain at this time. No complaints noted.  An After Visit Summary was printed and given to the patient. Patient transported via [facility] S 01/19/2018 1:11 PM

## 2018-01-19 NOTE — Progress Notes (Addendum)
Patient returning to [facility].  LCSW confirmed return with facility, room 1101 B.  LCSW faxed dc docs.   LCSW notified family of return.   RN report #: [place], Shawna Clamp [place] [phone]

## 2018-01-19 NOTE — Clinical Social Work Note (Signed)
Clinical Social Work Assessment  Patient Details  Name: Jean Hodges MRN: [id number] Date of Birth: [birth year]  Date of referral:  01/19/18               Reason for consult:  Discharge Planning                Permission sought to share information with:  Family Supports, Customer service manager, Case Optician, dispensing granted to share information::  Yes, Verbal Permission Granted  Name::     Sabrine Patchen  Agency::  [facility]  Relationship::  Son  Sport and exercise psychologist Information:     Housing/Transportation Living arrangements for the past 2 months:  [place] of Information:  Adult Children Patient Interpreter Needed:  None Criminal Activity/Legal Involvement Pertinent to Current Situation/Hospitalization:  No - Comment as needed Significant Relationships:  Warehouse manager, Adult Children Lives with:  Facility Resident Do you feel safe going back to the place where you live?  Yes Need for family participation in patient care:  Yes (Comment)  Care giving concerns:  No care giving concerns at the time of assessment.    Social Worker assessment / plan:  LCSW following for facility placement.   Patient is LTC at [facility]. Patient is total care at baseline.   Family agreeable to return.   PLAN: SNF at dc  Employment status:  Retired Forensic scientist:  Medicare PT Recommendations:  Not assessed at this time Information / Referral to community resources:     Patient/Family's Response to care: Family thankful for [employer].   Patient/Family's Understanding of and Emotional Response to Diagnosis, Current Treatment, and Prognosis:  Family agreeable to return to facility.   Emotional Assessment Appearance:  Appears stated age Attitude/Demeanor/Rapport:  Unable to Assess Affect (typically observed):  Unable to Assess Orientation:  Oriented to Self Alcohol / Substance use:  Not Applicable Psych involvement (Current and /or in the community):   No (Comment)  Discharge Needs  Concerns to be addressed:  No discharge needs identified Readmission within the last 30 days:  Yes Current discharge risk:  None Barriers to Discharge:  No Barriers Identified   Servando Snare, LCSW 01/19/2018, 11:31 AM

## 2018-01-19 NOTE — Progress Notes (Signed)
Patient's chart accessed due to patient's son calling about discharge.

## 2018-01-20 LAB — TYPE AND SCREEN
ABO/RH(D): A POS
ANTIBODY SCREEN: NEGATIVE
UNIT DIVISION: 0
Unit division: 0

## 2018-01-20 LAB — BPAM RBC
BLOOD PRODUCT EXPIRATION DATE: 201908032359
Blood Product Expiration Date: 201908032359
ISSUE DATE / TIME: 201907060040
UNIT TYPE AND RH: 6200
UNIT TYPE AND RH: 6200

## 2018-02-20 ENCOUNTER — Other Ambulatory Visit: Payer: Self-pay

## 2018-02-20 ENCOUNTER — Observation Stay (HOSPITAL_COMMUNITY)
Admission: EM | Admit: 2018-02-20 | Discharge: 2018-02-21 | Disposition: A | Discharge status: Skilled Nursing Facility | Payer: Medicare Other | Attending: Internal Medicine | Admitting: Internal Medicine

## 2018-02-20 DIAGNOSIS — D649 Anemia, unspecified: Secondary | ICD-10-CM | POA: Diagnosis present

## 2018-02-20 DIAGNOSIS — Z89511 Acquired absence of right leg below knee: Secondary | ICD-10-CM | POA: Diagnosis not present

## 2018-02-20 DIAGNOSIS — Z79890 Hormone replacement therapy (postmenopausal): Secondary | ICD-10-CM | POA: Diagnosis not present

## 2018-02-20 DIAGNOSIS — E1151 Type 2 diabetes mellitus with diabetic peripheral angiopathy without gangrene: Secondary | ICD-10-CM | POA: Insufficient documentation

## 2018-02-20 DIAGNOSIS — D464 Refractory anemia, unspecified: Principal | ICD-10-CM

## 2018-02-20 DIAGNOSIS — I5022 Chronic systolic (congestive) heart failure: Secondary | ICD-10-CM

## 2018-02-20 DIAGNOSIS — Z681 Body mass index (BMI) 19 or less, adult: Secondary | ICD-10-CM | POA: Insufficient documentation

## 2018-02-20 DIAGNOSIS — Z950 Presence of cardiac pacemaker: Secondary | ICD-10-CM | POA: Insufficient documentation

## 2018-02-20 DIAGNOSIS — I10 Essential (primary) hypertension: Secondary | ICD-10-CM

## 2018-02-20 DIAGNOSIS — Z79899 Other long term (current) drug therapy: Secondary | ICD-10-CM | POA: Insufficient documentation

## 2018-02-20 DIAGNOSIS — E039 Hypothyroidism, unspecified: Secondary | ICD-10-CM | POA: Insufficient documentation

## 2018-02-20 DIAGNOSIS — I110 Hypertensive heart disease with heart failure: Secondary | ICD-10-CM | POA: Diagnosis not present

## 2018-02-20 DIAGNOSIS — G40909 Epilepsy, unspecified, not intractable, without status epilepticus: Secondary | ICD-10-CM

## 2018-02-20 DIAGNOSIS — I2510 Atherosclerotic heart disease of native coronary artery without angina pectoris: Secondary | ICD-10-CM | POA: Insufficient documentation

## 2018-02-20 DIAGNOSIS — G40409 Other generalized epilepsy and epileptic syndromes, not intractable, without status epilepticus: Secondary | ICD-10-CM | POA: Diagnosis not present

## 2018-02-20 DIAGNOSIS — D696 Thrombocytopenia, unspecified: Secondary | ICD-10-CM | POA: Diagnosis not present

## 2018-02-20 DIAGNOSIS — N390 Urinary tract infection, site not specified: Secondary | ICD-10-CM | POA: Diagnosis not present

## 2018-02-20 DIAGNOSIS — G309 Alzheimer's disease, unspecified: Secondary | ICD-10-CM | POA: Insufficient documentation

## 2018-02-20 DIAGNOSIS — L89159 Pressure ulcer of sacral region, unspecified stage: Secondary | ICD-10-CM | POA: Diagnosis present

## 2018-02-20 DIAGNOSIS — F0280 Dementia in other diseases classified elsewhere without behavioral disturbance: Secondary | ICD-10-CM | POA: Diagnosis not present

## 2018-02-20 DIAGNOSIS — E43 Unspecified severe protein-calorie malnutrition: Secondary | ICD-10-CM | POA: Diagnosis not present

## 2018-02-20 DIAGNOSIS — D539 Nutritional anemia, unspecified: Secondary | ICD-10-CM | POA: Diagnosis present

## 2018-02-20 LAB — COMPREHENSIVE METABOLIC PANEL
ALBUMIN: 2.4 g/dL — AB (ref 3.5–5.0)
ALT: 30 U/L (ref 0–44)
ANION GAP: 5 (ref 5–15)
AST: 28 U/L (ref 15–41)
Alkaline Phosphatase: 76 U/L (ref 38–126)
BILIRUBIN TOTAL: 0.5 mg/dL (ref 0.3–1.2)
BUN: 22 mg/dL (ref 8–23)
CHLORIDE: 102 mmol/L (ref 98–111)
CO2: 29 mmol/L (ref 22–32)
Calcium: 8.7 mg/dL — ABNORMAL LOW (ref 8.9–10.3)
Creatinine, Ser: 0.7 mg/dL (ref 0.44–1.00)
GFR calc Af Amer: >60 mL/min (ref >60)
GLUCOSE: 102 mg/dL — AB (ref 70–99)
POTASSIUM: 5.1 mmol/L (ref 3.5–5.1)
Sodium: 136 mmol/L (ref 135–145)
TOTAL PROTEIN: 5.4 g/dL — AB (ref 6.5–8.1)

## 2018-02-20 LAB — PREPARE RBC (CROSSMATCH)

## 2018-02-20 LAB — URINALYSIS, ROUTINE W REFLEX MICROSCOPIC
BILIRUBIN URINE: NEGATIVE
Glucose, UA: NEGATIVE mg/dL
HGB URINE DIPSTICK: NEGATIVE
KETONES UR: NEGATIVE mg/dL
NITRITE: NEGATIVE
Protein, ur: NEGATIVE mg/dL
SPECIFIC GRAVITY, URINE: 1.01 (ref 1.005–1.030)
WBC, UA: >50 WBC/hpf — ABNORMAL HIGH (ref 0–5)
pH: 6 (ref 5.0–8.0)

## 2018-02-20 LAB — CBC
HEMATOCRIT: 21.3 % — AB (ref 36.0–46.0)
HEMOGLOBIN: 7.1 g/dL — AB (ref 12.0–15.0)
MCH: 34.1 pg — ABNORMAL HIGH (ref 26.0–34.0)
MCHC: 33.3 g/dL (ref 30.0–36.0)
MCV: 102.4 fL — AB (ref 78.0–100.0)
Platelets: 124 10*3/uL — ABNORMAL LOW (ref 150–400)
RBC: 2.08 MIL/uL — ABNORMAL LOW (ref 3.87–5.11)
RDW: 20.7 % — AB (ref 11.5–15.5)
WBC: 6.3 10*3/uL (ref 4.0–10.5)

## 2018-02-20 LAB — RETICULOCYTES
RBC.: 2 MIL/uL — ABNORMAL LOW (ref 3.87–5.11)
RETIC CT PCT: 1.3 % (ref 0.4–3.1)
Retic Count, Absolute: 26 10*3/uL (ref 19.0–186.0)

## 2018-02-20 LAB — VITAMIN B12: VITAMIN B 12: 1101 pg/mL — AB (ref 180–914)

## 2018-02-20 LAB — FERRITIN: Ferritin: 1673 ng/mL — ABNORMAL HIGH (ref 11–307)

## 2018-02-20 LAB — FOLATE: Folate: 7.4 ng/mL (ref >5.9)

## 2018-02-20 LAB — POC OCCULT BLOOD, ED: Fecal Occult Bld: NEGATIVE

## 2018-02-20 MED ORDER — FUROSEMIDE 20 MG PO TABS
10.0000 mg | ORAL_TABLET | Freq: Every day | ORAL | Status: DC
  Administered 2018-02-21 (×2): 10 mg via ORAL
  Filled 2018-02-20 (×2): qty 1

## 2018-02-20 MED ORDER — AMPICILLIN-SULBACTAM SODIUM 3 (2-1) G IJ SOLR
3.0000 g | Freq: Four times a day (QID) | INTRAMUSCULAR | Status: DC
  Administered 2018-02-21 (×2): 3 g via INTRAVENOUS
  Filled 2018-02-20 (×3): qty 3

## 2018-02-20 MED ORDER — ENOXAPARIN SODIUM 40 MG/0.4ML ~~LOC~~ SOLN
40.0000 mg | Freq: Every day | SUBCUTANEOUS | Status: DC
  Administered 2018-02-21: 40 mg via SUBCUTANEOUS
  Filled 2018-02-20: qty 0.4

## 2018-02-20 MED ORDER — FERROUS SULFATE 325 (65 FE) MG PO TABS
325.0000 mg | ORAL_TABLET | Freq: Two times a day (BID) | ORAL | Status: DC
  Administered 2018-02-21: 325 mg via ORAL
  Filled 2018-02-20: qty 1

## 2018-02-20 MED ORDER — ONDANSETRON HCL 4 MG/2ML IJ SOLN: 4.0000 mg | Freq: Four times a day (QID) | INTRAMUSCULAR | Status: DC | PRN

## 2018-02-20 MED ORDER — SODIUM CHLORIDE 0.9% IV SOLUTION
Freq: Once | INTRAVENOUS | Status: AC
  Administered 2018-02-20: 21:00:00 via INTRAVENOUS

## 2018-02-20 MED ORDER — FUROSEMIDE 20 MG PO TABS: 10.0000 mg | ORAL_TABLET | Freq: Every day | ORAL | Status: DC

## 2018-02-20 MED ORDER — PANTOPRAZOLE SODIUM 40 MG PO TBEC
40.0000 mg | DELAYED_RELEASE_TABLET | Freq: Every day | ORAL | Status: DC
  Administered 2018-02-21: 40 mg via ORAL
  Filled 2018-02-20: qty 1

## 2018-02-20 MED ORDER — SENNOSIDES-DOCUSATE SODIUM 8.6-50 MG PO TABS
2.0000 | ORAL_TABLET | Freq: Two times a day (BID) | ORAL | Status: DC
  Administered 2018-02-21 (×2): 2 via ORAL
  Filled 2018-02-20 (×2): qty 2

## 2018-02-20 MED ORDER — POLYETHYLENE GLYCOL 3350 17 G PO PACK
17.0000 g | PACK | Freq: Every day | ORAL | Status: DC
  Administered 2018-02-21: 17 g via ORAL
  Filled 2018-02-20: qty 1

## 2018-02-20 MED ORDER — MAGNESIUM OXIDE 400 (241.3 MG) MG PO TABS
400.0000 mg | ORAL_TABLET | Freq: Every day | ORAL | Status: DC
  Administered 2018-02-21: 400 mg via ORAL
  Filled 2018-02-20: qty 1

## 2018-02-20 MED ORDER — ONDANSETRON HCL 4 MG PO TABS: 4.0000 mg | ORAL_TABLET | Freq: Four times a day (QID) | ORAL | Status: DC | PRN

## 2018-02-20 MED ORDER — CARVEDILOL 6.25 MG PO TABS
6.2500 mg | ORAL_TABLET | Freq: Two times a day (BID) | ORAL | Status: DC
  Administered 2018-02-21: 6.25 mg via ORAL
  Filled 2018-02-20: qty 1

## 2018-02-20 MED ORDER — SERTRALINE HCL 50 MG PO TABS
75.0000 mg | ORAL_TABLET | Freq: Every day | ORAL | Status: DC
  Administered 2018-02-21: 75 mg via ORAL
  Filled 2018-02-20: qty 2

## 2018-02-20 MED ORDER — LEVOTHYROXINE SODIUM 100 MCG PO TABS
100.0000 ug | ORAL_TABLET | Freq: Every day | ORAL | Status: DC
  Administered 2018-02-21: 100 ug via ORAL
  Filled 2018-02-20: qty 1

## 2018-02-20 MED ORDER — LEVETIRACETAM 500 MG PO TABS
500.0000 mg | ORAL_TABLET | Freq: Two times a day (BID) | ORAL | Status: DC
Start: 2018-02-20 — End: 2018-02-21
  Administered 2018-02-21 (×2): 500 mg via ORAL
  Filled 2018-02-20 (×2): qty 1

## 2018-02-20 MED ORDER — SODIUM CHLORIDE 0.9 % IV SOLN
1.0000 g | Freq: Once | INTRAVENOUS | Status: AC
  Administered 2018-02-20: 1 g via INTRAVENOUS
  Filled 2018-02-20: qty 10

## 2018-02-20 MED ORDER — ACETAMINOPHEN 325 MG PO TABS: 650.0000 mg | ORAL_TABLET | Freq: Four times a day (QID) | ORAL | Status: DC | PRN

## 2018-02-20 MED ORDER — ACETAMINOPHEN 650 MG RE SUPP: 650.0000 mg | Freq: Four times a day (QID) | RECTAL | Status: DC | PRN

## 2018-02-20 NOTE — ED Triage Notes (Signed)
Per EMS: Pt is coming from [facility]. Pt recently had her Hgb checked and it was reported at 6.5.

## 2018-02-20 NOTE — ED Provider Notes (Signed)
[facility] DEPT Provider Note   CSN: [id number] Arrival date & time: 02/20/18  1739     History   Chief Complaint Chief Complaint  Patient presents with  . Anemia    HPI Jean Hodges is a 84 y.o. female.  The history is provided by medical records, the nursing home and the EMS personnel. No language interpreter was used.  Anemia      84 year old female with history of anemia, failure to thrive, PAF, CHF, seizure, diabetes, hypertension, Alzheimer brought here via EMS due to low hemoglobin.  History is limited due to patient's baseline dementia.  Patient has known history of anemia requiring blood transfusion in the past.  Per nursing note, patient brought here from [facility] facility.  Patient was found to be anemic on a routine blood work.  Her hemoglobin is 6.5.  She brought here for further care.  Patient is full code.  Level 5 caveats due to baseline dementia.  Past Medical History:  Diagnosis Date  . Acute encephalopathy   . Acute encephalopathy 09/30/2016  . Alzheimer's dementia without behavioral disturbance   . Alzheimer's dementia without behavioral disturbance 10/07/2016  . Anemia   . Anemia 01/28/2014  . Atherosclerotic PVD with ulceration ([place])   . CAD (coronary artery disease)   . CAD (coronary artery disease) 01/04/2014  . Cellulitis   . Cellulitis of left leg   . CHF (congestive heart failure) ([place])   . Diabetes mellitus without complication ([place])   . DM (diabetes mellitus) ([place]) 01/27/2014  . Dysphagia   . HTN (hypertension) 01/04/2014  . Hyperlipidemia   . Hypertension   . Hypoglycemia associated with type 2 diabetes mellitus ([place])   . Macrocytic anemia   . Seizures ([place]) 09/30/2016  . Tear of left rotator cuff   . UTI (urinary tract infection)   . UTI (urinary tract infection) 09/25/2016    Patient Active Problem List   Diagnosis Date Noted  . Counseling regarding advanced care planning and goals of care  01/17/2018  . Large stool   . Abdominal pain 01/16/2018  . Acute encephalopathy 12/29/2017  . Memory loss 12/02/2017  . Bruising, spontaneous 12/01/2017  . Adult failure to thrive   . Palliative care by specialist   . Palliative care encounter   . Altered mental status   . Pressure injury of skin 11/04/2017  . Hyperkalemia 11/04/2017  . UTI (urinary tract infection) 11/03/2017  . Severe protein-calorie malnutrition ([place]) 10/10/2017  . Anemia 07/23/2017  . Symptomatic anemia 11/27/2016  . Tonic-clonic seizure disorder ([place]) 10/09/2016  . Dysphagia 10/09/2016  . Moderate protein-calorie malnutrition ([place]) 10/09/2016  . Hypothyroidism (acquired) 10/09/2016  . Type 2 diabetes mellitus with peripheral vascular disease ([place]) 10/09/2016  . PVD (peripheral vascular disease) ([place]) 10/09/2016  . Essential hypertension 10/09/2016  . Alzheimer's dementia 10/09/2016  . Chronic depression 10/09/2016  . Hyperlipidemia 10/09/2016  . Coronary artery disease involving native coronary artery of native heart without angina pectoris 10/09/2016    Past Surgical History:  Procedure Laterality Date  . BELOW KNEE LEG AMPUTATION Right   . PACEMAKER IMPLANT       OB History   None      Home Medications    Prior to Admission medications   Medication Sig Start Date End Date Taking? Authorizing Provider  carvedilol (COREG) 12.5 MG tablet Take 6.25 mg by mouth 2 (two) times daily with a meal. Hold for SBP<110 and/or pulse <60/min   Yes [provider]  ferrous sulfate 325 (65 FE) MG tablet Take 325 mg by mouth 2 (two) times daily with a meal.   Yes [provider]  furosemide (LASIX) 20 MG tablet Take 1 tablet (20 mg total) by mouth daily. Patient taking differently: Take 10 mg by mouth daily.  11/14/17 11/14/18 Yes Jani Gravel, MD  levETIRAcetam (KEPPRA) 500 MG tablet Take 500 mg by mouth 2 (two) times daily.   Yes [provider]  levothyroxine (SYNTHROID, LEVOTHROID) 100  MCG tablet Take 100 mcg by mouth daily before breakfast.   Yes [provider]  magnesium oxide (MAG-OX) 400 MG tablet Take 400 mg by mouth daily.   Yes [provider]  Multiple Vitamins-Minerals (DECUBI-VITE PO) Take 1 capsule by mouth daily.   Yes [provider]  ondansetron (ZOFRAN) 4 MG tablet Take 4 mg by mouth every 6 (six) hours as needed for nausea or vomiting.   Yes [provider]  pantoprazole (PROTONIX) 40 MG tablet Take 1 tablet (40 mg total) by mouth daily. 07/25/17  Yes Barton Dubois, MD  polyethylene glycol [facility] / GLYCOLAX) packet Take 17 g by mouth daily.   Yes [provider]  potassium chloride SA (K-DUR,KLOR-CON) 20 MEQ tablet Take 20 mEq by mouth daily.   Yes [provider]  sennosides-docusate sodium (SENOKOT-S) 8.6-50 MG tablet Take 2 tablets by mouth 2 (two) times daily.   Yes [provider]  sertraline (ZOLOFT) 25 MG tablet Take 75 mg by mouth daily.   Yes [provider]  feeding supplement, ENSURE ENLIVE, (ENSURE ENLIVE) LIQD Take 237 mLs by mouth 3 (three) times daily between meals. Patient not taking: Reported on 02/20/2018 10/10/17   Yaakov Guthrie, MD  Menthol, Topical Analgesic, (BIOFREEZE) 4 % GEL Apply 1 application topically 2 (two) times daily as needed (pain).    [provider]  nystatin (MYCOSTATIN) 100000 UNIT/ML suspension Take 5 mLs (500,000 Units total) by mouth 4 (four) times daily. Patient not taking: Reported on 12/12/2017 11/14/17   Jani Gravel, MD  oxycodone (OXY-IR) 5 MG capsule Take 1 capsule (5 mg total) by mouth 3 (three) times daily as needed. Patient not taking: Reported on 02/20/2018 01/19/18   Charlynne Cousins, MD    Family History Family History  Family history unknown: Yes    Social History Social History   Tobacco Use  . Smoking status: Never Smoker  . Smokeless tobacco: Never Used  Substance Use Topics  . Alcohol use: No  . Drug use: No      Allergies   Acetaminophen; Codeine; Dilaudid [hydromorphone]; Hydrocodone; and Keflex [cephalexin]   Review of Systems Review of Systems  Unable to perform ROS: Dementia     Physical Exam Updated Vital Signs BP (!) 118/50   Pulse 78   Temp 100.1 F (37.8 C) (Rectal)   SpO2 98%   Physical Exam  Constitutional:  Frail appearing elderly female in no acute discomfort.  Eyes: Pupils are equal, round, and reactive to light. EOM are normal.  Neck: Normal range of motion. Neck supple.  No nuchal rigidity  Cardiovascular: Normal rate and regular rhythm.  Pulmonary/Chest: Effort normal and breath sounds normal.  Abdominal: Soft. She exhibits no distension. There is no tenderness.  Neurological:  Patient is alert to self but unable to answer any additional questions.  Able to move all 4 extremities with poor effort.  Skin: There is pallor.  Right AKA.  sacral decubitus ulcer   Nursing note and vitals reviewed.  ED Treatments / Results  Labs (all labs ordered are listed, but only abnormal results are displayed) Labs Reviewed  COMPREHENSIVE METABOLIC PANEL - Abnormal; Notable for the following components:      Result Value   Glucose, Bld 102 (*)    Calcium 8.7 (*)    Total Protein 5.4 (*)    Albumin 2.4 (*)    All other components within normal limits  CBC - Abnormal; Notable for the following components:   RBC 2.08 (*)    Hemoglobin 7.1 (*)    HCT 21.3 (*)    MCV 102.4 (*)    MCH 34.1 (*)    RDW 20.7 (*)    Platelets 124 (*)    All other components within normal limits  URINALYSIS, ROUTINE W REFLEX MICROSCOPIC - Abnormal; Notable for the following components:   APPearance HAZY (*)    Leukocytes, UA LARGE (*)    WBC, UA >50 (*)    Bacteria, UA MANY (*)    All other components within normal limits  RETICULOCYTES - Abnormal; Notable for the following components:   RBC. 2.00 (*)    All other components within normal limits  URINE CULTURE  VITAMIN B12   FOLATE  IRON AND TIBC  FERRITIN  CBC  BASIC METABOLIC PANEL  POC OCCULT BLOOD, ED  POC OCCULT BLOOD, ED  TYPE AND SCREEN  PREPARE RBC (CROSSMATCH)    EKG None  Radiology   Procedures .Critical Care Performed by: Domenic Moras, PA-C Authorized by: Domenic Moras, PA-C   Critical care provider statement:    Critical care time (minutes):  45   Critical care was time spent personally by me on the following activities:  Discussions with consultants, evaluation of patient's response to treatment, examination of patient, ordering and performing treatments and interventions, ordering and review of laboratory studies, ordering and review of radiographic studies, pulse oximetry, re-evaluation of patient's condition, obtaining history from patient or surrogate and review of old charts   (including critical care time)  Medications Ordered in ED Medications  levETIRAcetam (KEPPRA) tablet 500 mg (has no administration in time range)  ferrous sulfate tablet 325 mg (has no administration in time range)  levothyroxine (SYNTHROID, LEVOTHROID) tablet 100 mcg (has no administration in time range)  magnesium oxide (MAG-OX) tablet 400 mg (has no administration in time range)  sertraline (ZOLOFT) tablet 75 mg (has no administration in time range)  pantoprazole (PROTONIX) EC tablet 40 mg (has no administration in time range)  polyethylene glycol (MIRALAX / GLYCOLAX) packet 17 g (has no administration in time range)  sennosides-docusate sodium (SENOKOT-S) 8.6-50 MG tablet 2 tablet (has no administration in time range)  carvedilol (COREG) tablet 6.25 mg (has no administration in time range)  furosemide (LASIX) tablet 10 mg (has no administration in time range)  0.9 %  sodium chloride infusion (Manually program via Guardrails IV Fluids) ( Intravenous New Bag/Given 02/20/18 2031)  cefTRIAXone (ROCEPHIN) 1 g in sodium chloride 0.9 % 100 mL IVPB (0 g Intravenous Stopped 02/20/18 2112)     Initial Impression  / Assessment and Plan / ED Course  I have reviewed the triage vital signs and the nursing notes.  Pertinent labs & imaging results that were available during my care of the patient were reviewed by me and considered in my medical decision making (see chart for details).  Clinical Course as of Feb 20 2113  Fri Feb 20, 2018  2000 Appearance(!): HAZY [JK]  2001 WBC: 6.3 [JK]  Clinical Course User Index [JK] Dorie Rank, MD    BP (!) 116/46   Pulse 80   Temp 100.1 F (37.8 C) (Rectal)   Resp 17   SpO2 98%    Final Clinical Impressions(s) / ED Diagnoses   Final diagnoses:  Refractory anemia due to myelodysplastic syndrome (HCC)  Urinary tract infection without hematuria, site unspecified    ED Discharge Orders    None     6:32 PM Patient with history of anemia who was found to have hemoglobin of 6.5 on routine blood work today her skilled and rehab facility, [name]  Poor historian therefore unable to obtain much history.  Vision does appear to be very pale.  No focal point tenderness on exam.  Work-up initiated.  Discussed with Dr. Tomi Bamberger  9:15 PM Patient does have a hemoglobin of 7.1, a drop from a previous value a month ago.  She also has an underlying UTI with urinalysis shows large leukocyte Estrace, greater than 50 WBC and many bacteria.  Patient will be transfused with 2 units of red blood cells, and will also give Rocephin for UTI.  Urine culture sent.  Normal WBC.  Appreciate consultation from tried hospitalist, Dr. Alcario Drought who agrees to admit patient follow-up with continuation of blood transfusion.  Patient does have underlying myelodysplastic anemia   Domenic Moras, PA-C 02/20/18 2119    Dorie Rank, MD 02/20/18 [phone]

## 2018-02-20 NOTE — ED Notes (Signed)
Bed: GI71 Expected date:  Expected time:  Means of arrival:  Comments: EMS/fever/anemia

## 2018-02-20 NOTE — Progress Notes (Signed)
Pharmacy Antibiotic Note  Annalei Friesz is a 84 y.o. female admitted on 02/20/2018 with hx of anemia,PAF, CHF,  seizure, diabetes, hypertension, Alzheimer brought here via EMS due to low hemoglobin .  Pt with hx of enterococcus UTI last month, pharmacy consulted to dose unasyn.  Plan: unasyn 3gm IV q6h Follow renal function and clinical course     Temp (24hrs), Avg:100.1 F (37.8 C), Min:100.1 F (37.8 C), Max:100.1 F (37.8 C)  Recent Labs  Lab 02/20/18 1807  WBC 6.3  CREATININE 0.70    CrCl cannot be calculated (Unknown ideal weight.).    Allergies  Allergen Reactions  . Acetaminophen Other (See Comments)    Unknown  . Codeine Nausea Only  . Dilaudid [Hydromorphone] Nausea Only  . Hydrocodone Other (See Comments)    Hallucinations  . Keflex [Cephalexin] Diarrhea    Antimicrobials this admission: 8/9 CTX x 1 8/9 Unasyn >>  Microbiology results:  8/9 UCx:     Thank you for allowing pharmacy to be a part of this patient's care.  Dolly Rias RPh 02/20/2018, 9:29 PM Pager [phone]

## 2018-02-20 NOTE — H&P (Signed)
History and Physical    Jean Hodges CBJ:[id number] DOB: 1933/10/19 DOA: 02/20/2018  PCP: Patient, No Pcp Per  Patient coming from: SNF  I have personally briefly reviewed patient's old medical records in [place]  Chief Complaint: Anemia  HPI: Jean Hodges is a 84 y.o. female with medical history significant of advanced alzheimer's dementia, severe malnutrition, chronic recurrent anemia and thrombocytopenia to a lesser degree, believed to be due to MDS though family doesn't want bone marrow biopsy for formal diagnosis.  Is treated with PRN blood transfusions only.  Patient sent in from SNF today because HGB came back at 6.5.  Patient not able to provide much more history due to advanced dementia.  Does deny pain, denies abd pain, SOB, CP.   ED Course: HGB 7.1.  UA shows UTI.  Tm 100.1.  EDP ordered 2u PRBC transfusion and reqs admit.   Review of Systems: As per HPI otherwise 10 point review of systems negative. With the note that patient has severe dementia.  Past Medical History:  Diagnosis Date  . Acute encephalopathy   . Acute encephalopathy 09/30/2016  . Alzheimer's dementia without behavioral disturbance   . Alzheimer's dementia without behavioral disturbance 10/07/2016  . Anemia   . Anemia 01/28/2014  . Atherosclerotic PVD with ulceration ([place])   . CAD (coronary artery disease)   . CAD (coronary artery disease) 01/04/2014  . Cellulitis   . Cellulitis of left leg   . CHF (congestive heart failure) ([place])   . Diabetes mellitus without complication ([place])   . DM (diabetes mellitus) ([place]) 01/27/2014  . Dysphagia   . HTN (hypertension) 01/04/2014  . Hyperlipidemia   . Hypertension   . Hypoglycemia associated with type 2 diabetes mellitus ([place])   . Macrocytic anemia   . Seizures ([place]) 09/30/2016  . Tear of left rotator cuff   . UTI (urinary tract infection)   . UTI (urinary tract infection) 09/25/2016    Past Surgical History:  Procedure Laterality Date  .  BELOW KNEE LEG AMPUTATION Right   . PACEMAKER IMPLANT       reports that she has never smoked. She has never used smokeless tobacco. She reports that she does not drink alcohol or use drugs.  Allergies  Allergen Reactions  . Acetaminophen Other (See Comments)    Unknown  . Codeine Nausea Only  . Dilaudid [Hydromorphone] Nausea Only  . Hydrocodone Other (See Comments)    Hallucinations  . Keflex [Cephalexin] Diarrhea    Family History  Family history unknown: Yes     Prior to Admission medications   Medication Sig Start Date End Date Taking? Authorizing Provider  carvedilol (COREG) 12.5 MG tablet Take 6.25 mg by mouth 2 (two) times daily with a meal. Hold for SBP<110 and/or pulse <60/min   Yes [provider]  ferrous sulfate 325 (65 FE) MG tablet Take 325 mg by mouth 2 (two) times daily with a meal.   Yes [provider]  furosemide (LASIX) 20 MG tablet Take 1 tablet (20 mg total) by mouth daily. Patient taking differently: Take 10 mg by mouth daily.  11/14/17 11/14/18 Yes Jani Gravel, MD  levETIRAcetam (KEPPRA) 500 MG tablet Take 500 mg by mouth 2 (two) times daily.   Yes [provider]  levothyroxine (SYNTHROID, LEVOTHROID) 100 MCG tablet Take 100 mcg by mouth daily before breakfast.   Yes [provider]  magnesium oxide (MAG-OX) 400 MG tablet Take 400 mg by mouth daily.   Yes [provider]  Multiple Vitamins-Minerals (DECUBI-VITE PO) Take 1 capsule by mouth daily.   Yes [provider]  ondansetron (ZOFRAN) 4 MG tablet Take 4 mg by mouth every 6 (six) hours as needed for nausea or vomiting.   Yes [provider]  pantoprazole (PROTONIX) 40 MG tablet Take 1 tablet (40 mg total) by mouth daily. 07/25/17  Yes Barton Dubois, MD  polyethylene glycol [facility] / GLYCOLAX) packet Take 17 g by mouth daily.   Yes [provider]  potassium chloride SA (K-DUR,KLOR-CON) 20 MEQ tablet Take 20 mEq by mouth daily.   Yes  [provider]  sennosides-docusate sodium (SENOKOT-S) 8.6-50 MG tablet Take 2 tablets by mouth 2 (two) times daily.   Yes [provider]  sertraline (ZOLOFT) 25 MG tablet Take 75 mg by mouth daily.   Yes [provider]  Menthol, Topical Analgesic, (BIOFREEZE) 4 % GEL Apply 1 application topically 2 (two) times daily as needed (pain).    [provider]    Physical Exam: Vitals:   02/20/18 1756 02/20/18 2032 02/20/18 2100 02/20/18 2130  BP: (!) 118/50 (!) 116/46 (!) 119/42 (!) 129/48  Pulse: 78 80 79 83  Resp:  _0 Temp: 100.1 F (37.8 C)   98.9 F (37.2 C)  TempSrc: Rectal   Oral  SpO2: 98% 98% 98% 99%    Constitutional: NAD, calm, comfortable, emaciated / malnourished Eyes: PERRL, lids and conjunctivae normal ENMT: Mucous membranes are moist. Posterior pharynx clear of any exudate or lesions.Normal dentition.  Neck: normal, supple, no masses, no thyromegaly Respiratory: clear to auscultation bilaterally, no wheezing, no crackles. Normal respiratory effort. No accessory muscle use.  Cardiovascular: Regular rate and rhythm, no murmurs / rubs / gallops. No extremity edema. 2+ pedal pulses. No carotid bruits.  Abdomen: no tenderness, no masses palpated. No hepatosplenomegaly. Bowel sounds positive.  Musculoskeletal: R AKA Skin: Sacral decubitus Neurologic: CN 2-12 grossly intact. Sensation intact, DTR normal. Strength 5/5 in all 4.  Psychiatric: Oriented to self, obeys commands   Labs on Admission: I have personally reviewed following labs and imaging studies  CBC: Recent Labs  Lab 02/20/18 1807  WBC 6.3  HGB 7.1*  HCT 21.3*  MCV 102.4*  PLT 213*   Basic Metabolic Panel: Recent Labs  Lab 02/20/18 1807  NA 136  K 5.1  CL 102  CO2 29  GLUCOSE 102*  BUN 22  CREATININE 0.70  CALCIUM 8.7*   GFR: CrCl cannot be calculated (Unknown ideal weight.). Liver Function Tests: Recent Labs  Lab 02/20/18 1807  AST 28  ALT 30    ALKPHOS 76  BILITOT 0.5  PROT 5.4*  ALBUMIN 2.4*   No results for input(s): LIPASE, AMYLASE in the last 168 hours. No results for input(s): AMMONIA in the last 168 hours. Coagulation Profile: No results for input(s): INR, PROTIME in the last 168 hours. Cardiac Enzymes: No results for input(s): CKTOTAL, CKMB, CKMBINDEX, TROPONINI in the last 168 hours. BNP (last 3 results) No results for input(s): PROBNP in the last 8760 hours. HbA1C: No results for input(s): HGBA1C in the last 72 hours. CBG: No results for input(s): GLUCAP in the last 168 hours. Lipid Profile: No results for input(s): CHOL, HDL, LDLCALC, TRIG, CHOLHDL, LDLDIRECT in the last 72 hours. Thyroid Function Tests: No results for input(s): TSH, T4TOTAL, FREET4, T3FREE, THYROIDAB in the last 72 hours. Anemia Panel: Recent Labs    02/20/18 2032  FOLATE 7.4  RETICCTPCT 1.3   Urine analysis:  Component Value Date/Time   COLORURINE YELLOW 02/20/2018 1827   APPEARANCEUR HAZY (A) 02/20/2018 1827   LABSPEC 1.010 02/20/2018 1827   PHURINE 6.0 02/20/2018 1827   GLUCOSEU NEGATIVE 02/20/2018 1827   HGBUR NEGATIVE 02/20/2018 1827   BILIRUBINUR NEGATIVE 02/20/2018 1827   KETONESUR NEGATIVE 02/20/2018 1827   PROTEINUR NEGATIVE 02/20/2018 1827   NITRITE NEGATIVE 02/20/2018 1827   LEUKOCYTESUR LARGE (A) 02/20/2018 1827    Radiological Exams on Admission: No results found.  EKG: Independently reviewed.  Assessment/Plan Principal Problem:   Anemia Active Problems:   Essential hypertension   Alzheimer's dementia   Severe protein-calorie malnutrition (HCC)   UTI (urinary tract infection)   Seizure disorder (HCC)   Chronic systolic CHF (congestive heart failure) (HCC)   Sacral decubitus ulcer    1. Anemia - 1. Chronic, recurrent 2. Hemoccult neg again 3. Suspected MDS but family doesn't want bone marrow biopsy 4. 2u PRBC transfusion 5. Repeat CBC in AM 2. Chronic systolic CHF - 1. EF 43-73% 2. Transfuse  blood as slowly as possible 3. Monitor for fluid overload 4. Lasix 41m now and continue the 171mdaily 5. May need additional lasix with transfusions but no symptoms of overload at this time 3. UTI - 1. Enterococcus last month 2. Will put on Unasyn empirically 4. Seizure disorder - Continue Keppra 5. Advanced dementia - continue home meds 6. Severe malnutrition - pal care consult 7. HTN - continue coreg 8. Sacral decubitus - wound care consult  DVT prophylaxis: Lovenox Code Status: Full code for now still - pal care consult ordered Family Communication: No family in room Disposition Plan: Back to facility after admit Consults called: Pal care put into Epic Admission status: Place in obs   GARDNER, JAHanscom AFBospitalists Pager [phone] works nights!  If 7AM-7PM, please contact the primary day team physician taking care of patient  [email or web address] Password [email or web address]8/03/2018, 9:48 PM

## 2018-02-20 NOTE — ED Notes (Signed)
ED TO INPATIENT HANDOFF REPORT  Name/Age/Gender Jean Hodges 84 y.o. female  Code Status Code Status History    Date Active Date Inactive Code Status Order ID Comments User Context   01/16/2018 2309 01/19/2018 1619 Full Code [id number]  Rise Patience, MD Inpatient   12/29/2017 2110 12/30/2017 2059 Full Code [id number]  Rise Patience, MD ED   11/03/2017 1648 11/14/2017 1942 Full Code [id number]  Kayleen Memos, DO ED   10/09/2017 2031 10/10/2017 1831 Full Code [id number]  Norval Morton, MD ED   07/23/2017 1509 07/24/2017 1809 Full Code [id number]  Phillips Grout, MD Inpatient   11/27/2016 2203 11/28/2016 2154 Full Code [id number]  Rise Patience, MD Inpatient    Advance Directive Documentation     Most Recent Value  Type of Advance Directive  Out of facility DNR (pink MOST or yellow form)  Pre-existing out of facility DNR order (yellow form or pink MOST form)  Pink MOST form placed in chart (order not valid for inpatient use)  "MOST" Form in Place?  --      Home/SNF/Other Nursing Home  Chief Complaint Low hemoglobin  Level of Care/Admitting Diagnosis ED Disposition    None      Medical History Past Medical History:  Diagnosis Date  . Acute encephalopathy   . Acute encephalopathy 09/30/2016  . Alzheimer's dementia without behavioral disturbance   . Alzheimer's dementia without behavioral disturbance 10/07/2016  . Anemia   . Anemia 01/28/2014  . Atherosclerotic PVD with ulceration ([place])   . CAD (coronary artery disease)   . CAD (coronary artery disease) 01/04/2014  . Cellulitis   . Cellulitis of left leg   . CHF (congestive heart failure) ([place])   . Diabetes mellitus without complication ([place])   . DM (diabetes mellitus) ([place]) 01/27/2014  . Dysphagia   . HTN (hypertension) 01/04/2014  . Hyperlipidemia   . Hypertension   . Hypoglycemia associated with type 2 diabetes mellitus ([place])   . Macrocytic anemia   . Seizures ([place]) 09/30/2016  . Tear of left rotator cuff    . UTI (urinary tract infection)   . UTI (urinary tract infection) 09/25/2016    Allergies Allergies  Allergen Reactions  . Acetaminophen Other (See Comments)    Unknown  . Codeine Nausea Only  . Dilaudid [Hydromorphone] Nausea Only  . Hydrocodone Other (See Comments)    Hallucinations  . Keflex [Cephalexin] Diarrhea    IV Location/Drains/Wounds Patient Lines/Drains/Airways Status   Active Line/Drains/Airways    Name:   Placement date:   Placement time:   Site:   Days:   Peripheral IV 02/20/18 Right Forearm   02/20/18    1818    Forearm   less than 1   External Urinary Catheter   12/29/17    1748    --   53   Pressure Injury 11/03/17 Stage I -  Intact skin with non-blanchable redness of a localized area usually over a bony prominence.   11/03/17    1834     109   Pressure Injury 12/29/17 Stage II -  Partial thickness loss of dermis presenting as a shallow open ulcer with a red, pink wound bed without slough.   12/29/17    2300     53   Pressure Injury 12/29/17 Stage II -  Partial thickness loss of dermis presenting as a shallow open ulcer with a red, pink wound bed without slough.   12/29/17    2300  53          Labs/Imaging Results for orders placed or performed during the hospital encounter of 02/20/18 (from the past 48 hour(s))  Comprehensive metabolic panel     Status: Abnormal   Collection Time: 02/20/18  6:07 PM  Result Value Ref Range   Sodium 136 135 - 145 mmol/L   Potassium 5.1 3.5 - 5.1 mmol/L   Chloride 102 98 - 111 mmol/L   CO2 29 22 - 32 mmol/L   Glucose, Bld 102 (H) 70 - 99 mg/dL   BUN 22 8 - 23 mg/dL   Creatinine, Ser 0.70 0.44 - 1.00 mg/dL   Calcium 8.7 (L) 8.9 - 10.3 mg/dL   Total Protein 5.4 (L) 6.5 - 8.1 g/dL   Albumin 2.4 (L) 3.5 - 5.0 g/dL   AST 28 15 - 41 U/L   ALT 30 0 - 44 U/L   Alkaline Phosphatase 76 38 - 126 U/L   Total Bilirubin 0.5 0.3 - 1.2 mg/dL   GFR calc non Af Amer >60 >60 mL/min   GFR calc Af Amer >60 >60 mL/min    Comment:  (NOTE) The eGFR has been calculated using the CKD EPI equation. This calculation has not been validated in all clinical situations. eGFR's persistently <60 mL/min signify possible Chronic Kidney Disease.    Anion gap 5 5 - 15    Comment: Performed at [facility], [place] [address], [place], [place] [address]  CBC     Status: Abnormal   Collection Time: 02/20/18  6:07 PM  Result Value Ref Range   WBC 6.3 4.0 - 10.5 K/uL   RBC 2.08 (L) 3.87 - 5.11 MIL/uL   Hemoglobin 7.1 (L) 12.0 - 15.0 g/dL   HCT 21.3 (L) 36.0 - 46.0 %   MCV 102.4 (H) 78.0 - 100.0 fL   MCH 34.1 (H) 26.0 - 34.0 pg   MCHC 33.3 30.0 - 36.0 g/dL   RDW 20.7 (H) 11.5 - 15.5 %   Platelets 124 (L) 150 - 400 K/uL    Comment: Performed at [facility], [place] [address] [place], [place] [address]  Type and screen [place]     Status: None (Preliminary result)   Collection Time: 02/20/18  6:07 PM  Result Value Ref Range   ABO/RH(D) A POS    Antibody Screen NEG    Sample Expiration      02/23/2018 Performed at [facility], [place] [address] [place],  [address]    Unit Number [id number]    Blood Component Type RED CELLS,LR    Unit division 00    Status of Unit ALLOCATED    Transfusion Status OK TO TRANSFUSE    Crossmatch Result Compatible    Unit Number [id number]    Blood Component Type RED CELLS,LR    Unit division 00    Status of Unit ALLOCATED    Transfusion Status OK TO TRANSFUSE    Crossmatch Result Compatible   Prepare RBC     Status: None   Collection Time: 02/20/18  6:07 PM  Result Value Ref Range   Order Confirmation      ORDER PROCESSED BY BLOOD BANK Performed at [facility], [place] [address] [place],  [address]   Urinalysis, Routine w reflex microscopic     Status: Abnormal   Collection Time: 02/20/18  6:27 PM  Result Value Ref Range   Color, Urine YELLOW YELLOW   APPearance  HAZY (A)  CLEAR   Specific Gravity, Urine 1.010 1.005 - 1.030   pH 6.0 5.0 - 8.0   Glucose, UA NEGATIVE NEGATIVE mg/dL   Hgb urine dipstick NEGATIVE NEGATIVE   Bilirubin Urine NEGATIVE NEGATIVE   Ketones, ur NEGATIVE NEGATIVE mg/dL   Protein, ur NEGATIVE NEGATIVE mg/dL   Nitrite NEGATIVE NEGATIVE   Leukocytes, UA LARGE (A) NEGATIVE   WBC, UA >50 (H) 0 - 5 WBC/hpf   Bacteria, UA MANY (A) NONE SEEN   Squamous Epithelial / LPF 0-5 0 - 5   WBC Clumps PRESENT     Comment: Performed at [facility], [place] [address] [place], [place] [address]  POC occult blood, ED Provider will collect     Status: None   Collection Time: 02/20/18  8:27 PM  Result Value Ref Range   Fecal Occult Bld NEGATIVE NEGATIVE  Reticulocytes     Status: Abnormal   Collection Time: 02/20/18  8:32 PM  Result Value Ref Range   Retic Ct Pct 1.3 0.4 - 3.1 %   RBC. 2.00 (L) 3.87 - 5.11 MIL/uL   Retic Count, Absolute 26.0 19.0 - 186.0 K/uL    Comment: Performed at [facility], [place] [address], [place], [place] [address]   No results found.  Pending Labs Unresulted Labs (From admission, onward)    Start     Ordered   02/20/18 2002  Vitamin B12  (Anemia Panel (PNL))  STAT,   STAT     02/20/18 2002   02/20/18 2002  Folate  (Anemia Panel (PNL))  STAT,   STAT     02/20/18 2002   02/20/18 2002  Iron and TIBC  (Anemia Panel (PNL))  STAT,   STAT     02/20/18 2002   02/20/18 2002  Ferritin  (Anemia Panel (PNL))  STAT,   STAT     02/20/18 2002   02/20/18 2001  Urine Culture  STAT,   STAT     02/20/18 2000          Vitals/Pain Today's Vitals   02/20/18 1749 02/20/18 1756 02/20/18 2032  BP:  (!) 118/50 (!) 116/46  Pulse:  78 80  Resp:   17  Temp:  100.1 F (37.8 C)   TempSrc:  Rectal   SpO2:  98% 98%  PainSc: 0-No pain      Isolation Precautions No active isolations  Medications Medications  cefTRIAXone (ROCEPHIN) 1 g in sodium chloride 0.9 % 100 mL IVPB (1 g Intravenous New  Bag/Given 02/20/18 2042)  levETIRAcetam (KEPPRA) tablet 500 mg (has no administration in time range)  ferrous sulfate tablet 325 mg (has no administration in time range)  levothyroxine (SYNTHROID, LEVOTHROID) tablet 100 mcg (has no administration in time range)  magnesium oxide (MAG-OX) tablet 400 mg (has no administration in time range)  sertraline (ZOLOFT) tablet 75 mg (has no administration in time range)  pantoprazole (PROTONIX) EC tablet 40 mg (has no administration in time range)  polyethylene glycol (MIRALAX / GLYCOLAX) packet 17 g (has no administration in time range)  sennosides-docusate sodium (SENOKOT-S) 8.6-50 MG tablet 2 tablet (has no administration in time range)  carvedilol (COREG) tablet 6.25 mg (has no administration in time range)  0.9 %  sodium chloride infusion (Manually program via Guardrails IV Fluids) ( Intravenous New Bag/Given 02/20/18 2031)    Mobility non-ambulatory

## 2018-02-21 DIAGNOSIS — F0280 Dementia in other diseases classified elsewhere without behavioral disturbance: Secondary | ICD-10-CM | POA: Diagnosis not present

## 2018-02-21 DIAGNOSIS — Z515 Encounter for palliative care: Secondary | ICD-10-CM | POA: Diagnosis not present

## 2018-02-21 DIAGNOSIS — I10 Essential (primary) hypertension: Secondary | ICD-10-CM | POA: Diagnosis not present

## 2018-02-21 DIAGNOSIS — D464 Refractory anemia, unspecified: Secondary | ICD-10-CM | POA: Diagnosis not present

## 2018-02-21 DIAGNOSIS — D649 Anemia, unspecified: Secondary | ICD-10-CM | POA: Diagnosis not present

## 2018-02-21 DIAGNOSIS — G309 Alzheimer's disease, unspecified: Secondary | ICD-10-CM

## 2018-02-21 DIAGNOSIS — I5022 Chronic systolic (congestive) heart failure: Secondary | ICD-10-CM | POA: Diagnosis not present

## 2018-02-21 LAB — BASIC METABOLIC PANEL
Anion gap: 9 (ref 5–15)
BUN: 22 mg/dL (ref 8–23)
CALCIUM: 8.7 mg/dL — AB (ref 8.9–10.3)
CO2: 26 mmol/L (ref 22–32)
CREATININE: 0.58 mg/dL (ref 0.44–1.00)
Chloride: 104 mmol/L (ref 98–111)
GFR calc Af Amer: >60 mL/min (ref >60)
GLUCOSE: 98 mg/dL (ref 70–99)
POTASSIUM: 4.7 mmol/L (ref 3.5–5.1)
SODIUM: 139 mmol/L (ref 135–145)

## 2018-02-21 LAB — IRON AND TIBC
Iron: 130 ug/dL (ref 28–170)
SATURATION RATIOS: 101 % — AB (ref 10.4–31.8)
TIBC: 129 ug/dL — AB (ref 250–450)

## 2018-02-21 LAB — CBC
HCT: 31.8 % — ABNORMAL LOW (ref 36.0–46.0)
Hemoglobin: 10.8 g/dL — ABNORMAL LOW (ref 12.0–15.0)
MCH: 31.8 pg (ref 26.0–34.0)
MCHC: 34 g/dL (ref 30.0–36.0)
MCV: 93.5 fL (ref 78.0–100.0)
PLATELETS: 112 10*3/uL — AB (ref 150–400)
RBC: 3.4 MIL/uL — ABNORMAL LOW (ref 3.87–5.11)
RDW: 21.2 % — ABNORMAL HIGH (ref 11.5–15.5)
WBC: 5.9 10*3/uL (ref 4.0–10.5)

## 2018-02-21 MED ORDER — ENSURE ENLIVE PO LIQD
237.0000 mL | Freq: Two times a day (BID) | ORAL | Status: DC
  Administered 2018-02-21: 237 mL via ORAL

## 2018-02-21 MED ORDER — AMOXICILLIN-POT CLAVULANATE 875-125 MG PO TABS: 1.0000 | ORAL_TABLET | Freq: Two times a day (BID) | ORAL | 0 refills | Status: AC

## 2018-02-21 MED ORDER — ENSURE ENLIVE PO LIQD: 1.0000 | Freq: Two times a day (BID) | ORAL | 0 refills | Status: DC

## 2018-02-21 NOTE — Clinical Social Work Note (Signed)
Clinical Social Work Assessment  Patient Details  Name: Jean Hodges MRN: [id number] Date of Birth: [birth year]  Date of referral:  02/21/18               Reason for consult:  Facility Placement, Other (Comment Required)(Patient was admitted from [facility] and will return. )                Permission sought to share information with:  Family Supports Permission granted to share information::  (patient had dementia)  Name::     Sharonann Malbrough  Relationship::  son  Contact Information:  [phone]  Housing/Transportation Living arrangements for the past 2 months:  Skilled Nursing Facility([name]) Source of Information:  Adult Children Patient Interpreter Needed:  None Criminal Activity/Legal Involvement Pertinent to Current Situation/Hospitalization:  No - Comment as needed Significant Relationships:  Adult Children Lives with:  Facility Resident Do you feel safe going back to the place where you live?  Yes Need for family participation in patient care:  Yes (Comment)  Care giving concerns:  None identified at time of assessment. Patient lives in a skilled nursing facility with 24 hour supervision.  Social Worker assessment / plan:  CSW received consult for patient as she was admitted from [facility] on 02/20/2018 and is ready for discharge today. Patient is a long term care pt and is able to return today. CSW spoke with patients's son,Richard, as patient as a history of dementia and is oriented to self only. CSW did speak with patient to introduced herself however she did not interact with CSW. Per facility, no FL2 is needed as patient's length of stay is less than 24 hours and is under observation. CSW will send discharge summary to facility.   CSW will arranged EMS transportation as well as updating RN to call report to [facility].   CSW is signing off as no further needs identified.   Employment status:  Retired Forensic scientist:  Medicare PT Recommendations:  (not  assessed during admission) Information / Referral to community resources:  [name]  Patient/Family's Response to care:  Patient's son is aware and agreeable to discharge plan.   Patient/Family's Understanding of and Emotional Response to Diagnosis, Current Treatment, and Prognosis:  Patient's son is appreciative of CSW support.   Emotional Assessment Appearance:  Appears stated age, Well-Groomed Attitude/Demeanor/Rapport:  Unable to Assess Affect (typically observed):  Flat Orientation:  Oriented to Self Alcohol / Substance use:  Not Applicable Psych involvement (Current and /or in the community):  No (Comment)  Discharge Needs  Concerns to be addressed:  Adjustment to Illness Readmission within the last 30 days:  No Current discharge risk:  Chronically ill, Cognitively Impaired Barriers to Discharge:  No Barriers Identified   Darden Dates, LCSW 02/21/2018, 11:47 AM

## 2018-02-21 NOTE — Consult Note (Signed)
Consultation Note Date: 02/21/2018   Patient Name: Jean Hodges  DOB: [birth year]  MRN: [id number]  Age / Sex: 84 y.o., female  PCP: Patient, No Pcp Per Referring Physician: Cristal Ford, DO  Reason for Consultation: Establishing goals of care  HPI/Patient Profile: 84 y.o. 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 Patient is s/p transfusion of 2 units PRBC and is pending discharge back to SNF. Palliative care was asked to help establish goals.    Clinical Assessment and Goals of Care: Patient is confused in the setting of advanced dementia. No family present. She is comfortable appearing and I note that plan is for her to discharge back to [facility] this afternoon. Patient is followed there by community palliative care.   I called and spoke with patient's son, Delfino Lovett. He describes patient having progressive decline over the past few months. She has required blood transfusions with increasing frequency and has had persistent weight loss. Her functional status is poor and she is dependent upon staff for all care. She is mostly bedbound.   I talked at length about patient's expected clinical trajectory in the setting of multiple advanced diseases (dementia, ES-CM, and transfusion dependent MDS). Son stated that his goal is for continued treatment in an attempt for patient to live as long as possible. He says that hospice had been previously discussed but that he views it as a "death sentence." We talked about end of life care and code status. He says he recognizes that resuscitative efforts might prove futile but that "the outcome would be the same either way."  He had questions about a feeding tube due to patient's weight loss. We talked about the risks vs benefits of a PEG and that  artificial nutrition would be unlikely to stop her clinical decline.   SUMMARY OF RECOMMENDATIONS   1. Full code 2. Recommend continued follow up by palliative care at SNF     Primary Diagnoses: Present on Admission: . Alzheimer's dementia . Essential hypertension . UTI (urinary tract infection) . Anemia . Severe protein-calorie malnutrition ([place]) . Chronic systolic CHF (congestive heart failure) ([place]) . Sacral decubitus ulcer   I have reviewed the medical record, interviewed the patient and family, and examined the patient. The following aspects are pertinent.  Past Medical History:  Diagnosis Date  . Acute encephalopathy   . Acute encephalopathy 09/30/2016  . Alzheimer's dementia without behavioral disturbance   . Alzheimer's dementia without behavioral disturbance 10/07/2016  . Anemia   . Anemia 01/28/2014  . Atherosclerotic PVD with ulceration ([place])   . CAD (coronary artery disease)   . CAD (coronary artery disease) 01/04/2014  . Cellulitis   . Cellulitis of left leg   . CHF (congestive heart failure) ([place])   . Diabetes mellitus without complication ([place])   . DM (diabetes mellitus) ([place]) 01/27/2014  . Dysphagia   . HTN (hypertension) 01/04/2014  . Hyperlipidemia   .  Hypertension   . Hypoglycemia associated with type 2 diabetes mellitus ([place])   . Macrocytic anemia   . Seizures ([place]) 09/30/2016  . Tear of left rotator cuff   . UTI (urinary tract infection)   . UTI (urinary tract infection) 09/25/2016   Social History   Socioeconomic History  . Marital status: Widowed    Spouse name: Not on file  . Number of children: Not on file  . Years of education: Not on file  . Highest education level: Not on file  Occupational History  . Not on file  Social Needs  . Financial resource strain: Not on file  . Food insecurity:    Worry: Not on file    Inability: Not on file  . Transportation needs:    Medical: Not on file    Non-medical: Not on file  Tobacco Use    . Smoking status: Never Smoker  . Smokeless tobacco: Never Used  Substance and Sexual Activity  . Alcohol use: No  . Drug use: No  . Sexual activity: Not on file  Lifestyle  . Physical activity:    Days per week: Not on file    Minutes per session: Not on file  . Stress: Not on file  Relationships  . Social connections:    Talks on phone: Not on file    Gets together: Not on file    Attends religious service: Not on file    Active member of club or organization: Not on file    Attends meetings of clubs or organizations: Not on file    Relationship status: Not on file  Other Topics Concern  . Not on file  Social History Narrative   Lives at [facility] and [place] [address]   Phone: [phone]   Caffeine use:    ** Merged History Encounter **       Family History  Family history unknown: Yes   Scheduled Meds: . carvedilol  6.25 mg Oral BID WC  . enoxaparin (LOVENOX) injection  40 mg Subcutaneous QHS  . feeding supplement (ENSURE ENLIVE)  237 mL Oral BID BM  . ferrous sulfate  325 mg Oral BID WC  . furosemide  10 mg Oral Daily  . levETIRAcetam  500 mg Oral BID  . levothyroxine  100 mcg Oral QAC breakfast  . magnesium oxide  400 mg Oral Daily  . pantoprazole  40 mg Oral Daily  . polyethylene glycol  17 g Oral Daily  . senna-docusate  2 tablet Oral BID  . sertraline  75 mg Oral Daily   Continuous Infusions: . ampicillin-sulbactam (UNASYN) IV 3 g (02/21/18 0757)   PRN Meds:.ondansetron **OR** ondansetron (ZOFRAN) IV Medications Prior to Admission:  Prior to Admission medications   Medication Sig Start Date End Date Taking? Authorizing Provider  carvedilol (COREG) 12.5 MG tablet Take 6.25 mg by mouth 2 (two) times daily with a meal. Hold for SBP<110 and/or pulse <60/min   Yes [provider]  ferrous sulfate 325 (65 FE) MG tablet Take 325 mg by mouth 2 (two) times daily with a meal.   Yes [provider]  furosemide (LASIX) 20 MG tablet Take 1  tablet (20 mg total) by mouth daily. Patient taking differently: Take 10 mg by mouth daily.  11/14/17 11/14/18 Yes Jani Gravel, MD  levETIRAcetam (KEPPRA) 500 MG tablet Take 500 mg by mouth 2 (two) times daily.   Yes [provider]  levothyroxine (SYNTHROID, LEVOTHROID) 100 MCG tablet Take  100 mcg by mouth daily before breakfast.   Yes [provider]  magnesium oxide (MAG-OX) 400 MG tablet Take 400 mg by mouth daily.   Yes [provider]  Multiple Vitamins-Minerals (DECUBI-VITE PO) Take 1 capsule by mouth daily.   Yes [provider]  ondansetron (ZOFRAN) 4 MG tablet Take 4 mg by mouth every 6 (six) hours as needed for nausea or vomiting.   Yes [provider]  pantoprazole (PROTONIX) 40 MG tablet Take 1 tablet (40 mg total) by mouth daily. 07/25/17  Yes Barton Dubois, MD  polyethylene glycol [facility] / GLYCOLAX) packet Take 17 g by mouth daily.   Yes [provider]  potassium chloride SA (K-DUR,KLOR-CON) 20 MEQ tablet Take 20 mEq by mouth daily.   Yes [provider]  sennosides-docusate sodium (SENOKOT-S) 8.6-50 MG tablet Take 2 tablets by mouth 2 (two) times daily.   Yes [provider]  sertraline (ZOLOFT) 25 MG tablet Take 75 mg by mouth daily.   Yes [provider]  amoxicillin-clavulanate (AUGMENTIN) 875-125 MG tablet Take 1 tablet by mouth 2 (two) times daily for 7 days. 02/21/18 02/28/18  Cristal Ford, DO  feeding supplement, ENSURE ENLIVE, (ENSURE ENLIVE) LIQD Take 237 mLs by mouth 2 (two) times daily between meals. 02/21/18   Mikhail, Velta Addison, DO  Menthol, Topical Analgesic, (BIOFREEZE) 4 % GEL Apply 1 application topically 2 (two) times daily as needed (pain).    [provider]   Allergies  Allergen Reactions  . Acetaminophen Other (See Comments)    Unknown  . Codeine Nausea Only  . Dilaudid [Hydromorphone] Nausea Only  . Hydrocodone Other (See Comments)    Hallucinations  . Keflex  [Cephalexin] Diarrhea   Review of Systems  Unable to perform ROS   Physical Exam  Constitutional:  Thin, frail appearing  Cardiovascular: Normal rate and regular rhythm.  Pulmonary/Chest: Effort normal and breath sounds normal.  Abdominal: Soft. Bowel sounds are normal.  Musculoskeletal:  R. LE amputation  Neurological: She is alert.  Says a word or two, confused  Skin: Skin is warm and dry.    Vital Signs: BP 131/61   Pulse 80   Temp 98.4 F (36.9 C) (Oral)   Resp 18   Wt 47.7 kg   SpO2 100%   BMI 16.47 kg/m  Pain Scale: PAINAD   Pain Score: 0-No pain   SpO2: SpO2: 100 % O2 Device:SpO2: 100 % O2 Flow Rate: .   IO: Intake/output summary:   Intake/Output Summary (Last 24 hours) at 02/21/2018 1246 Last data filed at 02/21/2018 4580 Gross per 24 hour  Intake 1495.18 ml  Output -  Net 1495.18 ml    LBM: Last BM Date: (UTA) Baseline Weight: Weight: 47.7 kg Most recent weight: Weight: 47.7 kg     Palliative Assessment/Data:     Time In: 1100 Time Out: 1200 Time Total: 60 minutes Greater than 50%  of this time was spent counseling and coordinating care related to the above assessment and plan.  Signed by: Irean Hong, NP   Please contact Palliative Medicine Team phone at [phone] for questions and concerns.  For individual provider: See Shea Evans

## 2018-02-21 NOTE — Discharge Summary (Signed)
Physician Discharge Summary  Jean Hodges OMV:[id number] DOB: 01/27/1934 DOA: 02/20/2018  PCP: Patient, No Pcp Per  Admit date: 02/20/2018 Discharge date: 02/21/2018  Time spent: 45 minutes  Recommendations for Outpatient Follow-up:  Patient will be discharged to skilled nursing facility.  Patient will need to follow up with primary care provider within one week of discharge, repeat CBC and discuss appetite stimulants. Patient should continue medications as prescribed.  Patient should follow a heart healthy diet.   Discharge Diagnoses:  Chronic, recurrent anemia Chronic thrombocytopenia Chronic systolic congestive heart failure Urinary tract infection Seizure disorder Advanced dementia Severe malnutrition Essential hypertension Sacral decubitus ulcers  Discharge Condition: Stable  Diet recommendation: heart healthy  Filed Weights   02/21/18 1007  Weight: 47.7 kg    History of present illness:  On 02/20/2018 by Dr. Jennette Kettle Jean Hodges is a 84 y.o. female with medical history significant of advanced alzheimer's dementia, severe malnutrition, chronic recurrent anemia and thrombocytopenia to a lesser degree, believed to be due to MDS though family doesn't want bone marrow biopsy for formal diagnosis.  Is treated with PRN blood transfusions only.  Patient sent in from SNF today because HGB came back at 6.5.  Patient not able to provide much more history due to advanced dementia.  Does deny pain, denies abd pain, SOB, CP.  Hospital Course:  Chronic, recurrent anemia -FOBT negative -Suspected to be MDS however family does not want pulmonary biopsy -Hemoglobin on admission 7.1 -Patient transfused 2 units PRBC, currently 10.8 -Repeat CBC in 1 week  Chronic thrombocytopenia -Stable  Chronic systolic congestive heart failure -Echocardiogram on 11/07/2017 showed EF of 20 to 25% -Patient currently appears to be euvolemic and compensated -Continue Coreg, Lasix  Urinary tract  infection -Patient with dementia, cannot assess with her patient is truly symptomatic at this time -Urine culture in 01/16/2018 positive for enterococcus faecalis -She initially placed on Unasyn, will discharge with Augmentin -Urine culture will need to be followed by PCP  Seizure disorder -Stable, continue Keppra  Advanced dementia -stable   Severe malnutrition -Continue Ensure -discussed with son, recommended patient follow up with PCP regarding possibly increasing Ensure or may be using remeron.  Essential hypertension -BP stable, Continue Coreg  Sacral decubitus ulcers -Present on admission, continue wound care  Procedures: None  Consultations: None  Discharge Exam: Vitals:   02/21/18 0257 02/21/18 0612  BP: (!) 121/54 131/61  Pulse: 79 80  Resp: 18 18  Temp: 98.5 F (36.9 C) 98.4 F (36.9 C)  SpO2: 98% 100%     General: Well developed, thin, elderly, no apparent distress  HEENT: NCAT, mucous membranes moist.  Neck: Supple  Cardiovascular: S1 S2 auscultated, RRR,   Respiratory: Clear to auscultation bilaterally with equal chest rise  Abdomen: Soft, nontender, nondistended, + bowel sounds  Extremities: R AKA  Neuro: AAOx1 (self), nonfocal  Psych: Appropriate, pleasantly confused  Discharge Instructions Discharge Instructions    Discharge instructions   Complete by:  As directed    Patient will be discharged to skilled nursing facility.  Patient will need to follow up with primary care provider within one week of discharge, repeat CBC and discuss appetite stimulants. Patient should continue medications as prescribed.  Patient should follow a heart healthy diet.     Allergies as of 02/21/2018      Reactions   Acetaminophen Other (See Comments)   Unknown   Codeine Nausea Only   Dilaudid [hydromorphone] Nausea Only   Hydrocodone Other (See Comments)   Hallucinations  Keflex [cephalexin] Diarrhea      Medication List    TAKE these medications    amoxicillin-clavulanate 875-125 MG tablet Commonly known as:  AUGMENTIN Take 1 tablet by mouth 2 (two) times daily for 7 days.   BIOFREEZE 4 % Gel Generic drug:  Menthol (Topical Analgesic) Apply 1 application topically 2 (two) times daily as needed (pain).   carvedilol 12.5 MG tablet Commonly known as:  COREG Take 6.25 mg by mouth 2 (two) times daily with a meal. Hold for SBP<110 and/or pulse <60/min   DECUBI-VITE PO Take 1 capsule by mouth daily.   feeding supplement (ENSURE ENLIVE) Liqd Take 237 mLs by mouth 2 (two) times daily between meals.   ferrous sulfate 325 (65 FE) MG tablet Take 325 mg by mouth 2 (two) times daily with a meal.   furosemide 20 MG tablet Commonly known as:  LASIX Take 1 tablet (20 mg total) by mouth daily. What changed:  how much to take   levETIRAcetam 500 MG tablet Commonly known as:  KEPPRA Take 500 mg by mouth 2 (two) times daily.   levothyroxine 100 MCG tablet Commonly known as:  SYNTHROID, LEVOTHROID Take 100 mcg by mouth daily before breakfast.   magnesium oxide 400 MG tablet Commonly known as:  MAG-OX Take 400 mg by mouth daily.   ondansetron 4 MG tablet Commonly known as:  ZOFRAN Take 4 mg by mouth every 6 (six) hours as needed for nausea or vomiting.   pantoprazole 40 MG tablet Commonly known as:  PROTONIX Take 1 tablet (40 mg total) by mouth daily.   polyethylene glycol packet Commonly known as:  MIRALAX / GLYCOLAX Take 17 g by mouth daily.   potassium chloride SA 20 MEQ tablet Commonly known as:  K-DUR,KLOR-CON Take 20 mEq by mouth daily.   sennosides-docusate sodium 8.6-50 MG tablet Commonly known as:  SENOKOT-S Take 2 tablets by mouth 2 (two) times daily.   sertraline 25 MG tablet Commonly known as:  ZOLOFT Take 75 mg by mouth daily.      Allergies  Allergen Reactions  . Acetaminophen Other (See Comments)    Unknown  . Codeine Nausea Only  . Dilaudid [Hydromorphone] Nausea Only  . Hydrocodone Other (See  Comments)    Hallucinations  . Keflex [Cephalexin] Diarrhea      The results of significant diagnostics from this hospitalization (including imaging, microbiology, ancillary and laboratory) are listed below for reference.    Significant Diagnostic Studies: No results found.  Microbiology: No results found for this or any previous visit (from the past 240 hour(s)).   Labs: Basic Metabolic Panel: Recent Labs  Lab 02/20/18 1807 02/21/18 0833  NA 136 139  K 5.1 4.7  CL 102 104  CO2 29 26  GLUCOSE 102* 98  BUN 22 22  CREATININE 0.70 0.58  CALCIUM 8.7* 8.7*   Liver Function Tests: Recent Labs  Lab 02/20/18 1807  AST 28  ALT 30  ALKPHOS 76  BILITOT 0.5  PROT 5.4*  ALBUMIN 2.4*   No results for input(s): LIPASE, AMYLASE in the last 168 hours. No results for input(s): AMMONIA in the last 168 hours. CBC: Recent Labs  Lab 02/20/18 1807 02/21/18 0833  WBC 6.3 5.9  HGB 7.1* 10.8*  HCT 21.3* 31.8*  MCV 102.4* 93.5  PLT 124* 112*   Cardiac Enzymes: No results for input(s): CKTOTAL, CKMB, CKMBINDEX, TROPONINI in the last 168 hours. BNP: BNP (last 3 results) No results for input(s): BNP in the last 8760  hours.  ProBNP (last 3 results) No results for input(s): PROBNP in the last 8760 hours.  CBG: No results for input(s): GLUCAP in the last 168 hours.     Signed:  Julaine Zimny  [facility] 02/21/2018, 11:13 AM

## 2018-02-21 NOTE — Progress Notes (Signed)
Attempted to call report to [name]. No answer. °

## 2018-02-21 NOTE — Progress Notes (Signed)
Report called to [facility].  All questions answered.

## 2018-02-21 NOTE — Progress Notes (Signed)
Attempted to call report to [facility] twice. No answer.

## 2018-02-22 LAB — TYPE AND SCREEN
ABO/RH(D): A POS
Antibody Screen: NEGATIVE
Unit division: 0
Unit division: 0

## 2018-02-22 LAB — BPAM RBC
BLOOD PRODUCT EXPIRATION DATE: 201908312359
Blood Product Expiration Date: 201908312359
ISSUE DATE / TIME: 201908092115
ISSUE DATE / TIME: 201908100233
UNIT TYPE AND RH: 6200
Unit Type and Rh: 6200

## 2018-02-23 LAB — URINE CULTURE

## 2018-03-04 ENCOUNTER — Non-Acute Institutional Stay: Payer: Medicare Other | Admitting: Internal Medicine

## 2018-04-03 ENCOUNTER — Observation Stay (HOSPITAL_COMMUNITY)
Admission: EM | Admit: 2018-04-03 | Discharge: 2018-04-06 | Disposition: A | Discharge status: Skilled Nursing Facility | Payer: Medicare Other | Attending: Family Medicine | Admitting: Family Medicine

## 2018-04-03 ENCOUNTER — Emergency Department (HOSPITAL_COMMUNITY): Payer: Medicare Other

## 2018-04-03 ENCOUNTER — Encounter (HOSPITAL_COMMUNITY): Payer: Self-pay | Admitting: Emergency Medicine

## 2018-04-03 DIAGNOSIS — Z89511 Acquired absence of right leg below knee: Secondary | ICD-10-CM | POA: Insufficient documentation

## 2018-04-03 DIAGNOSIS — G309 Alzheimer's disease, unspecified: Secondary | ICD-10-CM | POA: Diagnosis not present

## 2018-04-03 DIAGNOSIS — Z515 Encounter for palliative care: Secondary | ICD-10-CM | POA: Diagnosis not present

## 2018-04-03 DIAGNOSIS — Z950 Presence of cardiac pacemaker: Secondary | ICD-10-CM | POA: Insufficient documentation

## 2018-04-03 DIAGNOSIS — D539 Nutritional anemia, unspecified: Secondary | ICD-10-CM | POA: Diagnosis not present

## 2018-04-03 DIAGNOSIS — I6782 Cerebral ischemia: Secondary | ICD-10-CM | POA: Diagnosis not present

## 2018-04-03 DIAGNOSIS — I739 Peripheral vascular disease, unspecified: Secondary | ICD-10-CM | POA: Diagnosis present

## 2018-04-03 DIAGNOSIS — Z7401 Bed confinement status: Secondary | ICD-10-CM | POA: Insufficient documentation

## 2018-04-03 DIAGNOSIS — Z79890 Hormone replacement therapy (postmenopausal): Secondary | ICD-10-CM | POA: Insufficient documentation

## 2018-04-03 DIAGNOSIS — Z681 Body mass index (BMI) 19 or less, adult: Secondary | ICD-10-CM | POA: Insufficient documentation

## 2018-04-03 DIAGNOSIS — I480 Paroxysmal atrial fibrillation: Secondary | ICD-10-CM | POA: Diagnosis not present

## 2018-04-03 DIAGNOSIS — E785 Hyperlipidemia, unspecified: Secondary | ICD-10-CM | POA: Insufficient documentation

## 2018-04-03 DIAGNOSIS — E43 Unspecified severe protein-calorie malnutrition: Secondary | ICD-10-CM | POA: Insufficient documentation

## 2018-04-03 DIAGNOSIS — R64 Cachexia: Secondary | ICD-10-CM | POA: Insufficient documentation

## 2018-04-03 DIAGNOSIS — E440 Moderate protein-calorie malnutrition: Secondary | ICD-10-CM | POA: Diagnosis present

## 2018-04-03 DIAGNOSIS — Z79899 Other long term (current) drug therapy: Secondary | ICD-10-CM | POA: Insufficient documentation

## 2018-04-03 DIAGNOSIS — D649 Anemia, unspecified: Secondary | ICD-10-CM | POA: Diagnosis present

## 2018-04-03 DIAGNOSIS — Z881 Allergy status to other antibiotic agents status: Secondary | ICD-10-CM | POA: Diagnosis not present

## 2018-04-03 DIAGNOSIS — F0280 Dementia in other diseases classified elsewhere without behavioral disturbance: Secondary | ICD-10-CM | POA: Insufficient documentation

## 2018-04-03 DIAGNOSIS — G319 Degenerative disease of nervous system, unspecified: Secondary | ICD-10-CM | POA: Insufficient documentation

## 2018-04-03 DIAGNOSIS — I5022 Chronic systolic (congestive) heart failure: Secondary | ICD-10-CM | POA: Insufficient documentation

## 2018-04-03 DIAGNOSIS — G40409 Other generalized epilepsy and epileptic syndromes, not intractable, without status epilepticus: Secondary | ICD-10-CM | POA: Diagnosis not present

## 2018-04-03 DIAGNOSIS — E039 Hypothyroidism, unspecified: Secondary | ICD-10-CM | POA: Insufficient documentation

## 2018-04-03 DIAGNOSIS — E1151 Type 2 diabetes mellitus with diabetic peripheral angiopathy without gangrene: Secondary | ICD-10-CM | POA: Insufficient documentation

## 2018-04-03 DIAGNOSIS — E11621 Type 2 diabetes mellitus with foot ulcer: Secondary | ICD-10-CM | POA: Insufficient documentation

## 2018-04-03 DIAGNOSIS — L89152 Pressure ulcer of sacral region, stage 2: Secondary | ICD-10-CM | POA: Diagnosis not present

## 2018-04-03 DIAGNOSIS — R627 Adult failure to thrive: Secondary | ICD-10-CM | POA: Diagnosis not present

## 2018-04-03 DIAGNOSIS — Z885 Allergy status to narcotic agent status: Secondary | ICD-10-CM | POA: Diagnosis not present

## 2018-04-03 DIAGNOSIS — J90 Pleural effusion, not elsewhere classified: Secondary | ICD-10-CM | POA: Diagnosis not present

## 2018-04-03 DIAGNOSIS — Z87440 Personal history of urinary (tract) infections: Secondary | ICD-10-CM | POA: Insufficient documentation

## 2018-04-03 DIAGNOSIS — I10 Essential (primary) hypertension: Secondary | ICD-10-CM | POA: Diagnosis present

## 2018-04-03 DIAGNOSIS — L8990 Pressure ulcer of unspecified site, unspecified stage: Secondary | ICD-10-CM | POA: Diagnosis present

## 2018-04-03 DIAGNOSIS — Z886 Allergy status to analgesic agent status: Secondary | ICD-10-CM | POA: Insufficient documentation

## 2018-04-03 DIAGNOSIS — R0789 Other chest pain: Secondary | ICD-10-CM

## 2018-04-03 DIAGNOSIS — I2510 Atherosclerotic heart disease of native coronary artery without angina pectoris: Secondary | ICD-10-CM | POA: Diagnosis not present

## 2018-04-03 DIAGNOSIS — I110 Hypertensive heart disease with heart failure: Secondary | ICD-10-CM | POA: Diagnosis not present

## 2018-04-03 LAB — CBC
HCT: 21.9 % — ABNORMAL LOW (ref 36.0–46.0)
Hemoglobin: 7.1 g/dL — ABNORMAL LOW (ref 12.0–15.0)
MCH: 33.2 pg (ref 26.0–34.0)
MCHC: 32.4 g/dL (ref 30.0–36.0)
MCV: 102.3 fL — AB (ref 78.0–100.0)
PLATELETS: 150 10*3/uL (ref 150–400)
RBC: 2.14 MIL/uL — ABNORMAL LOW (ref 3.87–5.11)
RDW: 18.6 % — AB (ref 11.5–15.5)
WBC: 7.6 10*3/uL (ref 4.0–10.5)

## 2018-04-03 NOTE — ED Notes (Signed)
Recollect dark green stop for [employer]

## 2018-04-03 NOTE — ED Notes (Signed)
Pt given 324mg  aspirin PTA.

## 2018-04-03 NOTE — ED Notes (Signed)
Patient transported to X-ray 

## 2018-04-03 NOTE — ED Triage Notes (Signed)
Pt BIB [facility] from [facility], c/o chest pain and buttock pain. Pt pale, hx anemia. Staff reports lab work done yesterday, hgb at that time 7.4. Pt pale, hx transfusions.

## 2018-04-04 ENCOUNTER — Other Ambulatory Visit: Payer: Self-pay

## 2018-04-04 ENCOUNTER — Encounter (HOSPITAL_COMMUNITY): Payer: Self-pay | Admitting: Emergency Medicine

## 2018-04-04 DIAGNOSIS — E11621 Type 2 diabetes mellitus with foot ulcer: Secondary | ICD-10-CM | POA: Diagnosis not present

## 2018-04-04 DIAGNOSIS — E039 Hypothyroidism, unspecified: Secondary | ICD-10-CM

## 2018-04-04 DIAGNOSIS — D649 Anemia, unspecified: Secondary | ICD-10-CM | POA: Diagnosis not present

## 2018-04-04 DIAGNOSIS — G309 Alzheimer's disease, unspecified: Secondary | ICD-10-CM

## 2018-04-04 DIAGNOSIS — Z515 Encounter for palliative care: Secondary | ICD-10-CM | POA: Diagnosis not present

## 2018-04-04 DIAGNOSIS — D539 Nutritional anemia, unspecified: Secondary | ICD-10-CM

## 2018-04-04 DIAGNOSIS — F0280 Dementia in other diseases classified elsewhere without behavioral disturbance: Secondary | ICD-10-CM | POA: Diagnosis not present

## 2018-04-04 DIAGNOSIS — E1151 Type 2 diabetes mellitus with diabetic peripheral angiopathy without gangrene: Secondary | ICD-10-CM | POA: Diagnosis not present

## 2018-04-04 DIAGNOSIS — I10 Essential (primary) hypertension: Secondary | ICD-10-CM

## 2018-04-04 DIAGNOSIS — Z89511 Acquired absence of right leg below knee: Secondary | ICD-10-CM

## 2018-04-04 DIAGNOSIS — E440 Moderate protein-calorie malnutrition: Secondary | ICD-10-CM

## 2018-04-04 LAB — I-STAT CHEM 8, ED
BUN: 15 mg/dL (ref 8–23)
CHLORIDE: 96 mmol/L — AB (ref 98–111)
Calcium, Ion: 1.18 mmol/L (ref 1.15–1.40)
Creatinine, Ser: 0.4 mg/dL — ABNORMAL LOW (ref 0.44–1.00)
Glucose, Bld: 108 mg/dL — ABNORMAL HIGH (ref 70–99)
HEMATOCRIT: 20 % — AB (ref 36.0–46.0)
HEMOGLOBIN: 6.8 g/dL — AB (ref 12.0–15.0)
POTASSIUM: 4.6 mmol/L (ref 3.5–5.1)
SODIUM: 132 mmol/L — AB (ref 135–145)
TCO2: 27 mmol/L (ref 22–32)

## 2018-04-04 LAB — CREATININE, SERUM
Creatinine, Ser: 0.47 mg/dL (ref 0.44–1.00)
GFR calc non Af Amer: >60 mL/min (ref >60)

## 2018-04-04 LAB — GLUCOSE, CAPILLARY
GLUCOSE-CAPILLARY: 94 mg/dL (ref 70–99)
GLUCOSE-CAPILLARY: 131 mg/dL — AB (ref 70–99)
GLUCOSE-CAPILLARY: 174 mg/dL — AB (ref 70–99)
Glucose-Capillary: 100 mg/dL — ABNORMAL HIGH (ref 70–99)

## 2018-04-04 LAB — POC OCCULT BLOOD, ED: FECAL OCCULT BLD: NEGATIVE

## 2018-04-04 LAB — CBC
HCT: 33 % — ABNORMAL LOW (ref 36.0–46.0)
HEMOGLOBIN: 11.1 g/dL — AB (ref 12.0–15.0)
MCH: 32.1 pg (ref 26.0–34.0)
MCHC: 33.6 g/dL (ref 30.0–36.0)
MCV: 95.4 fL (ref 78.0–100.0)
PLATELETS: 109 10*3/uL — AB (ref 150–400)
RBC: 3.46 MIL/uL — AB (ref 3.87–5.11)
RDW: 17.4 % — ABNORMAL HIGH (ref 11.5–15.5)
WBC: 6.7 10*3/uL (ref 4.0–10.5)

## 2018-04-04 LAB — MRSA PCR SCREENING: MRSA by PCR: POSITIVE — AB

## 2018-04-04 LAB — PREPARE RBC (CROSSMATCH)

## 2018-04-04 LAB — I-STAT TROPONIN, ED: Troponin i, poc: 0.01 ng/mL (ref 0.00–0.08)

## 2018-04-04 LAB — TROPONIN I: Troponin I: <0.03 ng/mL (ref <0.03)

## 2018-04-04 MED ORDER — PANTOPRAZOLE SODIUM 40 MG PO TBEC
40.0000 mg | DELAYED_RELEASE_TABLET | Freq: Every day | ORAL | Status: DC
  Administered 2018-04-04 – 2018-04-06 (×3): 40 mg via ORAL
  Filled 2018-04-04 (×3): qty 1

## 2018-04-04 MED ORDER — LOPERAMIDE HCL 2 MG PO CAPS
4.0000 mg | ORAL_CAPSULE | ORAL | Status: DC | PRN
  Administered 2018-04-04: 4 mg via ORAL
  Filled 2018-04-04: qty 2

## 2018-04-04 MED ORDER — SODIUM CHLORIDE 0.9% FLUSH: 3.0000 mL | INTRAVENOUS | Status: DC | PRN

## 2018-04-04 MED ORDER — POLYETHYLENE GLYCOL 3350 17 G PO PACK
17.0000 g | PACK | Freq: Every day | ORAL | Status: DC
Start: 2018-04-04 — End: 2018-04-06
  Administered 2018-04-04 – 2018-04-06 (×3): 17 g via ORAL
  Filled 2018-04-04 (×3): qty 1

## 2018-04-04 MED ORDER — FERROUS SULFATE 325 (65 FE) MG PO TABS
325.0000 mg | ORAL_TABLET | Freq: Two times a day (BID) | ORAL | Status: DC
  Administered 2018-04-04 – 2018-04-06 (×4): 325 mg via ORAL
  Filled 2018-04-04 (×5): qty 1

## 2018-04-04 MED ORDER — SERTRALINE HCL 50 MG PO TABS
75.0000 mg | ORAL_TABLET | Freq: Every day | ORAL | Status: DC
  Administered 2018-04-04 – 2018-04-06 (×3): 75 mg via ORAL
  Filled 2018-04-04 (×3): qty 2

## 2018-04-04 MED ORDER — POTASSIUM CHLORIDE CRYS ER 20 MEQ PO TBCR
20.0000 meq | EXTENDED_RELEASE_TABLET | Freq: Every day | ORAL | Status: DC
  Administered 2018-04-04 – 2018-04-06 (×3): 20 meq via ORAL
  Filled 2018-04-04 (×3): qty 1

## 2018-04-04 MED ORDER — ADULT MULTIVITAMIN W/MINERALS CH
1.0000 | ORAL_TABLET | Freq: Every day | ORAL | Status: DC
  Administered 2018-04-04 – 2018-04-06 (×3): 1 via ORAL
  Filled 2018-04-04 (×3): qty 1

## 2018-04-04 MED ORDER — ONDANSETRON HCL 4 MG PO TABS
4.0000 mg | ORAL_TABLET | Freq: Four times a day (QID) | ORAL | Status: DC | PRN
Start: 2018-04-04 — End: 2018-04-06

## 2018-04-04 MED ORDER — SODIUM CHLORIDE 0.9 % IV SOLN
10.0000 mL/h | Freq: Once | INTRAVENOUS | Status: AC
  Administered 2018-04-04: 10 mL/h via INTRAVENOUS

## 2018-04-04 MED ORDER — LEVOTHYROXINE SODIUM 100 MCG PO TABS
100.0000 ug | ORAL_TABLET | Freq: Every day | ORAL | Status: DC
  Administered 2018-04-04 – 2018-04-06 (×3): 100 ug via ORAL
  Filled 2018-04-04 (×3): qty 1

## 2018-04-04 MED ORDER — HEPARIN SODIUM (PORCINE) 5000 UNIT/ML IJ SOLN
5000.0000 [IU] | Freq: Three times a day (TID) | INTRAMUSCULAR | Status: DC
  Administered 2018-04-04 – 2018-04-06 (×7): 5000 [IU] via SUBCUTANEOUS
  Filled 2018-04-04 (×7): qty 1

## 2018-04-04 MED ORDER — ENSURE ENLIVE PO LIQD
237.0000 mL | Freq: Three times a day (TID) | ORAL | Status: DC
  Administered 2018-04-04 – 2018-04-06 (×5): 237 mL via ORAL

## 2018-04-04 MED ORDER — MAGNESIUM OXIDE 400 (241.3 MG) MG PO TABS
400.0000 mg | ORAL_TABLET | Freq: Every day | ORAL | Status: DC
  Administered 2018-04-04 – 2018-04-06 (×3): 400 mg via ORAL
  Filled 2018-04-04 (×3): qty 1

## 2018-04-04 MED ORDER — MUSCLE RUB 10-15 % EX CREA
1.0000 "application " | TOPICAL_CREAM | Freq: Two times a day (BID) | CUTANEOUS | Status: DC | PRN
  Filled 2018-04-04: qty 85

## 2018-04-04 MED ORDER — FUROSEMIDE 20 MG PO TABS
10.0000 mg | ORAL_TABLET | Freq: Every day | ORAL | Status: DC
  Administered 2018-04-04 – 2018-04-06 (×3): 10 mg via ORAL
  Filled 2018-04-04 (×3): qty 1

## 2018-04-04 MED ORDER — SENNOSIDES-DOCUSATE SODIUM 8.6-50 MG PO TABS
2.0000 | ORAL_TABLET | Freq: Two times a day (BID) | ORAL | Status: DC
  Administered 2018-04-04 – 2018-04-06 (×4): 2 via ORAL
  Filled 2018-04-04 (×6): qty 2

## 2018-04-04 MED ORDER — CARVEDILOL 6.25 MG PO TABS
6.2500 mg | ORAL_TABLET | Freq: Two times a day (BID) | ORAL | Status: DC
  Administered 2018-04-04 – 2018-04-06 (×4): 6.25 mg via ORAL
  Filled 2018-04-04 (×5): qty 1

## 2018-04-04 MED ORDER — SODIUM CHLORIDE 0.9 % IV SOLN: 250.0000 mL | INTRAVENOUS | Status: DC | PRN

## 2018-04-04 MED ORDER — SODIUM CHLORIDE 0.9% FLUSH
3.0000 mL | Freq: Two times a day (BID) | INTRAVENOUS | Status: DC
  Administered 2018-04-04 – 2018-04-06 (×4): 3 mL via INTRAVENOUS

## 2018-04-04 MED ORDER — LEVETIRACETAM 500 MG PO TABS
500.0000 mg | ORAL_TABLET | Freq: Two times a day (BID) | ORAL | Status: DC
  Administered 2018-04-04 – 2018-04-06 (×6): 500 mg via ORAL
  Filled 2018-04-04 (×6): qty 1

## 2018-04-04 NOTE — Progress Notes (Signed)
[facility]                                                                              Patient Demographics  Jean Hodges, is a 84 y.o. female, DOB - 1933-11-08, FEO:[id number]  Admit date - 04/03/2018   Admitting Physician Gwynne Edinger, MD  Outpatient Primary MD for the patient is Patient, No Pcp Per  Outpatient specialists:   LOS - 0  days    Chief Complaint  Patient presents with  . Chest Pain       Brief summary   Jean Hodges is a 84 y.o. female with medical history significant for advanced dementia, severe malnutrition, bedbound status, t2dm, htn, nicm s/p pacemaker, paroxysmal a-fib, chronic anemia thought to be 2/2 myelodysplastic process (has declined w/u), with recent admission (02/2018) for severe anemia thought to be 2/2 myelodysplastic process, presenting via ems for chest pain specific chest pain. Patient noted to be severely anemic without any history of hematochezia or hematemesis nor melena.    Assessment & Plan    Active Problems:   Tonic-clonic seizure disorder ([place])   Moderate protein-calorie malnutrition ([place])   Hypothyroidism (acquired)   Type 2 diabetes mellitus with peripheral vascular disease ([place])   PVD (peripheral vascular disease) ([place])   Essential hypertension   Alzheimer's dementia   Coronary artery disease involving native coronary artery of native heart without angina pectoris   Symptomatic anemia   Macrocytic anemia   Pressure injury of skin   Hx of BKA, right ([place])  # Subacute macrocytic anemia - Recurrently. Likely 2/2 myelodysplastic syndrome (followed by heme, pt has declined w/u). Hemoccult neg. Could be cause of CP, currently does not appear to be in pain. Unfortunately son does not appear to have a full grasp of the severity and terminal nature of the patient's illness.  - 2 u prbcs ordered - am cbc  # chest pain - not currently complaining of pain. Could be 2/2 demand from anemia. Initial trop neg, ekg  non-ischemic. CXR w/o acute changes. Confirmed w/ son that patient is full code. - repeat am trop - tele - am ekg  # seizure disorder - home keppra  # stage 2 sacral decub - wound care  # severe malnutrition - continue home suplement  # hypothyroid - home t4  # htn - home lasix, coreg  # mdd - home sertraline    Code Status: full code DVT Prophylaxis:  heparin Family Communication:   Disposition Plan: currently undergoing  Blastic transformation and anticipate discharge home tomorrow  Time Spent in minutes  32 minutes  Procedures:   Consultants:     Antimicrobials:      Medications  Scheduled Meds: . carvedilol  6.25 mg Oral BID WC  . ferrous sulfate  325 mg Oral BID WC  . furosemide  10 mg Oral Daily  . heparin  5,000 Units Subcutaneous Q8H  . levETIRAcetam  500 mg Oral BID  . levothyroxine  100 mcg Oral QAC breakfast  . magnesium oxide  400 mg Oral Daily  . pantoprazole  40 mg Oral Daily  . polyethylene glycol  17 g Oral  Daily  . potassium chloride SA  20 mEq Oral Daily  . senna-docusate  2 tablet Oral BID  . sertraline  75 mg Oral Daily  . sodium chloride flush  3 mL Intravenous Q12H   Continuous Infusions: . sodium chloride     PRN Meds:.sodium chloride, MUSCLE RUB, ondansetron, sodium chloride flush   Antibiotics   Anti-infectives (From admission, onward)   None        Subjective:   Jean Hodges was seen and examined today.  No chest pain, shortness of breath, currently receiving PRBC transfusion  Objective:   Vitals:   04/04/18 0417 04/04/18 0435 04/04/18 0450 04/04/18 0512  BP: (!) 133/57 (!) 136/59  (!) 137/59  Pulse: 78 79  79  Resp: 16 16  16   Temp: 98.2 F (36.8 C) (!) 97.3 F (36.3 C)  98.1 F (36.7 C)  TempSrc: Oral Oral Oral Oral  SpO2: 98% 99%  97%  Weight:      Height:        Intake/Output Summary (Last 24 hours) at 04/04/2018 1043 Last data filed at 04/04/2018 1032 Gross per 24 hour  Intake  637.83 ml  Output -  Net 637.83 ml     Wt Readings from Last 3 Encounters:  04/04/18 48.8 kg  02/21/18 47.7 kg  01/19/18 49.4 kg     Exam  General: NAD  HEENT: NCAT, (+) scleral pallorL,MMM  Neck: SUPPLE, (-) JVD  Cardiovascular: RRR, (-) GALLOP, (-) MURMUR  Respiratory: CTA  Gastrointestinal: SOFT, (-) DISTENSION, BS(+), (_) TENDERNESS  Ext: (-) CYANOSIS, (-) EDEMA  Neuro: A, OX 3  Skin:(-) RASH  Psych:NORMAL AFFECT/MOOD   Data Reviewed:  I have personally reviewed following labs and imaging studies  Micro Results Recent Results (from the past 240 hour(s))  MRSA PCR Screening     Status: Abnormal   Collection Time: 04/04/18  3:32 AM  Result Value Ref Range Status   MRSA by PCR POSITIVE (A) NEGATIVE Final    Comment:        The GeneXpert MRSA Assay (FDA approved for NASAL specimens only), is one component of a comprehensive MRSA colonization surveillance program. It is not intended to diagnose MRSA infection nor to guide or monitor treatment for MRSA infections. RESULT CALLED TO, READ BACK BY AND VERIFIED WITH: K. BRACEY @ 5035 ON 04/04/18 BY ROBINSON Z. Performed at [facility], [address], [place],  [address]     Radiology Reports Dg Chest 2 View  Result Date: 04/03/2018 CLINICAL DATA:  Chest pain EXAM: CHEST - 2 VIEW COMPARISON:  CT 01/16/2018, radiograph 12/29/2017 FINDINGS: Post sternotomy changes. Left-sided pacing device as before. Cardiomegaly with vascular congestion and hazy perihilar edema. Moderate bilateral pleural effusions. Aortic atherosclerosis. No pneumothorax. Bibasilar airspace disease. IMPRESSION: 1. Cardiomegaly with vascular congestion and hazy perihilar edema. 2. Continued moderate pleural effusions with bibasilar consolidations. Electronically Signed   By: Donavan Foil M.D.   On: 04/03/2018 23:56    Lab Data:  CBC: Recent Labs  Lab 04/03/18 2318 04/04/18 0006  WBC 7.6  --   HGB 7.1* 6.8*  HCT 21.9*  20.0*  MCV 102.3*  --   PLT 150  --    Basic Metabolic Panel: Recent Labs  Lab 04/04/18 0006  NA 132*  K 4.6  CL 96*  GLUCOSE 108*  BUN 15  CREATININE 0.40*   GFR: Estimated Creatinine Clearance: 41.8 mL/min (A) (by C-G formula based on SCr of 0.4 mg/dL (L)). Liver Function  Tests: No results for input(s): AST, ALT, ALKPHOS, BILITOT, PROT, ALBUMIN in the last 168 hours. No results for input(s): LIPASE, AMYLASE in the last 168 hours. No results for input(s): AMMONIA in the last 168 hours. Coagulation Profile: No results for input(s): INR, PROTIME in the last 168 hours. Cardiac Enzymes: No results for input(s): CKTOTAL, CKMB, CKMBINDEX, TROPONINI in the last 168 hours. BNP (last 3 results) No results for input(s): PROBNP in the last 8760 hours. HbA1C: No results for input(s): HGBA1C in the last 72 hours. CBG: Recent Labs  Lab 04/04/18 0722  GLUCAP 94   Lipid Profile: No results for input(s): CHOL, HDL, LDLCALC, TRIG, CHOLHDL, LDLDIRECT in the last 72 hours. Thyroid Function Tests: No results for input(s): TSH, T4TOTAL, FREET4, T3FREE, THYROIDAB in the last 72 hours. Anemia Panel: No results for input(s): VITAMINB12, FOLATE, FERRITIN, TIBC, IRON, RETICCTPCT in the last 72 hours. Urine analysis:    Component Value Date/Time   COLORURINE YELLOW 02/20/2018 1827   APPEARANCEUR HAZY (A) 02/20/2018 1827   LABSPEC 1.010 02/20/2018 1827   PHURINE 6.0 02/20/2018 1827   GLUCOSEU NEGATIVE 02/20/2018 1827   HGBUR NEGATIVE 02/20/2018 1827   BILIRUBINUR NEGATIVE 02/20/2018 1827   KETONESUR NEGATIVE 02/20/2018 1827   PROTEINUR NEGATIVE 02/20/2018 1827   NITRITE NEGATIVE 02/20/2018 1827   LEUKOCYTESUR LARGE (A) 02/20/2018 1827     Benito Mccreedy M.D. [facility] 04/04/2018, 10:43 AM  Pager: [phone] Between 7am to 7pm - call Pager - [phone]  After 7pm go to [email or web address] - password [email or web address]  Call night coverage person covering after 7pm

## 2018-04-04 NOTE — Progress Notes (Signed)
Initial Nutrition Assessment  DOCUMENTATION CODES:   Severe malnutrition in context of chronic illness  INTERVENTION:  - Will order Ensure Enlive TID, each supplement provides 350 kcal and 20 grams of protein. - Will order daily multivitamin with minerals.  - Continue to encourage PO intakes and tech/RN to assist with feeding if needed.    NUTRITION DIAGNOSIS:   Severe Malnutrition related to chronic illness(advanced dementia) as evidenced by severe fat depletion, severe muscle depletion.  GOAL:   Patient will meet greater than or equal to 90% of their needs  MONITOR:   PO intake, Supplement acceptance, Weight trends, Labs, Skin  REASON FOR ASSESSMENT:   Malnutrition Screening Tool  ASSESSMENT:   84 y.o. female with medical history significant for advanced dementia, severe malnutrition, bedbound status, t2dm, htn, nicm s/p pacemaker, paroxysmal a-fib, chronic anemia thought to be 2/2 myelodysplastic process (has declined w/u), with recent admission (02/2018) for severe anemia thought to be 2/2 myelodysplastic process, presenting via ems for chest pain.  BMI indicates normal weight. No intakes documented and no family/visitors present at bedside. Flow sheet indicates that patient is a/o to self and place. She denies abdominal pain or nausea and she thinks that she had breakfast but is unable to recall any details about this meal.   Patient with R BKA; unsure of date of amputation. Per chart review, weight has been relatively stable since March 2019. NFPE outlined below. Patient very cachetic in appearance and all ribs are visible.    Medications reviewed; 325 mg ferrous sulfate BID, 100 mcg oral Synthroid/day, 400 mg Mag-ox/day, 1 packet Miralax/day, 20 mEq oral KCl/day, 2 tablets Senokot BID. Labs reviewed; Na: 132 mmol/L, Cl: 96 mmol/L, creatinine: 0.4 mg/dL.    NUTRITION - FOCUSED PHYSICAL EXAM:    Most Recent Value  Orbital Region  Moderate depletion  Upper Arm Region   Severe depletion  Thoracic and Lumbar Region  Severe depletion  Buccal Region  Moderate depletion  Temple Region  Severe depletion  Clavicle Bone Region  Severe depletion  Clavicle and Acromion Bone Region  Severe depletion  Scapular Bone Region  Unable to assess  Dorsal Hand  Unable to assess  Patellar Region  Unable to assess  Anterior Thigh Region  Unable to assess  Posterior Calf Region  Unable to assess  Edema (RD Assessment)  Unable to assess  Hair  Reviewed  Eyes  Reviewed  Mouth  Reviewed  Skin  Reviewed  Nails  Reviewed       Diet Order:   Diet Order            DIET - DYS 1 Room service appropriate? Yes; Fluid consistency: Thin  Diet effective now              EDUCATION NEEDS:   Not appropriate for education at this time  Skin:  Skin Assessment: Skin Integrity Issues: Skin Integrity Issues:: Stage II Stage II: coccyx  Last BM:  9/21  Height:   Ht Readings from Last 1 Encounters:  04/04/18 5\' 2"  (1.575 m)    Weight:   Wt Readings from Last 1 Encounters:  04/04/18 48.8 kg    Ideal Body Weight:  50 kg  BMI:  Body mass index is 19.68 kg/m.  Estimated Nutritional Needs:   Kcal:  1465-1660 (30-34 kcal/kg)  Protein:  55-65 grams  Fluid:  >/= 1.5 L/day     Jarome Matin, MS, RD, LDN, [facility] Inpatient Clinical Dietitian Pager # [phone] After hours/weekend pager # [phone]

## 2018-04-04 NOTE — Progress Notes (Signed)
Patient is having multiple episodes of diarrhea. MD was paged and placed orders for Imodium PRN. Will continue to monitor.  Drue Flirt, RN

## 2018-04-04 NOTE — ED Notes (Signed)
Dr Palumbo informed of chem 8 results  

## 2018-04-04 NOTE — Progress Notes (Signed)
Patient was bleeding from past heparin puncture site. A pressure dressing was applied and MD was paged. No new orders at this time.  Drue Flirt, RN

## 2018-04-04 NOTE — H&P (Addendum)
History and Physical    Jean Hodges XQJ:[id number] DOB: 02/15/1934 DOA: 04/03/2018  PCP: Patient, No Pcp Per  Patient coming from: snf   Chief Complaint: chest pain  HPI: Jean Hodges is a 84 y.o. female with medical history significant for advanced dementia, severe malnutrition, bedbound status, t2dm, htn, nicm s/p pacemaker, paroxysmal a-fib, chronic anemia thought to be 2/2 myelodysplastic process (has declined w/u), with recent admission (02/2018) for severe anemia thought to be 2/2 myelodysplastic process, presenting via ems for chest pain.  History is per patient's son as patient's dementia precludes history taking. Per son patient complained of chest pain to her SNF nurse this evening. Son says mother does have various aches and pains. Son denies hearing report of hematochezia or melena or hematemesis. Denies history MI.   ED Course: 2 u prbcs ordered (h 6.8)  Review of Systems: As per HPI otherwise 10 point review of systems negative.    Past Medical History:  Diagnosis Date  . Acute encephalopathy   . Acute encephalopathy 09/30/2016  . Alzheimer's dementia without behavioral disturbance   . Alzheimer's dementia without behavioral disturbance 10/07/2016  . Anemia   . Anemia 01/28/2014  . Atherosclerotic PVD with ulceration ([place])   . CAD (coronary artery disease)   . CAD (coronary artery disease) 01/04/2014  . Cellulitis   . Cellulitis of left leg   . CHF (congestive heart failure) ([place])   . Diabetes mellitus without complication ([place])   . DM (diabetes mellitus) ([place]) 01/27/2014  . Dysphagia   . HTN (hypertension) 01/04/2014  . Hyperlipidemia   . Hypertension   . Hypoglycemia associated with type 2 diabetes mellitus ([place])   . Macrocytic anemia   . Seizures ([place]) 09/30/2016  . Tear of left rotator cuff   . UTI (urinary tract infection)   . UTI (urinary tract infection) 09/25/2016    Past Surgical History:  Procedure Laterality Date  . BELOW KNEE LEG AMPUTATION  Right   . PACEMAKER IMPLANT       reports that she has never smoked. She has never used smokeless tobacco. She reports that she does not drink alcohol or use drugs.  Allergies  Allergen Reactions  . Acetaminophen Other (See Comments)    Unknown  . Codeine Nausea Only  . Dilaudid [Hydromorphone] Nausea Only  . Hydrocodone Other (See Comments)    Hallucinations  . Keflex [Cephalexin] Diarrhea    Family History  Family history unknown: Yes    Prior to Admission medications   Medication Sig Start Date End Date Taking? Authorizing Provider  carvedilol (COREG) 6.25 MG tablet Take 6.25 mg by mouth 2 (two) times daily with a meal.   Yes [provider]  ferrous sulfate 325 (65 FE) MG tablet Take 325 mg by mouth 2 (two) times daily with a meal.   Yes [provider]  furosemide (LASIX) 20 MG tablet Take 1 tablet (20 mg total) by mouth daily. Patient taking differently: Take 10 mg by mouth daily.  11/14/17 11/14/18 Yes Jani Gravel, MD  levETIRAcetam (KEPPRA) 500 MG tablet Take 500 mg by mouth 2 (two) times daily.   Yes [provider]  levothyroxine (SYNTHROID, LEVOTHROID) 100 MCG tablet Take 100 mcg by mouth daily before breakfast.   Yes [provider]  magnesium oxide (MAG-OX) 400 MG tablet Take 400 mg by mouth daily.   Yes [provider]  Menthol, Topical Analgesic, (BIOFREEZE) 4 % GEL Apply 1 application topically 2 (two) times daily as needed (pain).  Yes [provider]  Multiple Vitamins-Minerals (DECUBI-VITE PO) Take 1 capsule by mouth daily.   Yes [provider]  ondansetron (ZOFRAN) 4 MG tablet Take 4 mg by mouth every 6 (six) hours as needed for nausea or vomiting.   Yes [provider]  pantoprazole (PROTONIX) 40 MG tablet Take 1 tablet (40 mg total) by mouth daily. 07/25/17  Yes Barton Dubois, MD  polyethylene glycol [facility] / GLYCOLAX) packet Take 17 g by mouth daily.   Yes [provider]    potassium chloride SA (K-DUR,KLOR-CON) 20 MEQ tablet Take 20 mEq by mouth daily.   Yes [provider]  sennosides-docusate sodium (SENOKOT-S) 8.6-50 MG tablet Take 2 tablets by mouth 2 (two) times daily.   Yes [provider]  sertraline (ZOLOFT) 25 MG tablet Take 75 mg by mouth daily.   Yes [provider]  feeding supplement, ENSURE ENLIVE, (ENSURE ENLIVE) LIQD Take 237 mLs by mouth 2 (two) times daily between meals. Patient not taking: Reported on 04/03/2018 02/21/18   Cristal Ford, DO    Physical Exam: Vitals:   04/04/18 0000 04/04/18 0015 04/04/18 0030 04/04/18 0045  BP: (!) 133/52 (!) 135/49 (!) 135/50 (!) 131/54  Pulse: 78 79 78 79  Resp: 18 18 18 17   Temp:      TempSrc:      SpO2: 98% 99% 98% 98%    Constitutional: pale, cachectic Head: temporal wasting Eyes: Conjunctiva clear ENM: dry mucous membranes, poor dentition  Neck: Supple Respiratory: poor inspiratory effort. Faint rales at bases, no wheeze Cardiovascular: Regular rate and rhythm. Systolic murmur. Distant heart sounds Abdomen: Non-tender, non-distended. No masses. No rebound or guarding. Positive bowel sounds. Musculoskeletal: r bka. Muscle wasting Skin: 2 cm stage 2 sacral decubitus ulcer Extremities: No peripheral edema. Palpable peripheral pulses. Neurologic: awake and alert, smiling, moving all 4 extremities Psychiatric: oriented x0.   Labs on Admission: I have personally reviewed following labs and imaging studies  CBC: Recent Labs  Lab 04/03/18 2318 04/04/18 0006  WBC 7.6  --   HGB 7.1* 6.8*  HCT 21.9* 20.0*  MCV 102.3*  --   PLT 150  --    Basic Metabolic Panel: Recent Labs  Lab 04/04/18 0006  NA 132*  K 4.6  CL 96*  GLUCOSE 108*  BUN 15  CREATININE 0.40*   GFR: CrCl cannot be calculated (Unknown ideal weight.). Liver Function Tests: No results for input(s): AST, ALT, ALKPHOS, BILITOT, PROT, ALBUMIN in the last 168 hours. No results for input(s):  LIPASE, AMYLASE in the last 168 hours. No results for input(s): AMMONIA in the last 168 hours. Coagulation Profile: No results for input(s): INR, PROTIME in the last 168 hours. Cardiac Enzymes: No results for input(s): CKTOTAL, CKMB, CKMBINDEX, TROPONINI in the last 168 hours. BNP (last 3 results) No results for input(s): PROBNP in the last 8760 hours. HbA1C: No results for input(s): HGBA1C in the last 72 hours. CBG: No results for input(s): GLUCAP in the last 168 hours. Lipid Profile: No results for input(s): CHOL, HDL, LDLCALC, TRIG, CHOLHDL, LDLDIRECT in the last 72 hours. Thyroid Function Tests: No results for input(s): TSH, T4TOTAL, FREET4, T3FREE, THYROIDAB in the last 72 hours. Anemia Panel: No results for input(s): VITAMINB12, FOLATE, FERRITIN, TIBC, IRON, RETICCTPCT in the last 72 hours. Urine analysis:    Component Value Date/Time   COLORURINE YELLOW 02/20/2018 1827   APPEARANCEUR HAZY (A) 02/20/2018 1827   LABSPEC 1.010 02/20/2018 1827   PHURINE 6.0 02/20/2018 1827  GLUCOSEU NEGATIVE 02/20/2018 1827   HGBUR NEGATIVE 02/20/2018 1827   BILIRUBINUR NEGATIVE 02/20/2018 1827   KETONESUR NEGATIVE 02/20/2018 1827   PROTEINUR NEGATIVE 02/20/2018 1827   NITRITE NEGATIVE 02/20/2018 1827   LEUKOCYTESUR LARGE (A) 02/20/2018 1827    Radiological Exams on Admission: Dg Chest 2 View  Result Date: 04/03/2018 CLINICAL DATA:  Chest pain EXAM: CHEST - 2 VIEW COMPARISON:  CT 01/16/2018, radiograph 12/29/2017 FINDINGS: Post sternotomy changes. Left-sided pacing device as before. Cardiomegaly with vascular congestion and hazy perihilar edema. Moderate bilateral pleural effusions. Aortic atherosclerosis. No pneumothorax. Bibasilar airspace disease. IMPRESSION: 1. Cardiomegaly with vascular congestion and hazy perihilar edema. 2. Continued moderate pleural effusions with bibasilar consolidations. Electronically Signed   By: Donavan Foil M.D.   On: 04/03/2018 23:56    EKG: Independently  reviewed. Nsr, lad, lbb, no ischemic changes  Assessment/Plan Active Problems:   Tonic-clonic seizure disorder (HCC)   Moderate protein-calorie malnutrition (HCC)   Hypothyroidism (acquired)   Type 2 diabetes mellitus with peripheral vascular disease (HCC)   PVD (peripheral vascular disease) ([place])   Essential hypertension   Alzheimer's dementia   Coronary artery disease involving native coronary artery of native heart without angina pectoris   Symptomatic anemia   Macrocytic anemia   Pressure injury of skin   Hx of BKA, right ([place])   # Subacute macrocytic anemia - Recurrently. Likely 2/2 myelodysplastic syndrome (followed by heme, pt has declined w/u). Hemoccult neg. Could be cause of CP, currently does not appear to be in pain. Unfortunately son does not appear to have a full grasp of the severity and terminal nature of the patient's illness.  - 2 u prbcs ordered - am cbc  # chest pain - not currently complaining of pain. Could be 2/2 demand from anemia. Initial trop neg, ekg non-ischemic. CXR w/o acute changes. Confirmed w/ son that patient is full code. - repeat am trop - tele - am ekg  # seizure disorder - home keppra  # stage 2 sacral decub - wound care  # severe malnutrition - continue home suplement  # hypothyroid - home t4  # htn - home lasix, coreg  # mdd - home sertraline  DVT prophylaxis: heparin, ted Code Status: full code  Family Communication: son Ron  Disposition Plan: tbd  Consults called: none  Admission status: obs tele    Desma Maxim MD [facility] Pager [phone]  If 7PM-7AM, please contact night-coverage [email or web address] Password [email or web address]  04/04/2018, 1:06 AM

## 2018-04-04 NOTE — ED Provider Notes (Signed)
[facility] EMERGENCY DEPARTMENT Provider Note   CSN: [id number] Arrival date & time: 04/03/18  2309     History   Chief Complaint Chief Complaint  Patient presents with  . Chest Pain    HPI Jean Hodges is a 84 y.o. female.  The history is provided by the patient and the EMS personnel. The history is limited by the condition of the patient (level 5 dementia).  Chest Pain   This is a recurrent problem. The current episode started 6 to 12 hours ago. The problem occurs constantly. The problem has not changed since onset.The pain is associated with rest. The pain is present in the substernal region. The pain is moderate. The quality of the pain is described as dull. The pain does not radiate. Associated symptoms include malaise/fatigue. Pertinent negatives include no abdominal pain. She has tried nothing for the symptoms. The treatment provided no relief. Risk factors include being elderly.  Pertinent negatives for past medical history include no Kawasaki disease.  Pertinent negatives for family medical history include: no Marfan's syndrome.  Procedure history is negative for stress echo.    Past Medical History:  Diagnosis Date  . Acute encephalopathy   . Acute encephalopathy 09/30/2016  . Alzheimer's dementia without behavioral disturbance   . Alzheimer's dementia without behavioral disturbance 10/07/2016  . Anemia   . Anemia 01/28/2014  . Atherosclerotic PVD with ulceration ([place])   . CAD (coronary artery disease)   . CAD (coronary artery disease) 01/04/2014  . Cellulitis   . Cellulitis of left leg   . CHF (congestive heart failure) ([place])   . Diabetes mellitus without complication ([place])   . DM (diabetes mellitus) ([place]) 01/27/2014  . Dysphagia   . HTN (hypertension) 01/04/2014  . Hyperlipidemia   . Hypertension   . Hypoglycemia associated with type 2 diabetes mellitus ([place])   . Macrocytic anemia   . Seizures ([place]) 09/30/2016  . Tear of left rotator  cuff   . UTI (urinary tract infection)   . UTI (urinary tract infection) 09/25/2016    Patient Active Problem List   Diagnosis Date Noted  . Chronic systolic CHF (congestive heart failure) ([place]) 02/20/2018  . Sacral decubitus ulcer 02/20/2018  . Counseling regarding advanced care planning and goals of care 01/17/2018  . Large stool   . Abdominal pain 01/16/2018  . Seizure disorder ([place]) 12/29/2017  . Memory loss 12/02/2017  . Bruising, spontaneous 12/01/2017  . Adult failure to thrive   . Palliative care by specialist   . Palliative care encounter   . Altered mental status   . Pressure injury of skin 11/04/2017  . Hyperkalemia 11/04/2017  . UTI (urinary tract infection) 11/03/2017  . Severe protein-calorie malnutrition ([place]) 10/10/2017  . Anemia 07/23/2017  . Symptomatic anemia 11/27/2016  . Tonic-clonic seizure disorder ([place]) 10/09/2016  . Dysphagia 10/09/2016  . Moderate protein-calorie malnutrition ([place]) 10/09/2016  . Hypothyroidism (acquired) 10/09/2016  . Type 2 diabetes mellitus with peripheral vascular disease ([place]) 10/09/2016  . PVD (peripheral vascular disease) ([place]) 10/09/2016  . Essential hypertension 10/09/2016  . Alzheimer's dementia 10/09/2016  . Chronic depression 10/09/2016  . Hyperlipidemia 10/09/2016  . Coronary artery disease involving native coronary artery of native heart without angina pectoris 10/09/2016    Past Surgical History:  Procedure Laterality Date  . BELOW KNEE LEG AMPUTATION Right   . PACEMAKER IMPLANT       OB History   None      Home Medications    Prior  to Admission medications   Medication Sig Start Date End Date Taking? Authorizing Provider  carvedilol (COREG) 6.25 MG tablet Take 6.25 mg by mouth 2 (two) times daily with a meal.   Yes [provider]  ferrous sulfate 325 (65 FE) MG tablet Take 325 mg by mouth 2 (two) times daily with a meal.   Yes [provider]  furosemide (LASIX) 20 MG tablet Take 1  tablet (20 mg total) by mouth daily. Patient taking differently: Take 10 mg by mouth daily.  11/14/17 11/14/18 Yes Jani Gravel, MD  levETIRAcetam (KEPPRA) 500 MG tablet Take 500 mg by mouth 2 (two) times daily.   Yes [provider]  levothyroxine (SYNTHROID, LEVOTHROID) 100 MCG tablet Take 100 mcg by mouth daily before breakfast.   Yes [provider]  magnesium oxide (MAG-OX) 400 MG tablet Take 400 mg by mouth daily.   Yes [provider]  Menthol, Topical Analgesic, (BIOFREEZE) 4 % GEL Apply 1 application topically 2 (two) times daily as needed (pain).   Yes [provider]  Multiple Vitamins-Minerals (DECUBI-VITE PO) Take 1 capsule by mouth daily.   Yes [provider]  ondansetron (ZOFRAN) 4 MG tablet Take 4 mg by mouth every 6 (six) hours as needed for nausea or vomiting.   Yes [provider]  pantoprazole (PROTONIX) 40 MG tablet Take 1 tablet (40 mg total) by mouth daily. 07/25/17  Yes Barton Dubois, MD  polyethylene glycol [facility] / GLYCOLAX) packet Take 17 g by mouth daily.   Yes [provider]  potassium chloride SA (K-DUR,KLOR-CON) 20 MEQ tablet Take 20 mEq by mouth daily.   Yes [provider]  sennosides-docusate sodium (SENOKOT-S) 8.6-50 MG tablet Take 2 tablets by mouth 2 (two) times daily.   Yes [provider]  sertraline (ZOLOFT) 25 MG tablet Take 75 mg by mouth daily.   Yes [provider]  feeding supplement, ENSURE ENLIVE, (ENSURE ENLIVE) LIQD Take 237 mLs by mouth 2 (two) times daily between meals. Patient not taking: Reported on 04/03/2018 02/21/18   Cristal Ford, DO    Family History Family History  Family history unknown: Yes    Social History Social History   Tobacco Use  . Smoking status: Never Smoker  . Smokeless tobacco: Never Used  Substance Use Topics  . Alcohol use: No  . Drug use: No     Allergies   Acetaminophen; Codeine; Dilaudid [hydromorphone]; Hydrocodone;  and Keflex [cephalexin]   Review of Systems Review of Systems  Unable to perform ROS: Dementia  Constitutional: Positive for fatigue and malaise/fatigue.  Cardiovascular: Positive for chest pain.  Gastrointestinal: Negative for abdominal pain.     Physical Exam Updated Vital Signs BP (!) 133/52   Pulse 78   Temp 98.3 F (36.8 C) (Oral)   Resp 18   SpO2 98%   Physical Exam  Constitutional: She appears well-developed and well-nourished.  HENT:  Head: Normocephalic and atraumatic.  Mouth/Throat: No oropharyngeal exudate.  Eyes: Pupils are equal, round, and reactive to light. Conjunctivae are normal.  Neck: Normal range of motion. Neck supple.  Cardiovascular: Normal rate, regular rhythm, normal heart sounds and intact distal pulses.  Pulmonary/Chest: Effort normal. No stridor. She has no wheezes. She has no rales.  Abdominal: Soft. Bowel sounds are normal. She exhibits no mass. There is no tenderness. There is no rebound and no guarding.  Musculoskeletal: Normal range of motion.  Neurological: She is alert. She displays normal reflexes.  Skin: Skin is warm  and dry. Capillary refill takes less than 2 seconds. She is not diaphoretic.  Psychiatric: She has a normal mood and affect.     ED Treatments / Results  Labs (all labs ordered are listed, but only abnormal results are displayed) Results for orders placed or performed during the hospital encounter of 04/03/18  CBC  Result Value Ref Range   WBC 7.6 4.0 - 10.5 K/uL   RBC 2.14 (L) 3.87 - 5.11 MIL/uL   Hemoglobin 7.1 (L) 12.0 - 15.0 g/dL   HCT 21.9 (L) 36.0 - 46.0 %   MCV 102.3 (H) 78.0 - 100.0 fL   MCH 33.2 26.0 - 34.0 pg   MCHC 32.4 30.0 - 36.0 g/dL   RDW 18.6 (H) 11.5 - 15.5 %   Platelets 150 150 - 400 K/uL  I-stat troponin, ED  Result Value Ref Range   Troponin i, poc 0.01 0.00 - 0.08 ng/mL   Comment 3          I-Stat Chem 8, ED  Result Value Ref Range   Sodium 132 (L) 135 - 145 mmol/L   Potassium 4.6 3.5 -  5.1 mmol/L   Chloride 96 (L) 98 - 111 mmol/L   BUN 15 8 - 23 mg/dL   Creatinine, Ser 0.40 (L) 0.44 - 1.00 mg/dL   Glucose, Bld 108 (H) 70 - 99 mg/dL   Calcium, Ion 1.18 1.15 - 1.40 mmol/L   TCO2 27 22 - 32 mmol/L   Hemoglobin 6.8 (LL) 12.0 - 15.0 g/dL   HCT 20.0 (L) 36.0 - 46.0 %   Comment NOTIFIED PHYSICIAN   Type and screen [place]  Result Value Ref Range   ABO/RH(D) A POS    Antibody Screen NEG    Sample Expiration      04/06/2018 Performed at [facility], [address], [place],  [address]    Dg Chest 2 View  Result Date: 04/03/2018 CLINICAL DATA:  Chest pain EXAM: CHEST - 2 VIEW COMPARISON:  CT 01/16/2018, radiograph 12/29/2017 FINDINGS: Post sternotomy changes. Left-sided pacing device as before. Cardiomegaly with vascular congestion and hazy perihilar edema. Moderate bilateral pleural effusions. Aortic atherosclerosis. No pneumothorax. Bibasilar airspace disease. IMPRESSION: 1. Cardiomegaly with vascular congestion and hazy perihilar edema. 2. Continued moderate pleural effusions with bibasilar consolidations. Electronically Signed   By: Donavan Foil M.D.   On: 04/03/2018 23:56    EKG EKG Interpretation  Date/Time:  Friday April 03 2018 23:24:42 EDT Ventricular Rate:  79 PR Interval:    QRS Duration: 154 QT Interval:  439 QTC Calculation: 504 R Axis:   -108 Text Interpretation:  Normal sinus rhythm Nonspecific IVCD with LAD Consider left ventricular hypertrophy Borderline T abnormalities, lateral leads Confirmed by Randal Buba, Leaira Fullam ([id number]) on 04/04/2018 12:11:36 AM   Radiology Dg Chest 2 View  Result Date: 04/03/2018 CLINICAL DATA:  Chest pain EXAM: CHEST - 2 VIEW COMPARISON:  CT 01/16/2018, radiograph 12/29/2017 FINDINGS: Post sternotomy changes. Left-sided pacing device as before. Cardiomegaly with vascular congestion and hazy perihilar edema. Moderate bilateral pleural effusions. Aortic atherosclerosis. No pneumothorax. Bibasilar  airspace disease. IMPRESSION: 1. Cardiomegaly with vascular congestion and hazy perihilar edema. 2. Continued moderate pleural effusions with bibasilar consolidations. Electronically Signed   By: Donavan Foil M.D.   On: 04/03/2018 23:56    Procedures Procedures (including critical care time)    Final Clinical Impressions(s) / ED Diagnoses   Will admit for CP, symptomatic anemia   Elijha Dedman, MD 04/04/18 0028

## 2018-04-05 ENCOUNTER — Observation Stay (HOSPITAL_COMMUNITY): Payer: Medicare Other

## 2018-04-05 DIAGNOSIS — D649 Anemia, unspecified: Secondary | ICD-10-CM | POA: Diagnosis not present

## 2018-04-05 DIAGNOSIS — Z515 Encounter for palliative care: Secondary | ICD-10-CM | POA: Diagnosis not present

## 2018-04-05 LAB — COMPREHENSIVE METABOLIC PANEL
ALK PHOS: 80 U/L (ref 38–126)
ALT: 35 U/L (ref 0–44)
ANION GAP: 8 (ref 5–15)
AST: 32 U/L (ref 15–41)
Albumin: 2.3 g/dL — ABNORMAL LOW (ref 3.5–5.0)
BILIRUBIN TOTAL: 1.1 mg/dL (ref 0.3–1.2)
BUN: 20 mg/dL (ref 8–23)
CALCIUM: 8.5 mg/dL — AB (ref 8.9–10.3)
CO2: 27 mmol/L (ref 22–32)
CREATININE: 0.6 mg/dL (ref 0.44–1.00)
Chloride: 100 mmol/L (ref 98–111)
GFR calc non Af Amer: >60 mL/min (ref >60)
Glucose, Bld: 83 mg/dL (ref 70–99)
Potassium: 4.4 mmol/L (ref 3.5–5.1)
Sodium: 135 mmol/L (ref 135–145)
TOTAL PROTEIN: 5.4 g/dL — AB (ref 6.5–8.1)

## 2018-04-05 LAB — BPAM RBC
BLOOD PRODUCT EXPIRATION DATE: 201910102359
Blood Product Expiration Date: 201910102359
ISSUE DATE / TIME: 201909210057
ISSUE DATE / TIME: 201909210441
UNIT TYPE AND RH: 6200
Unit Type and Rh: 6200

## 2018-04-05 LAB — TYPE AND SCREEN
ABO/RH(D): A POS
ANTIBODY SCREEN: NEGATIVE
UNIT DIVISION: 0
Unit division: 0

## 2018-04-05 LAB — GLUCOSE, CAPILLARY
GLUCOSE-CAPILLARY: 85 mg/dL (ref 70–99)
GLUCOSE-CAPILLARY: 89 mg/dL (ref 70–99)
GLUCOSE-CAPILLARY: 73 mg/dL (ref 70–99)
Glucose-Capillary: 95 mg/dL (ref 70–99)

## 2018-04-05 LAB — TSH: TSH: 7.029 u[IU]/mL — AB (ref 0.350–4.500)

## 2018-04-05 LAB — CBC WITH DIFFERENTIAL/PLATELET
BASOS ABS: 0.1 10*3/uL (ref 0.0–0.1)
Basophils Relative: 1 %
EOS ABS: 0.1 10*3/uL (ref 0.0–0.7)
Eosinophils Relative: 1 %
HEMATOCRIT: 31.5 % — AB (ref 36.0–46.0)
HEMOGLOBIN: 10.5 g/dL — AB (ref 12.0–15.0)
LYMPHS PCT: 24 %
Lymphs Abs: 1.4 10*3/uL (ref 0.7–4.0)
MCH: 32.1 pg (ref 26.0–34.0)
MCHC: 33.3 g/dL (ref 30.0–36.0)
MCV: 96.3 fL (ref 78.0–100.0)
MONO ABS: 0.4 10*3/uL (ref 0.1–1.0)
Monocytes Relative: 6 %
NEUTROS ABS: 4 10*3/uL (ref 1.7–7.7)
NEUTROS PCT: 68 %
Platelets: 114 10*3/uL — ABNORMAL LOW (ref 150–400)
RBC: 3.27 MIL/uL — ABNORMAL LOW (ref 3.87–5.11)
RDW: 18 % — AB (ref 11.5–15.5)
WBC: 6 10*3/uL (ref 4.0–10.5)

## 2018-04-05 LAB — BASIC METABOLIC PANEL
ANION GAP: 7 (ref 5–15)
BUN: 22 mg/dL (ref 8–23)
CO2: 28 mmol/L (ref 22–32)
Calcium: 8.6 mg/dL — ABNORMAL LOW (ref 8.9–10.3)
Chloride: 100 mmol/L (ref 98–111)
Creatinine, Ser: 0.59 mg/dL (ref 0.44–1.00)
GFR calc non Af Amer: >60 mL/min (ref >60)
Glucose, Bld: 92 mg/dL (ref 70–99)
POTASSIUM: 4.2 mmol/L (ref 3.5–5.1)
SODIUM: 135 mmol/L (ref 135–145)

## 2018-04-05 LAB — CBC
HCT: 30.3 % — ABNORMAL LOW (ref 36.0–46.0)
HEMOGLOBIN: 10.2 g/dL — AB (ref 12.0–15.0)
MCH: 32 pg (ref 26.0–34.0)
MCHC: 33.7 g/dL (ref 30.0–36.0)
MCV: 95 fL (ref 78.0–100.0)
Platelets: 106 10*3/uL — ABNORMAL LOW (ref 150–400)
RBC: 3.19 MIL/uL — AB (ref 3.87–5.11)
RDW: 18 % — ABNORMAL HIGH (ref 11.5–15.5)
WBC: 6.6 10*3/uL (ref 4.0–10.5)

## 2018-04-05 LAB — AMMONIA: AMMONIA: 31 umol/L (ref 9–35)

## 2018-04-05 MED ORDER — LOPERAMIDE HCL 2 MG PO CAPS: 4.0000 mg | ORAL_CAPSULE | ORAL | 0 refills | Status: AC | PRN

## 2018-04-05 MED ORDER — FUROSEMIDE 20 MG PO TABS: 10.0000 mg | ORAL_TABLET | Freq: Every day | ORAL | 0 refills | Status: DC

## 2018-04-05 MED ORDER — DEXTROSE-NACL 5-0.9 % IV SOLN
INTRAVENOUS | Status: DC
  Administered 2018-04-05 – 2018-04-06 (×2): via INTRAVENOUS

## 2018-04-05 NOTE — Progress Notes (Signed)
[facility]                                                                              Patient Demographics  Jean Hodges, is a 84 y.o. female, DOB - 25-Sep-1933, MEQ:[id number]  Admit date - 04/03/2018   Admitting Physician Benito Mccreedy, MD  Outpatient Primary MD for the patient is Patient, No Pcp Per  Outpatient specialists:   LOS - 0  days    Chief Complaint  Patient presents with  . Chest Pain       Brief summary   Jean Hodges is a 84 y.o. female with medical history significant for advanced dementia, severe malnutrition, bedbound status, t2dm, htn, nicm s/p pacemaker, paroxysmal a-fib, chronic anemia thought to be 2/2 myelodysplastic process (has declined w/u), with recent admission (02/2018) for severe anemia thought to be 2/2 myelodysplastic process, presenting via ems for chest pain specific chest pain. Patient noted to be severely anemic without any history of hematochezia or hematemesis nor melena.    Assessment & Plan    Active Problems:   Tonic-clonic seizure disorder ([place])   Moderate protein-calorie malnutrition ([place])   Hypothyroidism (acquired)   Type 2 diabetes mellitus with peripheral vascular disease ([place])   PVD (peripheral vascular disease) ([place])   Essential hypertension   Alzheimer's dementia   Coronary artery disease involving native coronary artery of native heart without angina pectoris   Symptomatic anemia   Macrocytic anemia   Pressure injury of skin   Hx of BKA, right ([place])  # Subacute macrocytic anemia - Recurrently. Likely 2/2 myelodysplastic syndrome (followed by heme, pt has declined w/u). Hemoccult neg. Could be cause of CP, currently does not appear to be in pain. Unfortunately son does not appear to have a full grasp of the severity and terminal nature of the patient's illness.  - 2 u prbcs ordered - am cbc  # chest pain - not currently complaining of pain. Could be 2/2 demand from anemia. Initial trop neg, ekg  non-ischemic. CXR w/o acute changes. Confirmed w/ son that patient is full code. - repeat am trop - tele - am ekg  # seizure disorder - home keppra  # stage 2 sacral decub - wound care  # severe malnutrition - continue home suplement  # hypothyroid - home t4  # htn - home lasix, coreg  # mdd - home sertraline    Code Status: full code DVT Prophylaxis:  heparin Family Communication:   Disposition Plan: currently undergoing  Blastic transformation and anticipate discharge home tomorrow  Time Spent in minutes  32 minutes  Procedures:   Consultants:     Antimicrobials:      Medications  Scheduled Meds: . carvedilol  6.25 mg Oral BID WC  . feeding supplement (ENSURE ENLIVE)  237 mL Oral TID BM  . ferrous sulfate  325 mg Oral BID WC  . furosemide  10 mg Oral Daily  . heparin  5,000 Units Subcutaneous Q8H  . levETIRAcetam  500 mg Oral BID  . levothyroxine  100 mcg Oral QAC breakfast  . magnesium oxide  400 mg Oral Daily  . multivitamin with minerals  1 tablet Oral Daily  . pantoprazole  40 mg Oral Daily  . polyethylene glycol  17 g Oral Daily  . potassium chloride SA  20 mEq Oral Daily  . senna-docusate  2 tablet Oral BID  . sertraline  75 mg Oral Daily  . sodium chloride flush  3 mL Intravenous Q12H   Continuous Infusions: . sodium chloride     PRN Meds:.sodium chloride, loperamide, MUSCLE RUB, ondansetron, sodium chloride flush   Antibiotics   Anti-infectives (From admission, onward)   None        Subjective:   Jean Hodges was seen and examined today.  No chest pain, shortness of breath, currently receiving PRBC transfusion  Objective:   Vitals:   04/04/18 1822 04/05/18 0039 04/05/18 0509 04/05/18 1317  BP: (!) 148/59 (!) 142/57 (!) 147/57 (!) 144/56  Pulse: 78 79 79 78  Resp:  16 16 18   Temp:  98.7 F (37.1 C) 98.5 F (36.9 C) 98.2 F (36.8 C)  TempSrc:  Oral Oral Oral  SpO2:  96% 95% 98%  Weight:   47.2 kg   Height:         Intake/Output Summary (Last 24 hours) at 04/05/2018 1428 Last data filed at 04/05/2018 0900 Gross per 24 hour  Intake 53 ml  Output 200 ml  Net -147 ml     Wt Readings from Last 3 Encounters:  04/05/18 47.2 kg  02/21/18 47.7 kg  01/19/18 49.4 kg     Exam  General: NAD  HEENT: NCAT, (+) scleral pallorL,MMM  Neck: SUPPLE, (-) JVD  Cardiovascular: RRR, (-) GALLOP, (-) MURMUR  Respiratory: CTA  Gastrointestinal: SOFT, (-) DISTENSION, BS(+), (_) TENDERNESS  Ext: (-) CYANOSIS, (-) EDEMA  Neuro: Alert and responsive  Skin:(-) RASH  Psych:NORMAL AFFECT/MOOD   Data Reviewed:  I have personally reviewed following labs and imaging studies  Micro Results Recent Results (from the past 240 hour(s))  MRSA PCR Screening     Status: Abnormal   Collection Time: 04/04/18  3:32 AM  Result Value Ref Range Status   MRSA by PCR POSITIVE (A) NEGATIVE Final    Comment:        The GeneXpert MRSA Assay (FDA approved for NASAL specimens only), is one component of a comprehensive MRSA colonization surveillance program. It is not intended to diagnose MRSA infection nor to guide or monitor treatment for MRSA infections. RESULT CALLED TO, READ BACK BY AND VERIFIED WITH: K. BRACEY @ 9562 ON 04/04/18 BY ROBINSON Z. Performed at [facility], [place] [address] [place], [place] [address]     Radiology Reports Dg Chest 2 View  Result Date: 04/03/2018 CLINICAL DATA:  Chest pain EXAM: CHEST - 2 VIEW COMPARISON:  CT 01/16/2018, radiograph 12/29/2017 FINDINGS: Post sternotomy changes. Left-sided pacing device as before. Cardiomegaly with vascular congestion and hazy perihilar edema. Moderate bilateral pleural effusions. Aortic atherosclerosis. No pneumothorax. Bibasilar airspace disease. IMPRESSION: 1. Cardiomegaly with vascular congestion and hazy perihilar edema. 2. Continued moderate pleural effusions with bibasilar consolidations. Electronically Signed   By: Donavan Foil M.D.    On: 04/03/2018 23:56    Lab Data:  CBC: Recent Labs  Lab 04/03/18 2318 04/04/18 0006 04/04/18 1038 04/05/18 0502  WBC 7.6  --  6.7 6.6  HGB 7.1* 6.8* 11.1* 10.2*  HCT 21.9* 20.0* 33.0* 30.3*  MCV 102.3*  --  95.4 95.0  PLT 150  --  109* 578*   Basic Metabolic Panel: Recent Labs  Lab 04/04/18 0006 04/04/18 1038  04/05/18 0502  NA 132*  --  135  K 4.6  --  4.2  CL 96*  --  100  CO2  --   --  28  GLUCOSE 108*  --  92  BUN 15  --  22  CREATININE 0.40* 0.47 0.59  CALCIUM  --   --  8.6*   GFR: Estimated Creatinine Clearance: 40.4 mL/min (by C-G formula based on SCr of 0.59 mg/dL). Liver Function Tests: No results for input(s): AST, ALT, ALKPHOS, BILITOT, PROT, ALBUMIN in the last 168 hours. No results for input(s): LIPASE, AMYLASE in the last 168 hours. No results for input(s): AMMONIA in the last 168 hours. Coagulation Profile: No results for input(s): INR, PROTIME in the last 168 hours. Cardiac Enzymes: Recent Labs  Lab 04/04/18 1038  TROPONINI <0.03   BNP (last 3 results) No results for input(s): PROBNP in the last 8760 hours. HbA1C: No results for input(s): HGBA1C in the last 72 hours. CBG: Recent Labs  Lab 04/04/18 1138 04/04/18 1637 04/04/18 2044 04/05/18 0750 04/05/18 1204  GLUCAP 100* 131* 174* 85 89   Lipid Profile: No results for input(s): CHOL, HDL, LDLCALC, TRIG, CHOLHDL, LDLDIRECT in the last 72 hours. Thyroid Function Tests: No results for input(s): TSH, T4TOTAL, FREET4, T3FREE, THYROIDAB in the last 72 hours. Anemia Panel: No results for input(s): VITAMINB12, FOLATE, FERRITIN, TIBC, IRON, RETICCTPCT in the last 72 hours. Urine analysis:    Component Value Date/Time   COLORURINE YELLOW 02/20/2018 1827   APPEARANCEUR HAZY (A) 02/20/2018 1827   LABSPEC 1.010 02/20/2018 1827   PHURINE 6.0 02/20/2018 1827   GLUCOSEU NEGATIVE 02/20/2018 1827   HGBUR NEGATIVE 02/20/2018 1827   BILIRUBINUR NEGATIVE 02/20/2018 1827   KETONESUR NEGATIVE  02/20/2018 1827   PROTEINUR NEGATIVE 02/20/2018 1827   NITRITE NEGATIVE 02/20/2018 1827   LEUKOCYTESUR LARGE (A) 02/20/2018 1827     Benito Mccreedy M.D. [facility] 04/05/2018, 2:28 PM  Pager: [phone] Between 7am to 7pm - call Pager - [phone]  After 7pm go to [email or web address] - password [email or web address]  Call night coverage person covering after 7pm

## 2018-04-05 NOTE — Progress Notes (Signed)
Patient  is not taking oral medications, Md aware, Pending CT , IV  Fluids and monitoring labs.

## 2018-04-05 NOTE — Discharge Summary (Deleted)
Jean Hodges, is a 84 y.o. female  DOB May 03, 1934  MRN [id number].  Admission date:  04/03/2018  Admitting Physician  Benito Mccreedy, MD  Discharge Date:  04/05/2018   Primary MD  Patient, No Pcp Per  Recommendations for primary care physician for things to follow:   CBC and BMP   Admission Diagnosis  Other chest pain [R07.89] Symptomatic anemia [D64.9]   Discharge Diagnosis  Other chest pain [R07.89] Symptomatic anemia [D64.9]    Active Problems:   Tonic-clonic seizure disorder (HCC)   Moderate protein-calorie malnutrition (HCC)   Hypothyroidism (acquired)   Type 2 diabetes mellitus with peripheral vascular disease ([place])   PVD (peripheral vascular disease) ([place])   Essential hypertension   Alzheimer's dementia   Coronary artery disease involving native coronary artery of native heart without angina pectoris   Symptomatic anemia   Macrocytic anemia   Pressure injury of skin   Hx of BKA, right ([place])      Past Medical History:  Diagnosis Date  . Acute encephalopathy   . Acute encephalopathy 09/30/2016  . Alzheimer's dementia without behavioral disturbance   . Alzheimer's dementia without behavioral disturbance 10/07/2016  . Anemia   . Anemia 01/28/2014  . Atherosclerotic PVD with ulceration ([place])   . CAD (coronary artery disease)   . CAD (coronary artery disease) 01/04/2014  . Cellulitis   . Cellulitis of left leg   . CHF (congestive heart failure) ([place])   . Diabetes mellitus without complication ([place])   . DM (diabetes mellitus) ([place]) 01/27/2014  . Dysphagia   . HTN (hypertension) 01/04/2014  . Hyperlipidemia   . Hypertension   . Hypoglycemia associated with type 2 diabetes mellitus ([place])   . Macrocytic anemia   . Seizures ([place]) 09/30/2016  . Tear of left rotator cuff   . UTI (urinary tract infection)   . UTI (urinary tract infection) 09/25/2016    Past Surgical History:    Procedure Laterality Date  . BELOW KNEE LEG AMPUTATION Right   . PACEMAKER IMPLANT         HPI  from the history and physical done on the day of admission:    Jean Hodges is a 84 y.o. female with medical history significant for advanced dementia, severe malnutrition, bedbound status, t2dm, htn, nicm s/p pacemaker, paroxysmal a-fib, chronic anemia thought to be 2/2 myelodysplastic process (has declined w/u), with recent admission (02/2018) for severe anemia thought to be 2/2 myelodysplastic process, presenting via ems for chest pain.  History is per patient's son as patient's dementia precludes history taking. Per son patient complained of chest pain to her SNF nurse this evening. Son says mother does have various aches and pains. Son denies hearing report of hematochezia or melena or hematemesis. Denies history MI.   ED Course: 2 u prbcs ordered (h 6.8)     Hospital Course:  On admission she was continued on prbc TRANSFUSION FOR A TOTAL OF 2 UNITS. Cardiac enzymes were negative, and did not have any significant arrythmias. Patient has been on  laxatives which was discontinued due to episodes of loose stools.   She recently demonstrates a stable and back to baseline and is therefore being discharged back to snf.    # Subacute macrocytic anemia - Recurrently. Likely 2/2 myelodysplastic syndrome (followed by heme, pt has declined w/u). Hemoccult neg. Could be cause of CP, currently does not appear to be in pain. Unfortunately son does not appear to have a full grasp of the severity and terminal nature of the patient's illness.  - 2 u prbcs given - discharge Hb 10.2  # chest pain - not currently complaining of pain. Could be 2/2 demand from anemia. Initial trop neg, ekg non-ischemic. CXR w/o acute changes. Confirmed w/ son that patient is full code. - neg trop - tele unremarkable   # seizure disorder - home keppra  # stage 2 sacral decub - wound care  # severe malnutrition -  continue home suplement  # hypothyroid - home t4  # htn - home lasix, coreg  # mdd - home sertraline     Discharge Condition:stable  Follow UP     Consults obtained - n/a  Diet and Activity recommendation:  As advised  Discharge Instructions     Discharge Instructions    Call MD for:  difficulty breathing, headache or visual disturbances   Complete by:  As directed    Call MD for:  extreme fatigue   Complete by:  As directed    Call MD for:  hives   Complete by:  As directed    Call MD for:  persistant dizziness or light-headedness   Complete by:  As directed    Call MD for:  persistant nausea and vomiting   Complete by:  As directed    Call MD for:  redness, tenderness, or signs of infection (pain, swelling, redness, odor or green/yellow discharge around incision site)   Complete by:  As directed    Call MD for:  severe uncontrolled pain   Complete by:  As directed    Call MD for:  temperature >100.4   Complete by:  As directed    Diet - low sodium heart healthy   Complete by:  As directed    Increase activity slowly   Complete by:  As directed         Discharge Medications     Allergies as of 04/05/2018      Reactions   Acetaminophen Other (See Comments)   Unknown   Codeine Nausea Only   Dilaudid [hydromorphone] Nausea Only   Hydrocodone Other (See Comments)   Hallucinations   Keflex [cephalexin] Diarrhea      Medication List    STOP taking these medications   polyethylene glycol packet Commonly known as:  MIRALAX / GLYCOLAX   sennosides-docusate sodium 8.6-50 MG tablet Commonly known as:  SENOKOT-S     TAKE these medications   BIOFREEZE 4 % Gel Generic drug:  Menthol (Topical Analgesic) Apply 1 application topically 2 (two) times daily as needed (pain).   carvedilol 6.25 MG tablet Commonly known as:  COREG Take 6.25 mg by mouth 2 (two) times daily with a meal.   DECUBI-VITE PO Take 1 capsule by mouth daily.   feeding  supplement (ENSURE ENLIVE) Liqd Take 237 mLs by mouth 2 (two) times daily between meals.   ferrous sulfate 325 (65 FE) MG tablet Take 325 mg by mouth 2 (two) times daily with a meal.   furosemide 20 MG tablet Commonly known as:  LASIX Take 0.5 tablets (10 mg total) by mouth daily. Start taking on:  04/06/2018   levETIRAcetam 500 MG tablet Commonly known as:  KEPPRA Take 500 mg by mouth 2 (two) times daily.   levothyroxine 100 MCG tablet Commonly known as:  SYNTHROID, LEVOTHROID Take 100 mcg by mouth daily before breakfast.   loperamide 2 MG capsule Commonly known as:  IMODIUM Take 2 capsules (4 mg total) by mouth as needed for diarrhea or loose stools.   magnesium oxide 400 MG tablet Commonly known as:  MAG-OX Take 400 mg by mouth daily.   ondansetron 4 MG tablet Commonly known as:  ZOFRAN Take 4 mg by mouth every 6 (six) hours as needed for nausea or vomiting.   pantoprazole 40 MG tablet Commonly known as:  PROTONIX Take 1 tablet (40 mg total) by mouth daily.   potassium chloride SA 20 MEQ tablet Commonly known as:  K-DUR,KLOR-CON Take 20 mEq by mouth daily.   sertraline 25 MG tablet Commonly known as:  ZOLOFT Take 75 mg by mouth daily.       Major procedures and Radiology Reports - PLEASE review detailed and final reports for all details, in brief -      Dg Chest 2 View  Result Date: 04/03/2018 CLINICAL DATA:  Chest pain EXAM: CHEST - 2 VIEW COMPARISON:  CT 01/16/2018, radiograph 12/29/2017 FINDINGS: Post sternotomy changes. Left-sided pacing device as before. Cardiomegaly with vascular congestion and hazy perihilar edema. Moderate bilateral pleural effusions. Aortic atherosclerosis. No pneumothorax. Bibasilar airspace disease. IMPRESSION: 1. Cardiomegaly with vascular congestion and hazy perihilar edema. 2. Continued moderate pleural effusions with bibasilar consolidations. Electronically Signed   By: Donavan Foil M.D.   On: 04/03/2018 23:56    Micro Results     Recent Results (from the past 240 hour(s))  MRSA PCR Screening     Status: Abnormal   Collection Time: 04/04/18  3:32 AM  Result Value Ref Range Status   MRSA by PCR POSITIVE (A) NEGATIVE Final    Comment:        The GeneXpert MRSA Assay (FDA approved for NASAL specimens only), is one component of a comprehensive MRSA colonization surveillance program. It is not intended to diagnose MRSA infection nor to guide or monitor treatment for MRSA infections. RESULT CALLED TO, READ BACK BY AND VERIFIED WITH: K. BRACEY @ 8101 ON 04/04/18 BY ROBINSON Z. Performed at [facility], [place] [address], [place], [place] [address]        Today   Subjective    Jean Hodges today has no fever or chills         Patient has been seen and examined prior to discharge   Objective   Blood pressure (!) 147/57, pulse 79, temperature 98.5 F (36.9 C), temperature source Oral, resp. rate 16, height 5\' 2"  (1.575 m), weight 47.2 kg, SpO2 95 %.   Intake/Output Summary (Last 24 hours) at 04/05/2018 1149 Last data filed at 04/05/2018 0558 Gross per 24 hour  Intake 53 ml  Output 200 ml  Net -147 ml    Exam  General: NAD  HEENT: NCAT, (+) scleral pallorL,MMM  Neck: SUPPLE, (-) JVD  Cardiovascular: RRR, (-) GALLOP, (-) MURMUR  Respiratory: CTA  Gastrointestinal: SOFT, (-) DISTENSION, BS(+), (_) TENDERNESS  Ext: (-) CYANOSIS, (-) EDEMA  Neuro: A, OX 3  Skin:(-) RASH  Psych:NORMAL AFFECT/MOOD    Data Review   CBC w Diff:  Lab Results  Component Value Date   WBC 6.6  04/05/2018   HGB 10.2 (L) 04/05/2018   HCT 30.3 (L) 04/05/2018   PLT 106 (L) 04/05/2018   LYMPHOPCT 24 01/16/2018   MONOPCT 6 01/16/2018   EOSPCT 3 01/16/2018   BASOPCT 0 01/16/2018    CMP:  Lab Results  Component Value Date   NA 135 04/05/2018   NA 143 10/15/2016   K 4.2 04/05/2018   CL 100 04/05/2018   CO2 28 04/05/2018   BUN 22 04/05/2018   BUN 15 10/15/2016   CREATININE 0.59 04/05/2018    GLU 65 10/15/2016   PROT 5.4 (L) 02/20/2018   ALBUMIN 2.4 (L) 02/20/2018   BILITOT 0.5 02/20/2018   ALKPHOS 76 02/20/2018   AST 28 02/20/2018   ALT 30 02/20/2018  .   Total Discharge time is about 33 minutes  Benito Mccreedy M.D on 04/05/2018 at 11:49 AM  [facility]   Office  [phone]  Dragon dictation system was used to create this note, attempts have been made to correct errors, however presence of uncorrected errors is not a reflection quality of care provided

## 2018-04-05 NOTE — Progress Notes (Signed)
Clinical Social Worker facilitated patient discharge including contacting patient family and facility to confirm patient discharge plans.  Clinical information faxed to facility and family agreeable with plan. CSW arranged ambulance transport via [facility] to [facility]. RN to call for report prior to [phone]  Clinical Social Worker will sign off for now as social work intervention is no longer needed. Please consult [place] again if new need arises.  [place] , [place]

## 2018-04-05 NOTE — Progress Notes (Signed)
Patient refused oral medications.  CT completed. IV fluids started.

## 2018-04-05 NOTE — Progress Notes (Signed)
[facility] arrived to transport pt to [facility] for discharge.  PTAR stated that patient was not responding to sternal rub.  This RN went into room to assess pt and pt would moan to sternal rub and nod appropriately to questions, but would not speak or open eyes.  VS stable.  Pt has not been awake/alert today, but would answer questions and speak to this RN.  Would not take medications, however.  MD notified and stated to cancel discharge and gave orders to get a head CT and CBG.  Orders carried out and will continue to monitor.  [place]

## 2018-04-05 NOTE — Progress Notes (Signed)
Pt re-evealuated due to unresponsiveness. She was sleeping earlier this morning but was arousable, and able to talk to me earlier. However nurse called me back bcos [facility] could not arouse her. She is currently arousable at bedside, but lethargic. Nurse tells me she has not had any sedative hypnotuc. Check UA, Head CT and routine labs. Hold Discharge for now

## 2018-04-06 DIAGNOSIS — I2510 Atherosclerotic heart disease of native coronary artery without angina pectoris: Secondary | ICD-10-CM | POA: Diagnosis not present

## 2018-04-06 DIAGNOSIS — Z515 Encounter for palliative care: Secondary | ICD-10-CM | POA: Diagnosis not present

## 2018-04-06 DIAGNOSIS — I10 Essential (primary) hypertension: Secondary | ICD-10-CM | POA: Diagnosis not present

## 2018-04-06 DIAGNOSIS — G309 Alzheimer's disease, unspecified: Secondary | ICD-10-CM | POA: Diagnosis not present

## 2018-04-06 DIAGNOSIS — Z89511 Acquired absence of right leg below knee: Secondary | ICD-10-CM | POA: Diagnosis not present

## 2018-04-06 LAB — GLUCOSE, CAPILLARY
Glucose-Capillary: 175 mg/dL — ABNORMAL HIGH (ref 70–99)
Glucose-Capillary: 195 mg/dL — ABNORMAL HIGH (ref 70–99)

## 2018-04-06 MED ORDER — CHLORHEXIDINE GLUCONATE CLOTH 2 % EX PADS
6.0000 | MEDICATED_PAD | Freq: Every day | CUTANEOUS | Status: DC
  Administered 2018-04-06: 6 via TOPICAL

## 2018-04-06 MED ORDER — MUPIROCIN 2 % EX OINT
TOPICAL_OINTMENT | Freq: Two times a day (BID) | CUTANEOUS | Status: DC
  Administered 2018-04-06: 11:00:00 via NASAL
  Filled 2018-04-06: qty 22

## 2018-04-06 NOTE — Clinical Social Work Placement (Signed)
   CLINICAL SOCIAL WORK PLACEMENT  NOTE [name] RN to call report to [phone]  Date:  04/06/2018  Patient Details  Name: Jean Hodges MRN: [id number] Date of Birth: Feb 07, 1934  Clinical Social Work is seeking post-discharge placement for this patient at the [place] level of care (*CSW will initial, date and re-position this form in  chart as items are completed):  No   Patient/family provided with [facility]'s list of facilities offering this level of care within the geographic area requested by the patient (or if unable, by the patient's family).  Yes   Patient/family informed of their freedom to choose among providers that offer the needed level of care, that participate in Medicare, Medicaid or managed care program needed by the patient, have an available bed and are willing to accept the patient.  No   Patient/family informed of [facility]'s ownership interest in [facility] and [facility], as well as of the fact that they are under no obligation to receive care at these facilities.  PASRR submitted to EDS on       PASRR number received on       Existing PASRR number confirmed on       FL2 transmitted to all facilities in geographic area requested by pt/family on 04/06/18     FL2 transmitted to all facilities within larger geographic area on       Patient informed that his/her managed care company has contracts with or will negotiate with certain facilities, including the following:        Yes   Patient/family informed of bed offers received.  Patient chooses bed at [facility]     Physician recommends and patient chooses bed at      Patient to be transferred to [facility] on 04/06/18.  Patient to be transferred to facility by [facility]     Patient family notified on 04/06/18 of transfer.  Name of family member notified:  pt son Richard     PHYSICIAN       Additional Comment:     _______________________________________________ [place], [place] 04/06/2018, 12:00 PM

## 2018-04-06 NOTE — Discharge Summary (Signed)
Physician Discharge Summary  Reyes Fifield BOF:[id number] DOB: 01-10-1934 DOA: 04/03/2018  PCP: Patient, No Pcp Per  Admit date: 04/03/2018 Discharge date: 04/06/2018  Admitted From: [facility] Disposition: [facility] with Palliative Care   Recommendations for Outpatient Follow-up:  1. Please call Hospice and [facility] to schedule follow up visit    Home Health: N/A  Equipment/Devices: None  Discharge Condition: Poor, end-stage dementia CODE STATUS: FULL Diet recommendation: General  Brief/Interim Summary: Mrs. Way is an 84 y.o. F with advanced end-stage dementia, cachexia, myelodysplastic syndrome now transfusion dependent, seizure disorder, HTN, and right BKA who presented with chest pain, found to have severe anemia.  Per H&P, the patient had complained to nursing staff of chest pain, but was not able to reliably reitererate to providers here.  Nonetheless, she was noted to have Hgb < 7 and so was admitted for transfusion.        Discharge Diagnoses:   Subacute macrocytic anemia Presumably due to MDS.  Transfused 2 units, Hgb subsequently stable.  No clinical signs of GI blood loss.  Chest pain No further complaints of chest pain.  Troponins serially negative, ECG normal.  Seizures Home Keppra continued.  Stage II Sacral decubitus ulcer  Severe protein calorie malnutrition/Cachexia Advanced dementia Patient is followed by Palliative Care division of [facility].  Recommend they re-establish.  I had long discussion with son, regarding the natural progression of dementia, including weight loss, loss of communication and ambulatory function (complicated here by BKA), pocketing food.  We discussed in particular that likely she will not gain weight, and that pivoting from "fixing problems" to supportive transition to end-of-life may become more and more the best way to support the patient.  Hypothyroidism  Hypertension  Unresponsive episode Patient was  discharged yesterday, but on arrival of [facility] was less responsive.  Her BP, HR, temp, SpO2, and respiratory rate remained normal.  CBC and BMP and ammonia were normal.  TSH slightly high, but not remarkably so.  CT head unremarkable.  By today, she has completely returned to baseline.  Infection doubted, seizure possible but doubted.  The patient is mostly nonverbal and bedbound at baseline.        Discharge Instructions  Discharge Instructions    Call MD for:  difficulty breathing, headache or visual disturbances   Complete by:  As directed    Call MD for:  extreme fatigue   Complete by:  As directed    Call MD for:  hives   Complete by:  As directed    Call MD for:  persistant dizziness or light-headedness   Complete by:  As directed    Call MD for:  persistant nausea and vomiting   Complete by:  As directed    Call MD for:  redness, tenderness, or signs of infection (pain, swelling, redness, odor or green/yellow discharge around incision site)   Complete by:  As directed    Call MD for:  severe uncontrolled pain   Complete by:  As directed    Call MD for:  temperature >100.4   Complete by:  As directed    Diet - low sodium heart healthy   Complete by:  As directed    Increase activity slowly   Complete by:  As directed      Allergies as of 04/06/2018      Reactions   Acetaminophen Other (See Comments)   Unknown   Codeine Nausea Only   Dilaudid [hydromorphone] Nausea Only   Hydrocodone  Other (See Comments)   Hallucinations   Keflex [cephalexin] Diarrhea      Medication List    STOP taking these medications   polyethylene glycol packet Commonly known as:  MIRALAX / GLYCOLAX   sennosides-docusate sodium 8.6-50 MG tablet Commonly known as:  SENOKOT-S     TAKE these medications   BIOFREEZE 4 % Gel Generic drug:  Menthol (Topical Analgesic) Apply 1 application topically 2 (two) times daily as needed (pain).   carvedilol 6.25 MG tablet Commonly known as:   COREG Take 6.25 mg by mouth 2 (two) times daily with a meal.   DECUBI-VITE PO Take 1 capsule by mouth daily.   feeding supplement (ENSURE ENLIVE) Liqd Take 237 mLs by mouth 2 (two) times daily between meals.   ferrous sulfate 325 (65 FE) MG tablet Take 325 mg by mouth 2 (two) times daily with a meal.   furosemide 20 MG tablet Commonly known as:  LASIX Take 0.5 tablets (10 mg total) by mouth daily.   levETIRAcetam 500 MG tablet Commonly known as:  KEPPRA Take 500 mg by mouth 2 (two) times daily.   levothyroxine 100 MCG tablet Commonly known as:  SYNTHROID, LEVOTHROID Take 100 mcg by mouth daily before breakfast.   loperamide 2 MG capsule Commonly known as:  IMODIUM Take 2 capsules (4 mg total) by mouth as needed for diarrhea or loose stools.   magnesium oxide 400 MG tablet Commonly known as:  MAG-OX Take 400 mg by mouth daily.   ondansetron 4 MG tablet Commonly known as:  ZOFRAN Take 4 mg by mouth every 6 (six) hours as needed for nausea or vomiting.   pantoprazole 40 MG tablet Commonly known as:  PROTONIX Take 1 tablet (40 mg total) by mouth daily.   potassium chloride SA 20 MEQ tablet Commonly known as:  K-DUR,KLOR-CON Take 20 mEq by mouth daily.   sertraline 25 MG tablet Commonly known as:  ZOLOFT Take 75 mg by mouth daily.       Allergies  Allergen Reactions  . Acetaminophen Other (See Comments)    Unknown  . Codeine Nausea Only  . Dilaudid [Hydromorphone] Nausea Only  . Hydrocodone Other (See Comments)    Hallucinations  . Keflex [Cephalexin] Diarrhea    Consultations:  None   Procedures/Studies: Dg Chest 2 View  Result Date: 04/03/2018 CLINICAL DATA:  Chest pain EXAM: CHEST - 2 VIEW COMPARISON:  CT 01/16/2018, radiograph 12/29/2017 FINDINGS: Post sternotomy changes. Left-sided pacing device as before. Cardiomegaly with vascular congestion and hazy perihilar edema. Moderate bilateral pleural effusions. Aortic atherosclerosis. No pneumothorax.  Bibasilar airspace disease. IMPRESSION: 1. Cardiomegaly with vascular congestion and hazy perihilar edema. 2. Continued moderate pleural effusions with bibasilar consolidations. Electronically Signed   By: Donavan Foil M.D.   On: 04/03/2018 23:56   Ct Head Wo Contrast  Result Date: 04/05/2018 CLINICAL DATA:  Pt was unresponsive this am but is currently somewhat responsive, she cried out when moved from bed to table but lethargic EXAM: CT HEAD WITHOUT CONTRAST TECHNIQUE: Contiguous axial images were obtained from the base of the skull through the vertex without intravenous contrast. COMPARISON:  12/29/2017 FINDINGS: Brain: No evidence of acute infarction, hemorrhage, hydrocephalus, extra-axial collection or mass lesion/mass effect. There is ventricular and sulcal enlargement reflecting mild diffuse atrophy. 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 Patchy white matter hypoattenuation is noted consistent with mild chronic microvascular ischemic change. Vascular: No hyperdense vessel or unexpected calcification. Skull: Normal. Negative for fracture or focal lesion. Sinuses/Orbits: Globes and orbits  are unremarkable. Visualized sinuses and mastoid air cells are clear. Other: None. IMPRESSION: 1. No acute intracranial abnormalities. 2. Atrophy, chronic microvascular ischemic change and old right PCA distribution infarct, stable compared to the prior study. Electronically Signed   By: Lajean Manes M.D.   On: 04/05/2018 15:13      Subjective: Unable to assess due to patient mentation.  Per nursing, she pockets pills.  She interacts with one word answers.  She has had no fever or complaints of chest or abdominal pain.  Discharge Exam: Vitals:   04/06/18 0410 04/06/18 1110  BP: (!) 136/51 (!) 145/52  Pulse: 78 79  Resp: 16 18  Temp: 98.6 F (37 C)   SpO2: 97%    Vitals:   04/05/18 1734 04/05/18 2130 04/06/18 0410 04/06/18 1110  BP: (!) 148/56 (!) 148/61 (!) 136/51 (!) 145/52  Pulse: 78 79 78 79  Resp:   18 16 18   Temp:  98.3 F (36.8 C) 98.6 F (37 C)   TempSrc:  Oral Oral   SpO2: 96% 98% 97%   Weight:   46.6 kg   Height:        General: Pt is alert, Smiles when I enter, makes eye contact, no acute distress Cardiovascular: RRR, S1/S2 +, no rubs, no gallops Respiratory: CTA bilaterally, no wheezing, no rhonchi Abdominal: Soft, NT, ND, bowel sounds + Extremities: no edema, no cyanosis, cachectic Neuro: She makes eye contact, she is globally weak, she makes one word answers, she does not follow commands    The results of significant diagnostics from this hospitalization (including imaging, microbiology, ancillary and laboratory) are listed below for reference.     Microbiology: Recent Results (from the past 240 hour(s))  MRSA PCR Screening     Status: Abnormal   Collection Time: 04/04/18  3:32 AM  Result Value Ref Range Status   MRSA by PCR POSITIVE (A) NEGATIVE Final    Comment:        The GeneXpert MRSA Assay (FDA approved for NASAL specimens only), is one component of a comprehensive MRSA colonization surveillance program. It is not intended to diagnose MRSA infection nor to guide or monitor treatment for MRSA infections. RESULT CALLED TO, READ BACK BY AND VERIFIED WITH: K. BRACEY @ 7829 ON 04/04/18 BY ROBINSON Z. Performed at [facility], [place] [address], [place],  [address]      Labs: BNP (last 3 results) No results for input(s): BNP in the last 8760 hours. Basic Metabolic Panel: Recent Labs  Lab 04/04/18 0006 04/04/18 1038 04/05/18 0502 04/05/18 1539  NA 132*  --  135 135  K 4.6  --  4.2 4.4  CL 96*  --  100 100  CO2  --   --  28 27  GLUCOSE 108*  --  92 83  BUN 15  --  22 20  CREATININE 0.40* 0.47 0.59 0.60  CALCIUM  --   --  8.6* 8.5*   Liver Function Tests: Recent Labs  Lab 04/05/18 1539  AST 32  ALT 35  ALKPHOS 80  BILITOT 1.1  PROT 5.4*  ALBUMIN 2.3*   No results for input(s): LIPASE, AMYLASE in the last 168 hours. Recent  Labs  Lab 04/05/18 1539  AMMONIA 31   CBC: Recent Labs  Lab 04/03/18 2318 04/04/18 0006 04/04/18 1038 04/05/18 0502 04/05/18 1539  WBC 7.6  --  6.7 6.6 6.0  NEUTROABS  --   --   --   --  4.0  HGB 7.1* 6.8* 11.1* 10.2* 10.5*  HCT 21.9* 20.0* 33.0* 30.3* 31.5*  MCV 102.3*  --  95.4 95.0 96.3  PLT 150  --  109* 106* 114*   Cardiac Enzymes: Recent Labs  Lab 04/04/18 1038  TROPONINI <0.03   BNP: Invalid input(s): POCBNP CBG: Recent Labs  Lab 04/05/18 1204 04/05/18 1645 04/05/18 2126 04/06/18 0738 04/06/18 1143  GLUCAP 89 73 95 175* 195*   D-Dimer No results for input(s): DDIMER in the last 72 hours. Hgb A1c No results for input(s): HGBA1C in the last 72 hours. Lipid Profile No results for input(s): CHOL, HDL, LDLCALC, TRIG, CHOLHDL, LDLDIRECT in the last 72 hours. Thyroid function studies Recent Labs    04/05/18 1539  TSH 7.029*   Anemia work up No results for input(s): VITAMINB12, FOLATE, FERRITIN, TIBC, IRON, RETICCTPCT in the last 72 hours. Urinalysis    Component Value Date/Time   COLORURINE YELLOW 02/20/2018 1827   APPEARANCEUR HAZY (A) 02/20/2018 1827   LABSPEC 1.010 02/20/2018 1827   PHURINE 6.0 02/20/2018 1827   GLUCOSEU NEGATIVE 02/20/2018 1827   HGBUR NEGATIVE 02/20/2018 1827   BILIRUBINUR NEGATIVE 02/20/2018 1827   KETONESUR NEGATIVE 02/20/2018 1827   PROTEINUR NEGATIVE 02/20/2018 1827   NITRITE NEGATIVE 02/20/2018 1827   LEUKOCYTESUR LARGE (A) 02/20/2018 1827   Sepsis Labs Invalid input(s): PROCALCITONIN,  WBC,  LACTICIDVEN Microbiology Recent Results (from the past 240 hour(s))  MRSA PCR Screening     Status: Abnormal   Collection Time: 04/04/18  3:32 AM  Result Value Ref Range Status   MRSA by PCR POSITIVE (A) NEGATIVE Final    Comment:        The GeneXpert MRSA Assay (FDA approved for NASAL specimens only), is one component of a comprehensive MRSA colonization surveillance program. It is not intended to diagnose  MRSA infection nor to guide or monitor treatment for MRSA infections. RESULT CALLED TO, READ BACK BY AND VERIFIED WITH: K. BRACEY @ 9371 ON 04/04/18 BY ROBINSON Z. Performed at [facility], [place] [address] [place], [place] [address]      Time coordinating discharge: 40 minutes       SIGNED:   Edwin Dada, MD  [facility] 04/06/2018, 11:53 AM

## 2018-04-06 NOTE — Social Work (Signed)
Clinical Social Worker facilitated patient discharge including contacting patient family and facility to confirm patient discharge plans.  Clinical information faxed to facility and family agreeable with plan.  CSW arranged ambulance transport via [facility] to [facility].  RN to call [phone] with report prior to discharge.  Clinical Social Worker will sign off for now as social work intervention is no longer needed. Please consult [place] again if new need arises.  Alexander Mt, [place] Social Worker [phone]

## 2018-04-06 NOTE — Progress Notes (Signed)
Patient d/c with [facility] to [facility].  Report given to [facility] staff along with pt's blanket and paperwork.  Telemetry D/C. IV d/c by NT.

## 2018-04-06 NOTE — Progress Notes (Signed)
Contacted Social worker about patient having d/c order.  Cleota Pellerito, RN

## 2018-04-06 NOTE — Social Work (Addendum)
CSW acknowledging discharge order. Have contacted [facility] where CSW is aware pt is LTC resident. Pt will need d/c summary and then CSW will arrange transportation.   11:56am- d/c summary received, no FL2 required, will call and inform pt son of discharge.   Alexander Mt, [place] Work [phone]

## 2018-04-06 NOTE — Progress Notes (Signed)
Report called to [place], [place], RN

## 2018-04-08 ENCOUNTER — Non-Acute Institutional Stay: Payer: Medicare Other | Admitting: Internal Medicine

## 2018-04-08 DIAGNOSIS — R413 Other amnesia: Secondary | ICD-10-CM

## 2018-04-08 DIAGNOSIS — E43 Unspecified severe protein-calorie malnutrition: Secondary | ICD-10-CM

## 2018-04-08 DIAGNOSIS — Z515 Encounter for palliative care: Secondary | ICD-10-CM

## 2018-05-05 NOTE — Progress Notes (Signed)
PALLIATIVE CARE CONSULT VISIT   PATIENT NAME: Jean Hodges DOB: 10-28-1933 MRN: [id number]     REFERRING PROVIDER:      Dr. Theodis Blaze, PA  RESPONSIBLE PARTY:   Darden Dates)  [phone], Gus Height) [phone]   RECOMMENDATIONS and PLAN:  1.Severe protein-calorie malnutrition:  No improvement.  Varied intake including nutritional supplements.  Comfort foods prn.   No feeding tube placement at this time per son.    Hospice appropriate due to very poor state and quality of health.  2. Memory Loss R41.3:  FAST 7c.  Progressive cognitive and functional decline related to advanced dementia. No expectation of improvement.  3. Palliative care encounter  Z51.5:   Patient remains a full code and full scope of treatment per request of son Delfino Lovett who was contacted via phone.  Updated son related to patient's current health and trajectory which has been explained numerous times.  Recommendations for transition to Hospice services were also given but refused at this time.  Continue supportive care and notify family when additional status changes occur.  Palliative care will continue to follow.    I spent 30 minutes providing this consultation at [facility],  from 3:00pm to 3:30pm at [facility]. More than 50% of the time in this consultation was spent coordinating communication with [place], facility staff and son.   HISTORY OF PRESENT ILLNESS: Follow-up with [name] who remains in a chronic decline of health as related to advanced dementia and protein calorie malnutrition. She received a blood transfusion x 2units during recent hospitalization from 9/20-9/23/19 . She has a stage 3-4 sacral wound per wound care nurse.  She requires total care in all aspects and is incontinent of B&B.  Staff reports decreased nutritional intake but is receiving protein supplements.  CODE STATUS: FULL CODE  PPS: 30% (bed bound)  HOSPICE ELIGIBILITY/DIAGNOSIS: YES:  Severe  protein calorie malnutrition, advanced dementia.  Declined by family  PAST MEDICAL HISTORY:  Past Medical History:  Diagnosis Date  . Acute encephalopathy   . Acute encephalopathy 09/30/2016  . Alzheimer's dementia without behavioral disturbance   . Alzheimer's dementia without behavioral disturbance 10/07/2016  . Anemia   . Anemia 01/28/2014  . Atherosclerotic PVD with ulceration ([place])   . CAD (coronary artery disease)   . CAD (coronary artery disease) 01/04/2014  . Cellulitis   . Cellulitis of left leg   . Diabetes mellitus without complication ([place])   . DM (diabetes mellitus) ([place]) 01/27/2014  . HTN (hypertension) 01/04/2014  . Hyperlipidemia   . Hypertension   . Hypoglycemia associated with type 2 diabetes mellitus ([place])   . Macrocytic anemia   . Seizures ([place]) 09/30/2016  . Tear of left rotator cuff   . UTI (urinary tract infection)   . UTI (urinary tract infection) 09/25/2016    SOCIAL HX:  Social History   Tobacco Use  . Smoking status: Never Smoker  . Smokeless tobacco: Never Used  Substance Use Topics  . Alcohol use: No    ALLERGIES:  Allergies  Allergen Reactions  . Acetaminophen   . Codeine Nausea Only  . Dilaudid [Hydromorphone]   . Hydrocodone     Hallucinations  . Keflex [Cephalexin] Diarrhea     PERTINENT MEDICATIONS:  Outpatient Encounter Medications as of 12/01/2017  Medication Sig  . carvedilol (COREG) 12.5 MG tablet Take 12.5 mg by mouth 2 (two) times daily with a meal. Hold for SBP<110 and/or pulse <60/min  . Cholecalciferol (VITAMIN D3) 2000 units TABS Take  2,000 Units by mouth daily.  . feeding supplement, ENSURE ENLIVE, (ENSURE ENLIVE) LIQD Take 237 mLs by mouth 3 (three) times daily between meals. (Patient not taking: Reported on 11/03/2017)  . ferrous sulfate 325 (65 FE) MG tablet Take 325 mg by mouth 2 (two) times daily with a meal.  . furosemide (LASIX) 20 MG tablet Take 1 tablet (20 mg total) by mouth daily.  Marland Kitchen levETIRAcetam (KEPPRA)  500 MG tablet Take 500 mg by mouth 2 (two) times daily.  Marland Kitchen levothyroxine (SYNTHROID, LEVOTHROID) 100 MCG tablet Take 100 mcg by mouth daily before breakfast.  . mirtazapine (REMERON) 15 MG tablet Take 15 mg by mouth at bedtime.  . Multiple Vitamins-Minerals (DECUBI-VITE PO) Take 1 capsule by mouth daily.  Marland Kitchen nystatin (MYCOSTATIN) 100000 UNIT/ML suspension Take 5 mLs (500,000 Units total) by mouth 4 (four) times daily.  . ondansetron (ZOFRAN) 4 MG tablet Take 4 mg by mouth every 6 (six) hours as needed for nausea or vomiting.  . pantoprazole (PROTONIX) 40 MG tablet Take 1 tablet (40 mg total) by mouth daily.  . polyethylene glycol (MIRALAX / GLYCOLAX) packet Take 17 g by mouth daily.  . sennosides-docusate sodium (SENOKOT-S) 8.6-50 MG tablet Take 2 tablets by mouth 2 (two) times daily.  . sertraline (ZOLOFT) 50 MG tablet Take 75 mg by mouth daily.   No facility-administered encounter medications on file as of 12/01/2017.     PHYSICAL EXAM:   General: Remains cachectic, frail and fragile appearing elderly female resting in bed.  In NAD Cardiovascular: regular rate and rhythm Pulmonary: Clear anterior lung fields.  Unlabored respirations.  Skeletal structure is prominent Abdomen: soft, nontender, + bowel sounds Extremities: R AKA.  No ecchymosis or edema.  Muscular atrophy Skin:  Very pale in color.  Reports of sacral decub but not visualized Neurological: Somnolent and aroused easily.  Oriented to name only.  Confused to all remaining details.  Does not follow commands      Gonzella Lex, NP-C

## 2018-05-14 ENCOUNTER — Other Ambulatory Visit: Payer: Self-pay

## 2018-05-14 ENCOUNTER — Observation Stay (HOSPITAL_COMMUNITY)
Admission: EM | Admit: 2018-05-14 | Discharge: 2018-05-15 | Disposition: A | Discharge status: Skilled Nursing Facility | Payer: Medicare Other | Attending: Internal Medicine | Admitting: Internal Medicine

## 2018-05-14 ENCOUNTER — Encounter (HOSPITAL_COMMUNITY): Payer: Self-pay

## 2018-05-14 DIAGNOSIS — L89899 Pressure ulcer of other site, unspecified stage: Secondary | ICD-10-CM

## 2018-05-14 DIAGNOSIS — R627 Adult failure to thrive: Secondary | ICD-10-CM | POA: Diagnosis present

## 2018-05-14 DIAGNOSIS — Z885 Allergy status to narcotic agent status: Secondary | ICD-10-CM | POA: Diagnosis not present

## 2018-05-14 DIAGNOSIS — I5022 Chronic systolic (congestive) heart failure: Secondary | ICD-10-CM | POA: Insufficient documentation

## 2018-05-14 DIAGNOSIS — D649 Anemia, unspecified: Secondary | ICD-10-CM | POA: Diagnosis present

## 2018-05-14 DIAGNOSIS — Z881 Allergy status to other antibiotic agents status: Secondary | ICD-10-CM | POA: Diagnosis not present

## 2018-05-14 DIAGNOSIS — Z89511 Acquired absence of right leg below knee: Secondary | ICD-10-CM | POA: Diagnosis not present

## 2018-05-14 DIAGNOSIS — Z681 Body mass index (BMI) 19 or less, adult: Secondary | ICD-10-CM | POA: Insufficient documentation

## 2018-05-14 DIAGNOSIS — D464 Refractory anemia, unspecified: Secondary | ICD-10-CM | POA: Diagnosis present

## 2018-05-14 DIAGNOSIS — G40409 Other generalized epilepsy and epileptic syndromes, not intractable, without status epilepticus: Secondary | ICD-10-CM | POA: Diagnosis present

## 2018-05-14 DIAGNOSIS — L89150 Pressure ulcer of sacral region, unstageable: Secondary | ICD-10-CM | POA: Diagnosis not present

## 2018-05-14 DIAGNOSIS — I2510 Atherosclerotic heart disease of native coronary artery without angina pectoris: Secondary | ICD-10-CM | POA: Insufficient documentation

## 2018-05-14 DIAGNOSIS — F329 Major depressive disorder, single episode, unspecified: Secondary | ICD-10-CM | POA: Diagnosis not present

## 2018-05-14 DIAGNOSIS — D469 Myelodysplastic syndrome, unspecified: Principal | ICD-10-CM | POA: Insufficient documentation

## 2018-05-14 DIAGNOSIS — E43 Unspecified severe protein-calorie malnutrition: Secondary | ICD-10-CM | POA: Diagnosis not present

## 2018-05-14 DIAGNOSIS — G40909 Epilepsy, unspecified, not intractable, without status epilepticus: Secondary | ICD-10-CM

## 2018-05-14 DIAGNOSIS — G309 Alzheimer's disease, unspecified: Secondary | ICD-10-CM | POA: Insufficient documentation

## 2018-05-14 DIAGNOSIS — X58XXXA Exposure to other specified factors, initial encounter: Secondary | ICD-10-CM | POA: Diagnosis not present

## 2018-05-14 DIAGNOSIS — E1151 Type 2 diabetes mellitus with diabetic peripheral angiopathy without gangrene: Secondary | ICD-10-CM | POA: Diagnosis not present

## 2018-05-14 DIAGNOSIS — Z950 Presence of cardiac pacemaker: Secondary | ICD-10-CM | POA: Diagnosis not present

## 2018-05-14 DIAGNOSIS — I110 Hypertensive heart disease with heart failure: Secondary | ICD-10-CM | POA: Diagnosis not present

## 2018-05-14 DIAGNOSIS — Z79890 Hormone replacement therapy (postmenopausal): Secondary | ICD-10-CM | POA: Insufficient documentation

## 2018-05-14 DIAGNOSIS — E039 Hypothyroidism, unspecified: Secondary | ICD-10-CM | POA: Insufficient documentation

## 2018-05-14 DIAGNOSIS — F0280 Dementia in other diseases classified elsewhere without behavioral disturbance: Secondary | ICD-10-CM | POA: Insufficient documentation

## 2018-05-14 DIAGNOSIS — L89890 Pressure ulcer of other site, unstageable: Secondary | ICD-10-CM | POA: Insufficient documentation

## 2018-05-14 DIAGNOSIS — F32A Depression, unspecified: Secondary | ICD-10-CM

## 2018-05-14 DIAGNOSIS — D638 Anemia in other chronic diseases classified elsewhere: Secondary | ICD-10-CM | POA: Diagnosis not present

## 2018-05-14 DIAGNOSIS — T8789 Other complications of amputation stump: Secondary | ICD-10-CM | POA: Diagnosis not present

## 2018-05-14 DIAGNOSIS — G8929 Other chronic pain: Secondary | ICD-10-CM

## 2018-05-14 DIAGNOSIS — E119 Type 2 diabetes mellitus without complications: Secondary | ICD-10-CM

## 2018-05-14 DIAGNOSIS — K219 Gastro-esophageal reflux disease without esophagitis: Secondary | ICD-10-CM | POA: Diagnosis not present

## 2018-05-14 DIAGNOSIS — I10 Essential (primary) hypertension: Secondary | ICD-10-CM

## 2018-05-14 DIAGNOSIS — Z79899 Other long term (current) drug therapy: Secondary | ICD-10-CM | POA: Diagnosis not present

## 2018-05-14 LAB — COMPREHENSIVE METABOLIC PANEL
ALK PHOS: 83 U/L (ref 38–126)
ALT: 45 U/L — AB (ref 0–44)
ANION GAP: 4 — AB (ref 5–15)
AST: 40 U/L (ref 15–41)
Albumin: 2.2 g/dL — ABNORMAL LOW (ref 3.5–5.0)
BILIRUBIN TOTAL: 0.7 mg/dL (ref 0.3–1.2)
BUN: 21 mg/dL (ref 8–23)
CALCIUM: 8.7 mg/dL — AB (ref 8.9–10.3)
CO2: 31 mmol/L (ref 22–32)
Chloride: 103 mmol/L (ref 98–111)
Creatinine, Ser: 0.47 mg/dL (ref 0.44–1.00)
Glucose, Bld: 98 mg/dL (ref 70–99)
Potassium: 4.2 mmol/L (ref 3.5–5.1)
Sodium: 138 mmol/L (ref 135–145)
TOTAL PROTEIN: 5.5 g/dL — AB (ref 6.5–8.1)

## 2018-05-14 LAB — CBC WITH DIFFERENTIAL/PLATELET
Abs Immature Granulocytes: 0.09 10*3/uL — ABNORMAL HIGH (ref 0.00–0.07)
BASOS PCT: 0 %
Basophils Absolute: 0 10*3/uL (ref 0.0–0.1)
EOS ABS: 0.1 10*3/uL (ref 0.0–0.5)
EOS PCT: 1 %
HEMATOCRIT: 20.7 % — AB (ref 36.0–46.0)
Hemoglobin: 6.5 g/dL — CL (ref 12.0–15.0)
Immature Granulocytes: 1 %
Lymphocytes Relative: 20 %
Lymphs Abs: 1.6 10*3/uL (ref 0.7–4.0)
MCH: 32.3 pg (ref 26.0–34.0)
MCHC: 31.4 g/dL (ref 30.0–36.0)
MCV: 103 fL — ABNORMAL HIGH (ref 80.0–100.0)
Monocytes Absolute: 0.5 10*3/uL (ref 0.1–1.0)
Monocytes Relative: 6 %
NRBC: 0.4 % — AB (ref 0.0–0.2)
Neutro Abs: 5.9 10*3/uL (ref 1.7–7.7)
Neutrophils Relative %: 72 %
PLATELETS: 151 10*3/uL (ref 150–400)
RBC: 2.01 MIL/uL — ABNORMAL LOW (ref 3.87–5.11)
RDW: 19.7 % — AB (ref 11.5–15.5)
WBC: 8.2 10*3/uL (ref 4.0–10.5)

## 2018-05-14 LAB — PREPARE RBC (CROSSMATCH)

## 2018-05-14 LAB — POC OCCULT BLOOD, ED: FECAL OCCULT BLD: NEGATIVE

## 2018-05-14 MED ORDER — SODIUM CHLORIDE 0.9 % IV SOLN: INTRAVENOUS | Status: DC

## 2018-05-14 MED ORDER — OXYCODONE HCL 5 MG PO TABS
2.5000 mg | ORAL_TABLET | Freq: Once | ORAL | Status: AC
  Administered 2018-05-14: 2.5 mg via ORAL
  Filled 2018-05-14: qty 1

## 2018-05-14 MED ORDER — CARVEDILOL 6.25 MG PO TABS
6.2500 mg | ORAL_TABLET | Freq: Two times a day (BID) | ORAL | Status: DC
  Administered 2018-05-15: 6.25 mg via ORAL
  Filled 2018-05-14 (×2): qty 1

## 2018-05-14 MED ORDER — PANTOPRAZOLE SODIUM 40 MG PO TBEC
40.0000 mg | DELAYED_RELEASE_TABLET | Freq: Every day | ORAL | Status: DC
  Administered 2018-05-14 – 2018-05-15 (×2): 40 mg via ORAL
  Filled 2018-05-14 (×2): qty 1

## 2018-05-14 MED ORDER — LEVOTHYROXINE SODIUM 100 MCG PO TABS
100.0000 ug | ORAL_TABLET | Freq: Every day | ORAL | Status: DC
  Administered 2018-05-15: 100 ug via ORAL
  Filled 2018-05-14: qty 1

## 2018-05-14 MED ORDER — FOLIC ACID 1 MG PO TABS
2.0000 mg | ORAL_TABLET | Freq: Every day | ORAL | Status: DC
  Administered 2018-05-15: 2 mg via ORAL
  Filled 2018-05-14: qty 2

## 2018-05-14 MED ORDER — SODIUM CHLORIDE 0.9% IV SOLUTION: Freq: Once | INTRAVENOUS | Status: DC

## 2018-05-14 MED ORDER — OXYCODONE HCL 5 MG PO TABS
2.5000 mg | ORAL_TABLET | Freq: Three times a day (TID) | ORAL | Status: DC | PRN
  Administered 2018-05-15: 2.5 mg via ORAL
  Filled 2018-05-14: qty 1

## 2018-05-14 MED ORDER — LEVETIRACETAM 500 MG PO TABS
500.0000 mg | ORAL_TABLET | Freq: Two times a day (BID) | ORAL | Status: DC
  Administered 2018-05-14 – 2018-05-15 (×2): 500 mg via ORAL
  Filled 2018-05-14 (×2): qty 1

## 2018-05-14 MED ORDER — SERTRALINE HCL 50 MG PO TABS
75.0000 mg | ORAL_TABLET | Freq: Every day | ORAL | Status: DC
  Administered 2018-05-14 – 2018-05-15 (×2): 75 mg via ORAL
  Filled 2018-05-14 (×2): qty 2

## 2018-05-14 MED ORDER — MAGNESIUM OXIDE 400 (241.3 MG) MG PO TABS
400.0000 mg | ORAL_TABLET | Freq: Every day | ORAL | Status: DC
  Administered 2018-05-14 – 2018-05-15 (×2): 400 mg via ORAL
  Filled 2018-05-14 (×2): qty 1

## 2018-05-14 MED ORDER — FERROUS SULFATE 325 (65 FE) MG PO TABS
325.0000 mg | ORAL_TABLET | Freq: Two times a day (BID) | ORAL | Status: DC
  Administered 2018-05-15: 325 mg via ORAL
  Filled 2018-05-14 (×2): qty 1

## 2018-05-14 NOTE — H&P (Signed)
History and Physical    Jean Hodges WUX:[id number] DOB: 1934-06-09 DOA: 05/14/2018  PCP: Patient, No Pcp Per Patient coming from: [facility]  Chief Complaint: Low hemoglobin  HPI: Jean Hodges is a 84 y.o. female with medical history significant of advanced end-stage Alzheimer's dementia, myelodysplastic syndrome not transfusion dependent, CHF, type 2 diabetes, right BKA, hypertension, hyperlipidemia, seizures presenting to the hospital from [facility] rehab for evaluation of anemia.  Patient has severe baseline dementia and is oriented to self only.  No history could be obtained from her.  ED Course: Blood pressure 155/77.  Remainder of vitals stable.  Hemoglobin 6.5, baseline 10-11 a month ago.  FOBT negative.  2 units of packed red blood cells ordered in the ED.  TRH paged to admit.  Review of Systems: As per HPI otherwise 10 point review of systems negative.  Past Medical History:  Diagnosis Date  . Acute encephalopathy   . Acute encephalopathy 09/30/2016  . Alzheimer's dementia without behavioral disturbance ([place])   . Alzheimer's dementia without behavioral disturbance ([place]) 10/07/2016  . Anemia   . Anemia 01/28/2014  . Atherosclerotic PVD with ulceration ([place])   . CAD (coronary artery disease)   . CAD (coronary artery disease) 01/04/2014  . Cellulitis   . Cellulitis of left leg   . CHF (congestive heart failure) ([place])   . Diabetes mellitus without complication ([place])   . DM (diabetes mellitus) ([place]) 01/27/2014  . Dysphagia   . HTN (hypertension) 01/04/2014  . Hyperlipidemia   . Hypertension   . Hypoglycemia associated with type 2 diabetes mellitus ([place])   . Macrocytic anemia   . Seizures ([place]) 09/30/2016  . Tear of left rotator cuff   . UTI (urinary tract infection)   . UTI (urinary tract infection) 09/25/2016    Past Surgical History:  Procedure Laterality Date  . BELOW KNEE LEG AMPUTATION Right   . PACEMAKER IMPLANT       reports that she has never smoked. She  has never used smokeless tobacco. She reports that she does not drink alcohol or use drugs.  Allergies  Allergen Reactions  . Acetaminophen Other (See Comments)    Unknown  . Codeine Nausea Only  . Dilaudid [Hydromorphone] Nausea Only  . Hydrocodone Other (See Comments)    Hallucinations  . Keflex [Cephalexin] Diarrhea    Family History  Family history unknown: Yes    Prior to Admission medications   Medication Sig Start Date End Date Taking? Authorizing Provider  amoxicillin-clavulanate (AUGMENTIN) 875-125 MG tablet Take 1 tablet by mouth 2 (two) times daily.   Yes [provider]  carvedilol (COREG) 6.25 MG tablet Take 6.25 mg by mouth 2 (two) times daily with a meal.   Yes [provider]  ferrous sulfate 325 (65 FE) MG tablet Take 325 mg by mouth 2 (two) times daily with a meal.   Yes [provider]  folic acid (FOLVITE) 1 MG tablet Take 2 mg by mouth daily.   Yes [provider]  furosemide (LASIX) 20 MG tablet Take 0.5 tablets (10 mg total) by mouth daily. 04/06/18  Yes Osei-Bonsu, Iona Beard, MD  levETIRAcetam (KEPPRA) 500 MG tablet Take 500 mg by mouth 2 (two) times daily.   Yes [provider]  levothyroxine (SYNTHROID, LEVOTHROID) 100 MCG tablet Take 100 mcg by mouth daily before breakfast.   Yes [provider]  loperamide (IMODIUM) 2 MG capsule Take 2 capsules (4 mg total) by mouth as needed for diarrhea or loose stools. 04/05/18  Yes Osei-Bonsu, Iona Beard, MD  magnesium oxide (MAG-OX) 400 MG tablet Take 400 mg by mouth daily.   Yes [provider]  Menthol, Topical Analgesic, (BIOFREEZE) 4 % GEL Apply 1 application topically 2 (two) times daily as needed (pain).   Yes [provider]  Multiple Vitamins-Minerals (DECUBI-VITE PO) Take 1 capsule by mouth daily.   Yes [provider]  ondansetron (ZOFRAN) 4 MG tablet Take 4 mg by mouth every 6 (six) hours as needed for nausea or vomiting.   Yes [provider]  OXYCODONE HCL PO Take 2.5 mg by mouth 3 (three) times daily as needed (pain). 14 Days   Yes [provider]  pantoprazole (PROTONIX) 40 MG tablet Take 1 tablet (40 mg total) by mouth daily. 07/25/17  Yes Barton Dubois, MD  potassium chloride SA (K-DUR,KLOR-CON) 20 MEQ tablet Take 20 mEq by mouth daily.   Yes [provider]  sertraline (ZOLOFT) 25 MG tablet Take 75 mg by mouth daily.   Yes [provider]  feeding supplement, ENSURE ENLIVE, (ENSURE ENLIVE) LIQD Take 237 mLs by mouth 2 (two) times daily between meals. Patient not taking: Reported on 04/03/2018 02/21/18   Roseanne, Juenger, DO    Physical Exam: Vitals:   05/14/18 1939 05/14/18 1948 05/14/18 1951 05/14/18 1959  BP: (!) 153/56 (!) 155/60  (!) 157/65  Pulse: 79 78  81  Resp: 17 18  19   Temp: 98.8 F (37.1 C) 98.8 F (37.1 C)  98.6 F (37 C)  TempSrc: Oral Oral  Oral  SpO2:  97% 97% 97%  Weight:      Height:       Physical Exam  Constitutional: No distress.  Frail elderly female Resting comfortably in a hospital stretcher  HENT:  Head: Normocephalic.  Eyes: Right eye exhibits no discharge. Left eye exhibits no discharge.  Neck: Neck supple.  Cardiovascular: Normal rate, regular rhythm and intact distal pulses.  Pulmonary/Chest: Effort normal. She has no wheezes. She has no rales.  Anterior lung fields clear to auscultation  Abdominal: Soft. Bowel sounds are normal. She exhibits no distension. There is no tenderness.  Musculoskeletal: She exhibits no edema.  Right BKA  Neurological:  Awake, alert, and oriented to self only  Skin: Skin is warm and dry. She is not diaphoretic.     Labs on Admission: I have personally reviewed following labs and imaging studies  CBC: Recent Labs  Lab 05/14/18 1550  WBC 8.2  NEUTROABS 5.9  HGB 6.5*  HCT 20.7*  MCV 103.0*  PLT 188   Basic Metabolic Panel: Recent Labs  Lab 05/14/18 1550  NA 138  K 4.2  CL 103  CO2 31  GLUCOSE  98  BUN 21  CREATININE 0.47  CALCIUM 8.7*   GFR: Estimated Creatinine Clearance: 36.9 mL/min (by C-G formula based on SCr of 0.47 mg/dL). Liver Function Tests: Recent Labs  Lab 05/14/18 1550  AST 40  ALT 45*  ALKPHOS 83  BILITOT 0.7  PROT 5.5*  ALBUMIN 2.2*   No results for input(s): LIPASE, AMYLASE in the last 168 hours. No results for input(s): AMMONIA in the last 168 hours. Coagulation Profile: No results for input(s): INR, PROTIME in the last 168 hours. Cardiac Enzymes: No results for input(s): CKTOTAL, CKMB, CKMBINDEX, TROPONINI in the last 168 hours. BNP (last 3 results) No results for input(s): PROBNP in the last 8760 hours. HbA1C: No results for input(s): HGBA1C in the last 72 hours. CBG: No results for input(s): GLUCAP  in the last 168 hours. Lipid Profile: No results for input(s): CHOL, HDL, LDLCALC, TRIG, CHOLHDL, LDLDIRECT in the last 72 hours. Thyroid Function Tests: No results for input(s): TSH, T4TOTAL, FREET4, T3FREE, THYROIDAB in the last 72 hours. Anemia Panel: No results for input(s): VITAMINB12, FOLATE, FERRITIN, TIBC, IRON, RETICCTPCT in the last 72 hours. Urine analysis:    Component Value Date/Time   COLORURINE YELLOW 02/20/2018 1827   APPEARANCEUR HAZY (A) 02/20/2018 1827   LABSPEC 1.010 02/20/2018 1827   PHURINE 6.0 02/20/2018 1827   GLUCOSEU NEGATIVE 02/20/2018 1827   HGBUR NEGATIVE 02/20/2018 1827   BILIRUBINUR NEGATIVE 02/20/2018 1827   KETONESUR NEGATIVE 02/20/2018 1827   PROTEINUR NEGATIVE 02/20/2018 1827   NITRITE NEGATIVE 02/20/2018 1827   LEUKOCYTESUR LARGE (A) 02/20/2018 1827    Radiological Exams on Admission: No results found.  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  Followed by hematology and oncology at [facility], last office visit May 2019.  Per documentation, she has declined a bone marrow biopsy repeatedly in the past.  Hemoglobin 6.5 and MCV 103 on admission, recent baseline 10-11.  No clinical signs of GI blood loss.  FOBT negative. S/p 2 units of packed red blood cells in the ED.  -Repeat CBC after transfusion  History of seizures -Continue home Keppra  Chronic systolic CHF Echo done in April 2019 showing EF 20 to 25%.  Currently not volume overloaded on exam. -Continue home Coreg -Hold home Lasix at this time  Type 2 diabetes Currently not on any medications.  A1c 6.2 in April 2018.  Blood glucose 98 on admission. -Check A1c -Hold insulin at this time due to history of hypoglycemia  Hypertension Systolic in the [address]. -Continue home Coreg  GERD -Continue home Protonix 40 mg daily  Depression -Continue home Zoloft  Hypothyroidism -Continue home Synthroid  Chronic pain -Continue home oxycodone 2.5 mg 3 times a day as needed  Severe protein calorie malnutrition Albumin 2.2.  -Continue home feeding supplement  -Nutrition consult  Nursing home placement -Social work consult  DVT prophylaxis: SCDs Code Status: Patient has severe baseline dementia and is oriented to self only.  This needs to be discussed with the family. Family Communication: No family available at this time.  Disposition Plan: Anticipate discharge to nursing home in 1 to 2 days. Consults called: None Admission status: Observation   Shela Leff MD [facility] Pager [phone]  If 7PM-7AM, please contact night-coverage [email or web address] Password [facility]  05/14/2018, 8:34 PM

## 2018-05-14 NOTE — ED Provider Notes (Signed)
[facility] DEPT Provider Note   CSN: [id number] Arrival date & time: 05/14/18  1510     History   Chief Complaint Chief Complaint  Patient presents with  . Abnormal Lab  . low hemoglobin    HPI Jean Hodges is a 84 y.o. female.  The history is provided by the patient and medical records. No language interpreter was used.  Abnormal Lab   Jean Hodges is a 84 y.o. female  with a PMH of chronic recurrent anemia believed to be due to MDS although family does not want bone marrow biopsy to see for formal diagnosis who presents to the Emergency Department for low hemoglobin level.  At blood work performed today, hemoglobin was 6.7.  She was sent to the emergency department from skilled nursing facility [facility]) for further work-up.  Patient unable to provide any history regarding today's hospital visit due to dementia.  She does deny any pain.  Level V caveat applies 2/2 dementia   Past Medical History:  Diagnosis Date  . Acute encephalopathy   . Acute encephalopathy 09/30/2016  . Alzheimer's dementia without behavioral disturbance ([place])   . Alzheimer's dementia without behavioral disturbance ([place]) 10/07/2016  . Anemia   . Anemia 01/28/2014  . Atherosclerotic PVD with ulceration ([place])   . CAD (coronary artery disease)   . CAD (coronary artery disease) 01/04/2014  . Cellulitis   . Cellulitis of left leg   . CHF (congestive heart failure) ([place])   . Diabetes mellitus without complication ([place])   . DM (diabetes mellitus) ([place]) 01/27/2014  . Dysphagia   . HTN (hypertension) 01/04/2014  . Hyperlipidemia   . Hypertension   . Hypoglycemia associated with type 2 diabetes mellitus ([place])   . Macrocytic anemia   . Seizures ([place]) 09/30/2016  . Tear of left rotator cuff   . UTI (urinary tract infection)   . UTI (urinary tract infection) 09/25/2016    Patient Active Problem List   Diagnosis Date Noted  . Hx of BKA, right ([place])  04/04/2018  . Chronic systolic CHF (congestive heart failure) ([place]) 02/20/2018  . Sacral decubitus ulcer 02/20/2018  . Counseling regarding advanced care planning and goals of care 01/17/2018  . Large stool   . Abdominal pain 01/16/2018  . Seizure disorder ([place]) 12/29/2017  . Memory loss 12/02/2017  . Bruising, spontaneous 12/01/2017  . Adult failure to thrive   . Palliative care by specialist   . Palliative care encounter   . Altered mental status   . Pressure injury of skin 11/04/2017  . Hyperkalemia 11/04/2017  . UTI (urinary tract infection) 11/03/2017  . Severe protein-calorie malnutrition ([place]) 10/10/2017  . Macrocytic anemia 07/23/2017  . Symptomatic anemia 11/27/2016  . Tonic-clonic seizure disorder ([place]) 10/09/2016  . Dysphagia 10/09/2016  . Moderate protein-calorie malnutrition ([place]) 10/09/2016  . Hypothyroidism (acquired) 10/09/2016  . Type 2 diabetes mellitus with peripheral vascular disease ([place]) 10/09/2016  . PVD (peripheral vascular disease) ([place]) 10/09/2016  . Essential hypertension 10/09/2016  . Alzheimer's dementia ([place]) 10/09/2016  . Chronic depression 10/09/2016  . Hyperlipidemia 10/09/2016  . Coronary artery disease involving native coronary artery of native heart without angina pectoris 10/09/2016    Past Surgical History:  Procedure Laterality Date  . BELOW KNEE LEG AMPUTATION Right   . PACEMAKER IMPLANT       OB History   None      Home Medications    Prior to Admission medications   Medication Sig Start Date  End Date Taking? Authorizing Provider  amoxicillin-clavulanate (AUGMENTIN) 875-125 MG tablet Take 1 tablet by mouth 2 (two) times daily.   Yes [provider]  carvedilol (COREG) 6.25 MG tablet Take 6.25 mg by mouth 2 (two) times daily with a meal.   Yes [provider]  ferrous sulfate 325 (65 FE) MG tablet Take 325 mg by mouth 2 (two) times daily with a meal.   Yes [provider]  folic acid (FOLVITE) 1 MG  tablet Take 2 mg by mouth daily.   Yes [provider]  furosemide (LASIX) 20 MG tablet Take 0.5 tablets (10 mg total) by mouth daily. 04/06/18  Yes Osei-Bonsu, Iona Beard, MD  levETIRAcetam (KEPPRA) 500 MG tablet Take 500 mg by mouth 2 (two) times daily.   Yes [provider]  levothyroxine (SYNTHROID, LEVOTHROID) 100 MCG tablet Take 100 mcg by mouth daily before breakfast.   Yes [provider]  loperamide (IMODIUM) 2 MG capsule Take 2 capsules (4 mg total) by mouth as needed for diarrhea or loose stools. 04/05/18  Yes Osei-Bonsu, Iona Beard, MD  magnesium oxide (MAG-OX) 400 MG tablet Take 400 mg by mouth daily.   Yes [provider]  Menthol, Topical Analgesic, (BIOFREEZE) 4 % GEL Apply 1 application topically 2 (two) times daily as needed (pain).   Yes [provider]  Multiple Vitamins-Minerals (DECUBI-VITE PO) Take 1 capsule by mouth daily.   Yes [provider]  ondansetron (ZOFRAN) 4 MG tablet Take 4 mg by mouth every 6 (six) hours as needed for nausea or vomiting.   Yes [provider]  OXYCODONE HCL PO Take 2.5 mg by mouth 3 (three) times daily as needed (pain). 14 Days   Yes [provider]  pantoprazole (PROTONIX) 40 MG tablet Take 1 tablet (40 mg total) by mouth daily. 07/25/17  Yes Barton Dubois, MD  potassium chloride SA (K-DUR,KLOR-CON) 20 MEQ tablet Take 20 mEq by mouth daily.   Yes [provider]  sertraline (ZOLOFT) 25 MG tablet Take 75 mg by mouth daily.   Yes [provider]  feeding supplement, ENSURE ENLIVE, (ENSURE ENLIVE) LIQD Take 237 mLs by mouth 2 (two) times daily between meals. Patient not taking: Reported on 04/03/2018 02/21/18   Cristal Ford, DO    Family History Family History  Family history unknown: Yes    Social History Social History   Tobacco Use  . Smoking status: Never Smoker  . Smokeless tobacco: Never Used  Substance Use Topics  . Alcohol use: No  . Drug use: No       Allergies   Acetaminophen; Codeine; Dilaudid [hydromorphone]; Hydrocodone; and Keflex [cephalexin]   Review of Systems Review of Systems  Unable to perform ROS: Dementia     Physical Exam Updated Vital Signs BP (!) 155/60 (BP Location: Left Arm)   Pulse 78   Temp 98.8 F (37.1 C) (Oral)   Resp 18   Ht _0  (1.6 m)   Wt 43.1 kg   SpO2 97%   BMI 16.83 kg/m   Physical Exam  Constitutional: She appears well-developed and well-nourished. No distress.  HENT:  Head: Normocephalic and atraumatic.  Neck: Neck supple.  Cardiovascular: Normal rate, regular rhythm and normal heart sounds.  No murmur heard. Pulmonary/Chest: Effort normal and breath sounds normal. No respiratory distress.  Abdominal: Soft. She exhibits no distension. There is no tenderness.  Neurological: She is alert.  Skin: Skin is warm and dry. There is pallor.  Nursing note and vitals  reviewed.    ED Treatments / Results  Labs (all labs ordered are listed, but only abnormal results are displayed) Labs Reviewed  COMPREHENSIVE METABOLIC PANEL - Abnormal; Notable for the following components:      Result Value   Calcium 8.7 (*)    Total Protein 5.5 (*)    Albumin 2.2 (*)    ALT 45 (*)    Anion gap 4 (*)    All other components within normal limits  CBC WITH DIFFERENTIAL/PLATELET - Abnormal; Notable for the following components:   RBC 2.01 (*)    Hemoglobin 6.5 (*)    HCT 20.7 (*)    MCV 103.0 (*)    RDW 19.7 (*)    nRBC 0.4 (*)    Abs Immature Granulocytes 0.09 (*)    All other components within normal limits  POC OCCULT BLOOD, ED  TYPE AND SCREEN  PREPARE RBC (CROSSMATCH)    EKG None  Radiology No results found.  Procedures Procedures (including critical care time)  CRITICAL CARE Performed by: Ozella Almond Orilla Templeman   Total critical care time: 35 minutes  Critical care time was exclusive of separately billable procedures and treating other patients.  Critical care was  necessary to treat or prevent imminent or life-threatening deterioration.  Critical care was time spent personally by me on the following activities: development of treatment plan with patient and/or surrogate as well as nursing, discussions with consultants, evaluation of patient's response to treatment, examination of patient, obtaining history from patient or surrogate, ordering and performing treatments and interventions, ordering and review of laboratory studies, ordering and review of radiographic studies, pulse oximetry and re-evaluation of patient's condition.   Medications Ordered in ED Medications  0.9 %  sodium chloride infusion (Manually program via Guardrails IV Fluids) (has no administration in time range)  oxyCODONE (Oxy IR/ROXICODONE) immediate release tablet 2.5 mg (2.5 mg Oral Given 05/14/18 1759)     Initial Impression / Assessment and Plan / ED Course  I have reviewed the triage vital signs and the nursing notes.  Pertinent labs & imaging results that were available during my care of the patient were reviewed by me and considered in my medical decision making (see chart for details).    Taylore Hinde is a 84 y.o. female who presents to ED from facility for anemia.  CBC today shows hemoglobin of 6.5.  Hemoccult negative.  Has history of similar requiring multiple transfusions.  Will transfuse 2 units. Hospitalist consulted who will admit.    Patient seen by and discussed with Dr. Gilford Raid who agrees with treatment plan.    Final Clinical Impressions(s) / ED Diagnoses   Final diagnoses:  Anemia, unspecified type    ED Discharge Orders    None       Jinnie Onley, Ozella Almond, PA-C 05/14/18 Lona Kettle    Isla Pence, MD 05/14/18 2022

## 2018-05-14 NOTE — ED Notes (Signed)
ED TO INPATIENT HANDOFF REPORT  Name/Age/Gender Jean Hodges 84 y.o. female  Code Status    Code Status Orders  (From admission, onward)         Start     Ordered   05/14/18 2016  Full code  Continuous     05/14/18 2018        Code Status History    Date Active Date Inactive Code Status Order ID Comments User Context   04/04/2018 0212 04/06/2018 1753 Full Code [id number]  Gwynne Edinger, MD Inpatient   02/20/2018 2123 02/21/2018 1905 Full Code [id number]  Etta Quill, DO ED   01/16/2018 2309 01/19/2018 1619 Full Code [id number]  Rise Patience, MD Inpatient   12/29/2017 2110 12/30/2017 2059 Full Code [id number]  Rise Patience, MD ED   11/03/2017 1648 11/14/2017 1942 Full Code [id number]  Kayleen Memos, DO ED   10/09/2017 2031 10/10/2017 1831 Full Code [id number]  Norval Morton, MD ED   07/23/2017 1509 07/24/2017 1809 Full Code [id number]  Phillips Grout, MD Inpatient   11/27/2016 2203 11/28/2016 2154 Full Code [id number]  Rise Patience, MD Inpatient      Home/SNF/Other Nursing Home  Chief Complaint abnormal labs  Level of Care/Admitting Diagnosis ED Disposition    ED Disposition Condition [place] [facility] Area: [facility] [[id number]]  Level of Care: Med-Surg [16]  Diagnosis: Refractory anemia due to myelodysplastic syndrome [facility]) [235573]  Admitting Physician: Shela Leff [[id number]]  Attending Physician: Shela Leff [[id number]]  PT Class (Do Not Modify): Observation [104]  PT Acc Code (Do Not Modify): Observation [10022]       Medical History Past Medical History:  Diagnosis Date  . Acute encephalopathy   . Acute encephalopathy 09/30/2016  . Alzheimer's dementia without behavioral disturbance ([place])   . Alzheimer's dementia without behavioral disturbance ([place]) 10/07/2016  . Anemia   . Anemia 01/28/2014  . Atherosclerotic PVD with ulceration ([place])   . CAD (coronary artery disease)   . CAD (coronary artery disease)  01/04/2014  . Cellulitis   . Cellulitis of left leg   . CHF (congestive heart failure) ([place])   . Diabetes mellitus without complication ([place])   . DM (diabetes mellitus) ([place]) 01/27/2014  . Dysphagia   . HTN (hypertension) 01/04/2014  . Hyperlipidemia   . Hypertension   . Hypoglycemia associated with type 2 diabetes mellitus ([place])   . Macrocytic anemia   . Seizures ([place]) 09/30/2016  . Tear of left rotator cuff   . UTI (urinary tract infection)   . UTI (urinary tract infection) 09/25/2016    Allergies Allergies  Allergen Reactions  . Acetaminophen Other (See Comments)    Unknown  . Codeine Nausea Only  . Dilaudid [Hydromorphone] Nausea Only  . Hydrocodone Other (See Comments)    Hallucinations  . Keflex [Cephalexin] Diarrhea    IV Location/Drains/Wounds Patient Lines/Drains/Airways Status   Active Line/Drains/Airways    Name:   Placement date:   Placement time:   Site:   Days:   Peripheral IV 05/14/18 Right Forearm   05/14/18    1722    Forearm   less than 1   External Urinary Catheter   04/04/18    0224    -   40   Pressure Injury 04/06/18 Stage II -  Partial thickness loss of dermis presenting as a shallow open ulcer with a red, pink wound bed without slough.   04/06/18    2831  38   Pressure Injury 12/29/17 Stage II -  Partial thickness loss of dermis presenting as a shallow open ulcer with a red, pink wound bed without slough.   12/29/17    2300     136   Pressure Injury 12/29/17 Stage II -  Partial thickness loss of dermis presenting as a shallow open ulcer with a red, pink wound bed without slough.   12/29/17    2300     136          Labs/Imaging Results for orders placed or performed during the hospital encounter of 05/14/18 (from the past 48 hour(s))  Comprehensive metabolic panel     Status: Abnormal   Collection Time: 05/14/18  3:50 PM  Result Value Ref Range   Sodium 138 135 - 145 mmol/L   Potassium 4.2 3.5 - 5.1 mmol/L   Chloride 103 98 - 111 mmol/L    CO2 31 22 - 32 mmol/L   Glucose, Bld 98 70 - 99 mg/dL   BUN 21 8 - 23 mg/dL   Creatinine, Ser 0.47 0.44 - 1.00 mg/dL   Calcium 8.7 (L) 8.9 - 10.3 mg/dL   Total Protein 5.5 (L) 6.5 - 8.1 g/dL   Albumin 2.2 (L) 3.5 - 5.0 g/dL   AST 40 15 - 41 U/L   ALT 45 (H) 0 - 44 U/L   Alkaline Phosphatase 83 38 - 126 U/L   Total Bilirubin 0.7 0.3 - 1.2 mg/dL   GFR calc non Af Amer >60 >60 mL/min   GFR calc Af Amer >60 >60 mL/min    Comment: (NOTE) The eGFR has been calculated using the CKD EPI equation. This calculation has not been validated in all clinical situations. eGFR's persistently <60 mL/min signify possible Chronic Kidney Disease.    Anion gap 4 (L) 5 - 15    Comment: Performed at [facility], [place] [address], [place], [place] [address]  CBC with Differential     Status: Abnormal   Collection Time: 05/14/18  3:50 PM  Result Value Ref Range   WBC 8.2 4.0 - 10.5 K/uL   RBC 2.01 (L) 3.87 - 5.11 MIL/uL   Hemoglobin 6.5 (LL) 12.0 - 15.0 g/dL    Comment: REPEATED TO VERIFY THIS CRITICAL RESULT HAS VERIFIED AND BEEN CALLED TO GRIFFIN,M. RN BY KATHLEEN COHEN ON 10 31 2019 AT 1739, AND HAS BEEN READ BACK. CRITICAL RESULT VERIFIED    HCT 20.7 (L) 36.0 - 46.0 %   MCV 103.0 (H) 80.0 - 100.0 fL   MCH 32.3 26.0 - 34.0 pg   MCHC 31.4 30.0 - 36.0 g/dL   RDW 19.7 (H) 11.5 - 15.5 %   Platelets 151 150 - 400 K/uL   nRBC 0.4 (H) 0.0 - 0.2 %   Neutrophils Relative % 72 %   Neutro Abs 5.9 1.7 - 7.7 K/uL   Lymphocytes Relative 20 %   Lymphs Abs 1.6 0.7 - 4.0 K/uL   Monocytes Relative 6 %   Monocytes Absolute 0.5 0.1 - 1.0 K/uL   Eosinophils Relative 1 %   Eosinophils Absolute 0.1 0.0 - 0.5 K/uL   Basophils Relative 0 %   Basophils Absolute 0.0 0.0 - 0.1 K/uL   Immature Granulocytes 1 %   Abs Immature Granulocytes 0.09 (H) 0.00 - 0.07 K/uL    Comment: Performed at [facility], [place] [address] [place], [place] [address]  POC occult blood, ED     Status: None  Collection Time: 05/14/18  4:23 PM  Result Value Ref Range   Fecal Occult Bld NEGATIVE NEGATIVE  Type and screen     Status: None (Preliminary result)   Collection Time: 05/14/18  4:48 PM  Result Value Ref Range   ABO/RH(D) A POS    Antibody Screen NEG    Sample Expiration 05/17/2018    Unit Number [id number]    Blood Component Type RED CELLS,LR    Unit division 00    Status of Unit ISSUED    Transfusion Status OK TO TRANSFUSE    Crossmatch Result      Compatible Performed at [place] [address] [place], [place] [address]    Unit Number [id number]    Blood Component Type RED CELLS,LR    Unit division 00    Status of Unit ALLOCATED    Transfusion Status OK TO TRANSFUSE    Crossmatch Result Compatible   Prepare RBC     Status: None   Collection Time: 05/14/18  5:53 PM  Result Value Ref Range   Order Confirmation      ORDER PROCESSED BY BLOOD BANK Performed at [facility], [place] [address], [place], [place] [address]    No results found. None  Pending Labs Unresulted Labs (From admission, onward)    Start     Ordered   05/15/18 0500  Hemoglobin A1c  Tomorrow morning,   R     05/14/18 2025   05/14/18 2017  CBC  Once,   R    Comments:  After patient receives 2 units of packed red blood cells.    05/14/18 2018          Vitals/Pain Today's Vitals   05/14/18 2030 05/14/18 2045 05/14/18 2100 05/14/18 2115  BP: (!) 155/61 (!) 167/59 (!) 161/68 (!) 160/67  Pulse: 78 80 78 79  Resp: 15 16 15 16   Temp:      TempSrc:      SpO2: 96% 98% 97% 98%  Weight:      Height:      PainSc:        Isolation Precautions No active isolations  Medications Medications  0.9 %  sodium chloride infusion (Manually program via Guardrails IV Fluids) (has no administration in time range)  oxyCODONE (Oxy IR/ROXICODONE) immediate release tablet 2.5 mg (has no administration in time range)  carvedilol (COREG) tablet 6.25 mg (has no  administration in time range)  sertraline (ZOLOFT) tablet 75 mg (has no administration in time range)  levothyroxine (SYNTHROID, LEVOTHROID) tablet 100 mcg (has no administration in time range)  magnesium oxide (MAG-OX) tablet 400 mg (has no administration in time range)  pantoprazole (PROTONIX) EC tablet 40 mg (has no administration in time range)  ferrous sulfate tablet 325 mg (has no administration in time range)  folic acid (FOLVITE) tablet 2 mg (has no administration in time range)  levETIRAcetam (KEPPRA) tablet 500 mg (has no administration in time range)  oxyCODONE (Oxy IR/ROXICODONE) immediate release tablet 2.5 mg (2.5 mg Oral Given 05/14/18 1759)    Mobility non-ambulatory

## 2018-05-14 NOTE — ED Notes (Signed)
Bed: WA20 Expected date:  Expected time:  Means of arrival:  Comments: EMS [facility] low hgb

## 2018-05-14 NOTE — ED Triage Notes (Signed)
Per EMS: Pt from [facility].  Pt's last hgb was 6.7.  No other pt c/o.

## 2018-05-14 NOTE — ED Notes (Signed)
Consent for blood tx signed by provider and witnessed by 2nd nurse.

## 2018-05-15 ENCOUNTER — Other Ambulatory Visit: Payer: Self-pay

## 2018-05-15 DIAGNOSIS — D464 Refractory anemia, unspecified: Secondary | ICD-10-CM

## 2018-05-15 DIAGNOSIS — G40909 Epilepsy, unspecified, not intractable, without status epilepticus: Secondary | ICD-10-CM

## 2018-05-15 DIAGNOSIS — G40409 Other generalized epilepsy and epileptic syndromes, not intractable, without status epilepticus: Secondary | ICD-10-CM

## 2018-05-15 DIAGNOSIS — R627 Adult failure to thrive: Secondary | ICD-10-CM

## 2018-05-15 DIAGNOSIS — G894 Chronic pain syndrome: Secondary | ICD-10-CM

## 2018-05-15 DIAGNOSIS — D649 Anemia, unspecified: Secondary | ICD-10-CM | POA: Diagnosis not present

## 2018-05-15 DIAGNOSIS — E039 Hypothyroidism, unspecified: Secondary | ICD-10-CM

## 2018-05-15 DIAGNOSIS — I10 Essential (primary) hypertension: Secondary | ICD-10-CM

## 2018-05-15 DIAGNOSIS — L89899 Pressure ulcer of other site, unspecified stage: Secondary | ICD-10-CM

## 2018-05-15 DIAGNOSIS — E43 Unspecified severe protein-calorie malnutrition: Secondary | ICD-10-CM

## 2018-05-15 DIAGNOSIS — T8789 Other complications of amputation stump: Secondary | ICD-10-CM

## 2018-05-15 DIAGNOSIS — L89150 Pressure ulcer of sacral region, unstageable: Secondary | ICD-10-CM

## 2018-05-15 DIAGNOSIS — D469 Myelodysplastic syndrome, unspecified: Secondary | ICD-10-CM | POA: Diagnosis not present

## 2018-05-15 LAB — CBC
HCT: 37.1 % (ref 36.0–46.0)
HEMOGLOBIN: 11.9 g/dL — AB (ref 12.0–15.0)
MCH: 31.4 pg (ref 26.0–34.0)
MCHC: 32.1 g/dL (ref 30.0–36.0)
MCV: 97.9 fL (ref 80.0–100.0)
Platelets: 150 10*3/uL (ref 150–400)
RBC: 3.79 MIL/uL — ABNORMAL LOW (ref 3.87–5.11)
RDW: 18.8 % — ABNORMAL HIGH (ref 11.5–15.5)
WBC: 8.5 10*3/uL (ref 4.0–10.5)
nRBC: 0.5 % — ABNORMAL HIGH (ref 0.0–0.2)

## 2018-05-15 LAB — GLUCOSE, CAPILLARY
GLUCOSE-CAPILLARY: 79 mg/dL (ref 70–99)
GLUCOSE-CAPILLARY: 154 mg/dL — AB (ref 70–99)

## 2018-05-15 LAB — HEMOGLOBIN A1C
HEMOGLOBIN A1C: 5.9 % — AB (ref 4.8–5.6)
Mean Plasma Glucose: 122.63 mg/dL

## 2018-05-15 LAB — MRSA PCR SCREENING: MRSA BY PCR: POSITIVE — AB

## 2018-05-15 MED ORDER — ACETAMINOPHEN 325 MG PO TABS
650.0000 mg | ORAL_TABLET | Freq: Four times a day (QID) | ORAL | Status: DC | PRN
  Administered 2018-05-15: 325 mg via ORAL
  Administered 2018-05-15: 650 mg via ORAL
  Filled 2018-05-15 (×2): qty 2

## 2018-05-15 MED ORDER — ADULT MULTIVITAMIN LIQUID CH
15.0000 mL | Freq: Every day | ORAL | Status: DC
  Filled 2018-05-15: qty 15

## 2018-05-15 MED ORDER — ENSURE ENLIVE PO LIQD: 1.0000 | Freq: Four times a day (QID) | ORAL | 0 refills | Status: DC

## 2018-05-15 MED ORDER — OXYCODONE HCL 5 MG PO TABS: 2.5000 mg | ORAL_TABLET | Freq: Three times a day (TID) | ORAL | 0 refills | Status: DC | PRN

## 2018-05-15 MED ORDER — ENSURE ENLIVE PO LIQD: 237.0000 mL | Freq: Two times a day (BID) | ORAL | Status: DC

## 2018-05-15 NOTE — Discharge Summary (Signed)
Physician Discharge Summary  Jean Hodges PJK:[id number] DOB: Oct 07, 1933 DOA: 05/14/2018  PCP: Patient, No Pcp Per  Admit date: 05/14/2018 Discharge date: 05/15/2018  Admitted From: SNF Disposition:  SNF   Recommendations for Outpatient Follow-up:  1. Continue discussions regarding transitioning to DNR and hospice with son 2. Consider sending to outpt transfusion center rather then seding to the hospital for further blood transfusions 3. Stopping Lasix- check daily weights and order PRN if needed 4. Turn patient frequently- wound care needed for pressure ulcers on coccyx and right BKA   Discharge Condition:  stable  CODE STATUS:  Full code   Diet recommendations: regular diet with supplements Consultations:  none   Discharge Diagnoses:  Principal Problem:   Refractory anemia due to myelodysplastic syndrome ([place]) Active Problems:   Tonic-clonic seizure disorder (HCC)   Hypothyroidism   HTN (hypertension)   Depression   Symptomatic anemia   Severe protein-calorie malnutrition (HCC)   Seizure disorder (HCC)   Chronic systolic CHF (congestive heart failure) (HCC)   Type 2 diabetes mellitus (HCC)   GERD (gastroesophageal reflux disease)   Chronic pain   Decubitus ulcer of coccygeal region, unstageable ([place])   Pressure ulcer of BKA stump ([place])     Brief Summary: Jean Hodges is a 84 y.o. female followed by outpt hospice with medical history significant of advanced end-stage Alzheimer's dementia, myelodysplastic syndrome not transfusion dependent, CHF, type 2 diabetes, right BKA, hypertension, hyperlipidemia, seizures presenting to the hospital from [facility] rehab for evaluation of anemia.  Patient has severe baseline dementia and is oriented to self only.  No history could be obtained from her. In ED >  Hemoglobin 6.5 - FOBT negative.  2 units of packed red blood cells   Hospital Course:  Refractory anemia In the setting of myelodysplastic syndrome.  Followed by hematology and  oncology at [facility], last office visit May 2019 when hospice was recommended  Per documentation, she has declined a bone marrow biopsy repeatedly in the past.  Hemoglobin 6.5 and MCV 103 on admission, recent baseline 10-11.  No clinical signs of GI blood loss.  FOBT negative. S/p 2 units of packed red blood cells in the ED.  -Repeat CBC after transfusion  History of seizures -Continue home Keppra  Chronic systolic CHF Echo done in April 2019 showing EF 20 to 25%.  Currently not volume overloaded on exam. -Continue home Coreg -stop Lasix due to poor oral intake  Type 2 diabetes Currently not on any medications.  A1c 6.2 in April 2018.  Blood glucose 98 on admission. - A1c is 5.9 now without medications   Hypertension Systolic in the 809X. -Continue home Coreg  GERD -Continue home Protonix 40 mg daily  Depression -Continue home Zoloft  Hypothyroidism -Continue home Synthroid  Chronic pain -Continue home oxycodone 2.5 mg 3 times a day as needed  Severe protein calorie malnutrition/ underweight Body mass index is 16.83 kg/m. very cachectic on exam, sternal bones seen through her skin Albumin 2.2.  -Continue home feeding supplement - Ensure increased on son's request -Nutrition consult  Unstageable  pressure ulcers The right BKA has an unstageable wound that measures 1.5 cm x 1.2 cm and is 90% yellow slough, 10% pink.   Coccyx with an unstageable PI that  5 cm x 4.7 cm x unknown depth Wound care recommendations> Thin hydrocolloid to both areas. Change every 3 days and prn.  Next change date 05/18/18.  Severe alzheimer's dementia  Prognosis: She needs to be comfort care and DNR  which I did discuss with son today. He is fixated on increasing her Ensure and making sure she regains her weight. He states he will discuss the DNR with his sister. Please continue these discussions.   Discharge Exam: Vitals:   05/15/18 0320 05/15/18 0520  BP: (!) 168/80 (!) 164/67   Pulse: 78 79  Resp: 18   Temp: 98.4 F (36.9 C) 98.6 F (37 C)  SpO2: 97% 97%   Vitals:   05/15/18 0030 05/15/18 0057 05/15/18 0320 05/15/18 0520  BP:  (!) 155/64 (!) 168/80 (!) 164/67  Pulse:  79 78 79  Resp:  19 18   Temp: 98.5 F (36.9 C) 98.3 F (36.8 C) 98.4 F (36.9 C) 98.6 F (37 C)  TempSrc:  Oral Oral Oral  SpO2:  97% 97% 97%  Weight:      Height:        General: Pt is alert, awake, not in acute distress Cardiovascular: RRR, S1/S2 +, no rubs, no gallops Respiratory: CTA bilaterally, no wheezing, no rhonchi Abdominal: Soft, NT, ND, bowel sounds + Extremities: no edema, no cyanosis   Discharge Instructions  Discharge Instructions    Increase activity slowly   Complete by:  As directed      Allergies as of 05/15/2018      Reactions   Acetaminophen Other (See Comments)   Unknown   Codeine Nausea Only   Dilaudid [hydromorphone] Nausea Only   Hydrocodone Other (See Comments)   Hallucinations   Keflex [cephalexin] Diarrhea      Medication List    STOP taking these medications   amoxicillin-clavulanate 875-125 MG tablet Commonly known as:  AUGMENTIN   furosemide 20 MG tablet Commonly known as:  LASIX     TAKE these medications   BIOFREEZE 4 % Gel Generic drug:  Menthol (Topical Analgesic) Apply 1 application topically 2 (two) times daily as needed (pain).   carvedilol 6.25 MG tablet Commonly known as:  COREG Take 6.25 mg by mouth 2 (two) times daily with a meal.   DECUBI-VITE PO Take 1 capsule by mouth daily.   feeding supplement (ENSURE ENLIVE) Liqd Take 237 mLs by mouth 4 (four) times daily. What changed:  when to take this   ferrous sulfate 325 (65 FE) MG tablet Take 325 mg by mouth 2 (two) times daily with a meal.   folic acid 1 MG tablet Commonly known as:  FOLVITE Take 2 mg by mouth daily.   levETIRAcetam 500 MG tablet Commonly known as:  KEPPRA Take 500 mg by mouth 2 (two) times daily.   levothyroxine 100 MCG  tablet Commonly known as:  SYNTHROID, LEVOTHROID Take 100 mcg by mouth daily before breakfast.   loperamide 2 MG capsule Commonly known as:  IMODIUM Take 2 capsules (4 mg total) by mouth as needed for diarrhea or loose stools.   magnesium oxide 400 MG tablet Commonly known as:  MAG-OX Take 400 mg by mouth daily.   ondansetron 4 MG tablet Commonly known as:  ZOFRAN Take 4 mg by mouth every 6 (six) hours as needed for nausea or vomiting.   oxyCODONE 5 MG immediate release tablet Commonly known as:  Oxy IR/ROXICODONE Take 0.5 tablets (2.5 mg total) by mouth every 8 (eight) hours as needed for moderate pain or severe pain. What changed:    medication strength  when to take this  reasons to take this  additional instructions   pantoprazole 40 MG tablet Commonly known as:  PROTONIX Take 1 tablet (40  mg total) by mouth daily.   potassium chloride SA 20 MEQ tablet Commonly known as:  K-DUR,KLOR-CON Take 20 mEq by mouth daily.   sertraline 25 MG tablet Commonly known as:  ZOLOFT Take 75 mg by mouth daily.       Allergies  Allergen Reactions  . Acetaminophen Other (See Comments)    Unknown  . Codeine Nausea Only  . Dilaudid [Hydromorphone] Nausea Only  . Hydrocodone Other (See Comments)    Hallucinations  . Keflex [Cephalexin] Diarrhea     Procedures/Studies:    No results found.   The results of significant diagnostics from this hospitalization (including imaging, microbiology, ancillary and laboratory) are listed below for reference.     Microbiology: Recent Results (from the past 240 hour(s))  MRSA PCR Screening     Status: Abnormal   Collection Time: 05/15/18  9:53 AM  Result Value Ref Range Status   MRSA by PCR POSITIVE (A) NEGATIVE Final    Comment:        The GeneXpert MRSA Assay (FDA approved for NASAL specimens only), is one component of a comprehensive MRSA colonization surveillance program. It is not intended to diagnose MRSA infection nor  to guide or monitor treatment for MRSA infections. RESULT CALLED TO, READ BACK BY AND VERIFIED WITH: Edythe Lynn [id number] @ 68 BY J SCOTTON Performed at [facility], [place] [address] [place], [place] [address]      Labs: BNP (last 3 results) No results for input(s): BNP in the last 8760 hours. Basic Metabolic Panel: Recent Labs  Lab 05/14/18 1550  NA 138  K 4.2  CL 103  CO2 31  GLUCOSE 98  BUN 21  CREATININE 0.47  CALCIUM 8.7*   Liver Function Tests: Recent Labs  Lab 05/14/18 1550  AST 40  ALT 45*  ALKPHOS 83  BILITOT 0.7  PROT 5.5*  ALBUMIN 2.2*   No results for input(s): LIPASE, AMYLASE in the last 168 hours. No results for input(s): AMMONIA in the last 168 hours. CBC: Recent Labs  Lab 05/14/18 1550 05/15/18 0518  WBC 8.2 8.5  NEUTROABS 5.9  --   HGB 6.5* 11.9*  HCT 20.7* 37.1  MCV 103.0* 97.9  PLT 151 150   Cardiac Enzymes: No results for input(s): CKTOTAL, CKMB, CKMBINDEX, TROPONINI in the last 168 hours. BNP: Invalid input(s): POCBNP CBG: Recent Labs  Lab 05/15/18 0729 05/15/18 1101  GLUCAP 79 154*   D-Dimer No results for input(s): DDIMER in the last 72 hours. Hgb A1c Recent Labs    05/15/18 0518  HGBA1C 5.9*   Lipid Profile No results for input(s): CHOL, HDL, LDLCALC, TRIG, CHOLHDL, LDLDIRECT in the last 72 hours. Thyroid function studies No results for input(s): TSH, T4TOTAL, T3FREE, THYROIDAB in the last 72 hours.  Invalid input(s): FREET3 Anemia work up No results for input(s): VITAMINB12, FOLATE, FERRITIN, TIBC, IRON, RETICCTPCT in the last 72 hours. Urinalysis    Component Value Date/Time   COLORURINE YELLOW 02/20/2018 1827   APPEARANCEUR HAZY (A) 02/20/2018 1827   LABSPEC 1.010 02/20/2018 1827   PHURINE 6.0 02/20/2018 1827   GLUCOSEU NEGATIVE 02/20/2018 1827   HGBUR NEGATIVE 02/20/2018 1827   BILIRUBINUR NEGATIVE 02/20/2018 1827   KETONESUR NEGATIVE 02/20/2018 1827   PROTEINUR NEGATIVE 02/20/2018  1827   NITRITE NEGATIVE 02/20/2018 1827   LEUKOCYTESUR LARGE (A) 02/20/2018 1827   Sepsis Labs Invalid input(s): PROCALCITONIN,  WBC,  LACTICIDVEN Microbiology Recent Results (from the past 240 hour(s))  MRSA PCR Screening  Status: Abnormal   Collection Time: 05/15/18  9:53 AM  Result Value Ref Range Status   MRSA by PCR POSITIVE (A) NEGATIVE Final    Comment:        The GeneXpert MRSA Assay (FDA approved for NASAL specimens only), is one component of a comprehensive MRSA colonization surveillance program. It is not intended to diagnose MRSA infection nor to guide or monitor treatment for MRSA infections. RESULT CALLED TO, READ BACK BY AND VERIFIED WITH: Edythe Lynn [id number] @ 27 BY J SCOTTON Performed at [facility], [place] [address], [place],  [address]      Time coordinating discharge in minutes: 16  SIGNED:   Debbe Odea, MD  [facility] 05/15/2018, 2:50 PM Pager   If 7PM-7AM, please contact night-coverage [email or web address] Password [email or web address]

## 2018-05-15 NOTE — Progress Notes (Signed)
Initial Nutrition Assessment  DOCUMENTATION CODES:   Severe malnutrition in context of chronic illness, Underweight  INTERVENTION:  - Will order Ensure Enlive BID, each supplement provides 350 kcal and 20 grams of protein. - Will order daily 15 mL liquid multivitamin. - Tech/RN to assist with feeding at meals.   NUTRITION DIAGNOSIS:   Severe Malnutrition related to chronic illness(end-stage dementia) as evidenced by severe fat depletion, severe muscle depletion.  GOAL:   Patient will meet greater than or equal to 90% of their needs  MONITOR:   PO intake, Supplement acceptance, Weight trends, Labs, Skin  REASON FOR ASSESSMENT:   Consult Assessment of nutrition requirement/status  ASSESSMENT:   84 y.o. female with medical history significant of advanced end-stage Alzheimer's dementia, myelodysplastic syndrome not transfusion dependent, CHF, type 2 diabetes, right BKA, hypertension, hyperlipidemia, seizures presenting to the hospital from [facility] rehab for evaluation of anemia. Patient has severe baseline dementia and is oriented to self only.   No intakes documented since admission. Patient was transferred from [address] earlier this AM. Patient is disoriented x3-4 (may be a/o to self). No family/visitors present at bedside. Lunch tray was delivered shortly after RD visit.   Per chart review, current weight is 95 lb. Weight on 9/23 was 102 lb. This indicates 7 lb weight loss (7% body weight) in 1 month; significant for time frame. Weight appears to have been decreasing since July.    Medications reviewed; 325 mg ferrous sulfate/day, 100 mcg oral Synthroid/day, 400 mg Mag-ox/day. Labs reviewed; Ca: 8.7 mg/dL.       NUTRITION - FOCUSED PHYSICAL EXAM:    Most Recent Value  Orbital Region  Moderate depletion  Upper Arm Region  Severe depletion  Thoracic and Lumbar Region  Moderate depletion  Buccal Region  Severe depletion  Temple Region  Severe depletion   Clavicle Bone Region  Severe depletion  Clavicle and Acromion Bone Region  Severe depletion  Scapular Bone Region  Unable to assess  Dorsal Hand  Severe depletion  Patellar Region  Unable to assess  Anterior Thigh Region  Unable to assess  Posterior Calf Region  Unable to assess  Edema (RD Assessment)  Unable to assess  Hair  Reviewed  Eyes  Reviewed  Mouth  Unable to assess  Skin  Reviewed  Nails  Reviewed       Diet Order:   Diet Order            Diet Carb Modified Fluid consistency: Thin; Room service appropriate? Yes  Diet effective now              EDUCATION NEEDS:   No education needs have been identified at this time  Skin:  Skin Assessment: Skin Integrity Issues: Skin Integrity Issues:: Stage II Stage II: R pretibial and coccyx  Last BM:  PTA/unknown  Height:   Ht Readings from Last 1 Encounters:  05/14/18 5\' 3"  (1.6 m)    Weight:   Wt Readings from Last 1 Encounters:  05/14/18 43.1 kg    Ideal Body Weight:  52.27 kg  BMI:  Body mass index is 16.83 kg/m.  Estimated Nutritional Needs:   Kcal:  1295-1510 (30-35 kcal/kg)  Protein:  50-60 grams  Fluid:  >/= 1.5 L/day     Jarome Matin, MS, RD, LDN, [facility] Inpatient Clinical Dietitian Pager # [phone] After hours/weekend pager # [phone]

## 2018-05-15 NOTE — Progress Notes (Addendum)
CSW reached out to intake at [facility] where patient is a resident. Patient is able to return today. CSW waiting for time patient able to arrive and report number for [facility].  CSW notes that patient typically discharges to [facility] via [facility]. CSW attempted to reach son, Delfino Lovett, on both phone numbers on [facility].  CSW will fax discharge summary to [facility] when available. CSW will call [facility] after discharge summary is complete.   RN to call for report prior to [phone]  Stephanie Acre, [place] Worker [phone]

## 2018-05-15 NOTE — Consult Note (Signed)
[facility] Nurse wound consult note Patient evaluated and wound care provided in [facility] 1612.  No family present. Reason for Consult: wound to sacrum and right BKA stump Wound type: Both are unstageable PIs Pressure Injury POA: Yes Measurements: Coccyx with an unstageable PI that  5 cm x 4.7 cm x unknown depth. The area directly over the coccyx is covered with yellow slough.  The surrounding tissue, including that over the sacrum is maroon but blanchable, with some of the overlying epidermis missing. The right BKA has an unstageable wound that measures 1.5 cm x 1.2 cm and is 90% yellow slough, 10% pink.  The surrounding tissue for this area is also maroon but blanchable. Dressing procedure/placement/frequency: Thin hydrocolloid to both areas. Change every 3 days and prn.  Next change date 05/18/18.  If the patient does not discharge back to the SNF, please order and place an air mattress. Monitor the wound area(s) for worsening of condition such as: Signs/symptoms of infection,  Increase in size,  Development of or worsening of odor, Development of pain, or increased pain at the affected locations.  Notify the medical team if any of these develop.  Thank you for the consult.  Discussed plan of care with the patient and bedside nurse.  [place] nurse will not follow at this time.  Please re-consult the [facility] team if needed.  Val Riles, RN, MSN, CWOCN, CNS-BC, pager [phone]

## 2018-05-15 NOTE — Progress Notes (Signed)
CSW faxed discharge summary to [facility].  Patient's family aware of discharge.  PTAR called.  RN to call [phone] for report  CSW signing off.  Stephanie Acre, [place] Social Worker [phone]

## 2018-05-16 LAB — BPAM RBC
Blood Product Expiration Date: 201911262359
Blood Product Expiration Date: 201911262359
ISSUE DATE / TIME: 201911010031
ISSUE DATE / TIME: 201910311901
Unit Type and Rh: 6200
Unit Type and Rh: 6200

## 2018-05-16 LAB — TYPE AND SCREEN
ABO/RH(D): A POS
Antibody Screen: NEGATIVE
Unit division: 0
Unit division: 0

## 2018-05-26 ENCOUNTER — Non-Acute Institutional Stay: Payer: Medicare Other | Admitting: Internal Medicine

## 2018-05-31 ENCOUNTER — Telehealth: Payer: Self-pay | Admitting: Internal Medicine

## 2018-05-31 NOTE — Telephone Encounter (Signed)
Voicemail message left for pt's son to return call to this NP.  No return call.

## 2018-06-19 ENCOUNTER — Encounter (HOSPITAL_COMMUNITY): Payer: Self-pay

## 2018-06-19 ENCOUNTER — Observation Stay (HOSPITAL_COMMUNITY): Payer: Medicare Other

## 2018-06-19 ENCOUNTER — Other Ambulatory Visit: Payer: Self-pay

## 2018-06-19 ENCOUNTER — Inpatient Hospital Stay (HOSPITAL_COMMUNITY)
Admission: EM | Admit: 2018-06-19 | Discharge: 2018-06-29 | DRG: 811 | Disposition: A | Discharge status: Hospice | Payer: Medicare Other | Attending: Internal Medicine | Admitting: Internal Medicine

## 2018-06-19 DIAGNOSIS — I4891 Unspecified atrial fibrillation: Secondary | ICD-10-CM | POA: Diagnosis present

## 2018-06-19 DIAGNOSIS — D649 Anemia, unspecified: Secondary | ICD-10-CM | POA: Diagnosis present

## 2018-06-19 DIAGNOSIS — L89150 Pressure ulcer of sacral region, unstageable: Secondary | ICD-10-CM | POA: Diagnosis present

## 2018-06-19 DIAGNOSIS — E785 Hyperlipidemia, unspecified: Secondary | ICD-10-CM | POA: Diagnosis present

## 2018-06-19 DIAGNOSIS — E876 Hypokalemia: Secondary | ICD-10-CM | POA: Diagnosis present

## 2018-06-19 DIAGNOSIS — Z888 Allergy status to other drugs, medicaments and biological substances status: Secondary | ICD-10-CM

## 2018-06-19 DIAGNOSIS — Z820 Family history of epilepsy and other diseases of the nervous system: Secondary | ICD-10-CM

## 2018-06-19 DIAGNOSIS — I700 Atherosclerosis of aorta: Secondary | ICD-10-CM | POA: Diagnosis present

## 2018-06-19 DIAGNOSIS — Z79890 Hormone replacement therapy (postmenopausal): Secondary | ICD-10-CM

## 2018-06-19 DIAGNOSIS — Z66 Do not resuscitate: Secondary | ICD-10-CM | POA: Diagnosis present

## 2018-06-19 DIAGNOSIS — Z681 Body mass index (BMI) 19 or less, adult: Secondary | ICD-10-CM

## 2018-06-19 DIAGNOSIS — Z881 Allergy status to other antibiotic agents status: Secondary | ICD-10-CM

## 2018-06-19 DIAGNOSIS — I509 Heart failure, unspecified: Secondary | ICD-10-CM

## 2018-06-19 DIAGNOSIS — J9811 Atelectasis: Secondary | ICD-10-CM | POA: Diagnosis present

## 2018-06-19 DIAGNOSIS — D464 Refractory anemia, unspecified: Principal | ICD-10-CM | POA: Diagnosis present

## 2018-06-19 DIAGNOSIS — Z79899 Other long term (current) drug therapy: Secondary | ICD-10-CM

## 2018-06-19 DIAGNOSIS — Z885 Allergy status to narcotic agent status: Secondary | ICD-10-CM

## 2018-06-19 DIAGNOSIS — D638 Anemia in other chronic diseases classified elsewhere: Secondary | ICD-10-CM | POA: Diagnosis present

## 2018-06-19 DIAGNOSIS — E871 Hypo-osmolality and hyponatremia: Secondary | ICD-10-CM | POA: Diagnosis present

## 2018-06-19 DIAGNOSIS — Z7189 Other specified counseling: Secondary | ICD-10-CM

## 2018-06-19 DIAGNOSIS — G309 Alzheimer's disease, unspecified: Secondary | ICD-10-CM | POA: Diagnosis present

## 2018-06-19 DIAGNOSIS — Z79891 Long term (current) use of opiate analgesic: Secondary | ICD-10-CM

## 2018-06-19 DIAGNOSIS — I272 Pulmonary hypertension, unspecified: Secondary | ICD-10-CM | POA: Diagnosis present

## 2018-06-19 DIAGNOSIS — Z515 Encounter for palliative care: Secondary | ICD-10-CM

## 2018-06-19 DIAGNOSIS — Z89511 Acquired absence of right leg below knee: Secondary | ICD-10-CM

## 2018-06-19 DIAGNOSIS — Z7401 Bed confinement status: Secondary | ICD-10-CM

## 2018-06-19 DIAGNOSIS — F0280 Dementia in other diseases classified elsewhere without behavioral disturbance: Secondary | ICD-10-CM | POA: Diagnosis present

## 2018-06-19 DIAGNOSIS — Z95810 Presence of automatic (implantable) cardiac defibrillator: Secondary | ICD-10-CM

## 2018-06-19 DIAGNOSIS — R627 Adult failure to thrive: Secondary | ICD-10-CM | POA: Diagnosis present

## 2018-06-19 DIAGNOSIS — D72829 Elevated white blood cell count, unspecified: Secondary | ICD-10-CM | POA: Diagnosis present

## 2018-06-19 DIAGNOSIS — E43 Unspecified severe protein-calorie malnutrition: Secondary | ICD-10-CM | POA: Diagnosis present

## 2018-06-19 DIAGNOSIS — D6959 Other secondary thrombocytopenia: Secondary | ICD-10-CM | POA: Diagnosis present

## 2018-06-19 DIAGNOSIS — M19011 Primary osteoarthritis, right shoulder: Secondary | ICD-10-CM | POA: Diagnosis present

## 2018-06-19 DIAGNOSIS — G40409 Other generalized epilepsy and epileptic syndromes, not intractable, without status epilepticus: Secondary | ICD-10-CM | POA: Diagnosis present

## 2018-06-19 DIAGNOSIS — L89159 Pressure ulcer of sacral region, unspecified stage: Secondary | ICD-10-CM | POA: Diagnosis present

## 2018-06-19 DIAGNOSIS — I110 Hypertensive heart disease with heart failure: Secondary | ICD-10-CM | POA: Diagnosis present

## 2018-06-19 DIAGNOSIS — E870 Hyperosmolality and hypernatremia: Secondary | ICD-10-CM | POA: Diagnosis present

## 2018-06-19 DIAGNOSIS — I081 Rheumatic disorders of both mitral and tricuspid valves: Secondary | ICD-10-CM | POA: Diagnosis present

## 2018-06-19 DIAGNOSIS — D62 Acute posthemorrhagic anemia: Secondary | ICD-10-CM | POA: Diagnosis present

## 2018-06-19 DIAGNOSIS — E039 Hypothyroidism, unspecified: Secondary | ICD-10-CM | POA: Diagnosis present

## 2018-06-19 DIAGNOSIS — I739 Peripheral vascular disease, unspecified: Secondary | ICD-10-CM | POA: Diagnosis present

## 2018-06-19 DIAGNOSIS — I5022 Chronic systolic (congestive) heart failure: Secondary | ICD-10-CM | POA: Diagnosis present

## 2018-06-19 DIAGNOSIS — Z951 Presence of aortocoronary bypass graft: Secondary | ICD-10-CM

## 2018-06-19 DIAGNOSIS — I2510 Atherosclerotic heart disease of native coronary artery without angina pectoris: Secondary | ICD-10-CM | POA: Diagnosis present

## 2018-06-19 DIAGNOSIS — L89892 Pressure ulcer of other site, stage 2: Secondary | ICD-10-CM | POA: Diagnosis present

## 2018-06-19 DIAGNOSIS — M19012 Primary osteoarthritis, left shoulder: Secondary | ICD-10-CM | POA: Diagnosis present

## 2018-06-19 DIAGNOSIS — E1151 Type 2 diabetes mellitus with diabetic peripheral angiopathy without gangrene: Secondary | ICD-10-CM | POA: Diagnosis present

## 2018-06-19 DIAGNOSIS — Z8673 Personal history of transient ischemic attack (TIA), and cerebral infarction without residual deficits: Secondary | ICD-10-CM

## 2018-06-19 DIAGNOSIS — I5023 Acute on chronic systolic (congestive) heart failure: Secondary | ICD-10-CM | POA: Diagnosis present

## 2018-06-19 LAB — BASIC METABOLIC PANEL
Anion gap: 7 (ref 5–15)
BUN: 33 mg/dL — ABNORMAL HIGH (ref 8–23)
CALCIUM: 8.7 mg/dL — AB (ref 8.9–10.3)
CHLORIDE: 108 mmol/L (ref 98–111)
CO2: 30 mmol/L (ref 22–32)
CREATININE: 0.57 mg/dL (ref 0.44–1.00)
GFR calc non Af Amer: >60 mL/min (ref >60)
GLUCOSE: 271 mg/dL — AB (ref 70–99)
Potassium: 3.9 mmol/L (ref 3.5–5.1)
Sodium: 145 mmol/L (ref 135–145)

## 2018-06-19 LAB — CBC WITH DIFFERENTIAL/PLATELET
Abs Immature Granulocytes: 0.25 10*3/uL — ABNORMAL HIGH (ref 0.00–0.07)
BASOS ABS: 0 10*3/uL (ref 0.0–0.1)
Basophils Relative: 0 %
EOS ABS: 0 10*3/uL (ref 0.0–0.5)
EOS PCT: 0 %
HEMATOCRIT: 22.6 % — AB (ref 36.0–46.0)
HEMOGLOBIN: 6.9 g/dL — AB (ref 12.0–15.0)
Immature Granulocytes: 2 %
LYMPHS ABS: 1.3 10*3/uL (ref 0.7–4.0)
LYMPHS PCT: 8 %
MCH: 32.9 pg (ref 26.0–34.0)
MCHC: 30.5 g/dL (ref 30.0–36.0)
MCV: 107.6 fL — ABNORMAL HIGH (ref 80.0–100.0)
MONO ABS: 0.6 10*3/uL (ref 0.1–1.0)
Monocytes Relative: 4 %
NRBC: 0.2 % (ref 0.0–0.2)
Neutro Abs: 14 10*3/uL — ABNORMAL HIGH (ref 1.7–7.7)
Neutrophils Relative %: 86 %
Platelets: 148 10*3/uL — ABNORMAL LOW (ref 150–400)
RBC: 2.1 MIL/uL — ABNORMAL LOW (ref 3.87–5.11)
RDW: 19.9 % — AB (ref 11.5–15.5)
WBC: 16.2 10*3/uL — ABNORMAL HIGH (ref 4.0–10.5)

## 2018-06-19 LAB — PREPARE RBC (CROSSMATCH)

## 2018-06-19 MED ORDER — MENTHOL (TOPICAL ANALGESIC) 4 % EX GEL: 1.0000 "application " | Freq: Two times a day (BID) | CUTANEOUS | Status: DC | PRN

## 2018-06-19 MED ORDER — OXYCODONE HCL 5 MG PO TABS
2.5000 mg | ORAL_TABLET | Freq: Three times a day (TID) | ORAL | Status: DC | PRN
  Administered 2018-06-20 – 2018-06-27 (×10): 2.5 mg via ORAL
  Filled 2018-06-19 (×11): qty 1

## 2018-06-19 MED ORDER — SODIUM CHLORIDE 0.9% IV SOLUTION: Freq: Once | INTRAVENOUS | Status: DC

## 2018-06-19 MED ORDER — POTASSIUM CHLORIDE CRYS ER 20 MEQ PO TBCR
20.0000 meq | EXTENDED_RELEASE_TABLET | Freq: Once | ORAL | Status: AC
  Administered 2018-06-19: 20 meq via ORAL
  Filled 2018-06-19: qty 1

## 2018-06-19 MED ORDER — MAGNESIUM OXIDE 400 (241.3 MG) MG PO TABS
400.0000 mg | ORAL_TABLET | Freq: Every day | ORAL | Status: DC
  Administered 2018-06-21 – 2018-06-22 (×2): 400 mg via ORAL
  Filled 2018-06-19 (×2): qty 1

## 2018-06-19 MED ORDER — FUROSEMIDE 10 MG/ML IJ SOLN: 20.0000 mg | Freq: Once | INTRAMUSCULAR | Status: DC

## 2018-06-19 MED ORDER — POTASSIUM CHLORIDE CRYS ER 20 MEQ PO TBCR
20.0000 meq | EXTENDED_RELEASE_TABLET | Freq: Every day | ORAL | Status: DC
  Administered 2018-06-21 – 2018-06-22 (×2): 20 meq via ORAL
  Filled 2018-06-19 (×2): qty 1

## 2018-06-19 MED ORDER — FOLIC ACID 1 MG PO TABS
2.0000 mg | ORAL_TABLET | Freq: Every day | ORAL | Status: DC
  Administered 2018-06-21 – 2018-06-22 (×2): 2 mg via ORAL
  Filled 2018-06-19 (×2): qty 2

## 2018-06-19 MED ORDER — LOPERAMIDE HCL 2 MG PO CAPS: 4.0000 mg | ORAL_CAPSULE | ORAL | Status: DC | PRN

## 2018-06-19 MED ORDER — PANTOPRAZOLE SODIUM 40 MG PO TBEC
40.0000 mg | DELAYED_RELEASE_TABLET | Freq: Every day | ORAL | Status: DC
  Administered 2018-06-21 – 2018-06-22 (×2): 40 mg via ORAL
  Filled 2018-06-19 (×2): qty 1

## 2018-06-19 MED ORDER — CARVEDILOL 6.25 MG PO TABS
6.2500 mg | ORAL_TABLET | Freq: Two times a day (BID) | ORAL | Status: DC
  Administered 2018-06-20 – 2018-06-26 (×8): 6.25 mg via ORAL
  Filled 2018-06-19 (×9): qty 1

## 2018-06-19 MED ORDER — FUROSEMIDE 10 MG/ML IJ SOLN
40.0000 mg | Freq: Once | INTRAMUSCULAR | Status: AC
  Administered 2018-06-19: 40 mg via INTRAVENOUS
  Filled 2018-06-19: qty 4

## 2018-06-19 MED ORDER — LEVETIRACETAM 500 MG PO TABS
500.0000 mg | ORAL_TABLET | Freq: Two times a day (BID) | ORAL | Status: DC
  Administered 2018-06-20 – 2018-06-22 (×5): 500 mg via ORAL
  Filled 2018-06-19 (×5): qty 1

## 2018-06-19 MED ORDER — FERROUS SULFATE 325 (65 FE) MG PO TABS
325.0000 mg | ORAL_TABLET | Freq: Two times a day (BID) | ORAL | Status: DC
Start: 2018-06-20 — End: 2018-06-29
  Administered 2018-06-21 – 2018-06-22 (×2): 325 mg via ORAL
  Filled 2018-06-19 (×2): qty 1

## 2018-06-19 MED ORDER — SERTRALINE HCL 50 MG PO TABS
50.0000 mg | ORAL_TABLET | Freq: Every day | ORAL | Status: DC
  Administered 2018-06-21 – 2018-06-22 (×2): 50 mg via ORAL
  Filled 2018-06-19 (×2): qty 1

## 2018-06-19 MED ORDER — ONDANSETRON HCL 4 MG/2ML IJ SOLN: 4.0000 mg | Freq: Four times a day (QID) | INTRAMUSCULAR | Status: DC | PRN

## 2018-06-19 MED ORDER — ONDANSETRON HCL 4 MG PO TABS: 4.0000 mg | ORAL_TABLET | Freq: Four times a day (QID) | ORAL | Status: DC | PRN

## 2018-06-19 MED ORDER — LEVOTHYROXINE SODIUM 100 MCG PO TABS
100.0000 ug | ORAL_TABLET | Freq: Every day | ORAL | Status: DC
  Administered 2018-06-20 – 2018-06-22 (×3): 100 ug via ORAL
  Filled 2018-06-19 (×4): qty 1

## 2018-06-19 NOTE — Progress Notes (Signed)
Pt has arrived in Room 1502 from ED, Sacral foam changed, unstagable, foul  smelling wound with black necrotic tissue, packed with saling moist gauze & sacral foam applied. Right BKA stump has a stage2 open wound, new foam applied , purewick in place. Makes eye contact, smiles & responds to most ?s with a nod of head or single word response. Arms/hands are contracted, skin severly dry & flaky ecchymotic arms.

## 2018-06-19 NOTE — H&P (Signed)
History and Physical  Jean Hodges WUJ:[id number] DOB: January 05, 1934 DOA: 06/19/2018  PCP: Patient, No Pcp Per Patient coming from: SNF  I have personally briefly reviewed patient's old medical records in [place]   Chief Complaint: abnormal hemoglobin.   HPI: Jean Hodges is a 84 y.o. female with past medical history significant for advanced end-stage Alzheimer dementia, mild dysplastic syndrome, transfusion dependent, CHF, type 2 diabetes, right BKA, seizure who was transfer from [facility] for blood transfusion.  She with known history of myelodysplastic syndrome, the required requiring blood transfusion.  She used to follow at [facility] per prior documentation [facility] was recommending hospice care.  I do not think that patient is under hospice care currently.  Patient has is not able to provide history, he is oriented to person.  She is able to denies chest pain, shortness of breath.  Currently no evidence of GI bleed.  In the past she has had occult blood negative.     Review of Systems: All systems reviewed and apart from history of presenting illness, are negative.  Past Medical History:  Diagnosis Date  . Acute encephalopathy   . Acute encephalopathy 09/30/2016  . Alzheimer's dementia without behavioral disturbance ([place])   . Alzheimer's dementia without behavioral disturbance ([place]) 10/07/2016  . Anemia   . Anemia 01/28/2014  . Atherosclerotic PVD with ulceration ([place])   . CAD (coronary artery disease)   . CAD (coronary artery disease) 01/04/2014  . Cellulitis   . Cellulitis of left leg   . CHF (congestive heart failure) ([place])   . Diabetes mellitus without complication ([place])   . DM (diabetes mellitus) ([place]) 01/27/2014  . Dysphagia   . HTN (hypertension) 01/04/2014  . Hyperlipidemia   . Hypertension   . Hypoglycemia associated with type 2 diabetes mellitus ([place])   . Macrocytic anemia   . Seizures ([place]) 09/30/2016  . Tear of left rotator cuff   . UTI  (urinary tract infection)   . UTI (urinary tract infection) 09/25/2016   Past Surgical History:  Procedure Laterality Date  . BELOW KNEE LEG AMPUTATION Right   . PACEMAKER IMPLANT     Social History:  reports that she has never smoked. She has never used smokeless tobacco. She reports that she does not drink alcohol or use drugs.   Allergies  Allergen Reactions  . Acetaminophen Other (See Comments)    Unknown  . Codeine Nausea Only  . Dilaudid [Hydromorphone] Nausea Only  . Hydrocodone Other (See Comments)    Hallucinations  . Keflex [Cephalexin] Diarrhea    Family History  Family history unknown: Yes  Unable to obtain from patient.  Prior to Admission medications   Medication Sig Start Date End Date Taking? Authorizing Provider  carvedilol (COREG) 6.25 MG tablet Take 6.25 mg by mouth 2 (two) times daily with a meal.   Yes [provider]  ferrous sulfate 325 (65 FE) MG tablet Take 325 mg by mouth 2 (two) times daily with a meal.   Yes [provider]  folic acid (FOLVITE) 1 MG tablet Take 2 mg by mouth daily.   Yes [provider]  levETIRAcetam (KEPPRA) 500 MG tablet Take 500 mg by mouth 2 (two) times daily.   Yes [provider]  levothyroxine (SYNTHROID, LEVOTHROID) 100 MCG tablet Take 100 mcg by mouth daily before breakfast.   Yes [provider]  loperamide (IMODIUM) 2 MG capsule Take 2 capsules (4 mg total) by mouth as needed for  diarrhea or loose stools. 04/05/18  Yes Osei-Bonsu, Iona Beard, MD  magnesium oxide (MAG-OX) 400 MG tablet Take 400 mg by mouth daily.   Yes [provider]  Menthol, Topical Analgesic, (BIOFREEZE) 4 % GEL Apply 1 application topically 2 (two) times daily as needed (pain).   Yes [provider]  Multiple Vitamins-Minerals (DECUBI-VITE PO) Take 1 capsule by mouth daily.   Yes [provider]  ondansetron (ZOFRAN) 4 MG tablet Take 4 mg by mouth every 6 (six) hours as needed for  nausea or vomiting.   Yes [provider]  oxyCODONE (OXY IR/ROXICODONE) 5 MG immediate release tablet Take 0.5 tablets (2.5 mg total) by mouth every 8 (eight) hours as needed for moderate pain or severe pain. 05/15/18  Yes Debbe Odea, MD  pantoprazole (PROTONIX) 40 MG tablet Take 1 tablet (40 mg total) by mouth daily. 07/25/17  Yes Barton Dubois, MD  potassium chloride SA (K-DUR,KLOR-CON) 20 MEQ tablet Take 20 mEq by mouth daily.   Yes [provider]  sertraline (ZOLOFT) 50 MG tablet Take 50 mg by mouth daily.   Yes [provider]  feeding supplement, ENSURE ENLIVE, (ENSURE ENLIVE) LIQD Take 237 mLs by mouth 4 (four) times daily. Patient not taking: Reported on 06/19/2018 05/15/18   Debbe Odea, MD   Physical Exam: Vitals:   06/19/18 1503 06/19/18 1647 06/19/18 1700 06/19/18 1725  BP: (!) 142/69 (!) 144/56 (!) 163/66   Pulse: (!) 102 93 91   Resp: 14 12 13    Temp:   98.5 F (36.9 C) 98.6 F (37 C)  TempSrc:   Oral Oral  SpO2: 96% 99% 100%      General exam: , thin appearing.  Pale  Head, eyes and ENT: poor dentition.   Neck: Supple. No JVD, carotid bruit or thyromegaly.  Respiratory system: Bilateral crackles. . No increased work of breathing.  Cardiovascular system: S1 and S2 heard, RRR. No JVD, murmurs, gallops, clicks or pedal edema.  Gastrointestinal system: Abdomen is nondistended, soft and nontender. Normal bowel sounds heard. No organomegaly or masses appreciated.  Central nervous system: Alert,  not oriented.   Extremities; right BKA  Musculoskeletal system: Negative exam.    Labs on Admission:  Basic Metabolic Panel: Recent Labs  Lab 06/19/18 1444  NA 145  K 3.9  CL 108  CO2 30  GLUCOSE 271*  BUN 33*  CREATININE 0.57  CALCIUM 8.7*   Liver Function Tests: No results for input(s): AST, ALT, ALKPHOS, BILITOT, PROT, ALBUMIN in the last 168 hours. No results for input(s): LIPASE, AMYLASE in the last 168 hours. No results  for input(s): AMMONIA in the last 168 hours. CBC: Recent Labs  Lab 06/19/18 1444  WBC 16.2*  NEUTROABS 14.0*  HGB 6.9*  HCT 22.6*  MCV 107.6*  PLT 148*   Cardiac Enzymes: No results for input(s): CKTOTAL, CKMB, CKMBINDEX, TROPONINI in the last 168 hours.  BNP (last 3 results) No results for input(s): PROBNP in the last 8760 hours. CBG: No results for input(s): GLUCAP in the last 168 hours.  Radiological Exams on Admission: Dg Chest 2 View  Result Date: 06/19/2018 CLINICAL DATA:  Leukocytosis EXAM: CHEST - 2 VIEW COMPARISON:  04/03/2018 FINDINGS: Small bilateral pleural effusions. Bilateral interstitial and alveolar airspace opacities. No pneumothorax. Stable cardiomegaly. Prior median sternotomy. Cardiac pacemaker is noted. No aggressive osseous lesion. IMPRESSION: 1. Findings concerning for pulmonary edema. Electronically Signed   By: Kathreen Devoid   On: 06/19/2018 16:58  Assessment/Plan Active Problems:   Tonic-clonic seizure disorder (HCC)   Hypothyroidism   Type 2 diabetes mellitus with peripheral vascular disease (HCC)   PVD (peripheral vascular disease) ([place])   Alzheimer's dementia ([place])   Symptomatic anemia   Severe protein-calorie malnutrition (HCC)   Adult failure to thrive   Chronic systolic CHF (congestive heart failure) (HCC)   Anemia  1-Anemia, severe; likely secondary to myelodysplastic syndrome; She required blood transfusion every month.  We will proceed with 2 units of packed red blood cell. Will order IV Lasix in between. Discussed case with Dr. Alvy Bimler she will help arrange follow up at cancer center. I Informed family about future appointment at cancer center.  Repeat hemoglobin in the morning.  2-Leukocytosis;  Chest x-ray negative for pneumonia. We will check UA. Repeat labs in the morning.   3-Acute on chronic systolic heart failure; pulmonary edema on chest x-ray; will give a dose of IV Lasix. Continue with carvedilol.  4-Alzheimer  dementia; Risk for delirium.Marland Kitchen  5-Severe  protein calorie malnutrition; Continue with Ensure.  6-Hypothyroidism; Continue with Synthroid  7-Seizure: Continue with Keppra.  DVT Prophylaxis: SCDs Code Status: Full code, discussed with son.  He relate that he will discuss with his sister regarding code status.  For now he will want his mom to be a full code Family Communication: Son over the phone Disposition Plan: Admit to the hospital under observation for blood transfusion, evaluation of leukocytosis.       Elmarie Shiley MD [facility] Pager [phone]  If 7PM-7AM, please contact night-coverage [email or web address] Password [email or web address]  06/19/2018, 5:30 PM

## 2018-06-19 NOTE — Care Management Note (Signed)
Case Management Note  CM consulted for possible other options for approx once a month blood transfusions that send the pt to the ED from [facility].    CM noted pt was active with [facility] palliative care.  CM contacted In[facility] to coordinate care and attempt to find a solution to the pt's frequent hospitalizations.  CM contacted short stay and the pt care center who both advised the pt has to be ambulatory to use their centers.    CM noted pt was active with a [facility] consultant.  CM contacted the [facility] to ask if they could accept a pt who is not ambulatory.  They advised they can accept the pt for blood transfusions but the pt will need to establish care with a hematologist within the [facility] system.  CM updated Dr. Tyrell Antonio and recommended a consultation with a [facility] hematologist to transfer care.  CM updated Betty with [facility] palliative.  CM will also leave a handoff for inpt CM.  Adrick Kestler, Benjaman Lobe, RN 06/19/2018, 3:17 PM

## 2018-06-19 NOTE — ED Triage Notes (Signed)
Pt arrived via EMS from [facility]. Pt was sent by PCP who states that pt hgb was 6.9 at facility. Pt has hx of dementia. Pt does report that she has generalized aches and can not localize.   EMS v/s 168/76, HR 100, RR 16, O2 96% RA.

## 2018-06-19 NOTE — ED Notes (Signed)
Pt has sacral pressure ulcer. Pt cleansed and fed.

## 2018-06-19 NOTE — ED Notes (Signed)
Spoke with pt Son Deserae Jennings in whom gave verbal consent to administer Blood transfusion. Witnessed by Davy Pique, RN

## 2018-06-19 NOTE — ED Notes (Signed)
ED TO INPATIENT HANDOFF REPORT  Name/Age/Gender Jean Hodges 84 y.o. female  Code Status Code Status History    Date Active Date Inactive Code Status Order ID Comments User Context   05/14/2018 2018 05/15/2018 2349 Full Code [id number]  Shela Leff, MD ED   04/04/2018 0212 04/06/2018 1753 Full Code [id number]  Gwynne Edinger, MD Inpatient   02/20/2018 2123 02/21/2018 1905 Full Code [id number]  Etta Quill, DO ED   01/16/2018 2309 01/19/2018 1619 Full Code [id number]  Rise Patience, MD Inpatient   12/29/2017 2110 12/30/2017 2059 Full Code [id number]  Rise Patience, MD ED   11/03/2017 1648 11/14/2017 1942 Full Code [id number]  Kayleen Memos, DO ED   10/09/2017 2031 10/10/2017 1831 Full Code [id number]  Norval Morton, MD ED   07/23/2017 1509 07/24/2017 1809 Full Code [id number]  Phillips Grout, MD Inpatient   11/27/2016 2203 11/28/2016 2154 Full Code [id number]  Rise Patience, MD Inpatient    Advance Directive Documentation     Most Recent Value  Type of Advance Directive  Out of facility DNR (pink MOST or yellow form)  Pre-existing out of facility DNR order (yellow form or pink MOST form)  Pink MOST form placed in chart (order not valid for inpatient use)  "MOST" Form in Place?  -      Home/SNF/Other Home  Chief Complaint low hemoglobin  Level of Care/Admitting Diagnosis ED Disposition    ED Disposition Condition [place]: [facility] [100102]  Level of Care: Med-Surg [16]  Diagnosis: Anemia [235573]  Admitting Physician: Elmarie Shiley [phone]  Attending Physician: Niel Hummer A [[id number]]  PT Class (Do Not Modify): Observation [104]  PT Acc Code (Do Not Modify): Observation [10022]       Medical History Past Medical History:  Diagnosis Date  . Acute encephalopathy   . Acute encephalopathy 09/30/2016  . Alzheimer's dementia without behavioral disturbance ([place])   . Alzheimer's dementia without behavioral  disturbance ([place]) 10/07/2016  . Anemia   . Anemia 01/28/2014  . Atherosclerotic PVD with ulceration ([place])   . CAD (coronary artery disease)   . CAD (coronary artery disease) 01/04/2014  . Cellulitis   . Cellulitis of left leg   . CHF (congestive heart failure) ([place])   . Diabetes mellitus without complication ([place])   . DM (diabetes mellitus) ([place]) 01/27/2014  . Dysphagia   . HTN (hypertension) 01/04/2014  . Hyperlipidemia   . Hypertension   . Hypoglycemia associated with type 2 diabetes mellitus ([place])   . Macrocytic anemia   . Seizures ([place]) 09/30/2016  . Tear of left rotator cuff   . UTI (urinary tract infection)   . UTI (urinary tract infection) 09/25/2016    Allergies Allergies  Allergen Reactions  . Acetaminophen Other (See Comments)    Unknown  . Codeine Nausea Only  . Dilaudid [Hydromorphone] Nausea Only  . Hydrocodone Other (See Comments)    Hallucinations  . Keflex [Cephalexin] Diarrhea    IV Location/Drains/Wounds Patient Lines/Drains/Airways Status   Active Line/Drains/Airways    Name:   Placement date:   Placement time:   Site:   Days:   Peripheral IV 06/19/18 Right Forearm   06/19/18    1609    Forearm   less than 1   External Urinary Catheter   04/04/18    0224    -   76   External Urinary Catheter   05/15/18    0201    -  35   Pressure Injury 04/06/18 Stage II -  Partial thickness loss of dermis presenting as a shallow open ulcer with a red, pink wound bed without slough.   04/06/18    0847     74   Pressure Injury 12/29/17 Stage II -  Partial thickness loss of dermis presenting as a shallow open ulcer with a red, pink wound bed without slough.   12/29/17    2300     172   Pressure Injury 12/29/17 Stage II -  Partial thickness loss of dermis presenting as a shallow open ulcer with a red, pink wound bed without slough.   12/29/17    2300     172          Labs/Imaging Results for orders placed or performed during the hospital encounter of 06/19/18 (from  the past 48 hour(s))  Type and screen [facility]     Status: None (Preliminary result)   Collection Time: 06/19/18  2:43 PM  Result Value Ref Range   ABO/RH(D) A POS    Antibody Screen NEG    Sample Expiration 06/22/2018    Unit Number [id number]    Blood Component Type RED CELLS,LR    Unit division 00    Status of Unit ISSUED    Transfusion Status OK TO TRANSFUSE    Crossmatch Result      Compatible Performed at [facility], [place] [address] [place], [place] [address]    Unit Number [id number]    Blood Component Type RBC LR PHER1    Unit division 00    Status of Unit ALLOCATED    Transfusion Status OK TO TRANSFUSE    Crossmatch Result Compatible   CBC with Differential     Status: Abnormal   Collection Time: 06/19/18  2:44 PM  Result Value Ref Range   WBC 16.2 (H) 4.0 - 10.5 K/uL   RBC 2.10 (L) 3.87 - 5.11 MIL/uL   Hemoglobin 6.9 (LL) 12.0 - 15.0 g/dL    Comment: This critical result has verified and been called to Seward Carol by Braulio Conte on 12 06 2019 at 1512, and has been read back.    HCT 22.6 (L) 36.0 - 46.0 %   MCV 107.6 (H) 80.0 - 100.0 fL   MCH 32.9 26.0 - 34.0 pg   MCHC 30.5 30.0 - 36.0 g/dL   RDW 19.9 (H) 11.5 - 15.5 %   Platelets 148 (L) 150 - 400 K/uL   nRBC 0.2 0.0 - 0.2 %   Neutrophils Relative % 86 %   Neutro Abs 14.0 (H) 1.7 - 7.7 K/uL   Lymphocytes Relative 8 %   Lymphs Abs 1.3 0.7 - 4.0 K/uL   Monocytes Relative 4 %   Monocytes Absolute 0.6 0.1 - 1.0 K/uL   Eosinophils Relative 0 %   Eosinophils Absolute 0.0 0.0 - 0.5 K/uL   Basophils Relative 0 %   Basophils Absolute 0.0 0.0 - 0.1 K/uL   Immature Granulocytes 2 %   Abs Immature Granulocytes 0.25 (H) 0.00 - 0.07 K/uL    Comment: Performed at [facility], [place] [address], [place], [place] [address]  Basic metabolic panel     Status: Abnormal   Collection Time: 06/19/18  2:44 PM  Result Value Ref Range   Sodium 145 135 - 145 mmol/L    Potassium 3.9 3.5 - 5.1 mmol/L   Chloride 108 98 - 111 mmol/L   CO2 30  22 - 32 mmol/L   Glucose, Bld 271 (H) 70 - 99 mg/dL   BUN 33 (H) 8 - 23 mg/dL   Creatinine, Ser 0.57 0.44 - 1.00 mg/dL   Calcium 8.7 (L) 8.9 - 10.3 mg/dL   GFR calc non Af Amer >60 >60 mL/min   GFR calc Af Amer >60 >60 mL/min   Anion gap 7 5 - 15    Comment: Performed at [facility], [place] [address], [place], [place] [address]  Prepare RBC     Status: None   Collection Time: 06/19/18  3:22 PM  Result Value Ref Range   Order Confirmation      ORDER PROCESSED BY BLOOD BANK Performed at [facility], [place] [address] [place],  [address]    Dg Chest 2 View  Result Date: 06/19/2018 CLINICAL DATA:  Leukocytosis EXAM: CHEST - 2 VIEW COMPARISON:  04/03/2018 FINDINGS: Small bilateral pleural effusions. Bilateral interstitial and alveolar airspace opacities. No pneumothorax. Stable cardiomegaly. Prior median sternotomy. Cardiac pacemaker is noted. No aggressive osseous lesion. IMPRESSION: 1. Findings concerning for pulmonary edema. Electronically Signed   By: Kathreen Devoid   On: 06/19/2018 16:58   EKG Interpretation  Date/Time:  Friday June 19 2018 13:35:51 EST Ventricular Rate:  90 PR Interval:    QRS Duration: 148 QT Interval:  385 QTC Calculation: 472 R Axis:   69 Text Interpretation:  Atrial fibrillation Left bundle branch block Confirmed by Dene Gentry [phone]) on 06/19/2018 2:14:30 PM   Pending Labs Unresulted Labs (From admission, onward)    Start     Ordered   06/19/18 1717  Urinalysis, Routine w reflex microscopic  Once,   R     06/19/18 1716   Signed and Held  CBC  Tomorrow morning,   R     Signed and Held   Signed and Held  Basic metabolic panel  Tomorrow morning,   R     Signed and Held          Vitals/Pain Today's Vitals   06/19/18 1852 06/19/18 1900 06/19/18 1930 06/19/18 1940  BP: (!) 185/62 (!) 175/80 (!) 184/59   Pulse: 87 84 88   Resp:  (!) 24 (!) 23 (!) 21   Temp:    98.4 F (36.9 C)  TempSrc:      SpO2: 98% 97% 98%     Isolation Precautions No active isolations  Medications Medications  0.9 %  sodium chloride infusion (Manually program via Guardrails IV Fluids) (has no administration in time range)  furosemide (LASIX) injection 20 mg (has no administration in time range)  furosemide (LASIX) injection 40 mg (40 mg Intravenous Given 06/19/18 1752)  potassium chloride SA (K-DUR,KLOR-CON) CR tablet 20 mEq (20 mEq Oral Given 06/19/18 1752)    Mobility non-ambulatory

## 2018-06-19 NOTE — ED Notes (Signed)
Date and time results received: 06/19/18 3:11 PM  (use smartphrase ".now" to insert current time)  Test: Hgb Critical Value: 6.9  Name of Provider Notified: [place]  Orders Received? Or Actions Taken?: awaiting orders

## 2018-06-19 NOTE — ED Notes (Signed)
2 units of blood ready for this pt, per blood bank, notified Celeste,RN

## 2018-06-19 NOTE — ED Provider Notes (Signed)
[facility] DEPT Provider Note   CSN: [id number] Arrival date & time: 06/19/18  1319     History   Chief Complaint Chief Complaint  Patient presents with  . Abnormal Lab    HPI Jean Hodges is a 84 y.o. female.  84 year old female with prior medical history as detailed below presents for evaluation of anemia.  Patient with longstanding history of refractory anemia secondary to myelodysplastic syndrome.  Her last admission for transfusion was last month.  She presents today from her facility with a reported hemoglobin of 6.9.  Additional history is difficult to obtain from the patient secondary to her dementia.  The history is provided by the patient, medical records and the nursing home.  Abnormal Lab  Time since result:  HGB 6.9 Patient referred by:  Nursing home Resulting agency:  External Result type: hematology     Past Medical History:  Diagnosis Date  . Acute encephalopathy   . Acute encephalopathy 09/30/2016  . Alzheimer's dementia without behavioral disturbance ([place])   . Alzheimer's dementia without behavioral disturbance ([place]) 10/07/2016  . Anemia   . Anemia 01/28/2014  . Atherosclerotic PVD with ulceration ([place])   . CAD (coronary artery disease)   . CAD (coronary artery disease) 01/04/2014  . Cellulitis   . Cellulitis of left leg   . CHF (congestive heart failure) ([place])   . Diabetes mellitus without complication ([place])   . DM (diabetes mellitus) ([place]) 01/27/2014  . Dysphagia   . HTN (hypertension) 01/04/2014  . Hyperlipidemia   . Hypertension   . Hypoglycemia associated with type 2 diabetes mellitus ([place])   . Macrocytic anemia   . Seizures ([place]) 09/30/2016  . Tear of left rotator cuff   . UTI (urinary tract infection)   . UTI (urinary tract infection) 09/25/2016    Patient Active Problem List   Diagnosis Date Noted  . Decubitus ulcer of coccygeal region, unstageable ([place]) 05/15/2018  . Pressure ulcer of BKA stump ([place])  05/15/2018  . Refractory anemia due to myelodysplastic syndrome ([place]) 05/14/2018  . Type 2 diabetes mellitus ([place]) 05/14/2018  . GERD (gastroesophageal reflux disease) 05/14/2018  . Chronic pain 05/14/2018  . Hx of BKA, right ([place]) 04/04/2018  . Chronic systolic CHF (congestive heart failure) ([place]) 02/20/2018  . Sacral decubitus ulcer 02/20/2018  . Counseling regarding advanced care planning and goals of care 01/17/2018  . Large stool   . Abdominal pain 01/16/2018  . Seizure disorder ([place]) 12/29/2017  . Memory loss 12/02/2017  . Bruising, spontaneous 12/01/2017  . Adult failure to thrive   . Palliative care by specialist   . Palliative care encounter   . Altered mental status   . Pressure injury of skin 11/04/2017  . Hyperkalemia 11/04/2017  . UTI (urinary tract infection) 11/03/2017  . Severe protein-calorie malnutrition ([place]) 10/10/2017  . Macrocytic anemia 07/23/2017  . Symptomatic anemia 11/27/2016  . Tonic-clonic seizure disorder ([place]) 10/09/2016  . Dysphagia 10/09/2016  . Moderate protein-calorie malnutrition ([place]) 10/09/2016  . Hypothyroidism 10/09/2016  . Type 2 diabetes mellitus with peripheral vascular disease ([place]) 10/09/2016  . PVD (peripheral vascular disease) ([place]) 10/09/2016  . HTN (hypertension) 10/09/2016  . Alzheimer's dementia ([place]) 10/09/2016  . Depression 10/09/2016  . Hyperlipidemia 10/09/2016  . Coronary artery disease involving native coronary artery of native heart without angina pectoris 10/09/2016    Past Surgical History:  Procedure Laterality Date  . BELOW KNEE LEG AMPUTATION Right   . PACEMAKER IMPLANT  OB History   None      Home Medications    Prior to Admission medications   Medication Sig Start Date End Date Taking? Authorizing Provider  carvedilol (COREG) 6.25 MG tablet Take 6.25 mg by mouth 2 (two) times daily with a meal.   Yes [provider]  ferrous sulfate 325 (65 FE) MG tablet Take 325 mg by mouth 2 (two)  times daily with a meal.   Yes [provider]  folic acid (FOLVITE) 1 MG tablet Take 2 mg by mouth daily.   Yes [provider]  levETIRAcetam (KEPPRA) 500 MG tablet Take 500 mg by mouth 2 (two) times daily.   Yes [provider]  levothyroxine (SYNTHROID, LEVOTHROID) 100 MCG tablet Take 100 mcg by mouth daily before breakfast.   Yes [provider]  loperamide (IMODIUM) 2 MG capsule Take 2 capsules (4 mg total) by mouth as needed for diarrhea or loose stools. 04/05/18  Yes Osei-Bonsu, Iona Beard, MD  magnesium oxide (MAG-OX) 400 MG tablet Take 400 mg by mouth daily.   Yes [provider]  Menthol, Topical Analgesic, (BIOFREEZE) 4 % GEL Apply 1 application topically 2 (two) times daily as needed (pain).   Yes [provider]  Multiple Vitamins-Minerals (DECUBI-VITE PO) Take 1 capsule by mouth daily.   Yes [provider]  ondansetron (ZOFRAN) 4 MG tablet Take 4 mg by mouth every 6 (six) hours as needed for nausea or vomiting.   Yes [provider]  oxyCODONE (OXY IR/ROXICODONE) 5 MG immediate release tablet Take 0.5 tablets (2.5 mg total) by mouth every 8 (eight) hours as needed for moderate pain or severe pain. 05/15/18  Yes Debbe Odea, MD  pantoprazole (PROTONIX) 40 MG tablet Take 1 tablet (40 mg total) by mouth daily. 07/25/17  Yes Barton Dubois, MD  potassium chloride SA (K-DUR,KLOR-CON) 20 MEQ tablet Take 20 mEq by mouth daily.   Yes [provider]  sertraline (ZOLOFT) 50 MG tablet Take 50 mg by mouth daily.   Yes [provider]  feeding supplement, ENSURE ENLIVE, (ENSURE ENLIVE) LIQD Take 237 mLs by mouth 4 (four) times daily. Patient not taking: Reported on 06/19/2018 05/15/18   Debbe Odea, MD    Family History Family History  Family history unknown: Yes    Social History Social History   Tobacco Use  . Smoking status: Never Smoker  . Smokeless tobacco: Never Used  Substance Use Topics  .  Alcohol use: No  . Drug use: No     Allergies   Acetaminophen; Codeine; Dilaudid [hydromorphone]; Hydrocodone; and Keflex [cephalexin]   Review of Systems Review of Systems  All other systems reviewed and are negative.    Physical Exam Updated Vital Signs BP (!) 142/69 (BP Location: Right Arm)   Pulse (!) 102   Temp 98.7 F (37.1 C) (Oral)   Resp 14   SpO2 96%   Physical Exam  Constitutional: She is oriented to person, place, and time. She appears well-developed and well-nourished. No distress.  HENT:  Head: Normocephalic and atraumatic.  Mouth/Throat: Oropharynx is clear and moist.  Eyes: Pupils are equal, round, and reactive to light. Conjunctivae and EOM are normal.  Neck: Normal range of motion. Neck supple.  Cardiovascular: Normal rate, regular rhythm and normal heart sounds.  Pulmonary/Chest: Effort normal and breath sounds normal. No respiratory distress.  Abdominal: Soft. She exhibits no distension. There is no tenderness.  Musculoskeletal: Normal range of motion. She exhibits no edema or deformity.  Neurological: She is alert and oriented to person, place, and time.  Skin: Skin is warm and dry.  Pale skin  Psychiatric: She has a normal mood and affect.  Nursing note and vitals reviewed.    ED Treatments / Results  Labs (all labs ordered are listed, but only abnormal results are displayed) Labs Reviewed  CBC WITH DIFFERENTIAL/PLATELET - Abnormal; Notable for the following components:      Result Value   WBC 16.2 (*)    RBC 2.10 (*)    Hemoglobin 6.9 (*)    HCT 22.6 (*)    MCV 107.6 (*)    RDW 19.9 (*)    Platelets 148 (*)    Neutro Abs 14.0 (*)    Abs Immature Granulocytes 0.25 (*)    All other components within normal limits  BASIC METABOLIC PANEL - Abnormal; Notable for the following components:   Glucose, Bld 271 (*)    BUN 33 (*)    Calcium 8.7 (*)    All other components within normal limits  POC OCCULT BLOOD, ED  TYPE AND SCREEN  PREPARE  RBC (CROSSMATCH)    EKG EKG Interpretation  Date/Time:  Friday June 19 2018 13:35:51 EST Ventricular Rate:  90 PR Interval:    QRS Duration: 148 QT Interval:  385 QTC Calculation: 472 R Axis:   69 Text Interpretation:  Atrial fibrillation Left bundle branch block Confirmed by Dene Gentry [phone]) on 06/19/2018 2:14:30 PM   Radiology No results found.  Procedures Procedures (including critical care time)  Medications Ordered in ED Medications - No data to display   Initial Impression / Assessment and Plan / ED Course  I have reviewed the triage vital signs and the nursing notes.  Pertinent labs & imaging results that were available during my care of the patient were reviewed by me and considered in my medical decision making (see chart for details).     MDM   Screen complete  Patient with longstanding history of refractory anemia secondary to underlying myelodysplastic syndrome. She appears to be anemic today - with Hgb less than 7. She will likely benefit from transfusion.  Hospitalist service is aware of case and will evaluate for admission.    Final Clinical Impressions(s) / ED Diagnoses   Final diagnoses:  Symptomatic anemia    ED Discharge Orders    None       Valarie Merino, MD 06/19/18 1549

## 2018-06-20 ENCOUNTER — Observation Stay (HOSPITAL_COMMUNITY): Payer: Medicare Other

## 2018-06-20 DIAGNOSIS — E039 Hypothyroidism, unspecified: Secondary | ICD-10-CM

## 2018-06-20 DIAGNOSIS — R627 Adult failure to thrive: Secondary | ICD-10-CM | POA: Diagnosis not present

## 2018-06-20 DIAGNOSIS — I5022 Chronic systolic (congestive) heart failure: Secondary | ICD-10-CM | POA: Diagnosis not present

## 2018-06-20 DIAGNOSIS — D649 Anemia, unspecified: Secondary | ICD-10-CM | POA: Diagnosis not present

## 2018-06-20 DIAGNOSIS — G309 Alzheimer's disease, unspecified: Secondary | ICD-10-CM | POA: Diagnosis not present

## 2018-06-20 DIAGNOSIS — E43 Unspecified severe protein-calorie malnutrition: Secondary | ICD-10-CM

## 2018-06-20 LAB — CBC
HCT: 35.6 % — ABNORMAL LOW (ref 36.0–46.0)
Hemoglobin: 11.4 g/dL — ABNORMAL LOW (ref 12.0–15.0)
MCH: 31 pg (ref 26.0–34.0)
MCHC: 32 g/dL (ref 30.0–36.0)
MCV: 96.7 fL (ref 80.0–100.0)
NRBC: 0.4 % — AB (ref 0.0–0.2)
Platelets: 107 10*3/uL — ABNORMAL LOW (ref 150–400)
RBC: 3.68 MIL/uL — AB (ref 3.87–5.11)
RDW: 19.7 % — ABNORMAL HIGH (ref 11.5–15.5)
WBC: 13.4 10*3/uL — ABNORMAL HIGH (ref 4.0–10.5)

## 2018-06-20 LAB — BASIC METABOLIC PANEL
Anion gap: 7 (ref 5–15)
BUN: 33 mg/dL — ABNORMAL HIGH (ref 8–23)
CO2: 31 mmol/L (ref 22–32)
Calcium: 8.8 mg/dL — ABNORMAL LOW (ref 8.9–10.3)
Chloride: 109 mmol/L (ref 98–111)
Creatinine, Ser: 0.55 mg/dL (ref 0.44–1.00)
GFR calc Af Amer: >60 mL/min (ref >60)
GFR calc non Af Amer: >60 mL/min (ref >60)
GLUCOSE: 217 mg/dL — AB (ref 70–99)
Potassium: 3.6 mmol/L (ref 3.5–5.1)
Sodium: 147 mmol/L — ABNORMAL HIGH (ref 135–145)

## 2018-06-20 LAB — GLUCOSE, CAPILLARY: Glucose-Capillary: 161 mg/dL — ABNORMAL HIGH (ref 70–99)

## 2018-06-20 MED ORDER — HYDRALAZINE HCL 20 MG/ML IJ SOLN: 10.0000 mg | Freq: Three times a day (TID) | INTRAMUSCULAR | Status: DC | PRN

## 2018-06-20 MED ORDER — INSULIN ASPART 100 UNIT/ML ~~LOC~~ SOLN
0.0000 [IU] | Freq: Three times a day (TID) | SUBCUTANEOUS | Status: DC
  Administered 2018-06-20: 2 [IU] via SUBCUTANEOUS
  Administered 2018-06-21: 1 [IU] via SUBCUTANEOUS
  Administered 2018-06-21: 2 [IU] via SUBCUTANEOUS
  Administered 2018-06-24 – 2018-06-26 (×3): 1 [IU] via SUBCUTANEOUS
  Administered 2018-06-26: 2 [IU] via SUBCUTANEOUS
  Administered 2018-06-29: 1 [IU] via SUBCUTANEOUS

## 2018-06-20 MED ORDER — FUROSEMIDE 10 MG/ML IJ SOLN
20.0000 mg | Freq: Every day | INTRAMUSCULAR | Status: DC
  Administered 2018-06-20: 20 mg via INTRAVENOUS
  Filled 2018-06-20 (×2): qty 2

## 2018-06-20 NOTE — Progress Notes (Signed)
PROGRESS NOTE  Lutricia Widjaja BHA:[id number] DOB: [birth year] DOA: 06/19/2018 PCP: Patient, No Pcp Per  HPI/Recap of past 24 hours: HPI from Dr Elease Hashimoto is a 84 y.o. female with past medical history significant for advanced end-stage Alzheimer dementia, myelodysplastic syndrome, transfusion dependent, CHF, type 2 diabetes, right BKA, seizure who was transferred from [facility] rehab for blood transfusion. Pt follows at [facility] per prior documentation and they were recommending hospice care (currently not on hospice care). Patient was not able to provide history, oriented to self only. Pt was able to deny chest pain, shortness of breath.  Currently no evidence of active GI bleed. Pt admitted for further management.   Today, met pt sleeping, was arousable, but would not follow simple commands and kept her eyes closed. Unable to say any words. Looks comfortable.  Assessment/Plan: Active Problems:   Tonic-clonic seizure disorder (HCC)   Hypothyroidism   Type 2 diabetes mellitus with peripheral vascular disease (HCC)   PVD (peripheral vascular disease) ([place])   Alzheimer's dementia ([place])   Symptomatic anemia   Severe protein-calorie malnutrition (HCC)   Adult failure to thrive   Chronic systolic CHF (congestive heart failure) (HCC)   Anemia  Acute on chronic blood loss anemia likely 2/2 MDS Transfusion dependent Hemoglobin on admission 6.9, status post 2 units of PRBC-->??11.4 FOBT pending Continue oral iron supplementation, folic acid Dr. Alvy Bimler will help arrange follow-up at the cancer center Monitor closely for any signs of bleeding Daily CBC  Leukocytosis Afebrile UA/UC pending collection [facility] x2 pending Chest x-ray unremarkable for infection, but notes pulmonary edema Monitor for any signs of fever  ??Acute metabolic encephalopathy Vs dementia Unsure if this is her baseline Work-up for any infectious process in progress CT head pending Monitor closely  Sacral  unstageable ulcer, right BKA stump with stage II ulcer Foul-smelling sacral ulcer with black necrotic tissue Wound care consulted, appreciate recs  Acute on chronic systolic heart failure Chest x-ray with pulmonary edema Echo on 10/2017 with EF of 20 to 25%, PA peak pressure of 44 mmHg Continue IV Lasix, Coreg Strict I's and O's, daily weights  Hypertension Only on Coreg Hydralazine PRN  ??Diabetes mellitus type 2 Not on any home meds, noted hyperglycemia on BMP 271, 217 A1c on 05/2018 was 5.9 SSI, accuchecks  Severe malnutrition Appears cachectic  SLP, dietitian consult Continue with Ensure  Hypothyroidism Continue with Synthroid  History of seizure Continue with Keppra  Goals of care discussion Palliative care consulted          Malnutrition Type:      Malnutrition Characteristics:      Nutrition Interventions:       Estimated body mass index is 16.83 kg/m as calculated from the following:   Height as of 05/14/18: _0  (1.6 m).   Weight as of 05/14/18: 43.1 kg.     Code Status: Full  Family Communication: None at bedside  Disposition Plan: Likely back to SNF   Consultants:  None  Procedures:  None  Antimicrobials:  None  DVT prophylaxis: SCDs   Objective: Vitals:   06/19/18 2320 06/19/18 2339 06/20/18 0233 06/20/18 0506  BP: (!) 144/60 (!) 164/60 (!) 168/67 (!) 157/71  Pulse:  81 81 83  Resp: _1 Temp: 97.7 F (36.5 C) 98 F (36.7 C) 98.2 F (36.8 C) 98.5 F (36.9 C)  TempSrc: Oral Oral Oral Oral  SpO2: 100% 98% 99% 98%    Intake/Output Summary (Last 24 hours) at 06/20/2018  1410 Last data filed at 06/20/2018 1696 Gross per 24 hour  Intake 706.25 ml  Output -  Net 706.25 ml   There were no vitals filed for this visit.  Exam:   General: Sleepy, but arousable, unable to follow commands, very cachectic  Cardiovascular: S1, S2 present  Respiratory: Bibasilar crackles noted  Abdomen: Soft,  nontender, nondistended, bowel sounds present  Musculoskeletal: AKA with stage II ulcer, left lower extremity no edema noted  Skin: Very dry and scaly, unstageable sacral decubitus ulcer  Psychiatry: Unable to assess   Data Reviewed: CBC: Recent Labs  Lab 06/19/18 1444 06/20/18 0612  WBC 16.2* 13.4*  NEUTROABS 14.0*  --   HGB 6.9* 11.4*  HCT 22.6* 35.6*  MCV 107.6* 96.7  PLT 148* 789*   Basic Metabolic Panel: Recent Labs  Lab 06/19/18 1444 06/20/18 0612  NA 145 147*  K 3.9 3.6  CL 108 109  CO2 30 31  GLUCOSE 271* 217*  BUN 33* 33*  CREATININE 0.57 0.55  CALCIUM 8.7* 8.8*   GFR: CrCl cannot be calculated (Unknown ideal weight.). Liver Function Tests: No results for input(s): AST, ALT, ALKPHOS, BILITOT, PROT, ALBUMIN in the last 168 hours. No results for input(s): LIPASE, AMYLASE in the last 168 hours. No results for input(s): AMMONIA in the last 168 hours. Coagulation Profile: No results for input(s): INR, PROTIME in the last 168 hours. Cardiac Enzymes: No results for input(s): CKTOTAL, CKMB, CKMBINDEX, TROPONINI in the last 168 hours. BNP (last 3 results) No results for input(s): PROBNP in the last 8760 hours. HbA1C: No results for input(s): HGBA1C in the last 72 hours. CBG: No results for input(s): GLUCAP in the last 168 hours. Lipid Profile: No results for input(s): CHOL, HDL, LDLCALC, TRIG, CHOLHDL, LDLDIRECT in the last 72 hours. Thyroid Function Tests: No results for input(s): TSH, T4TOTAL, FREET4, T3FREE, THYROIDAB in the last 72 hours. Anemia Panel: No results for input(s): VITAMINB12, FOLATE, FERRITIN, TIBC, IRON, RETICCTPCT in the last 72 hours. Urine analysis:    Component Value Date/Time   COLORURINE YELLOW 02/20/2018 1827   APPEARANCEUR HAZY (A) 02/20/2018 1827   LABSPEC 1.010 02/20/2018 1827   PHURINE 6.0 02/20/2018 1827   GLUCOSEU NEGATIVE 02/20/2018 1827   HGBUR NEGATIVE 02/20/2018 1827   BILIRUBINUR NEGATIVE 02/20/2018 1827    KETONESUR NEGATIVE 02/20/2018 1827   PROTEINUR NEGATIVE 02/20/2018 1827   NITRITE NEGATIVE 02/20/2018 1827   LEUKOCYTESUR LARGE (A) 02/20/2018 1827   Sepsis Labs: _0 (procalcitonin:4,lacticidven:4)  )No results found for this or any previous visit (from the past 240 hour(s)).    Studies: Dg Chest 2 View  Result Date: 06/19/2018 CLINICAL DATA:  Leukocytosis EXAM: CHEST - 2 VIEW COMPARISON:  04/03/2018 FINDINGS: Small bilateral pleural effusions. Bilateral interstitial and alveolar airspace opacities. No pneumothorax. Stable cardiomegaly. Prior median sternotomy. Cardiac pacemaker is noted. No aggressive osseous lesion. IMPRESSION: 1. Findings concerning for pulmonary edema. Electronically Signed   By: Kathreen Devoid   On: 06/19/2018 16:58    Scheduled Meds: . sodium chloride   Intravenous Once  . carvedilol  6.25 mg Oral BID  . ferrous sulfate  325 mg Oral BID WC  . folic acid  2 mg Oral Daily  . furosemide  20 mg Intravenous Once  . levETIRAcetam  500 mg Oral BID  . levothyroxine  100 mcg Oral Q0600  . magnesium oxide  400 mg Oral Daily  . pantoprazole  40 mg Oral Daily  . potassium chloride SA  20 mEq Oral Daily  .  sertraline  50 mg Oral Daily    Continuous Infusions:   LOS: 0 days     Alma Friendly, MD [facility]  If 7PM-7AM, please contact night-coverage [email or web address] 06/20/2018, 2:10 PM

## 2018-06-21 DIAGNOSIS — D649 Anemia, unspecified: Secondary | ICD-10-CM | POA: Diagnosis not present

## 2018-06-21 DIAGNOSIS — R627 Adult failure to thrive: Secondary | ICD-10-CM | POA: Diagnosis not present

## 2018-06-21 DIAGNOSIS — G309 Alzheimer's disease, unspecified: Secondary | ICD-10-CM | POA: Diagnosis not present

## 2018-06-21 DIAGNOSIS — I5022 Chronic systolic (congestive) heart failure: Secondary | ICD-10-CM | POA: Diagnosis not present

## 2018-06-21 LAB — BASIC METABOLIC PANEL
Anion gap: 7 (ref 5–15)
BUN: 38 mg/dL — ABNORMAL HIGH (ref 8–23)
CHLORIDE: 109 mmol/L (ref 98–111)
CO2: 31 mmol/L (ref 22–32)
Calcium: 8.6 mg/dL — ABNORMAL LOW (ref 8.9–10.3)
Creatinine, Ser: 0.57 mg/dL (ref 0.44–1.00)
GFR calc non Af Amer: >60 mL/min (ref >60)
Glucose, Bld: 172 mg/dL — ABNORMAL HIGH (ref 70–99)
Potassium: 3.3 mmol/L — ABNORMAL LOW (ref 3.5–5.1)
Sodium: 147 mmol/L — ABNORMAL HIGH (ref 135–145)

## 2018-06-21 LAB — BPAM RBC
BLOOD PRODUCT EXPIRATION DATE: 201912282359
Blood Product Expiration Date: 201912272359
ISSUE DATE / TIME: 201912062314
ISSUE DATE / TIME: 201912061651
Unit Type and Rh: 6200
Unit Type and Rh: 6200

## 2018-06-21 LAB — CBC WITH DIFFERENTIAL/PLATELET
Abs Immature Granulocytes: 0.09 10*3/uL — ABNORMAL HIGH (ref 0.00–0.07)
Basophils Absolute: 0 10*3/uL (ref 0.0–0.1)
Basophils Relative: 0 %
Eosinophils Absolute: 0 10*3/uL (ref 0.0–0.5)
Eosinophils Relative: 0 %
HCT: 33.4 % — ABNORMAL LOW (ref 36.0–46.0)
Hemoglobin: 10.7 g/dL — ABNORMAL LOW (ref 12.0–15.0)
Immature Granulocytes: 1 %
Lymphocytes Relative: 9 %
Lymphs Abs: 0.9 10*3/uL (ref 0.7–4.0)
MCH: 31.8 pg (ref 26.0–34.0)
MCHC: 32 g/dL (ref 30.0–36.0)
MCV: 99.4 fL (ref 80.0–100.0)
Monocytes Absolute: 0.4 10*3/uL (ref 0.1–1.0)
Monocytes Relative: 4 %
Neutro Abs: 8.6 10*3/uL — ABNORMAL HIGH (ref 1.7–7.7)
Neutrophils Relative %: 86 %
Platelets: 112 10*3/uL — ABNORMAL LOW (ref 150–400)
RBC: 3.36 MIL/uL — ABNORMAL LOW (ref 3.87–5.11)
RDW: 19.1 % — ABNORMAL HIGH (ref 11.5–15.5)
WBC: 10 10*3/uL (ref 4.0–10.5)
nRBC: 0.2 % (ref 0.0–0.2)

## 2018-06-21 LAB — TYPE AND SCREEN
ABO/RH(D): A POS
Antibody Screen: NEGATIVE
Unit division: 0
Unit division: 0

## 2018-06-21 LAB — GLUCOSE, CAPILLARY
GLUCOSE-CAPILLARY: 153 mg/dL — AB (ref 70–99)
Glucose-Capillary: 161 mg/dL — ABNORMAL HIGH (ref 70–99)
Glucose-Capillary: 146 mg/dL — ABNORMAL HIGH (ref 70–99)
Glucose-Capillary: 163 mg/dL — ABNORMAL HIGH (ref 70–99)
Glucose-Capillary: 112 mg/dL — ABNORMAL HIGH (ref 70–99)

## 2018-06-21 LAB — MAGNESIUM: MAGNESIUM: 1.8 mg/dL (ref 1.7–2.4)

## 2018-06-21 NOTE — Progress Notes (Signed)
PROGRESS NOTE  Jean Hodges KUV:[id number] DOB: Sep 04, 1933 DOA: 06/19/2018 PCP: Patient, No Pcp Per  HPI/Recap of past 24 hours: HPI from Dr Elease Hashimoto is a 84 y.o. female with past medical history significant for advanced end-stage Alzheimer dementia, myelodysplastic syndrome, transfusion dependent, CHF, type 2 diabetes, right BKA, seizure who was transferred from [facility] rehab for blood transfusion. Pt follows at [facility] per prior documentation and they were recommending hospice care (currently not on hospice care). Patient was not able to provide history, oriented to self only. Pt was able to deny chest pain, shortness of breath.  Currently no evidence of active GI bleed. Pt admitted for further management.   Today, met pt being cleaned by NT after BM. Pt noted to be moaning/crying when moved, not able to voice concerns. Hx of significant dementia. Guarded prognosis  Assessment/Plan: Active Problems:   Tonic-clonic seizure disorder (HCC)   Hypothyroidism   Type 2 diabetes mellitus with peripheral vascular disease (HCC)   PVD (peripheral vascular disease) ([place])   Alzheimer's dementia ([place])   Symptomatic anemia   Severe protein-calorie malnutrition (HCC)   Adult failure to thrive   Chronic systolic CHF (congestive heart failure) (HCC)   Anemia  Acute on chronic blood loss anemia likely 2/2 MDS Transfusion dependent Hemoglobin on admission 6.9, status post 2 units of PRBC-->??11.4 FOBT pending Continue oral iron supplementation, folic acid Dr. Alvy Bimler will help arrange follow-up at the cancer center Monitor closely for any signs of bleeding Daily CBC  Leukocytosis Resolved Afebrile UA/UC pending collection [facility] x2 NGTD Chest x-ray unremarkable for infection, but notes pulmonary edema Monitor for any signs of fever  ??Acute metabolic encephalopathy Vs dementia Unsure if this is her baseline Work-up for any infectious process in progress CT head unremarkable for  any acute process Monitor closely  Sacral unstageable ulcer, right BKA stump with stage II ulcer Foul-smelling sacral ulcer with black necrotic tissue Wound care consulted, appreciate recs  Acute on chronic systolic heart failure Chest x-ray with pulmonary edema Echo on 10/2017 with EF of 20 to 25%, PA peak pressure of 44 mmHg Continue IV Lasix, Coreg Strict I's and O's, daily weights  Hypertension Only on Coreg Hydralazine PRN  ??Diabetes mellitus type 2 Not on any home meds, noted hyperglycemia on BMP 271, 217 A1c on 05/2018 was 5.9 SSI, accuchecks  Severe malnutrition Appears cachectic  SLP, dietitian consult Continue with Ensure  Hypothyroidism Continue with Synthroid  History of seizure Continue with Keppra  Goals of care discussion Palliative care consulted          Malnutrition Type:      Malnutrition Characteristics:      Nutrition Interventions:       Estimated body mass index is 18.32 kg/m as calculated from the following:   Height as of 05/14/18: _0  (1.6 m).   Weight as of this encounter: 46.9 kg.     Code Status: Full  Family Communication: None at bedside  Disposition Plan: Likely back to SNF   Consultants:  None  Procedures:  None  Antimicrobials:  None  DVT prophylaxis: SCDs   Objective: Vitals:   06/20/18 1510 06/20/18 2048 06/21/18 0507 06/21/18 1443  BP: (!) 158/65 (!) 157/75 (!) 137/53 (!) 123/57  Pulse: 93 84 80 82  Resp: _1 (!) 22  Temp: 98.6 F (37 C) 100.1 F (37.8 C) 98.5 F (36.9 C) 98.2 F (36.8 C)  TempSrc: Oral Oral Oral Oral  SpO2: 98% 96% 95% 100%  Weight:   46.9 kg     Intake/Output Summary (Last 24 hours) at 06/21/2018 1542 Last data filed at 06/21/2018 1000 Gross per 24 hour  Intake 60 ml  Output -  Net 60 ml   Filed Weights   06/21/18 0507  Weight: 46.9 kg    Exam:  General: Moaning/crying when moved, awake, unable to follow commands, very  cachetic  Cardiovascular: S1, S2 present  Respiratory: CTAB, although poor respiratory effort  Abdomen: Soft, nontender, nondistended, bowel sounds present  Musculoskeletal: AKA with stage II ulcer, LLE, no edema noted  Skin: Very dry and scaly, unstageable sacral decubitus ulcer  Psychiatry: Unable to assess, although crying  Data Reviewed: CBC: Recent Labs  Lab 06/19/18 1444 06/20/18 0612 06/21/18 0605  WBC 16.2* 13.4* 10.0  NEUTROABS 14.0*  --  8.6*  HGB 6.9* 11.4* 10.7*  HCT 22.6* 35.6* 33.4*  MCV 107.6* 96.7 99.4  PLT 148* 107* 505*   Basic Metabolic Panel: Recent Labs  Lab 06/19/18 1444 06/20/18 0612 06/21/18 0605  NA 145 147* 147*  K 3.9 3.6 3.3*  CL 108 109 109  CO2 _0 GLUCOSE 271* 217* 172*  BUN 33* 33* 38*  CREATININE 0.57 0.55 0.57  CALCIUM 8.7* 8.8* 8.6*  MG  --   --  1.8   GFR: Estimated Creatinine Clearance: 40.1 mL/min (by C-G formula based on SCr of 0.57 mg/dL). Liver Function Tests: No results for input(s): AST, ALT, ALKPHOS, BILITOT, PROT, ALBUMIN in the last 168 hours. No results for input(s): LIPASE, AMYLASE in the last 168 hours. No results for input(s): AMMONIA in the last 168 hours. Coagulation Profile: No results for input(s): INR, PROTIME in the last 168 hours. Cardiac Enzymes: No results for input(s): CKTOTAL, CKMB, CKMBINDEX, TROPONINI in the last 168 hours. BNP (last 3 results) No results for input(s): PROBNP in the last 8760 hours. HbA1C: No results for input(s): HGBA1C in the last 72 hours. CBG: Recent Labs  Lab 06/20/18 1833 06/20/18 2057 06/21/18 0751  GLUCAP 161* 161* 146*   Lipid Profile: No results for input(s): CHOL, HDL, LDLCALC, TRIG, CHOLHDL, LDLDIRECT in the last 72 hours. Thyroid Function Tests: No results for input(s): TSH, T4TOTAL, FREET4, T3FREE, THYROIDAB in the last 72 hours. Anemia Panel: No results for input(s): VITAMINB12, FOLATE, FERRITIN, TIBC, IRON, RETICCTPCT in the last 72 hours. Urine  analysis:    Component Value Date/Time   COLORURINE YELLOW 02/20/2018 1827   APPEARANCEUR HAZY (A) 02/20/2018 1827   LABSPEC 1.010 02/20/2018 1827   PHURINE 6.0 02/20/2018 1827   GLUCOSEU NEGATIVE 02/20/2018 1827   HGBUR NEGATIVE 02/20/2018 1827   BILIRUBINUR NEGATIVE 02/20/2018 1827   KETONESUR NEGATIVE 02/20/2018 1827   PROTEINUR NEGATIVE 02/20/2018 1827   NITRITE NEGATIVE 02/20/2018 1827   LEUKOCYTESUR LARGE (A) 02/20/2018 1827   Sepsis Labs: _1 (procalcitonin:4,lacticidven:4)  ) Recent Results (from the past 240 hour(s))  Culture, blood (routine x 2)     Status: None (Preliminary result)   Collection Time: 06/20/18  2:54 PM  Result Value Ref Range Status   Specimen Description   Final    BLOOD LEFT HAND Performed at [facility], [place] [address], [place] [address], [place] [address]    Special Requests   Final    BOTTLES DRAWN AEROBIC ONLY Blood Culture adequate volume Performed at [facility] [address], [place], [place] [address]    Culture   Final    NO GROWTH < 24 HOURS Performed at [facility]  [facility], [address] [place], [place] [address]    Report Status PENDING  Incomplete  Culture, blood (routine x 2)     Status: None (Preliminary result)   Collection Time: 06/20/18  2:54 PM  Result Value Ref Range Status   Specimen Description   Final    BLOOD RIGHT HAND Performed at [facility] [address] [place], [place] [address]    Special Requests   Final    BOTTLES DRAWN AEROBIC ONLY Blood Culture adequate volume Performed at [facility] [address], [place], [place] [address]    Culture   Final    NO GROWTH < 24 HOURS Performed at [facility] [address] [place], [place] [address]    Report Status PENDING  Incomplete      Studies: Ct Head Wo Contrast  Result Date: 06/20/2018 CLINICAL DATA:  Altered level of consciousness. EXAM: CT HEAD WITHOUT  CONTRAST TECHNIQUE: Contiguous axial images were obtained from the base of the skull through the vertex without intravenous contrast. COMPARISON:  04/05/2018 FINDINGS: Brain: There is no evidence for acute hemorrhage, hydrocephalus, mass lesion, or abnormal extra-axial fluid collection. No definite CT evidence for acute infarction. Diffuse loss of parenchymal volume is consistent with atrophy. Patchy low attenuation in the deep hemispheric and periventricular white matter is nonspecific, but likely reflects chronic microvascular ischemic demyelination. Old right occipital lobe infarct is similar to prior. Vascular: No hyperdense vessel or unexpected calcification. Skull: No evidence for fracture. No worrisome lytic or sclerotic lesion. Sinuses/Orbits: Stable appearance of fluid in the right mastoid air cells. Left mastoid air cells and visualized paranasal sinuses are clear. Visualized portions of the globes and intraorbital fat are unremarkable. Other: None. IMPRESSION: 1. Stable.  No acute intracranial abnormality. 2. Atrophy with chronic small vessel white matter ischemic disease. 3. 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 Electronically Signed   By: Misty Stanley M.D.   On: 06/20/2018 17:04    Scheduled Meds: . sodium chloride   Intravenous Once  . carvedilol  6.25 mg Oral BID  . ferrous sulfate  325 mg Oral BID WC  . folic acid  2 mg Oral Daily  . furosemide  20 mg Intravenous Daily  . insulin aspart  0-9 Units Subcutaneous TID WC  . levETIRAcetam  500 mg Oral BID  . levothyroxine  100 mcg Oral Q0600  . magnesium oxide  400 mg Oral Daily  . pantoprazole  40 mg Oral Daily  . potassium chloride SA  20 mEq Oral Daily  . sertraline  50 mg Oral Daily    Continuous Infusions:   LOS: 0 days     Alma Friendly, MD [facility]  If 7PM-7AM, please contact night-coverage [email or web address] 06/21/2018, 3:42 PM

## 2018-06-22 ENCOUNTER — Observation Stay (HOSPITAL_COMMUNITY): Payer: Medicare Other

## 2018-06-22 DIAGNOSIS — J9811 Atelectasis: Secondary | ICD-10-CM | POA: Diagnosis present

## 2018-06-22 DIAGNOSIS — F0280 Dementia in other diseases classified elsewhere without behavioral disturbance: Secondary | ICD-10-CM | POA: Diagnosis present

## 2018-06-22 DIAGNOSIS — D62 Acute posthemorrhagic anemia: Secondary | ICD-10-CM | POA: Diagnosis present

## 2018-06-22 DIAGNOSIS — D638 Anemia in other chronic diseases classified elsewhere: Secondary | ICD-10-CM | POA: Diagnosis present

## 2018-06-22 DIAGNOSIS — I739 Peripheral vascular disease, unspecified: Secondary | ICD-10-CM | POA: Diagnosis not present

## 2018-06-22 DIAGNOSIS — E039 Hypothyroidism, unspecified: Secondary | ICD-10-CM | POA: Diagnosis present

## 2018-06-22 DIAGNOSIS — D649 Anemia, unspecified: Secondary | ICD-10-CM | POA: Diagnosis not present

## 2018-06-22 DIAGNOSIS — D6959 Other secondary thrombocytopenia: Secondary | ICD-10-CM | POA: Diagnosis present

## 2018-06-22 DIAGNOSIS — Z7189 Other specified counseling: Secondary | ICD-10-CM | POA: Diagnosis not present

## 2018-06-22 DIAGNOSIS — L89150 Pressure ulcer of sacral region, unstageable: Secondary | ICD-10-CM | POA: Diagnosis present

## 2018-06-22 DIAGNOSIS — G40409 Other generalized epilepsy and epileptic syndromes, not intractable, without status epilepticus: Secondary | ICD-10-CM | POA: Diagnosis present

## 2018-06-22 DIAGNOSIS — R627 Adult failure to thrive: Secondary | ICD-10-CM | POA: Diagnosis not present

## 2018-06-22 DIAGNOSIS — I110 Hypertensive heart disease with heart failure: Secondary | ICD-10-CM | POA: Diagnosis present

## 2018-06-22 DIAGNOSIS — I5023 Acute on chronic systolic (congestive) heart failure: Secondary | ICD-10-CM | POA: Diagnosis present

## 2018-06-22 DIAGNOSIS — Z515 Encounter for palliative care: Secondary | ICD-10-CM | POA: Diagnosis present

## 2018-06-22 DIAGNOSIS — E1151 Type 2 diabetes mellitus with diabetic peripheral angiopathy without gangrene: Secondary | ICD-10-CM | POA: Diagnosis present

## 2018-06-22 DIAGNOSIS — D464 Refractory anemia, unspecified: Secondary | ICD-10-CM | POA: Diagnosis present

## 2018-06-22 DIAGNOSIS — L89159 Pressure ulcer of sacral region, unspecified stage: Secondary | ICD-10-CM | POA: Diagnosis not present

## 2018-06-22 DIAGNOSIS — E870 Hyperosmolality and hypernatremia: Secondary | ICD-10-CM | POA: Diagnosis present

## 2018-06-22 DIAGNOSIS — Z66 Do not resuscitate: Secondary | ICD-10-CM | POA: Diagnosis present

## 2018-06-22 DIAGNOSIS — D72829 Elevated white blood cell count, unspecified: Secondary | ICD-10-CM | POA: Diagnosis present

## 2018-06-22 DIAGNOSIS — E785 Hyperlipidemia, unspecified: Secondary | ICD-10-CM | POA: Diagnosis present

## 2018-06-22 DIAGNOSIS — G309 Alzheimer's disease, unspecified: Secondary | ICD-10-CM | POA: Diagnosis present

## 2018-06-22 DIAGNOSIS — I5022 Chronic systolic (congestive) heart failure: Secondary | ICD-10-CM | POA: Diagnosis not present

## 2018-06-22 DIAGNOSIS — I081 Rheumatic disorders of both mitral and tricuspid valves: Secondary | ICD-10-CM | POA: Diagnosis present

## 2018-06-22 DIAGNOSIS — E43 Unspecified severe protein-calorie malnutrition: Secondary | ICD-10-CM | POA: Diagnosis present

## 2018-06-22 DIAGNOSIS — Z681 Body mass index (BMI) 19 or less, adult: Secondary | ICD-10-CM | POA: Diagnosis not present

## 2018-06-22 DIAGNOSIS — D619 Aplastic anemia, unspecified: Secondary | ICD-10-CM | POA: Diagnosis not present

## 2018-06-22 DIAGNOSIS — E876 Hypokalemia: Secondary | ICD-10-CM | POA: Diagnosis present

## 2018-06-22 DIAGNOSIS — E871 Hypo-osmolality and hyponatremia: Secondary | ICD-10-CM | POA: Diagnosis present

## 2018-06-22 LAB — BLOOD CULTURE ID PANEL (REFLEXED)
Acinetobacter baumannii: NOT DETECTED
CANDIDA KRUSEI: NOT DETECTED
Candida albicans: NOT DETECTED
Candida glabrata: NOT DETECTED
Candida parapsilosis: NOT DETECTED
Candida tropicalis: NOT DETECTED
Enterobacter cloacae complex: NOT DETECTED
Enterobacteriaceae species: NOT DETECTED
Enterococcus species: NOT DETECTED
Escherichia coli: NOT DETECTED
Haemophilus influenzae: NOT DETECTED
KLEBSIELLA OXYTOCA: NOT DETECTED
Klebsiella pneumoniae: NOT DETECTED
Listeria monocytogenes: NOT DETECTED
Methicillin resistance: DETECTED — AB
Neisseria meningitidis: NOT DETECTED
Proteus species: NOT DETECTED
Pseudomonas aeruginosa: NOT DETECTED
SERRATIA MARCESCENS: NOT DETECTED
STAPHYLOCOCCUS AUREUS BCID: NOT DETECTED
Staphylococcus species: DETECTED — AB
Streptococcus agalactiae: NOT DETECTED
Streptococcus pneumoniae: NOT DETECTED
Streptococcus pyogenes: NOT DETECTED
Streptococcus species: NOT DETECTED

## 2018-06-22 LAB — CBC WITH DIFFERENTIAL/PLATELET
Abs Immature Granulocytes: 0.08 10*3/uL — ABNORMAL HIGH (ref 0.00–0.07)
BASOS PCT: 0 %
Basophils Absolute: 0 10*3/uL (ref 0.0–0.1)
Eosinophils Absolute: 0 10*3/uL (ref 0.0–0.5)
Eosinophils Relative: 0 %
HCT: 34.5 % — ABNORMAL LOW (ref 36.0–46.0)
Hemoglobin: 10.5 g/dL — ABNORMAL LOW (ref 12.0–15.0)
IMMATURE GRANULOCYTES: 1 %
Lymphocytes Relative: 12 %
Lymphs Abs: 1 10*3/uL (ref 0.7–4.0)
MCH: 31.7 pg (ref 26.0–34.0)
MCHC: 30.4 g/dL (ref 30.0–36.0)
MCV: 104.2 fL — ABNORMAL HIGH (ref 80.0–100.0)
Monocytes Absolute: 0.4 10*3/uL (ref 0.1–1.0)
Monocytes Relative: 5 %
NRBC: 0 % (ref 0.0–0.2)
Neutro Abs: 7.4 10*3/uL (ref 1.7–7.7)
Neutrophils Relative %: 82 %
PLATELETS: 102 10*3/uL — AB (ref 150–400)
RBC: 3.31 MIL/uL — ABNORMAL LOW (ref 3.87–5.11)
RDW: 18.3 % — ABNORMAL HIGH (ref 11.5–15.5)
WBC: 8.9 10*3/uL (ref 4.0–10.5)

## 2018-06-22 LAB — BASIC METABOLIC PANEL
Anion gap: 7 (ref 5–15)
BUN: 39 mg/dL — ABNORMAL HIGH (ref 8–23)
CO2: 30 mmol/L (ref 22–32)
Calcium: 8.5 mg/dL — ABNORMAL LOW (ref 8.9–10.3)
Chloride: 112 mmol/L — ABNORMAL HIGH (ref 98–111)
Creatinine, Ser: 0.4 mg/dL — ABNORMAL LOW (ref 0.44–1.00)
GFR calc Af Amer: >60 mL/min (ref >60)
GFR calc non Af Amer: >60 mL/min (ref >60)
Glucose, Bld: 90 mg/dL (ref 70–99)
Potassium: 3.3 mmol/L — ABNORMAL LOW (ref 3.5–5.1)
SODIUM: 149 mmol/L — AB (ref 135–145)

## 2018-06-22 LAB — GLUCOSE, CAPILLARY
GLUCOSE-CAPILLARY: 81 mg/dL (ref 70–99)
Glucose-Capillary: 95 mg/dL (ref 70–99)
Glucose-Capillary: 108 mg/dL — ABNORMAL HIGH (ref 70–99)
Glucose-Capillary: 125 mg/dL — ABNORMAL HIGH (ref 70–99)

## 2018-06-22 MED ORDER — POTASSIUM CL IN DEXTROSE 5% 20 MEQ/L IV SOLN
20.0000 meq | INTRAVENOUS | Status: DC
  Administered 2018-06-22: 20 meq via INTRAVENOUS
  Filled 2018-06-22: qty 1000

## 2018-06-22 MED ORDER — LEVETIRACETAM 500 MG PO TABS: 500.0000 mg | ORAL_TABLET | Freq: Two times a day (BID) | ORAL | Status: DC

## 2018-06-22 MED ORDER — DAKINS (1/4 STRENGTH) 0.125 % EX SOLN
Freq: Every day | CUTANEOUS | Status: AC
  Administered 2018-06-22 – 2018-06-24 (×3)
  Filled 2018-06-22: qty 473

## 2018-06-22 MED ORDER — POTASSIUM CL IN DEXTROSE 5% 20 MEQ/L IV SOLN
20.0000 meq | INTRAVENOUS | Status: AC
  Administered 2018-06-22: 20 meq via INTRAVENOUS
  Filled 2018-06-22: qty 1000

## 2018-06-22 MED ORDER — MORPHINE SULFATE (PF) 2 MG/ML IV SOLN
1.0000 mg | Freq: Once | INTRAVENOUS | Status: AC
  Administered 2018-06-22: 1 mg via INTRAVENOUS
  Filled 2018-06-22: qty 1

## 2018-06-22 MED ORDER — LEVETIRACETAM IN NACL 500 MG/100ML IV SOLN
500.0000 mg | Freq: Once | INTRAVENOUS | Status: AC
  Administered 2018-06-22: 500 mg via INTRAVENOUS
  Filled 2018-06-22: qty 100

## 2018-06-22 NOTE — Evaluation (Signed)
Clinical/Bedside Swallow Evaluation Patient Details  Name: Jean Hodges MRN: [id number] Date of Birth: Dec 25, 1933  Today's Date: 06/22/2018 Time: SLP Start Time (ACUTE ONLY): 1128 SLP Stop Time (ACUTE ONLY): 1144 SLP Time Calculation (min) (ACUTE ONLY): 16 min  Past Medical History:  Past Medical History:  Diagnosis Date  . Acute encephalopathy   . Acute encephalopathy 09/30/2016  . Alzheimer's dementia without behavioral disturbance ([place])   . Alzheimer's dementia without behavioral disturbance ([place]) 10/07/2016  . Anemia   . Anemia 01/28/2014  . Atherosclerotic PVD with ulceration ([place])   . CAD (coronary artery disease)   . CAD (coronary artery disease) 01/04/2014  . Cellulitis   . Cellulitis of left leg   . CHF (congestive heart failure) ([place])   . Diabetes mellitus without complication ([place])   . DM (diabetes mellitus) ([place]) 01/27/2014  . Dysphagia   . HTN (hypertension) 01/04/2014  . Hyperlipidemia   . Hypertension   . Hypoglycemia associated with type 2 diabetes mellitus ([place])   . Macrocytic anemia   . Seizures ([place]) 09/30/2016  . Tear of left rotator cuff   . UTI (urinary tract infection)   . UTI (urinary tract infection) 09/25/2016   Past Surgical History:  Past Surgical History:  Procedure Laterality Date  . BELOW KNEE LEG AMPUTATION Right   . PACEMAKER IMPLANT     HPI:  84 yo female adm to [facility] with anemia.  PMH + for severe malutrition, dementia, myelodysplastic syndrome, RA, prior hospice.  CXR pulmonary edema.  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  Swallow evaluation ordered.  RN reports pt is aspirating secretions and states pt has an unstagable wound.    Assessment / Plan / Recommendation Clinical Impression  Pt reluctant to accept oral care.  Black colored secretions noted in oral cavity- suspect iron.  SLP attempted oral care using oral care set as much as pt would allow.  Pt presented with congested cough after minimal oral care and refused all intake SLP offered.   Note MOST form in hard chart completed indicated no feeding tube - form completed in February 2019.   Pt refusing po intake and thus anticipate she will not accept any po. Pt may be most comfortable with oral care and at this time. Doubt pt will swallow po medications, rec consider via iv if able. Will follow up for education with family, no family present at this time.   SLP Visit Diagnosis: Dysphagia, oropharyngeal phase (R13.12)    Aspiration Risk  Severe aspiration risk;Risk for inadequate nutrition/hydration    Diet Recommendation NPO;Ice chips PRN after oral care   Medication Administration: Via alternative means    Other  Recommendations Oral Care Recommendations: Oral care QID   Follow up Recommendations   TBD, doubtful indicated     Frequency and Duration min 1 x/week  1 week       Prognosis Prognosis for Safe Diet Advancement: Guarded Barriers to Reach Goals: Severity of deficits;Time post onset;Cognitive deficits      Swallow Study   General Date of Onset: 06/22/18 HPI: 84 yo female adm to [facility] with anemia.  PMH + for severe malutrition, dementia, myelodysplastic syndrome, RA, prior hospice.  CXR pulmonary edema.  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  Swallow evaluation ordered.  RN reports pt is aspirating secretions and states pt has an unstagable wound.  Type of Study: Bedside Swallow Evaluation Diet Prior to this Study: Dysphagia 3 (soft);Thin liquids Temperature Spikes Noted: No History of Recent Intubation: No Behavior/Cognition: Alert;Doesn't  follow directions Oral Cavity Assessment: Other (comment)(blackish secretions coating oral cavity - superior oral cavity) Oral Care Completed by SLP: Yes(pt resistant) Oral Cavity - Dentition: Other (Comment) Vision: Impaired for self-feeding Self-Feeding Abilities: Total assist Patient Positioning: Upright in bed Baseline Vocal Quality: Low vocal intensity Volitional Cough: Cognitively unable to elicit Volitional  Swallow: Unable to elicit    Oral/Motor/Sensory Function Overall Oral Motor/Sensory Function: Generalized oral weakness(pt did not follow directions for OME, able to seal lips tightly closed with oral care)   Ice Chips Ice chips: Not tested   Thin Liquid Thin Liquid: Impaired(oral moisterizer on toothette) Pharyngeal  Phase Impairments: Suspected delayed Swallow;Cough - Immediate    Nectar Thick Nectar Thick Liquid: Not tested   Honey Thick Honey Thick Liquid: Not tested   Puree Puree: Not tested   Solid     Solid: Not tested      Jean Hodges 06/22/2018,12:42 PM   Luanna Salk, [place] Pager [phone] Office [phone]

## 2018-06-22 NOTE — Progress Notes (Signed)
PHARMACY - PHYSICIAN COMMUNICATION CRITICAL VALUE ALERT - BLOOD CULTURE IDENTIFICATION (BCID)  Results for orders placed or performed during the hospital encounter of 06/19/18  Blood Culture ID Panel (Reflexed) (Collected: 06/20/2018  2:54 PM)  Result Value Ref Range   Enterococcus species NOT DETECTED NOT DETECTED   Listeria monocytogenes NOT DETECTED NOT DETECTED   Staphylococcus species DETECTED (A) NOT DETECTED   Staphylococcus aureus (BCID) NOT DETECTED NOT DETECTED   Methicillin resistance DETECTED (A) NOT DETECTED   Streptococcus species NOT DETECTED NOT DETECTED   Streptococcus agalactiae NOT DETECTED NOT DETECTED   Streptococcus pneumoniae NOT DETECTED NOT DETECTED   Streptococcus pyogenes NOT DETECTED NOT DETECTED   Acinetobacter baumannii NOT DETECTED NOT DETECTED   Enterobacteriaceae species NOT DETECTED NOT DETECTED   Enterobacter cloacae complex NOT DETECTED NOT DETECTED   Escherichia coli NOT DETECTED NOT DETECTED   Klebsiella oxytoca NOT DETECTED NOT DETECTED   Klebsiella pneumoniae NOT DETECTED NOT DETECTED   Proteus species NOT DETECTED NOT DETECTED   Serratia marcescens NOT DETECTED NOT DETECTED   Haemophilus influenzae NOT DETECTED NOT DETECTED   Neisseria meningitidis NOT DETECTED NOT DETECTED   Pseudomonas aeruginosa NOT DETECTED NOT DETECTED   Candida albicans NOT DETECTED NOT DETECTED   Candida glabrata NOT DETECTED NOT DETECTED   Candida krusei NOT DETECTED NOT DETECTED   Candida parapsilosis NOT DETECTED NOT DETECTED   Candida tropicalis NOT DETECTED NOT DETECTED    Name of physician (or Provider) ContactedAlger Memos, NP (message sent via text page)  Currently on no antibiotics. Suspect contamination.   Changes to prescribed antibiotics required: no change recommended  Everette Rank, PharmD 06/22/2018  1:03 AM

## 2018-06-22 NOTE — Consult Note (Signed)
[place] Nurse wound consult note Reason for Consult:Unstageble pressure injury to sacrum.  Stage 3 pressure injury to right BKA stump site. Severe protein-calorie malnutrition. Patient moans uncomfortably with care. Palliative care involved.  Family wishes her to remain a full code and full scope of treatment  Wound type:pressure injury Pressure Injury POA: Yes Measurement: sacrum:  5 cm x 3.4 cm wound bed is 100% necrotic.  Does not have the albumin at this time to heal this wound.  We will debride to decrease odor and prevent infection.  Topical therapy going forward.  Right stump:  2 cm x 1 cm x 0.2 cm  Wound PQZ:[id number] Right stump pink and moist  Drainage (amount, consistency, odor) minimal serosanguinous  Foul necrotic odor to sacrum Periwound:intact Dressing procedure/placement/frequency:Cleanse sacral wound with NS. APPly Dakins moist gauze to debride x 3 days.  Cover with dry gauze and ABD pad.  Then begin Aquacel Ag to wound bed and cover with ABD pad and tape.  Will not follow at this time.  Please re-consult if needed.  Domenic Moras MSN, RN, FNP-BC CWON Wound, Ostomy, Continence Nurse Pager [phone]

## 2018-06-22 NOTE — Progress Notes (Signed)
BSE completed, full report to follow.  Pt reluctant to accept oral care.  Black colored secretions noted in oral cavity- suspect iron.  SLP attempted oral care using oral care set as much as pt would allow.  Pt presented with congested cough after minimal oral care and refused all intake SLP offered.    Note MOST form in hard chart completed indicated no feeding tube - form completed in February 2019.   Pt refusing po intake and thus anticipate she will not accept any po.    Recommend palliative consult given pt with six admits in six months and progressive malnutrition, current medical status.    Pt may be most comfortable with oral care and at this time.   Luanna Salk, [facility] SLP Acute Rehab Services Pager [phone] Office [phone]

## 2018-06-22 NOTE — Progress Notes (Signed)
PROGRESS NOTE  Jean Hodges UKR:[id number] DOB: 03-23-1934 DOA: 06/19/2018 PCP: Patient, No Pcp Per  HPI/Recap of past 24 hours: HPI from Dr Elease Hashimoto is a 84 y.o. female with past medical history significant for advanced end-stage Alzheimer dementia, myelodysplastic syndrome, transfusion dependent, CHF, type 2 diabetes, right BKA, seizure who was transferred from [facility] rehab for blood transfusion. Pt follows at [facility] per prior documentation and they were recommending hospice care (currently not on hospice care). Patient was not able to provide history, oriented to self only. Pt was able to deny chest pain, shortness of breath.  Currently no evidence of active GI bleed. Pt admitted for further management.   Spoke to son in details on 06/21/18 about pts overall medical condition and very poor prognosis. Son has a strong belief in the christian faith and believes that he has to do everything possible to see his mother alive. I explained in details about DNR/FULL CODE and what they both entails. Also spoke to patient about my recommendation for DNR and even possible hospice. Son thanked me for all the information and would discuss with his sister. Son is also awaiting a call from Palliative team  Today, pt met crying/moaning, unable to speak or answer any questions. Patient completely refusing to eat or take medications, making medical management very difficult. Multiple admissions in the past. Very poor and guarded prognosis.    Assessment/Plan: Active Problems:   Tonic-clonic seizure disorder (HCC)   Hypothyroidism   Type 2 diabetes mellitus with peripheral vascular disease (HCC)   PVD (peripheral vascular disease) ([place])   Alzheimer's dementia ([place])   Symptomatic anemia   Severe protein-calorie malnutrition (HCC)   Adult failure to thrive   Chronic systolic CHF (congestive heart failure) (HCC)   Anemia  Acute on chronic blood loss anemia likely 2/2 MDS Transfusion  dependent Hemoglobin on admission 6.9, status post 2 units of PRBC-->??11.4 FOBT pending Continue oral iron supplementation, folic acid Dr. Alvy Bimler will help arrange follow-up at the cancer center Monitor closely for any signs of bleeding Daily CBC  Leukocytosis Resolved Afebrile UA/UC pending collection BC x2, 1 bottle grew staph coagulase negative, likely contaminant. Repeat BC X 2 pending Chest x-ray unremarkable for infection, but notes pulmonary edema, repeats still shows congestion Monitor for any signs of fever  ??Acute metabolic encephalopathy Vs dementia Unsure if this is her baseline Work-up for any infectious process in progress CT head unremarkable for any acute process Monitor closely  Sacral unstageable ulcer, right BKA stump with stage II ulcer with pain Foul-smelling sacral ulcer with black necrotic tissue Wound care consulted, appreciate recs  Acute on chronic systolic heart failure Chest x-ray with pulmonary edema Echo on 10/2017 with EF of 20 to 25%, PA peak pressure of 44 mmHg Held IV Lasix for now as pt is not eating or drinking, continue Coreg Strict I's and O's, daily weights  Hypernatremia Na 149, likely due to poor oral intake Held lasix for now, start gentle hydration with IV D5W + K @ 50cc/hr for 1 day only Daily BMP  Hypokalemia Replace prn  Hypertension Only on Coreg Hydralazine PRN  ??Diabetes mellitus type 2 Not on any home meds, noted hyperglycemia on BMP 271, 217 A1c on 05/2018 was 5.9 SSI, accuchecks  Severe malnutrition Appears cachectic  SLP, dietitian consult Continue with Ensure  Hypothyroidism Continue with Synthroid  History of seizure Continue with Keppra  Goals of care discussion Palliative care consulted  Malnutrition Type:  Nutrition Problem: Severe Malnutrition Etiology: chronic illness(end stage Alzheimer's Disease)   Malnutrition Characteristics:  Signs/Symptoms: severe fat depletion,  severe muscle depletion   Nutrition Interventions:  Interventions: Refer to RD note for recommendations    Estimated body mass index is 17.61 kg/m as calculated from the following:   Height as of 05/14/18: 5' 3"  (1.6 m).   Weight as of this encounter: 45.1 kg.     Code Status: Full  Family Communication: Spoke to son in details on 06/21/18 about pts overall medical condition and very poor prognosis. Son has a strong belief in the christian faith and believes that he has to do everything possible to see his mother alive. I explained in details about DNR/FULL CODE and what they both entails. Also spoke to patient about my recommendation for DNR and even possible hospice. Son thanked me for all the information and would discuss with his sister. Son is also awaiting a call from Palliative team  Disposition Plan: Likely back to SNF on 06/23/18, as no further work up is currently needed.   Consultants:  None  Procedures:  None  Antimicrobials:  None  DVT prophylaxis: SCDs   Objective: Vitals:   06/21/18 1944 06/22/18 0448 06/22/18 0600 06/22/18 1420  BP: (!) 158/57 (!) 139/51  (!) 143/55  Pulse: 78 78  81  Resp: 18 16  12   Temp: 98.2 F (36.8 C) 98.2 F (36.8 C)  98.1 F (36.7 C)  TempSrc:  Oral  Oral  SpO2: 100% 99%  97%  Weight:   45.1 kg     Intake/Output Summary (Last 24 hours) at 06/22/2018 1552 Last data filed at 06/22/2018 1239 Gross per 24 hour  Intake 0 ml  Output -  Net 0 ml   Filed Weights   06/21/18 0507 06/22/18 0600  Weight: 46.9 kg 45.1 kg    Exam:  General: Moaning/crying, unable to follow commands, very cachetic  Cardiovascular: S1, S2 present  Respiratory: CTAB, although poor inspiratory effort  Abdomen: Soft, nontender, nondistended, bowel sounds present  Musculoskeletal: AKA with stage II ulcer, LLE, no edema noted  Skin: Very dry and scaly, unstageable sacral ulcer  Psychiatry: Unable to assess, sad, crying   Data  Reviewed: CBC: Recent Labs  Lab 06/19/18 1444 06/20/18 0612 06/21/18 0605 06/22/18 0602  WBC 16.2* 13.4* 10.0 8.9  NEUTROABS 14.0*  --  8.6* 7.4  HGB 6.9* 11.4* 10.7* 10.5*  HCT 22.6* 35.6* 33.4* 34.5*  MCV 107.6* 96.7 99.4 104.2*  PLT 148* 107* 112* 115*   Basic Metabolic Panel: Recent Labs  Lab 06/19/18 1444 06/20/18 0612 06/21/18 0605 06/22/18 0602  NA 145 147* 147* 149*  K 3.9 3.6 3.3* 3.3*  CL 108 109 109 112*  CO2 30 31 31 30   GLUCOSE 271* 217* 172* 90  BUN 33* 33* 38* 39*  CREATININE 0.57 0.55 0.57 0.40*  CALCIUM 8.7* 8.8* 8.6* 8.5*  MG  --   --  1.8  --    GFR: Estimated Creatinine Clearance: 38.6 mL/min (A) (by C-G formula based on SCr of 0.4 mg/dL (L)). Liver Function Tests: No results for input(s): AST, ALT, ALKPHOS, BILITOT, PROT, ALBUMIN in the last 168 hours. No results for input(s): LIPASE, AMYLASE in the last 168 hours. No results for input(s): AMMONIA in the last 168 hours. Coagulation Profile: No results for input(s): INR, PROTIME in the last 168 hours. Cardiac Enzymes: No results for input(s): CKTOTAL, CKMB, CKMBINDEX, TROPONINI in the last 168 hours. BNP (last  3 results) No results for input(s): PROBNP in the last 8760 hours. HbA1C: No results for input(s): HGBA1C in the last 72 hours. CBG: Recent Labs  Lab 06/21/18 1507 06/21/18 1733 06/21/18 1947 06/22/18 0806 06/22/18 1228  GLUCAP 153* 163* 112* 81 95   Lipid Profile: No results for input(s): CHOL, HDL, LDLCALC, TRIG, CHOLHDL, LDLDIRECT in the last 72 hours. Thyroid Function Tests: No results for input(s): TSH, T4TOTAL, FREET4, T3FREE, THYROIDAB in the last 72 hours. Anemia Panel: No results for input(s): VITAMINB12, FOLATE, FERRITIN, TIBC, IRON, RETICCTPCT in the last 72 hours. Urine analysis:    Component Value Date/Time   COLORURINE YELLOW 02/20/2018 1827   APPEARANCEUR HAZY (A) 02/20/2018 1827   LABSPEC 1.010 02/20/2018 1827   PHURINE 6.0 02/20/2018 1827   GLUCOSEU  NEGATIVE 02/20/2018 1827   HGBUR NEGATIVE 02/20/2018 1827   BILIRUBINUR NEGATIVE 02/20/2018 1827   KETONESUR NEGATIVE 02/20/2018 1827   PROTEINUR NEGATIVE 02/20/2018 1827   NITRITE NEGATIVE 02/20/2018 1827   LEUKOCYTESUR LARGE (A) 02/20/2018 1827   Sepsis Labs: @LABRCNTIP (procalcitonin:4,lacticidven:4)  ) Recent Results (from the past 240 hour(s))  Culture, blood (routine x 2)     Status: None (Preliminary result)   Collection Time: 06/20/18  2:54 PM  Result Value Ref Range Status   Specimen Description   Final    BLOOD LEFT HAND Performed at [facility], [place] [address] [place], [place] [address]    Special Requests   Final    BOTTLES DRAWN AEROBIC ONLY Blood Culture adequate volume Performed at [place] [address] [place], [place] [address]    Culture   Final    NO GROWTH 2 DAYS Performed at [facility] [address], [place], [place] [address]    Report Status PENDING  Incomplete  Culture, blood (routine x 2)     Status: Abnormal (Preliminary result)   Collection Time: 06/20/18  2:54 PM  Result Value Ref Range Status   Specimen Description   Final    BLOOD RIGHT HAND Performed at [facility] [address], [place], [place] [address]    Special Requests   Final    BOTTLES DRAWN AEROBIC ONLY Blood Culture adequate volume Performed at [facility] [address] [place], [place] [address]    Culture  Setup Time   Final    GRAM POSITIVE COCCI AEROBIC BOTTLE ONLY CRITICAL RESULT CALLED TO, READ BACK BY AND VERIFIED WITH: L.PONDEXTER,PHARMD 2359 06/21/18 M.CAMPBELL Performed at [facility], [place] [address], [place],  [address]    Culture STAPHYLOCOCCUS SPECIES (COAGULASE NEGATIVE) (A)  Final   Report Status PENDING  Incomplete  Blood Culture ID Panel (Reflexed)     Status: Abnormal   Collection Time: 06/20/18  2:54 PM  Result Value Ref Range Status    Enterococcus species NOT DETECTED NOT DETECTED Final   Listeria monocytogenes NOT DETECTED NOT DETECTED Final   Staphylococcus species DETECTED (A) NOT DETECTED Final    Comment: Methicillin (oxacillin) resistant coagulase negative staphylococcus. Possible blood culture contaminant (unless isolated from more than one blood culture draw or clinical case suggests pathogenicity). No antibiotic treatment is indicated for blood  culture contaminants. CRITICAL RESULT CALLED TO, READ BACK BY AND VERIFIED WITH: L.PONDEXTER,PHARMD 2359 06/21/18 M.CAMPBELL    Staphylococcus aureus (BCID) NOT DETECTED NOT DETECTED Final   Methicillin resistance DETECTED (A) NOT DETECTED Final    Comment: CRITICAL RESULT CALLED TO, READ BACK BY AND VERIFIED WITH:  L.PONDEXTER,PHARMD 2359 06/21/18 M.CAMPBELL    Streptococcus species NOT DETECTED NOT DETECTED Final   Streptococcus agalactiae NOT DETECTED NOT DETECTED Final   Streptococcus pneumoniae NOT DETECTED NOT DETECTED Final   Streptococcus pyogenes NOT DETECTED NOT DETECTED Final   Acinetobacter baumannii NOT DETECTED NOT DETECTED Final   Enterobacteriaceae species NOT DETECTED NOT DETECTED Final   Enterobacter cloacae complex NOT DETECTED NOT DETECTED Final   Escherichia coli NOT DETECTED NOT DETECTED Final   Klebsiella oxytoca NOT DETECTED NOT DETECTED Final   Klebsiella pneumoniae NOT DETECTED NOT DETECTED Final   Proteus species NOT DETECTED NOT DETECTED Final   Serratia marcescens NOT DETECTED NOT DETECTED Final   Haemophilus influenzae NOT DETECTED NOT DETECTED Final   Neisseria meningitidis NOT DETECTED NOT DETECTED Final   Pseudomonas aeruginosa NOT DETECTED NOT DETECTED Final   Candida albicans NOT DETECTED NOT DETECTED Final   Candida glabrata NOT DETECTED NOT DETECTED Final   Candida krusei NOT DETECTED NOT DETECTED Final   Candida parapsilosis NOT DETECTED NOT DETECTED Final   Candida tropicalis NOT DETECTED NOT DETECTED Final    Comment:  Performed at [facility], [place] [address], [place], [place] [address]      Studies: Dg Chest Port 1 View  Result Date: 06/22/2018 CLINICAL DATA:  CHF, HTN, diabetes, r/o pneumonia EXAM: PORTABLE CHEST - 1 VIEW COMPARISON:  06/19/2018 FINDINGS: Central pulmonary vascular congestion. Moderate right and smaller left pleural effusions. Consolidation/atelectasis in the lung bases left greater than right. Stable cardiomegaly. Aortic Atherosclerosis (ICD10-170.0). Left subclavian AICD stable. Previous median sternotomy. No definite pneumothorax. DJD in bilateral shoulders. IMPRESSION: Stable cardiomegaly, vascular congestion, and pleural effusions. Electronically Signed   By: Lucrezia Europe M.D.   On: 06/22/2018 09:34    Scheduled Meds: . sodium chloride   Intravenous Once  . carvedilol  6.25 mg Oral BID  . ferrous sulfate  325 mg Oral BID WC  . folic acid  2 mg Oral Daily  . insulin aspart  0-9 Units Subcutaneous TID WC  . levETIRAcetam  500 mg Oral BID  . levothyroxine  100 mcg Oral Q0600  . magnesium oxide  400 mg Oral Daily  . pantoprazole  40 mg Oral Daily  . potassium chloride SA  20 mEq Oral Daily  . sertraline  50 mg Oral Daily  . sodium hypochlorite   Irrigation Daily    Continuous Infusions: . dextrose 5 % with KCl 20 mEq / L 20 mEq (06/22/18 1135)     LOS: 0 days     Alma Friendly, MD [facility]  If 7PM-7AM, please contact night-coverage [email or web address] 06/22/2018, 3:52 PM

## 2018-06-22 NOTE — Progress Notes (Addendum)
Initial Nutrition Assessment  DOCUMENTATION CODES:   Severe malnutrition in context of chronic illness, Underweight  INTERVENTION:   Provide Ensure Enlive po BID, each supplement provides 350 kcal and 20 grams of protein, if patient will accept.  NUTRITION DIAGNOSIS:   Severe Malnutrition related to chronic illness(end stage Alzheimer's Disease) as evidenced by severe fat depletion, severe muscle depletion.  GOAL:   Patient will meet greater than or equal to 90% of their needs  MONITOR:   PO intake, Labs, Weight trends, Skin, I & O's, GOC  REASON FOR ASSESSMENT:   Consult Assessment of nutrition requirement/status  ASSESSMENT:   84 y.o. female with past medical history significant for advanced end-stage Alzheimer dementia, myelodysplastic syndrome, transfusion dependent, CHF, type 2 diabetes, right BKA, seizure who was transferred from [facility] rehab for blood transfusion. Pt follows at [facility] per prior documentation and they were recommending hospice care (currently not on hospice care). Patient was not able to provide history, oriented to self only.  Patient with advanced Alzheimer's, unable to provide any history. Pt last seen by nutrition team during previous admission 11/1. At that time pt was eating minimally, had stage 2-3 pressure injuries and was diagnosed with severe malnutrition.  Pt currently refusing meals, if she does eat it is only bites. SLP attempted to evaluate today but pt was not compliant. Pt refusing PO intakes and medications.  Patient's pressure injuries have worsened to stage 3 to unstageable. Would benefit from nutritional supplementation if pt would accept PO.  Palliative care has been consulted for [place]. Will monitor for decisions. Per SLP note,  MOST form indicated pt does not want a feeding tube.   Per weight records, pt has lost 8 lb since 7/7 (7% wt loss x 5 months, insignificant for time frame).  Medications: Ferrous sulfate tablet BID, Folic  acid tablet daily, MAG-OX tablet daily, K-DUR tablet daily, D5 w/ KCl infusion at 75 ml/hr Labs reviewed: CBGs: 81-95 Elevated Na Low K   NUTRITION - FOCUSED PHYSICAL EXAM:    Most Recent Value  Orbital Region  Moderate depletion  Upper Arm Region  Severe depletion  Thoracic and Lumbar Region  Unable to assess  Buccal Region  Severe depletion  Temple Region  Severe depletion  Clavicle Bone Region  Severe depletion  Clavicle and Acromion Bone Region  Severe depletion  Scapular Bone Region  Unable to assess  Dorsal Hand  Severe depletion  Patellar Region  Severe depletion  Anterior Thigh Region  Severe depletion  Posterior Calf Region  Severe depletion  Edema (RD Assessment)  Mild       Diet Order:   Diet Order            DIET SOFT Room service appropriate? Yes; Fluid consistency: Thin  Diet effective now              EDUCATION NEEDS:   Not appropriate for education at this time  Skin:  Skin Assessment: Skin Integrity Issues: Skin Integrity Issues:: Stage III, Unstageable Stage III: right BKA stump Unstageable: sacrum  Last BM:  PTA  Height:   Ht Readings from Last 1 Encounters:  05/14/18 5\' 3"  (1.6 m)    Weight:   Wt Readings from Last 1 Encounters:  06/22/18 45.1 kg    Ideal Body Weight:  49.1 kg -adjusted for rt BKA  BMI:  Body mass index is 17.61 kg/m.  Estimated Nutritional Needs:   Kcal:  1250-1450  Protein:  60-70g  Fluid:  1.5L/day  Clayton Bibles, MS, RD, [place] Dietitian Pager: [phone] After Hours Pager: [phone]

## 2018-06-23 LAB — URINALYSIS, ROUTINE W REFLEX MICROSCOPIC
Bilirubin Urine: NEGATIVE
Glucose, UA: NEGATIVE mg/dL
Ketones, ur: NEGATIVE mg/dL
Nitrite: NEGATIVE
Protein, ur: 30 mg/dL — AB
Specific Gravity, Urine: 1.016 (ref 1.005–1.030)
WBC, UA: >50 WBC/hpf — ABNORMAL HIGH (ref 0–5)
pH: 7 (ref 5.0–8.0)

## 2018-06-23 LAB — CBC WITH DIFFERENTIAL/PLATELET
Abs Immature Granulocytes: 0.06 10*3/uL (ref 0.00–0.07)
Basophils Absolute: 0 10*3/uL (ref 0.0–0.1)
Basophils Relative: 0 %
EOS PCT: 0 %
Eosinophils Absolute: 0 10*3/uL (ref 0.0–0.5)
HCT: 32.2 % — ABNORMAL LOW (ref 36.0–46.0)
Hemoglobin: 9.8 g/dL — ABNORMAL LOW (ref 12.0–15.0)
Immature Granulocytes: 1 %
Lymphocytes Relative: 4 %
Lymphs Abs: 0.4 10*3/uL — ABNORMAL LOW (ref 0.7–4.0)
MCH: 32.2 pg (ref 26.0–34.0)
MCHC: 30.4 g/dL (ref 30.0–36.0)
MCV: 105.9 fL — ABNORMAL HIGH (ref 80.0–100.0)
Monocytes Absolute: 0.2 10*3/uL (ref 0.1–1.0)
Monocytes Relative: 2 %
Neutro Abs: 9.5 10*3/uL — ABNORMAL HIGH (ref 1.7–7.7)
Neutrophils Relative %: 93 %
Platelets: 97 10*3/uL — ABNORMAL LOW (ref 150–400)
RBC: 3.04 MIL/uL — ABNORMAL LOW (ref 3.87–5.11)
RDW: 18.4 % — ABNORMAL HIGH (ref 11.5–15.5)
WBC: 10.3 10*3/uL (ref 4.0–10.5)
nRBC: 0 % (ref 0.0–0.2)

## 2018-06-23 LAB — BASIC METABOLIC PANEL
Anion gap: 7 (ref 5–15)
BUN: 42 mg/dL — ABNORMAL HIGH (ref 8–23)
CO2: 29 mmol/L (ref 22–32)
Calcium: 8.3 mg/dL — ABNORMAL LOW (ref 8.9–10.3)
Chloride: 108 mmol/L (ref 98–111)
Creatinine, Ser: 0.6 mg/dL (ref 0.44–1.00)
GFR calc Af Amer: >60 mL/min (ref >60)
GFR calc non Af Amer: >60 mL/min (ref >60)
Glucose, Bld: 176 mg/dL — ABNORMAL HIGH (ref 70–99)
Potassium: 4.1 mmol/L (ref 3.5–5.1)
Sodium: 144 mmol/L (ref 135–145)

## 2018-06-23 LAB — GLUCOSE, CAPILLARY
GLUCOSE-CAPILLARY: 161 mg/dL — AB (ref 70–99)
Glucose-Capillary: 148 mg/dL — ABNORMAL HIGH (ref 70–99)
Glucose-Capillary: 156 mg/dL — ABNORMAL HIGH (ref 70–99)
Glucose-Capillary: 170 mg/dL — ABNORMAL HIGH (ref 70–99)

## 2018-06-23 LAB — CULTURE, BLOOD (ROUTINE X 2): Special Requests: ADEQUATE

## 2018-06-23 LAB — MRSA PCR SCREENING: MRSA by PCR: POSITIVE — AB

## 2018-06-23 MED ORDER — LEVETIRACETAM 100 MG/ML PO SOLN
500.0000 mg | Freq: Two times a day (BID) | ORAL | Status: DC
  Administered 2018-06-23 – 2018-06-26 (×2): 500 mg via ORAL
  Filled 2018-06-23 (×14): qty 5

## 2018-06-23 MED ORDER — SODIUM CHLORIDE 0.9 % IV SOLN
1.0000 g | INTRAVENOUS | Status: DC
  Administered 2018-06-23 – 2018-06-24 (×2): 1 g via INTRAVENOUS
  Filled 2018-06-23 (×2): qty 1

## 2018-06-23 NOTE — Progress Notes (Signed)
Patient ID: Jean Hodges, female   DOB: [birth year], 84 y.o.   MRN: [id number]   Thank you for consulting the Palliative Medicine Team at [facility] to meet your patient's and family's needs.   The reason that you asked [place] to see your patient is to discuss GOC and treatment plan, emotional support  The Surrogate decision make is:  Son   Jean Hodges   Your patient is able/unable to participate: unable    I left phone messages for Mr. Rome.  He did return my call unfortunately when I attempted to reach him again there was no pickup and I left another message.  Will continue to make contact in order to schedule a goals of care meeting.  Wadie Lessen NP  No charge

## 2018-06-23 NOTE — Care Management Note (Signed)
Case Management Note  Patient Details  Name: Jean Hodges MRN: [id number] Date of Birth: 1933-08-13  Subjective/Objective:                  84 y.o.femalewith past medical history significant for advanced end-stage Alzheimer dementia, myelodysplastic syndrome, transfusion dependent, CHF, type 2 diabetes, right BKA, seizure who was transferred from [facility] rehab for blood transfusion. Pt follows at [facility] per prior documentation and they were recommending hospice care (currently not on hospice care). Patient was not able to provide history, oriented to self only. Pt was able to deny chest pain, shortness of breath. Currently no evidence of active GI bleed. Pt admitted for further management.  Action/Plan: Following for progression of care and condition. Following for cm needs none present at this time. From [place] csw following Expected Discharge Date:  (unknown)               Expected Discharge Plan:  [name]  In-House Referral:  Clinical Social Work  Discharge planning Services  CM Consult  Post Acute Care Choice:    Choice offered to:     DME Arranged:    DME Agency:     HH Arranged:    [place] Agency:     Status of Service:  In process, will continue to follow  If discussed at Long Length of Stay Meetings, dates discussed:    Additional Comments:  Leeroy Cha, RN 06/23/2018, 10:22 AM

## 2018-06-23 NOTE — Progress Notes (Signed)
LCSW consulted for dc planning patient is from [facility].   Patient and family awaiting palliative consult.   LCSW will continue to follow for dc needs.   Carolin Coy [place]

## 2018-06-23 NOTE — Progress Notes (Signed)
PROGRESS NOTE    Jean Hodges  YBW:[id number] DOB: [birth year] DOA: 06/19/2018 PCP: Patient, No Pcp Per   Brief Narrative:  HPI from Dr Valera Castle Austinis a 84 y.o.femalewith past medical history significant for advanced end-stage Alzheimer dementia, myelodysplastic syndrome, transfusion dependent, CHF, type 2 diabetes, right BKA, seizure who was transferred from [facility] for blood transfusion. Pt follows at [facility] per prior documentation and they were recommending hospice care (currently not on hospice care). Patient was not able to provide history, oriented to self only. Pt was able to deny chest pain, shortness of breath. Currently no evidence of active GI bleed. Pt admitted for further management.  Per previous doc on 12/9 "Spoke to son in details on 06/21/18 about pts overall medical condition and very poor prognosis. Son has a strong belief in the christian faith and believes that he has to do everything possible to see his mother alive. I explained in details about DNR/FULL CODE and what they both entails. Also spoke to patient about my recommendation for DNR and even possible hospice. Son thanked me for all the information and would discuss with his sister. Son is also awaiting a call from Palliative team."  Assessment & Plan:   Active Problems:   Tonic-clonic seizure disorder (HCC)   Hypothyroidism   Type 2 diabetes mellitus with peripheral vascular disease (HCC)   PVD (peripheral vascular disease) ([place])   Alzheimer's dementia ([place])   Symptomatic anemia   Severe protein-calorie malnutrition (HCC)   Adult failure to thrive   Chronic systolic CHF (congestive heart failure) (HCC)   Anemia  Goals of Care: pt remains full code, but not taking PO and speech currently recommending NPO.  I was unable to reach son today, but spoke with daughter.  Discussed poor prognosis and palliative care consult.  Continue to follow.   Acute on chronic blood loss anemia likely 2/2  MDS Transfusion dependent Hemoglobin on admission 6.9, status post 2 units of PRBC Hb stable FOBT pending  Continue oral iron supplementation, folic acid Dr. Alvy Bimler will help arrange follow-up at the cancer center per previous provider. Monitor closely for any signs of bleeding Daily CBC  Leukocytosis Resolved Afebrile UA with WBC's, appears concerning for UTI - ceftriaxone - follow culture BC x2, 1 bottle grew staph coagulase negative, likely contaminant. Repeat BC X 2 pending Chest x-ray unremarkable for infection, but notes pulmonary edema, repeats still shows congestion Monitor for any signs of fever (temp to 100.1 on 12/7)  ??Acute metabolic encephalopathy Vs dementia Unsure if this is her baseline Work-up for any infectious process in progress CT head unremarkable for any acute process Monitor closely  Sacral unstageable ulcer, right BKA stump with stage II ulcer with pain Foul-smelling sacral ulcer with black necrotic tissue Wound care consulted, appreciate recs  Acute on chronic systolic heart failure Chest x-ray with pulmonary edema Echo on 10/2017 with EF of 20 to 25%, PA peak pressure of 44 mmHg Held IV Lasix for now as pt is not eating or drinking, continue Coreg Strict I's and O's, daily weights  Hypernatremia Improved, follow  Held lasix Daily BMP  Hypokalemia Replace prn  Thrombocytopenia: follow  Hypertension Only on Coreg Hydralazine PRN  ??Diabetes mellitus type 2 Not on any home meds, noted hyperglycemia on BMP 271, 217 A1c on 05/2018 was 5.9 SSI, accuchecks  Severe malnutrition Appears cachectic  SLP - recommending NPO and ice chips prn after oral care.  Apparently previous MOST form in Feb 2019 indicated no feeding  tube. dietitian consult Continue with Ensure  Hypothyroidism Continue with Synthroid  History of seizure Continue with Keppra  Goals of care discussion Palliative care consulted  DVT prophylaxis:  SCD's Code Status: full  Family Communication: daughter over phone Disposition Plan: pending goals of care discussion, plan for discharge from hospitali  Consultants:   Palliative care  Procedures:   none  Antimicrobials:  Anti-infectives (From admission, onward)   Start     Dose/Rate Route Frequency Ordered Stop   06/23/18 2300  cefTRIAXone (ROCEPHIN) 1 g in sodium chloride 0.9 % 100 mL IVPB     1 g 200 mL/hr over 30 Minutes Intravenous Every 24 hours 06/23/18 2210       Subjective: Moans.  Responds to voice.  Objective: Vitals:   06/22/18 0600 06/22/18 1420 06/22/18 2038 06/23/18 0405  BP:  (!) 143/55 (!) 147/56 (!) 133/45  Pulse:  81 79 84  Resp:  12 20 20   Temp:  98.1 F (36.7 C) 98.7 F (37.1 C) 98.4 F (36.9 C)  TempSrc:  Oral Oral Oral  SpO2:  97% 97% 95%  Weight: 45.1 kg   45.9 kg    Intake/Output Summary (Last 24 hours) at 06/23/2018 1102 Last data filed at 06/23/2018 1006 Gross per 24 hour  Intake 862.87 ml  Output 50 ml  Net 812.87 ml   Filed Weights   06/21/18 0507 06/22/18 0600 06/23/18 0405  Weight: 46.9 kg 45.1 kg 45.9 kg    Examination:  General exam: Appears calm and comfortable  Respiratory system: Clear to auscultation. Respiratory effort normal. Cardiovascular system: S1 & S2 heard, RRR.  Gastrointestinal system: Abdomen is nondistended, soft and nontender. No organomegaly or masses felt. Normal bowel sounds heard. Central nervous system: disoriented. No focal neurological deficits. Extremities: R BKA Skin: unstageable pressure ulcer to sacrum, stage III to R BKA stump Psychiatry: Judgement and insight appear normal. Mood & affect appropriate.     Data Reviewed: I have personally reviewed following labs and imaging studies  CBC: Recent Labs  Lab 06/19/18 1444 06/20/18 0612 06/21/18 0605 06/22/18 0602 06/23/18 0619  WBC 16.2* 13.4* 10.0 8.9 10.3  NEUTROABS 14.0*  --  8.6* 7.4 9.5*  HGB 6.9* 11.4* 10.7* 10.5* 9.8*  HCT  22.6* 35.6* 33.4* 34.5* 32.2*  MCV 107.6* 96.7 99.4 104.2* 105.9*  PLT 148* 107* 112* 102* 97*   Basic Metabolic Panel: Recent Labs  Lab 06/19/18 1444 06/20/18 0612 06/21/18 0605 06/22/18 0602 06/23/18 0619  NA 145 147* 147* 149* 144  K 3.9 3.6 3.3* 3.3* 4.1  CL 108 109 109 112* 108  CO2 30 31 31 30 29   GLUCOSE 271* 217* 172* 90 176*  BUN 33* 33* 38* 39* 42*  CREATININE 0.57 0.55 0.57 0.40* 0.60  CALCIUM 8.7* 8.8* 8.6* 8.5* 8.3*  MG  --   --  1.8  --   --    GFR: Estimated Creatinine Clearance: 39.3 mL/min (by C-G formula based on SCr of 0.6 mg/dL). Liver Function Tests: No results for input(s): AST, ALT, ALKPHOS, BILITOT, PROT, ALBUMIN in the last 168 hours. No results for input(s): LIPASE, AMYLASE in the last 168 hours. No results for input(s): AMMONIA in the last 168 hours. Coagulation Profile: No results for input(s): INR, PROTIME in the last 168 hours. Cardiac Enzymes: No results for input(s): CKTOTAL, CKMB, CKMBINDEX, TROPONINI in the last 168 hours. BNP (last 3 results) No results for input(s): PROBNP in the last 8760 hours. HbA1C: No results for input(s): HGBA1C in the  last 72 hours. CBG: Recent Labs  Lab 06/22/18 0806 06/22/18 1228 06/22/18 1646 06/22/18 2040 06/23/18 0804  GLUCAP 81 95 108* 125* 148*   Lipid Profile: No results for input(s): CHOL, HDL, LDLCALC, TRIG, CHOLHDL, LDLDIRECT in the last 72 hours. Thyroid Function Tests: No results for input(s): TSH, T4TOTAL, FREET4, T3FREE, THYROIDAB in the last 72 hours. Anemia Panel: No results for input(s): VITAMINB12, FOLATE, FERRITIN, TIBC, IRON, RETICCTPCT in the last 72 hours. Sepsis Labs: No results for input(s): PROCALCITON, LATICACIDVEN in the last 168 hours.  Recent Results (from the past 240 hour(s))  Culture, blood (routine x 2)     Status: None (Preliminary result)   Collection Time: 06/20/18  2:54 PM  Result Value Ref Range Status   Specimen Description   Final    BLOOD LEFT  HAND Performed at [facility] [address], [place], [place] [address]    Special Requests   Final    BOTTLES DRAWN AEROBIC ONLY Blood Culture adequate volume Performed at [facility] [address] [place], [place] [address]    Culture   Final    NO GROWTH 2 DAYS Performed at [facility] [address], [place], [place] [address]    Report Status PENDING  Incomplete  Culture, blood (routine x 2)     Status: Abnormal   Collection Time: 06/20/18  2:54 PM  Result Value Ref Range Status   Specimen Description   Final    BLOOD RIGHT HAND Performed at [facility] [address], [place], [place] [address]    Special Requests   Final    BOTTLES DRAWN AEROBIC ONLY Blood Culture adequate volume Performed at [facility] [address], [place], [place] [address]    Culture  Setup Time   Final    GRAM POSITIVE COCCI AEROBIC BOTTLE ONLY CRITICAL RESULT CALLED TO, READ BACK BY AND VERIFIED WITH: L.PONDEXTER,PHARMD 2359 06/21/18 M.CAMPBELL    Culture (A)  Final    STAPHYLOCOCCUS SPECIES (COAGULASE NEGATIVE) THE SIGNIFICANCE OF ISOLATING THIS ORGANISM FROM A SINGLE SET OF BLOOD CULTURES WHEN MULTIPLE SETS ARE DRAWN IS UNCERTAIN. PLEASE NOTIFY THE MICROBIOLOGY DEPARTMENT WITHIN ONE WEEK IF SPECIATION AND SENSITIVITIES ARE REQUIRED. Performed at [facility], [place] [address] [place],  [address]    Report Status 06/23/2018 FINAL  Final  Blood Culture ID Panel (Reflexed)     Status: Abnormal   Collection Time: 06/20/18  2:54 PM  Result Value Ref Range Status   Enterococcus species NOT DETECTED NOT DETECTED Final   Listeria monocytogenes NOT DETECTED NOT DETECTED Final   Staphylococcus species DETECTED (A) NOT DETECTED Final    Comment: Methicillin (oxacillin) resistant coagulase negative staphylococcus. Possible blood culture contaminant (unless isolated from more than one blood culture  draw or clinical case suggests pathogenicity). No antibiotic treatment is indicated for blood  culture contaminants. CRITICAL RESULT CALLED TO, READ BACK BY AND VERIFIED WITH: L.PONDEXTER,PHARMD 2359 06/21/18 M.CAMPBELL    Staphylococcus aureus (BCID) NOT DETECTED NOT DETECTED Final   Methicillin resistance DETECTED (A) NOT DETECTED Final    Comment: CRITICAL RESULT CALLED TO, READ BACK BY AND VERIFIED WITH: L.PONDEXTER,PHARMD 2359 06/21/18 M.CAMPBELL    Streptococcus species NOT DETECTED NOT DETECTED Final   Streptococcus agalactiae NOT DETECTED NOT DETECTED Final   Streptococcus pneumoniae NOT DETECTED NOT DETECTED Final   Streptococcus pyogenes NOT DETECTED NOT DETECTED Final   Acinetobacter baumannii NOT DETECTED NOT DETECTED Final  Enterobacteriaceae species NOT DETECTED NOT DETECTED Final   Enterobacter cloacae complex NOT DETECTED NOT DETECTED Final   Escherichia coli NOT DETECTED NOT DETECTED Final   Klebsiella oxytoca NOT DETECTED NOT DETECTED Final   Klebsiella pneumoniae NOT DETECTED NOT DETECTED Final   Proteus species NOT DETECTED NOT DETECTED Final   Serratia marcescens NOT DETECTED NOT DETECTED Final   Haemophilus influenzae NOT DETECTED NOT DETECTED Final   Neisseria meningitidis NOT DETECTED NOT DETECTED Final   Pseudomonas aeruginosa NOT DETECTED NOT DETECTED Final   Candida albicans NOT DETECTED NOT DETECTED Final   Candida glabrata NOT DETECTED NOT DETECTED Final   Candida krusei NOT DETECTED NOT DETECTED Final   Candida parapsilosis NOT DETECTED NOT DETECTED Final   Candida tropicalis NOT DETECTED NOT DETECTED Final    Comment: Performed at [facility], [place] [address], [place], [place] [address]         Radiology Studies: Dg Chest [facility] 1 View  Result Date: 06/22/2018 CLINICAL DATA:  CHF, HTN, diabetes, r/o pneumonia EXAM: PORTABLE CHEST - 1 VIEW COMPARISON:  06/19/2018 FINDINGS: Central pulmonary vascular congestion. Moderate right and smaller left  pleural effusions. Consolidation/atelectasis in the lung bases left greater than right. Stable cardiomegaly. Aortic Atherosclerosis (ICD10-170.0). Left subclavian AICD stable. Previous median sternotomy. No definite pneumothorax. DJD in bilateral shoulders. IMPRESSION: Stable cardiomegaly, vascular congestion, and pleural effusions. Electronically Signed   By: Lucrezia Europe M.D.   On: 06/22/2018 09:34        Scheduled Meds: . sodium chloride   Intravenous Once  . carvedilol  6.25 mg Oral BID  . ferrous sulfate  325 mg Oral BID WC  . folic acid  2 mg Oral Daily  . insulin aspart  0-9 Units Subcutaneous TID WC  . levETIRAcetam  500 mg Oral BID  . levothyroxine  100 mcg Oral Q0600  . magnesium oxide  400 mg Oral Daily  . pantoprazole  40 mg Oral Daily  . potassium chloride SA  20 mEq Oral Daily  . sertraline  50 mg Oral Daily  . sodium hypochlorite   Irrigation Daily   Continuous Infusions:   LOS: 1 day    Time spent: over 30 min    Fayrene Helper, MD [facility] Pager [phone]  If 7PM-7AM, please contact night-coverage [email or web address] Password [email or web address] 06/23/2018, 11:02 AM

## 2018-06-24 ENCOUNTER — Telehealth: Payer: Self-pay | Admitting: Internal Medicine

## 2018-06-24 DIAGNOSIS — D619 Aplastic anemia, unspecified: Secondary | ICD-10-CM

## 2018-06-24 DIAGNOSIS — Z515 Encounter for palliative care: Secondary | ICD-10-CM

## 2018-06-24 LAB — URINE CULTURE

## 2018-06-24 LAB — CBC
HCT: 34.6 % — ABNORMAL LOW (ref 36.0–46.0)
Hemoglobin: 10.5 g/dL — ABNORMAL LOW (ref 12.0–15.0)
MCH: 31.3 pg (ref 26.0–34.0)
MCHC: 30.3 g/dL (ref 30.0–36.0)
MCV: 103.3 fL — ABNORMAL HIGH (ref 80.0–100.0)
Platelets: 90 10*3/uL — ABNORMAL LOW (ref 150–400)
RBC: 3.35 MIL/uL — ABNORMAL LOW (ref 3.87–5.11)
RDW: 18.6 % — ABNORMAL HIGH (ref 11.5–15.5)
WBC: 12.4 10*3/uL — ABNORMAL HIGH (ref 4.0–10.5)
nRBC: 0 % (ref 0.0–0.2)

## 2018-06-24 LAB — COMPREHENSIVE METABOLIC PANEL
ALT: 40 U/L (ref 0–44)
AST: 44 U/L — AB (ref 15–41)
Albumin: 1.9 g/dL — ABNORMAL LOW (ref 3.5–5.0)
Alkaline Phosphatase: 81 U/L (ref 38–126)
Anion gap: 7 (ref 5–15)
BUN: 45 mg/dL — ABNORMAL HIGH (ref 8–23)
CALCIUM: 8.2 mg/dL — AB (ref 8.9–10.3)
CHLORIDE: 111 mmol/L (ref 98–111)
CO2: 28 mmol/L (ref 22–32)
Creatinine, Ser: 0.62 mg/dL (ref 0.44–1.00)
GFR calc Af Amer: >60 mL/min (ref >60)
GFR calc non Af Amer: >60 mL/min (ref >60)
Glucose, Bld: 157 mg/dL — ABNORMAL HIGH (ref 70–99)
Potassium: 4.2 mmol/L (ref 3.5–5.1)
Sodium: 146 mmol/L — ABNORMAL HIGH (ref 135–145)
Total Bilirubin: 0.8 mg/dL (ref 0.3–1.2)
Total Protein: 5.2 g/dL — ABNORMAL LOW (ref 6.5–8.1)

## 2018-06-24 LAB — GLUCOSE, CAPILLARY
Glucose-Capillary: 137 mg/dL — ABNORMAL HIGH (ref 70–99)
Glucose-Capillary: 116 mg/dL — ABNORMAL HIGH (ref 70–99)
Glucose-Capillary: 125 mg/dL — ABNORMAL HIGH (ref 70–99)
Glucose-Capillary: 103 mg/dL — ABNORMAL HIGH (ref 70–99)

## 2018-06-24 LAB — PROCALCITONIN: Procalcitonin: 0.41 ng/mL

## 2018-06-24 LAB — MAGNESIUM: MAGNESIUM: 1.8 mg/dL (ref 1.7–2.4)

## 2018-06-24 MED ORDER — ACETAMINOPHEN 325 MG PO TABS: 650.0000 mg | ORAL_TABLET | Freq: Four times a day (QID) | ORAL | Status: DC | PRN

## 2018-06-24 MED ORDER — LIP MEDEX EX OINT
TOPICAL_OINTMENT | CUTANEOUS | Status: AC
  Filled 2018-06-24: qty 7

## 2018-06-24 MED ORDER — DEXTROSE-NACL 5-0.45 % IV SOLN
INTRAVENOUS | Status: AC
  Administered 2018-06-24 – 2018-06-26 (×3): via INTRAVENOUS

## 2018-06-24 NOTE — Progress Notes (Addendum)
dsg change performed to sacral wound. Pads changed to right BKA stump and left hip. Pt in severe pain with turning  and dsg change. Medicated after.

## 2018-06-24 NOTE — Progress Notes (Signed)
Patient followed by palliative at facility.   LCSW will continue to follow for dc needs.  [place]

## 2018-06-24 NOTE — Telephone Encounter (Signed)
Phoned son Jean Hodges and listed numbers in an attempt to re-discuss goals of care for patient Jean Hodges and to inform him that the Inpatient Palliative Care department has been attempting to contact him again related to [name].  Voicemail messages were left at both of the following numbers to return the call to this NP at [facility].   Shacara Cozine (son) [phone]  Attempted to contact daughter [name] however, the listed telephone number was not accurate.   Sheilah Pigeon, NP of inpatient palliative care team was contacted and updated on attempts to also reach pt's son and previous conversations with him and his decisions to request full scope of treatment including Full Code for [name]. I will re-attempt to contact son.

## 2018-06-24 NOTE — Progress Notes (Signed)
Patient ID: Jean Hodges, female   DOB: [birth year], 84 y.o.   MRN: [id number]   Thank you for consulting the Palliative Medicine Team at [facility] to meet your patient's and family's needs.   The reason that you asked [place] to see your patient is to discuss GOC and treatment plan, emotional support  The Surrogate decision make is:  Son/   Jean Hodges   Your patient is able/unable to participate: unable    I have left multiple phone messages for Mr. Santillo.  Await callback.   Will continue to attempt to make contact in order to schedule a goals of care meeting.  Wadie Lessen NP  No charge

## 2018-06-24 NOTE — Progress Notes (Signed)
PROGRESS NOTE    Jean Hodges  QQP:[id number] DOB: May 17, 1934 DOA: 06/19/2018 PCP: Patient, No Pcp Per    Brief Narrative:  84 year old with past medical history relevant for severe Alzheimer's dementia, protein calorie malnutrition/failure to thrive, hypothyroidism, Large sacral decubitus ulcers, hypothyroidism, seizure disorder, peripheral vascular disease, depression/anxiety, transfusion dependent myelodysplastic syndrome admitted with anemia with a hospital course complicated by fevers.   Assessment & Plan:   Active Problems:   Tonic-clonic seizure disorder (HCC)   Hypothyroidism   Type 2 diabetes mellitus with peripheral vascular disease (HCC)   PVD (peripheral vascular disease) ([place])   Alzheimer's dementia ([place])   Symptomatic anemia   Severe protein-calorie malnutrition (HCC)   Adult failure to thrive   Chronic systolic CHF (congestive heart failure) (HCC)   Sacral decubitus ulcer   Anemia   #) Fevers: Developed fevers to T-max of 103.  She is also noted to be tachycardic during these fevers.  Unfortunately she cannot take acetaminophen due to an unspecified allergy and she cannot take NSAIDs due to her thrombocytopenia.  Suspect most likely these are related to her myelodysplastic syndrome though she is at high risk for having serious bacterial infections as she does have fairly large unstageable pressure ulcers and is fairly debilitated. - Blood cultures from 06/20/2018 1 out of 2 growing coagulase-negative staph -Blood cultures from 06/22/2018 no growth to date -We will order procalcitonin -Urine culture appears to be contaminated with multiple species, repeat from 06/23/2018 pending -Continue IV ceftriaxone for possible UTI  #) New onset atrial fibrillation: Patient developed atrial fibrillation today in the setting of fevers. -Not a candidate for anticoagulation - Continue beta-blocker  #) Presumed myelodysplastic syndrome complicated by recalcitrant anemia: Patient  apparently is refused biopsy for this myelodysplastic syndrome though hospice was recommended by [facility]. -Status post 2 units packed red blood cells, patient is transfusion dependent - Continue iron and folic acid supplementation  #) Advanced Alzheimer's dementia: Patient apparently presents from skilled nursing facility.  There have been multiple goals of care discussion documented in the chart including this hospitalization and the prior 1 however it appears the son continues to refer to talking with the sister but does not ever make a decision.  Patient has extremely poor prognosis with a transfusion dependence, extremely poor quality of life.  Additionally she is currently not eating as she is not awake enough for this. 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  Echo on 11/07/2017 shows EF of 20 to 25% with moderate to severe MR as well as evidence of pulmonary hypertension with moderate to severe tricuspid regurgitation -Holding diuresis in the setting of hyponatremia  #) Peripheral vascular disease/coronary artery disease status post CABG: - Continue carvedilol 6.25 mg twice daily  #) Seizure disorder: -Continue levetiracetam 500 mg twice daily  #) Hypothyroidism: -Continue levothyroxine 100 mcg  Fluids: Gentle IV fluids Electrolytes: Monitor and supplement Nutrition: Per above  Prophylaxis: SCDs  Disposition: Pending discussion about goals of care  Full code   Consultants:   None  Procedures:    None  Antimicrobials:  IV ceftriaxone started 06/23/2018  Subjective: This morning patient is minimally responsive.  She has been so during her hospitalization.  She apparently has been febrile but unfortunately cannot receive Tylenol due to a unspecified allergy and cannot receive NSAIDs due to thrombocytopenia from her mild myelodysplastic syndrome.  She does not appear to be uncomfortable but does only moan when held by the nursing staff.  Objective: Vitals:   06/24/18 0957 06/24/18 1003 06/24/18 1033 06/24/18 1100  BP: (!) 105/23 (!) 130/53 (!) 126/44 (!) 134/52  Pulse: 94 94 89 85  Resp: 20  19 (!) 24  Temp: (!) 102.1 F (38.9 C)  (!) 101.1 F (38.4 C) 99.7 F (37.6 C)  TempSrc:      SpO2: 96% 96% 97% 97%  Weight:        Intake/Output Summary (Last 24 hours) at 06/24/2018 1213 Last data filed at 06/24/2018 0834 Gross per 24 hour  Intake 0 ml  Output -  Net 0 ml   Filed Weights   06/21/18 0507 06/22/18 0600 06/23/18 0405  Weight: 46.9 kg 45.1 kg 45.9 kg    Examination:  General exam: Somnolent, responsive only to painful stimuli Respiratory system: Anterior lung sounds clear, scattered rhonchi Cardiovascular system: Tachycardic, irregularly irregular Gastrointestinal system: Soft, nondistended, no rebound or guarding. Central nervous system: Not alert or oriented, not following commands Extremities: 3+ lower extremity edema. Skin: Large decubitus ulcers Psychiatry: Unable to assess due to medical condition    Data Reviewed: I have personally reviewed following labs and imaging studies  CBC: Recent Labs  Lab 06/19/18 1444 06/20/18 0612 06/21/18 0605 06/22/18 0602 06/23/18 0619 06/24/18 0558  WBC 16.2* 13.4* 10.0 8.9 10.3 12.4*  NEUTROABS 14.0*  --  8.6* 7.4 9.5*  --   HGB 6.9* 11.4* 10.7* 10.5* 9.8* 10.5*  HCT 22.6* 35.6* 33.4* 34.5* 32.2* 34.6*  MCV 107.6* 96.7 99.4 104.2* 105.9* 103.3*  PLT 148* 107* 112* 102* 97* 90*   Basic Metabolic  Panel: Recent Labs  Lab 06/20/18 0612 06/21/18 0605 06/22/18 0602 06/23/18 0619 06/24/18 0558  NA 147* 147* 149* 144 146*  K 3.6 3.3* 3.3* 4.1 4.2  CL 109 109 112* 108 111  CO2 31 31 30 29 28   GLUCOSE 217* 172* 90 176* 157*  BUN 33* 38* 39* 42* 45*  CREATININE 0.55 0.57 0.40* 0.60 0.62  CALCIUM 8.8* 8.6* 8.5* 8.3* 8.2*  MG  --  1.8  --   --  1.8   GFR: Estimated Creatinine Clearance: 39.3 mL/min (by C-G formula based on SCr of 0.62 mg/dL). Liver Function Tests: Recent Labs  Lab 06/24/18 0558  AST 44*  ALT 40  ALKPHOS 81  BILITOT 0.8  PROT 5.2*  ALBUMIN 1.9*   No results for input(s): LIPASE, AMYLASE in the last 168 hours. No results for input(s): AMMONIA in the last 168 hours. Coagulation Profile: No results for input(s): INR, PROTIME in the last 168 hours. Cardiac Enzymes: No results for input(s): CKTOTAL, CKMB, CKMBINDEX, TROPONINI in the last 168 hours. BNP (last 3 results) No results for input(s): PROBNP in the last 8760 hours. HbA1C: No results for input(s): HGBA1C in the last 72 hours. CBG: Recent Labs  Lab 06/23/18 0804 06/23/18 1154 06/23/18 1552 06/23/18 2047 06/24/18 0736  GLUCAP 148* 156* 161* 170* 137*   Lipid Profile: No results for input(s): CHOL, HDL, LDLCALC, TRIG, CHOLHDL, LDLDIRECT in the last 72 hours. Thyroid Function Tests: No results for input(s): TSH, T4TOTAL, FREET4, T3FREE, THYROIDAB in the last 72 hours. Anemia Panel: No results  for input(s): VITAMINB12, FOLATE, FERRITIN, TIBC, IRON, RETICCTPCT in the last 72 hours. Sepsis Labs: No results for input(s): PROCALCITON, LATICACIDVEN in the last 168 hours.  Recent Results (from the past 240 hour(s))  Urine Culture     Status: Abnormal   Collection Time: 06/20/18  9:44 AM  Result Value Ref Range Status   Specimen Description   Final    URINE, CLEAN CATCH Performed at [facility], [place] [address], [place], [place] [address]    Special Requests   Final     NONE Performed at [facility], [place] [address] [place], [place] [address]    Culture MULTIPLE SPECIES PRESENT, SUGGEST RECOLLECTION (A)  Final   Report Status 06/24/2018 FINAL  Final  Culture, blood (routine x 2)     Status: None (Preliminary result)   Collection Time: 06/20/18  2:54 PM  Result Value Ref Range Status   Specimen Description   Final    BLOOD LEFT HAND Performed at [facility] [place] [address], [place], [place] [address]    Special Requests   Final    BOTTLES DRAWN AEROBIC ONLY Blood Culture adequate volume Performed at [place] [place] [address], [place], [place] [address]    Culture   Final    NO GROWTH 3 DAYS Performed at [facility], [place] [address] [place], [place] [address]    Report Status PENDING  Incomplete  Culture, blood (routine x 2)     Status: Abnormal   Collection Time: 06/20/18  2:54 PM  Result Value Ref Range Status   Specimen Description   Final    BLOOD RIGHT HAND Performed at [place] [address] [place], [place] [address]    Special Requests   Final    BOTTLES DRAWN AEROBIC ONLY Blood Culture adequate volume Performed at [place] [address] [place], [place] [address]    Culture  Setup Time   Final    GRAM POSITIVE COCCI AEROBIC BOTTLE ONLY CRITICAL RESULT CALLED TO, READ BACK BY AND VERIFIED WITH: L.PONDEXTER,PHARMD 2359 06/21/18 M.CAMPBELL    Culture (A)  Final    STAPHYLOCOCCUS SPECIES (COAGULASE NEGATIVE) THE SIGNIFICANCE OF ISOLATING THIS ORGANISM FROM A SINGLE SET OF BLOOD CULTURES WHEN MULTIPLE SETS ARE DRAWN IS UNCERTAIN. PLEASE NOTIFY THE MICROBIOLOGY DEPARTMENT WITHIN ONE WEEK IF SPECIATION AND SENSITIVITIES ARE REQUIRED. Performed at [facility], [place] [address], [place],  [address]    Report Status 06/23/2018 FINAL  Final  Blood Culture ID Panel (Reflexed)     Status: Abnormal   Collection  Time: 06/20/18  2:54 PM  Result Value Ref Range Status   Enterococcus species NOT DETECTED NOT DETECTED Final   Listeria monocytogenes NOT DETECTED NOT DETECTED Final   Staphylococcus species DETECTED (A) NOT DETECTED Final    Comment: Methicillin (oxacillin) resistant coagulase negative staphylococcus. Possible blood culture contaminant (unless isolated from more than one blood culture draw or clinical case suggests pathogenicity). No antibiotic treatment is indicated for blood  culture contaminants. CRITICAL RESULT CALLED TO, READ BACK BY AND VERIFIED WITH: L.PONDEXTER,PHARMD 2359 06/21/18 M.CAMPBELL    Staphylococcus aureus (BCID) NOT DETECTED NOT DETECTED Final   Methicillin resistance DETECTED (A) NOT DETECTED Final    Comment: CRITICAL RESULT CALLED TO, READ BACK BY AND VERIFIED WITH: L.PONDEXTER,PHARMD 2359 06/21/18 M.CAMPBELL    Streptococcus species NOT DETECTED NOT DETECTED Final   Streptococcus agalactiae NOT DETECTED NOT DETECTED Final  Streptococcus pneumoniae NOT DETECTED NOT DETECTED Final   Streptococcus pyogenes NOT DETECTED NOT DETECTED Final   Acinetobacter baumannii NOT DETECTED NOT DETECTED Final   Enterobacteriaceae species NOT DETECTED NOT DETECTED Final   Enterobacter cloacae complex NOT DETECTED NOT DETECTED Final   Escherichia coli NOT DETECTED NOT DETECTED Final   Klebsiella oxytoca NOT DETECTED NOT DETECTED Final   Klebsiella pneumoniae NOT DETECTED NOT DETECTED Final   Proteus species NOT DETECTED NOT DETECTED Final   Serratia marcescens NOT DETECTED NOT DETECTED Final   Haemophilus influenzae NOT DETECTED NOT DETECTED Final   Neisseria meningitidis NOT DETECTED NOT DETECTED Final   Pseudomonas aeruginosa NOT DETECTED NOT DETECTED Final   Candida albicans NOT DETECTED NOT DETECTED Final   Candida glabrata NOT DETECTED NOT DETECTED Final   Candida krusei NOT DETECTED NOT DETECTED Final   Candida parapsilosis NOT DETECTED NOT DETECTED Final   Candida  tropicalis NOT DETECTED NOT DETECTED Final    Comment: Performed at [facility], [place] [address] [place], [place] [address]  Culture, blood (routine x 2)     Status: None (Preliminary result)   Collection Time: 06/22/18  9:29 AM  Result Value Ref Range Status   Specimen Description   Final    BLOOD LEFT HAND Performed at [facility] [address] [place], [place] [address]    Special Requests   Final    BOTTLES DRAWN AEROBIC AND ANAEROBIC Blood Culture adequate volume Performed at [place] [address] [place], [place] [address]    Culture   Final    NO GROWTH 1 DAY Performed at [facility], [place] [address], [place], [place] [address]    Report Status PENDING  Incomplete  Culture, blood (routine x 2)     Status: None (Preliminary result)   Collection Time: 06/22/18  9:29 AM  Result Value Ref Range Status   Specimen Description   Final    BLOOD RIGHT ARM Performed at [facility] [address], [place], [place] [address]    Special Requests   Final    BOTTLES DRAWN AEROBIC ONLY Blood Culture results may not be optimal due to an inadequate volume of blood received in culture bottles Performed at [place] [address], [place], [place] [address]    Culture   Final    NO GROWTH 1 DAY Performed at [facility], [place] [address], [place], [place] [address]    Report Status PENDING  Incomplete  MRSA PCR Screening     Status: Abnormal   Collection Time: 06/23/18  9:11 AM  Result Value Ref Range Status   MRSA by PCR POSITIVE (A) NEGATIVE Final    Comment:        The GeneXpert MRSA Assay (FDA approved for NASAL specimens only), is one component of a comprehensive MRSA colonization surveillance program. It is not intended to diagnose MRSA infection nor to guide or monitor treatment for MRSA infections. RESULT CALLED TO, READ BACK BY AND VERIFIED WITH: FRASER,B @ 1158  ON 121019 BY POTEAT,S Performed at [facility] [address], [place], [place] [address]          Radiology Studies: No results found.      Scheduled Meds: . sodium chloride   Intravenous Once  . carvedilol  6.25 mg Oral BID  . ferrous sulfate  325 mg Oral BID WC  . folic acid  2 mg  Oral Daily  . insulin aspart  0-9 Units Subcutaneous TID WC  . levETIRAcetam  500 mg Oral BID  . levothyroxine  100 mcg Oral Q0600  . magnesium oxide  400 mg Oral Daily  . pantoprazole  40 mg Oral Daily  . potassium chloride SA  20 mEq Oral Daily  . sertraline  50 mg Oral Daily  . sodium hypochlorite   Irrigation Daily   Continuous Infusions: . cefTRIAXone (ROCEPHIN)  IV 1 g (06/23/18 2359)  . dextrose 5 % and 0.45% NaCl 50 mL/hr at 06/24/18 0855     LOS: 2 days    Time spent: [place], MD [facility]  If 7PM-7AM, please contact night-coverage [email or web address] Password [email or web address] 06/24/2018, 12:13 PM

## 2018-06-25 LAB — BASIC METABOLIC PANEL
Anion gap: 6 (ref 5–15)
BUN: 47 mg/dL — ABNORMAL HIGH (ref 8–23)
CO2: 25 mmol/L (ref 22–32)
Calcium: 8 mg/dL — ABNORMAL LOW (ref 8.9–10.3)
Chloride: 114 mmol/L — ABNORMAL HIGH (ref 98–111)
Creatinine, Ser: 0.53 mg/dL (ref 0.44–1.00)
GFR calc Af Amer: >60 mL/min (ref >60)
GFR calc non Af Amer: >60 mL/min (ref >60)
Glucose, Bld: 167 mg/dL — ABNORMAL HIGH (ref 70–99)
Potassium: 3.3 mmol/L — ABNORMAL LOW (ref 3.5–5.1)
Sodium: 145 mmol/L (ref 135–145)

## 2018-06-25 LAB — CULTURE, BLOOD (ROUTINE X 2)
Culture: NO GROWTH
SPECIAL REQUESTS: ADEQUATE

## 2018-06-25 LAB — CBC
HCT: 32.4 % — ABNORMAL LOW (ref 36.0–46.0)
Hemoglobin: 9.7 g/dL — ABNORMAL LOW (ref 12.0–15.0)
MCH: 31.5 pg (ref 26.0–34.0)
MCHC: 29.9 g/dL — ABNORMAL LOW (ref 30.0–36.0)
MCV: 105.2 fL — ABNORMAL HIGH (ref 80.0–100.0)
Platelets: 65 10*3/uL — ABNORMAL LOW (ref 150–400)
RBC: 3.08 MIL/uL — ABNORMAL LOW (ref 3.87–5.11)
RDW: 18.6 % — ABNORMAL HIGH (ref 11.5–15.5)
WBC: 7.2 10*3/uL (ref 4.0–10.5)
nRBC: 0 % (ref 0.0–0.2)

## 2018-06-25 LAB — URINE CULTURE

## 2018-06-25 LAB — GLUCOSE, CAPILLARY
GLUCOSE-CAPILLARY: 131 mg/dL — AB (ref 70–99)
Glucose-Capillary: 111 mg/dL — ABNORMAL HIGH (ref 70–99)
Glucose-Capillary: 122 mg/dL — ABNORMAL HIGH (ref 70–99)
Glucose-Capillary: 140 mg/dL — ABNORMAL HIGH (ref 70–99)
Glucose-Capillary: 157 mg/dL — ABNORMAL HIGH (ref 70–99)
Glucose-Capillary: 159 mg/dL — ABNORMAL HIGH (ref 70–99)

## 2018-06-25 LAB — PROCALCITONIN: Procalcitonin: 0.51 ng/mL

## 2018-06-25 MED ORDER — VANCOMYCIN HCL 500 MG IV SOLR
500.0000 mg | INTRAVENOUS | Status: AC
  Administered 2018-06-26: 500 mg via INTRAVENOUS
  Filled 2018-06-25: qty 500

## 2018-06-25 MED ORDER — SODIUM CHLORIDE 0.9 % IV SOLN
2.0000 g | Freq: Once | INTRAVENOUS | Status: AC
  Administered 2018-06-25: 2 g via INTRAVENOUS
  Filled 2018-06-25: qty 2

## 2018-06-25 MED ORDER — MUPIROCIN 2 % EX OINT
1.0000 "application " | TOPICAL_OINTMENT | Freq: Two times a day (BID) | CUTANEOUS | Status: DC
  Administered 2018-06-25 – 2018-06-29 (×8): 1 via NASAL
  Filled 2018-06-25 (×2): qty 22

## 2018-06-25 MED ORDER — SODIUM CHLORIDE 0.9 % IV SOLN
1.0000 g | Freq: Two times a day (BID) | INTRAVENOUS | Status: DC
  Administered 2018-06-25 – 2018-06-29 (×8): 1 g via INTRAVENOUS
  Filled 2018-06-25 (×9): qty 1

## 2018-06-25 MED ORDER — VANCOMYCIN HCL IN DEXTROSE 1-5 GM/200ML-% IV SOLN
1000.0000 mg | Freq: Once | INTRAVENOUS | Status: AC
  Administered 2018-06-25: 1000 mg via INTRAVENOUS
  Filled 2018-06-25: qty 200

## 2018-06-25 MED ORDER — CHLORHEXIDINE GLUCONATE CLOTH 2 % EX PADS
6.0000 | MEDICATED_PAD | Freq: Every day | CUTANEOUS | Status: DC
  Administered 2018-06-26 – 2018-06-29 (×4): 6 via TOPICAL

## 2018-06-25 MED ORDER — SODIUM CHLORIDE 0.9 % IV SOLN: 1.0000 g | Freq: Three times a day (TID) | INTRAVENOUS | Status: DC

## 2018-06-25 NOTE — Care Management Important Message (Signed)
Important Message  Patient Details  Name: Xin Klawitter MRN: [id number] Date of Birth: 06-17-1934   Medicare Important Message Given:  Yes    Kerin Salen 06/25/2018, 12:40 [place] Message  Patient Details  Name: Calleen Alvis MRN: [id number] Date of Birth: [birth year]   Medicare Important Message Given:  Yes    Kerin Salen 06/25/2018, 12:40 PM

## 2018-06-25 NOTE — Progress Notes (Signed)
PROGRESS NOTE    Jean [place]  GUR:[id number] DOB: [birth year] DOA: 06/19/2018 PCP: Patient, No Pcp Per    Brief Narrative:  84 year old with past medical history relevant for severe Alzheimer's dementia, protein calorie malnutrition/failure to thrive, hypothyroidism, Large sacral decubitus ulcers, hypothyroidism, seizure disorder, peripheral vascular disease, depression/anxiety, transfusion dependent myelodysplastic syndrome admitted with anemia with a hospital course complicated by fevers.   Assessment & Plan:   Active Problems:   Tonic-clonic seizure disorder (HCC)   Hypothyroidism   Type 2 diabetes mellitus with peripheral vascular disease (HCC)   PVD (peripheral vascular disease) ([place])   Alzheimer's dementia ([place])   Symptomatic anemia   Severe protein-calorie malnutrition (HCC)   Adult failure to thrive   Chronic systolic CHF (congestive heart failure) (HCC)   Sacral decubitus ulcer   Anemia   #) Fevers: Developed fevers to T-max of 103.  Patient continues to be febrile. - Blood cultures from 06/20/2018 1 out of 2 growing coagulase-negative staph -Blood cultures from 06/22/2018 no growth to date -Repeat blood cultures on 06/25/2018 could not be drawn due to poor -Procalcitonin is elevated and rising -Urine culture appears to be contaminated with multiple species, repeat from 06/23/2018 pending -Continue IV cefepime started 06/24/2018  #)  atrial fibrillation: Patient developed atrial fibrillation today in the setting of fevers. -Not a candidate for anticoagulation - Continue beta-blocker  #) Presumed myelodysplastic syndrome complicated by recalcitrant anemia: Patient apparently is refused biopsy for this myelodysplastic syndrome though hospice was recommended by [facility]. -Status post 2 units packed red blood cells, patient is transfusion dependent - Continue iron and folic acid supplementation  #) Advanced Alzheimer's dementia: Somewhat more awake and alert  today.  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  Echo on 11/07/2017 shows EF of 20 to 25% with moderate to severe MR as well as evidence of pulmonary hypertension with moderate to severe tricuspid regurgitation -Holding diuresis in the setting of hypernatremia  #) Peripheral vascular disease/coronary artery disease status post CABG: - Continue carvedilol 6.25 mg twice daily  #) Seizure disorder: -Continue levetiracetam 500 mg twice daily  #) Hypothyroidism: -Continue levothyroxine 100 mcg  Fluids: Gentle IV fluids Electrolytes: Monitor and supplement Nutrition: Per above  Prophylaxis: SCDs  Disposition: Pending discussion about goals of care  Full code   Consultants:   None  Procedures:   None  Antimicrobials:  IV ceftriaxone started 06/23/2018   Subjective: This morning multiple phlebotomist have attempted to get the blood from the patient.  She only moans nonspecifically when touched.  She is completely nonresponsive to any questions or treatments.  Had extensive discussion with the son yesterday about her extremely poor prognosis and her very poor quality of life.  He continues to report that he wants everything done as he reports this would be "giving up".  Attempted to explain that she is not a candidate for any further invasive procedures or surgeries.  Additionally now no more blood can be drawn off of her.  We will have palliative care discussed with the son and reevaluate.  Objective: Vitals:  06/24/18 1926 06/24/18 2220 06/25/18 0420 06/25/18 0500  BP: (!) 125/50 (!) 131/53 (!) 131/54   Pulse: 92 86 90   Resp: 15 16 16    Temp: 98.6 F (37 C) 97.9 F (36.6 C) 98.4 F (36.9 C)   TempSrc: Oral Oral Oral   SpO2: 99% 99% 100%   Weight:    47.7 kg    Intake/Output Summary (Last 24 hours) at 06/25/2018 1154 Last data filed at 06/25/2018 1005 Gross per 24 hour  Intake 1078.32 ml  Output -  Net 1078.32 ml   Filed Weights   06/22/18 0600 06/23/18 0405 06/25/18 0500  Weight: 45.1 kg 45.9 kg 47.7 kg    Examination:  General exam: Moaning in pain Respiratory system: Anterior lung sounds clear, scattered rhonchi Cardiovascular system: Tachycardic, irregularly irregular Gastrointestinal system: Soft, nondistended, no rebound or guarding. Central nervous system: Not alert or oriented, not following commands Extremities: 3+ lower extremity edema. Skin: Large decubitus ulcers, multiple large contusions over her friable skin Psychiatry: Unable to assess due to medical condition    Data Reviewed: I have personally reviewed following labs and imaging studies  CBC: Recent Labs  Lab 06/19/18 1444 06/20/18 0612 06/21/18 0605 06/22/18 0602 06/23/18 0619 06/24/18 0558  WBC 16.2* 13.4* 10.0 8.9 10.3 12.4*  NEUTROABS 14.0*  --  8.6* 7.4 9.5*  --   HGB 6.9* 11.4* 10.7* 10.5* 9.8* 10.5*  HCT 22.6* 35.6* 33.4* 34.5* 32.2* 34.6*  MCV 107.6* 96.7 99.4 104.2* 105.9* 103.3*  PLT 148* 107* 112* 102* 97* 90*   Basic Metabolic Panel: Recent Labs  Lab 06/20/18 0612 06/21/18 0605 06/22/18 0602 06/23/18 0619 06/24/18 0558  NA 147* 147* 149* 144 146*  K 3.6 3.3* 3.3* 4.1 4.2  CL 109 109 112* 108 111  CO2 31 31 30 29 28   GLUCOSE 217* 172* 90 176* 157*  BUN 33* 38* 39* 42* 45*  CREATININE 0.55 0.57 0.40* 0.60 0.62  CALCIUM 8.8* 8.6* 8.5* 8.3* 8.2*  MG  --  1.8  --   --  1.8   GFR: Estimated Creatinine Clearance: 40.8 mL/min (by C-G formula based on SCr of 0.62  mg/dL). Liver Function Tests: Recent Labs  Lab 06/24/18 0558  AST 44*  ALT 40  ALKPHOS 81  BILITOT 0.8  PROT 5.2*  ALBUMIN 1.9*   No results for input(s): LIPASE, AMYLASE in the last 168 hours. No results for input(s): AMMONIA in the last 168 hours. Coagulation Profile: No results for input(s): INR, PROTIME in the last 168 hours. Cardiac Enzymes: No results for input(s): CKTOTAL, CKMB, CKMBINDEX, TROPONINI in the last 168 hours. BNP (last 3 results) No results for input(s): PROBNP in the last 8760 hours. HbA1C: No results for input(s): HGBA1C in the last 72 hours. CBG: Recent Labs  Lab 06/24/18 1619 06/24/18 2016 06/25/18 0004 06/25/18 0422 06/25/18 0738  GLUCAP 125* 103* 122* 131* 111*   Lipid Profile: No results for input(s): CHOL, HDL, LDLCALC, TRIG, CHOLHDL, LDLDIRECT in the last 72 hours. Thyroid Function Tests: No results for input(s): TSH, T4TOTAL, FREET4, T3FREE, THYROIDAB in the last 72 hours. Anemia Panel: No results for input(s): VITAMINB12, FOLATE, FERRITIN, TIBC, IRON, RETICCTPCT in the last 72 hours. Sepsis Labs: Recent Labs  Lab 06/24/18 1644 06/25/18 0556  PROCALCITON 0.41 0.51    Recent Results (from the past 240 hour(s))  Urine Culture     Status: Abnormal   Collection Time: 06/20/18  9:44 AM  Result Value Ref Range Status   Specimen Description  Final    URINE, CLEAN CATCH Performed at [facility], [place] [address], [place], [place] [address]    Special Requests   Final    NONE Performed at [facility], [place] [address], [place], [place] [address]    Culture MULTIPLE SPECIES PRESENT, SUGGEST RECOLLECTION (A)  Final   Report Status 06/24/2018 FINAL  Final  Culture, blood (routine x 2)     Status: None (Preliminary result)   Collection Time: 06/20/18  2:54 PM  Result Value Ref Range Status   Specimen Description   Final    BLOOD LEFT HAND Performed at [facility] [address], [place], [place] [address]    Special Requests   Final    BOTTLES DRAWN AEROBIC ONLY Blood Culture adequate volume Performed at [facility] [address] [place], [place] [address]    Culture   Final    NO GROWTH 4 DAYS Performed at [facility], [place] [address], [place], [place] [address]    Report Status PENDING  Incomplete  Culture, blood (routine x 2)     Status: Abnormal   Collection Time: 06/20/18  2:54 PM  Result Value Ref Range Status   Specimen Description   Final    BLOOD RIGHT HAND Performed at [facility] [address] [place], [place] [address]    Special Requests   Final    BOTTLES DRAWN AEROBIC ONLY Blood Culture adequate volume Performed at [facility] [address], [place], [place] [address]    Culture  Setup Time   Final    GRAM POSITIVE COCCI AEROBIC BOTTLE ONLY CRITICAL RESULT CALLED TO, READ BACK BY AND VERIFIED WITH: L.PONDEXTER,PHARMD 2359 06/21/18 M.CAMPBELL    Culture (A)  Final    STAPHYLOCOCCUS SPECIES (COAGULASE NEGATIVE) THE SIGNIFICANCE OF ISOLATING THIS ORGANISM FROM A SINGLE SET OF BLOOD CULTURES WHEN MULTIPLE SETS ARE DRAWN IS UNCERTAIN. PLEASE NOTIFY THE MICROBIOLOGY DEPARTMENT WITHIN ONE WEEK IF SPECIATION AND SENSITIVITIES ARE REQUIRED. Performed at [facility], [place] [address], [place], [place] [address]    Report Status 06/23/2018 FINAL  Final  Blood Culture ID Panel (Reflexed)     Status: Abnormal   Collection Time: 06/20/18  2:54 PM  Result Value Ref Range Status   Enterococcus species NOT DETECTED NOT DETECTED Final   Listeria monocytogenes NOT DETECTED NOT DETECTED Final   Staphylococcus species DETECTED (A) NOT DETECTED Final    Comment: Methicillin (oxacillin) resistant coagulase negative staphylococcus. Possible blood culture contaminant (unless isolated from more than one blood culture draw or clinical case suggests pathogenicity). No antibiotic  treatment is indicated for blood  culture contaminants. CRITICAL RESULT CALLED TO, READ BACK BY AND VERIFIED WITH: L.PONDEXTER,PHARMD 2359 06/21/18 M.CAMPBELL    Staphylococcus aureus (BCID) NOT DETECTED NOT DETECTED Final   Methicillin resistance DETECTED (A) NOT DETECTED Final    Comment: CRITICAL RESULT CALLED TO, READ BACK BY AND VERIFIED WITH: L.PONDEXTER,PHARMD 2359 06/21/18 M.CAMPBELL    Streptococcus species NOT DETECTED NOT DETECTED Final   Streptococcus agalactiae NOT DETECTED NOT DETECTED Final   Streptococcus pneumoniae NOT DETECTED NOT DETECTED Final   Streptococcus pyogenes NOT DETECTED NOT DETECTED Final   Acinetobacter baumannii NOT DETECTED NOT DETECTED Final   Enterobacteriaceae species NOT DETECTED NOT DETECTED Final   Enterobacter cloacae complex NOT DETECTED NOT DETECTED Final   Escherichia coli NOT DETECTED NOT DETECTED Final   Klebsiella oxytoca NOT DETECTED NOT DETECTED  Final   Klebsiella pneumoniae NOT DETECTED NOT DETECTED Final   Proteus species NOT DETECTED NOT DETECTED Final   Serratia marcescens NOT DETECTED NOT DETECTED Final   Haemophilus influenzae NOT DETECTED NOT DETECTED Final   Neisseria meningitidis NOT DETECTED NOT DETECTED Final   Pseudomonas aeruginosa NOT DETECTED NOT DETECTED Final   Candida albicans NOT DETECTED NOT DETECTED Final   Candida glabrata NOT DETECTED NOT DETECTED Final   Candida krusei NOT DETECTED NOT DETECTED Final   Candida parapsilosis NOT DETECTED NOT DETECTED Final   Candida tropicalis NOT DETECTED NOT DETECTED Final    Comment: Performed at [facility], [place] [address], [place], [place] [address]  Culture, blood (routine x 2)     Status: None (Preliminary result)   Collection Time: 06/22/18  9:29 AM  Result Value Ref Range Status   Specimen Description   Final    BLOOD LEFT HAND Performed at [facility] [address], [place], [place] [address]    Special Requests   Final    BOTTLES DRAWN  AEROBIC AND ANAEROBIC Blood Culture adequate volume Performed at [facility] [address] [place], [place] [address]    Culture   Final    NO GROWTH 2 DAYS Performed at [facility] [address], [place], [place] [address]    Report Status PENDING  Incomplete  Culture, blood (routine x 2)     Status: None (Preliminary result)   Collection Time: 06/22/18  9:29 AM  Result Value Ref Range Status   Specimen Description   Final    BLOOD RIGHT ARM Performed at [facility] [address], [place], [place] [address]    Special Requests   Final    BOTTLES DRAWN AEROBIC ONLY Blood Culture results may not be optimal due to an inadequate volume of blood received in culture bottles Performed at [facility] [address] [place], [place] [address]    Culture   Final    NO GROWTH 2 DAYS Performed at [facility] [address], [place], [place] [address]    Report Status PENDING  Incomplete  MRSA PCR Screening     Status: Abnormal   Collection Time: 06/23/18  9:11 AM  Result Value Ref Range Status   MRSA by PCR POSITIVE (A) NEGATIVE Final    Comment:        The GeneXpert MRSA Assay (FDA approved for NASAL specimens only), is one component of a comprehensive MRSA colonization surveillance program. It is not intended to diagnose MRSA infection nor to guide or monitor treatment for MRSA infections. RESULT CALLED TO, READ BACK BY AND VERIFIED WITH: FRASER,B @ 1158 ON 121019 BY POTEAT,S Performed at [facility] [address], [place], [place] [address]   Culture, Urine     Status: Abnormal   Collection Time: 06/23/18  9:44 AM  Result Value Ref Range Status   Specimen Description   Final    URINE, CLEAN CATCH Performed at [facility], [place] [address] [place], [place] [address]    Special Requests   Final    NONE Performed at [facility], [place] [address], [place],  [address]    Culture MULTIPLE SPECIES PRESENT, SUGGEST RECOLLECTION (A)  Final   Report Status 06/25/2018 FINAL  Final         Radiology Studies: No results found.      Scheduled  Meds: . sodium chloride   Intravenous Once  . carvedilol  6.25 mg Oral BID  . ferrous sulfate  325 mg Oral BID WC  . folic acid  2 mg Oral Daily  . insulin aspart  0-9 Units Subcutaneous TID WC  . levETIRAcetam  500 mg Oral BID  . levothyroxine  100 mcg Oral Q0600  . magnesium oxide  400 mg Oral Daily  . pantoprazole  40 mg Oral Daily  . potassium chloride SA  20 mEq Oral Daily  . sertraline  50 mg Oral Daily   Continuous Infusions: . aztreonam    . dextrose 5 % and 0.45% NaCl 50 mL/hr at 06/25/18 0415  . [START ON 06/26/2018] vancomycin       LOS: 3 days    Time spent: [place], MD [facility]  If 7PM-7AM, please contact night-coverage [email or web address] Password [facility] 06/25/2018, 11:54 AM

## 2018-06-25 NOTE — Progress Notes (Signed)
Pharmacy Antibiotic Note  Jean Hodges is a 84 y.o. female admitted on 06/19/2018 with bacteremia.  Pharmacy has been consulted for vancomycin dosing. Pt also being started on aztreonam.  Attempted to contract MD about narrowing from aztreonam.   Plan:  Vancomycin 1000 mg IV x1, then 500 mg IV q24h   Aztreonam 2 gr IV x1, then 1 gr IV q8h   Monitor clinical course, renal function, cultures as available   Weight: 105 lb 2.6 oz (47.7 kg)  Temp (24hrs), Avg:98.7 F (37.1 C), Min:97.9 F (36.6 C), Max:99.6 F (37.6 C)  Recent Labs  Lab 06/20/18 0612 06/21/18 0605 06/22/18 0602 06/23/18 0619 06/24/18 0558  WBC 13.4* 10.0 8.9 10.3 12.4*  CREATININE 0.55 0.57 0.40* 0.60 0.62    Estimated Creatinine Clearance: 40.8 mL/min (by C-G formula based on SCr of 0.62 mg/dL).    Allergies  Allergen Reactions  . Acetaminophen Other (See Comments)    Unknown- patient can take APAP per Dr Purohit/ patient's son 06/24/18.  . Codeine Nausea Only  . Dilaudid [Hydromorphone] Nausea Only  . Hydrocodone Other (See Comments)    Hallucinations  . Keflex [Cephalexin] Diarrhea    Antimicrobials this admission: 12/10 ceftriaxone >> 12/12 12/12 vancomycin >>  12/12 aztreonam >>   Dose adjustments this admission:   Microbiology results: 12/7 BCx: CoNS 12/7 UCx: multiple species 12/9 BCx: NGTD 12/10 UCx: multiple species  12/10 MRSA PCR: positive  Thank you for allowing pharmacy to be a part of this patient's care.   Royetta Asal, PharmD, BCPS Pager [phone] 06/25/2018 11:44 AM

## 2018-06-26 DIAGNOSIS — Z7189 Other specified counseling: Secondary | ICD-10-CM

## 2018-06-26 LAB — COMPREHENSIVE METABOLIC PANEL
ALT: 50 U/L — ABNORMAL HIGH (ref 0–44)
AST: 49 U/L — ABNORMAL HIGH (ref 15–41)
Albumin: 1.7 g/dL — ABNORMAL LOW (ref 3.5–5.0)
Alkaline Phosphatase: 77 U/L (ref 38–126)
Anion gap: 9 (ref 5–15)
BUN: 49 mg/dL — ABNORMAL HIGH (ref 8–23)
CO2: 25 mmol/L (ref 22–32)
Calcium: 8.3 mg/dL — ABNORMAL LOW (ref 8.9–10.3)
Chloride: 112 mmol/L — ABNORMAL HIGH (ref 98–111)
Creatinine, Ser: 0.64 mg/dL (ref 0.44–1.00)
GFR calc Af Amer: >60 mL/min (ref >60)
GFR calc non Af Amer: >60 mL/min (ref >60)
Glucose, Bld: 174 mg/dL — ABNORMAL HIGH (ref 70–99)
Sodium: 146 mmol/L — ABNORMAL HIGH (ref 135–145)
Total Bilirubin: 0.7 mg/dL (ref 0.3–1.2)
Total Protein: 4.6 g/dL — ABNORMAL LOW (ref 6.5–8.1)

## 2018-06-26 LAB — CBC
HCT: 32.7 % — ABNORMAL LOW (ref 36.0–46.0)
Hemoglobin: 9.9 g/dL — ABNORMAL LOW (ref 12.0–15.0)
MCH: 32.6 pg (ref 26.0–34.0)
MCHC: 30.3 g/dL (ref 30.0–36.0)
MCV: 107.6 fL — ABNORMAL HIGH (ref 80.0–100.0)
Platelets: 69 10*3/uL — ABNORMAL LOW (ref 150–400)
RBC: 3.04 MIL/uL — ABNORMAL LOW (ref 3.87–5.11)
RDW: 18.6 % — ABNORMAL HIGH (ref 11.5–15.5)
WBC: 7.5 K/uL (ref 4.0–10.5)
nRBC: 0 % (ref 0.0–0.2)

## 2018-06-26 LAB — GLUCOSE, CAPILLARY
GLUCOSE-CAPILLARY: 133 mg/dL — AB (ref 70–99)
Glucose-Capillary: 150 mg/dL — ABNORMAL HIGH (ref 70–99)
Glucose-Capillary: 159 mg/dL — ABNORMAL HIGH (ref 70–99)
Glucose-Capillary: 170 mg/dL — ABNORMAL HIGH (ref 70–99)
Glucose-Capillary: 75 mg/dL (ref 70–99)
Glucose-Capillary: 86 mg/dL (ref 70–99)

## 2018-06-26 LAB — COMPREHENSIVE METABOLIC PANEL WITH GFR: Potassium: 3.3 mmol/L — ABNORMAL LOW (ref 3.5–5.1)

## 2018-06-26 LAB — PROCALCITONIN: Procalcitonin: 0.57 ng/mL

## 2018-06-26 NOTE — Progress Notes (Signed)
PROGRESS NOTE    Jean Hodges  HYW:[id number] DOB: [birth year] DOA: 06/19/2018 PCP: Patient, No Pcp Per    Brief Narrative:  84 year old with past medical history relevant for severe Alzheimer's dementia, protein calorie malnutrition/failure to thrive, hypothyroidism, Large sacral decubitus ulcers, hypothyroidism, seizure disorder, peripheral vascular disease, depression/anxiety, transfusion dependent myelodysplastic syndrome admitted with anemia with a hospital course complicated by fevers.   Assessment & Plan:   Active Problems:   Tonic-clonic seizure disorder (HCC)   Hypothyroidism   Type 2 diabetes mellitus with peripheral vascular disease (HCC)   PVD (peripheral vascular disease) ([place])   Alzheimer's dementia ([place])   Symptomatic anemia   Severe protein-calorie malnutrition (HCC)   Adult failure to thrive   Chronic systolic CHF (congestive heart failure) (HCC)   Sacral decubitus ulcer   Anemia   #) Fevers: Developed fevers to T-max of 103.  These resolved with initiation of antibiotics - Blood cultures from 06/20/2018 1 out of 2 growing coagulase-negative staph -Blood cultures from 06/22/2018 no growth to date -Repeat blood cultures on 06/25/2018 no growth to date (of note these were drawn after cefepime was given -Procalcitonin is elevated  -Continue IV cefepime started 06/24/2018  #) Acute on chronic systolic heart failure status post AICD: Chest x-ray on admission shows evidence of pulmonary edema.  Echo on 11/07/2017 shows EF of 20 to 25% with moderate to severe MR as well as evidence of pulmonary hypertension with moderate to severe tricuspid regurgitation -Holding diuresis in the setting of hypernatremia  #) Advanced Alzheimer's dementia: As the patient's mental status is somewhat better we will try to see if the patient can take p.o. -Palliative care consult -We will have speech line with pathology, evaluate the patient  #) Hypernatremia: Improving with gentle hydration  with hypotonic fluids -Hold furosemide  #) Presumed myelodysplastic syndrome complicated by recalcitrant anemia: Patient apparently is refused biopsy for this myelodysplastic syndrome though hospice was recommended by [facility]. 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: These appear to be the most relevant areas of pain for the patient -Wound care following, appreciate recommendations, they have signed off  #) Peripheral vascular disease/coronary artery disease status post CABG: - Continue carvedilol 6.25 mg twice daily  #) Seizure disorder: -Continue levetiracetam 500 mg twice daily  #) Hypothyroidism: -Continue levothyroxine 100 mcg  Fluids: Gentle IV fluids Electrolytes: Monitor and supplement Nutrition: Per above  Prophylaxis: SCDs  Disposition: Pending discussion about goals of care  Full code   Consultants:   None  Procedures:   None  Antimicrobials:  IV ceftriaxone started 06/23/2018   Subjective: This morning the patient is somewhat more awake but not alert.  She only moans in pain when handled.  She otherwise cannot provide any further symptoms. Objective: Vitals:   06/25/18 1912 06/25/18 2329 06/26/18 0443 06/26/18 0647  BP: (!) 110/52 (!) 117/44 (!) 139/58   Pulse: 93 91 93   Resp: 15 16 14    Temp: 97.7 F (36.5 C) 97.8 F (36.6 C) 97.8 F (36.6 C)   TempSrc: Oral Oral Oral   SpO2: 100% 99% 99%   Weight:    48.2 kg    Intake/Output Summary (Last 24 hours) at 06/26/2018 1026 Last data filed at 06/26/2018 0600 Gross per 24 hour  Intake 1447.H[place]ron  ml  Output 200 ml  Net 1247.11 ml   Filed Weights   06/23/18 0405 06/25/18 0500 06/26/18 0647  Weight: 45.9 kg 47.7 kg 48.2 kg     Examination:  General exam: No acute distress Respiratory system: Anterior lung sounds clear, scattered rhonchi Cardiovascular system: Irregularly irregular, no murmurs Gastrointestinal system: Soft, nondistended, no rebound or guarding. Central nervous system: Awake but not alert or oriented Extremities: 3+ lower extremity edema. Skin: Large decubitus ulcers, multiple large contusions over her friable skin Psychiatry: Unable to assess due to medical condition    Data Reviewed: I have personally reviewed following labs and imaging studies  CBC: Recent Labs  Lab 06/19/18 1444  06/21/18 0605 06/22/18 0602 06/23/18 0619 06/24/18 0558 06/25/18 2307 06/26/18 0644  WBC 16.2*   < > 10.0 8.9 10.3 12.4* 7.2 7.5  NEUTROABS 14.0*  --  8.6* 7.4 9.5*  --   --   --   HGB 6.9*   < > 10.7* 10.5* 9.8* 10.5* 9.7* 9.9*  HCT 22.6*   < > 33.4* 34.5* 32.2* 34.6* 32.4* 32.7*  MCV 107.6*   < > 99.4 104.2* 105.9* 103.3* 105.2* 107.6*  PLT 148*   < > 112* 102* 97* 90* 65* 69*   < > = values in this interval not displayed.   Basic Metabolic Panel: Recent Labs  Lab 06/21/18 0605 06/22/18 0602 06/23/18 0619 06/24/18 0558 06/25/18 2307 06/26/18 0644  NA 147* 149* 144 146* 145 146*  K 3.3* 3.3* 4.1 4.2 3.3* 3.3*  CL 109 112* 108 111 114* 112*  CO2 31 30 29 28 25 25   GLUCOSE 172* 90 176* 157* 167* 174*  BUN 38* 39* 42* 45* 47* 49*  CREATININE 0.57 0.40* 0.60 0.62 0.53 0.64  CALCIUM 8.6* 8.5* 8.3* 8.2* 8.0* 8.3*  MG 1.8  --   --  1.8  --   --    GFR: Estimated Creatinine Clearance: 41.3 mL/min (by C-G formula based on SCr of 0.64 mg/dL). Liver Function Tests: Recent Labs  Lab 06/24/18 0558 06/26/18 0644  AST 44* 49*  ALT 40 50*  ALKPHOS 81 77  BILITOT 0.8 0.7  PROT 5.2* 4.6*  ALBUMIN 1.9* 1.7*   No results for input(s): LIPASE, AMYLASE in the last 168 hours. No results for input(s): AMMONIA in the last 168 hours. Coagulation Profile: No results for input(s): INR, PROTIME in  the last 168 hours. Cardiac Enzymes: No results for input(s): CKTOTAL, CKMB, CKMBINDEX, TROPONINI in the last 168 hours. BNP (last 3 results) No results for input(s): PROBNP in the last 8760 hours. HbA1C: No results for input(s): HGBA1C in the last 72 hours. CBG: Recent Labs  Lab 06/25/18 1614 06/25/18 2027 06/26/18 0014 06/26/18 0505 06/26/18 0730  GLUCAP 140* 157* 150* 170* 159*   Lipid Profile: No results for input(s): CHOL, HDL, LDLCALC, TRIG, CHOLHDL, LDLDIRECT in the last 72 hours. Thyroid Function Tests: No results for input(s): TSH, T4TOTAL, FREET4, T3FREE, THYROIDAB in the last 72 hours. Anemia Panel: No results for input(s): VITAMINB12, FOLATE, FERRITIN, TIBC, IRON, RETICCTPCT in the last 72 hours. Sepsis Labs: Recent Labs  Lab 06/24/18 1644 06/25/18 0556 06/26/18 0644  PROCALCITON 0.41 0.51 0.57    Recent Results (from the past 240 hour(s))  Urine Culture     Status: Abnormal   Collection Time: 06/20/18  9:44 AM  Result Value Ref Range Status   Specimen Description   Final    URINE, CLEAN CATCH Performed at [facility], [place]  [address] [place], [place] [address]    Special Requests   Final    NONE Performed at [facility], [place] [address] [place], [place] [address]    Culture MULTIPLE SPECIES PRESENT, SUGGEST RECOLLECTION (A)  Final   Report Status 06/24/2018 FINAL  Final  Culture, blood (routine x 2)     Status: None   Collection Time: 06/20/18  2:54 PM  Result Value Ref Range Status   Specimen Description   Final    BLOOD LEFT HAND Performed at [facility] [place] [address], [place], [place] [address]    Special Requests   Final    BOTTLES DRAWN AEROBIC ONLY Blood Culture adequate volume Performed at [place] [address], [place], [place] [address]    Culture   Final    NO GROWTH 5 DAYS Performed at [facility], [place] [address], [place], [place]  [address]    Report Status 06/25/2018 FINAL  Final  Culture, blood (routine x 2)     Status: Abnormal   Collection Time: 06/20/18  2:54 PM  Result Value Ref Range Status   Specimen Description   Final    BLOOD RIGHT HAND Performed at [facility] [place] [address], [place], [place] [address]    Special Requests   Final    BOTTLES DRAWN AEROBIC ONLY Blood Culture adequate volume Performed at [place] [address], [place], [place] [address]    Culture  Setup Time   Final    GRAM POSITIVE COCCI AEROBIC BOTTLE ONLY CRITICAL RESULT CALLED TO, READ BACK BY AND VERIFIED WITH: L.PONDEXTER,PHARMD 2359 06/21/18 M.CAMPBELL    Culture (A)  Final    STAPHYLOCOCCUS SPECIES (COAGULASE NEGATIVE) THE SIGNIFICANCE OF ISOLATING THIS ORGANISM FROM A SINGLE SET OF BLOOD CULTURES WHEN MULTIPLE SETS ARE DRAWN IS UNCERTAIN. PLEASE NOTIFY THE MICROBIOLOGY DEPARTMENT WITHIN ONE WEEK IF SPECIATION AND SENSITIVITIES ARE REQUIRED. Performed at [facility], [place] [address], [place], [place] [address]    Report Status 06/23/2018 FINAL  Final  Blood Culture ID Panel (Reflexed)     Status: Abnormal   Collection Time: 06/20/18  2:54 PM  Result Value Ref Range Status   Enterococcus species NOT DETECTED NOT DETECTED Final   Listeria monocytogenes NOT DETECTED NOT DETECTED Final   Staphylococcus species DETECTED (A) NOT DETECTED Final    Comment: Methicillin (oxacillin) resistant coagulase negative staphylococcus. Possible blood culture contaminant (unless isolated from more than one blood culture draw or clinical case suggests pathogenicity). No antibiotic treatment is indicated for blood  culture contaminants. CRITICAL RESULT CALLED TO, READ BACK BY AND VERIFIED WITH: L.PONDEXTER,PHARMD 2359 06/21/18 M.CAMPBELL    Staphylococcus aureus (BCID) NOT DETECTED NOT DETECTED Final   Methicillin resistance DETECTED (A) NOT DETECTED Final    Comment: CRITICAL RESULT CALLED TO,  READ BACK BY AND VERIFIED WITH: L.PONDEXTER,PHARMD 2359 06/21/18 M.CAMPBELL    Streptococcus species NOT DETECTED NOT DETECTED Final   Streptococcus agalactiae NOT DETECTED NOT DETECTED Final   Streptococcus pneumoniae NOT DETECTED NOT DETECTED Final   Streptococcus pyogenes NOT DETECTED NOT DETECTED Final   Acinetobacter baumannii NOT DETECTED NOT DETECTED Final   Enterobacteriaceae species NOT DETECTED NOT DETECTED Final   Enterobacter cloacae complex NOT DETECTED NOT DETECTED Final   Escherichia coli NOT DETECTED NOT DETECTED Final   Klebsiella oxytoca NOT DETECTED NOT DETECTED Final   Klebsiella pneumoniae NOT DETECTED NOT DETECTED Final   Proteus species NOT DETECTED  NOT DETECTED Final   Serratia marcescens NOT DETECTED NOT DETECTED Final   Haemophilus influenzae NOT DETECTED NOT DETECTED Final   Neisseria meningitidis NOT DETECTED NOT DETECTED Final   Pseudomonas aeruginosa NOT DETECTED NOT DETECTED Final   Candida albicans NOT DETECTED NOT DETECTED Final   Candida glabrata NOT DETECTED NOT DETECTED Final   Candida krusei NOT DETECTED NOT DETECTED Final   Candida parapsilosis NOT DETECTED NOT DETECTED Final   Candida tropicalis NOT DETECTED NOT DETECTED Final    Comment: Performed at [facility], [place] [address], [place], [place] [address]  Culture, blood (routine x 2)     Status: None (Preliminary result)   Collection Time: 06/22/18  9:29 AM  Result Value Ref Range Status   Specimen Description   Final    BLOOD LEFT HAND Performed at [place] [address], [place], [place] [address]    Special Requests   Final    BOTTLES DRAWN AEROBIC AND ANAEROBIC Blood Culture adequate volume Performed at [place] [address] [place], [place] [address]    Culture   Final    NO GROWTH 4 DAYS Performed at [facility], [place] [address] [place], [place] [address]    Report Status PENDING  Incomplete  Culture, blood (routine  x 2)     Status: None (Preliminary result)   Collection Time: 06/22/18  9:29 AM  Result Value Ref Range Status   Specimen Description   Final    BLOOD RIGHT ARM Performed at [place] [address], [place], [place] [address]    Special Requests   Final    BOTTLES DRAWN AEROBIC ONLY Blood Culture results may not be optimal due to an inadequate volume of blood received in culture bottles Performed at [place] [address] [place], [place] [address]    Culture   Final    NO GROWTH 4 DAYS Performed at [facility], [place] [address], [place], [place] [address]    Report Status PENDING  Incomplete  MRSA PCR Screening     Status: Abnormal   Collection Time: 06/23/18  9:11 AM  Result Value Ref Range Status   MRSA by PCR POSITIVE (A) NEGATIVE Final    Comment:        The GeneXpert MRSA Assay (FDA approved for NASAL specimens only), is one component of a comprehensive MRSA colonization surveillance program. It is not intended to diagnose MRSA infection nor to guide or monitor treatment for MRSA infections. RESULT CALLED TO, READ BACK BY AND VERIFIED WITH: FRASER,B @ 1158 ON 121019 BY POTEAT,S Performed at [facility] [address], [place], [place] [address]   Culture, Urine     Status: Abnormal   Collection Time: 06/23/18  9:44 AM  Result Value Ref Range Status   Specimen Description   Final    URINE, CLEAN CATCH Performed at [facility], [place] [address] [place] [address] [place], [place] [address]    Special Requests   Final    NONE Performed at [facility], [place] [address], [place], [place] [address]    Culture MULTIPLE SPECIES PRESENT, SUGGEST RECOLLECTION (A)  Final   Report Status 06/25/2018 FINAL  Final  Culture, blood (routine x 2)     Status: None (Preliminary result)   Collection Time: 06/25/18 11:07 PM  Result Value Ref Range Status   Specimen Description   Final  BLOOD RIGHT HAND Performed at [facility], [place] [address] [place], [place] [address]    Special Requests   Final    AEROBIC BOTTLE ONLY Blood Culture results may not be optimal due to an inadequate volume of blood received in culture bottles Performed at [place] [address] [place], [place] [address]    Culture PENDING  Incomplete   Report Status PENDING  Incomplete  Culture, blood (routine x 2)     Status: None (Preliminary result)   Collection Time: 06/25/18 11:07 PM  Result Value Ref Range Status   Specimen Description   Final    BLOOD LEFT HAND Performed at [facility], [place] [address], [place], [place] [address]    Special Requests   Final    AEROBIC BOTTLE ONLY Blood Culture results may not be optimal due to an inadequate volume of blood received in culture bottles Performed at [place] [address], [place], [place] [address]    Culture PENDING  Incomplete   Report Status PENDING  Incomplete         Radiology Studies: No results found.      Scheduled Meds: . sodium chloride   Intravenous Once  . carvedilol  6.25 mg Oral BID  . Chlorhexidine Gluconate Cloth  6 each Topical Q0600  . ferrous sulfate  325 mg Oral BID WC  . folic acid  2 mg Oral Daily  . insulin aspart  0-9 Units Subcutaneous TID WC  . levETIRAcetam  500 mg Oral BID  . levothyroxine  100 mcg Oral Q0600  . magnesium oxide  400 mg Oral Daily  . mupirocin ointment  1 application Nasal BID  . pantoprazole  40 mg Oral Daily  . potassium chloride SA  20 mEq Oral Daily  . sertraline  50 mg Oral Daily   Continuous Infusions: . ceFEPime (MAXIPIME) IV 1 g (06/26/18 0918)  . vancomycin       LOS: 4 days    Time spent: [place], MD [facility]  If 7PM-7AM, please contact night-coverage [email or web address] Password [email or web address] 06/26/2018, 10:26 AM

## 2018-06-26 NOTE — Consult Note (Signed)
Consultation Note Date: 06/26/2018   Patient Name: Jean Hodges  DOB: January 06, 1934  MRN: [id number]  Age / Sex: 84 y.o., female  PCP: Patient, No Pcp Per Referring Physician: Cristy Folks, MD  Reason for Consultation: Establishing goals of care  HPI/Patient Profile: 84 y.o. female  admitted on 06/19/2018   84 year old with past medical history relevant for severe Alzheimer's dementia, protein calorie malnutrition/failure to thrive, hypothyroidism, Large sacral decubitus ulcers, hypothyroidism, seizure disorder, peripheral vascular disease, depression/anxiety, transfusion dependent myelodysplastic syndrome admitted with anemia with a hospital course complicated by fevers, one blood culture also positive for coagulase-negative staph.   Clinical Assessment and Goals of Care:  Patient remains admitted to hospital medicine service. She has acute on chronic systolic heart failure status post AICD: Chest x-ray on admission shows evidence of pulmonary edema.  Echo on 11/07/2017 shows EF of 20 to 25% with moderate to severe MR as well as evidence of pulmonary hypertension with moderate to severe tricuspid regurgitation.  Patient's life limiting illness also includes history of advanced alzheimer's dementia.  SLP eval to see if she can start PO diet.   The patient lives in a SNF and she has palliative care following her over there.   The patient is non verbal, she doesn't engage. Appears very frail and weak.   We received a call back from son Jean Hodges. I called him and we talked briefly over the phone about the patient's current hospitalization and also about her underlying conditions. I discussed with him the need for addressing her code status and overall goals of care. He would like for his sister to also participate, but I was not able to reach her by phone.   We have decided to meet tomorrow at noon, in Ms  Jean Hodges's room for further discussions. The remains with a declining quality of life, a mode of care that focuses on comfort measures and hospice support seems to be most prudent at this stage. Further recommendations to follow the outcome of family meeting.   NEXT OF KIN  son Jean Hodges [phone] Patient also has a daughter, her name is Jean Hodges, her number is [phone], I called her too, to invite her to the family meeting, unable to get in touch with her, mailbox is full and I cannot leave a message.   SUMMARY OF RECOMMENDATIONS    family meeting for code status and goals of care discussions, scheduled for 06-27-18 at [facility], as per the patient's son's request and preference. Full note and recommendations to follow the outcome of those discussions.  Thank you for the consult.   Code Status/Advance Care Planning:  Full code    Symptom Management:    as above   Palliative Prophylaxis:   Delirium Protocol   Psycho-social/Spiritual:   Desire for further Chaplaincy support:yes  Additional Recommendations: Caregiving  Support/Resources  Prognosis:   Guarded   Discharge Planning: To Be Determined      Primary Diagnoses: Present on Admission: . Anemia . Tonic-clonic seizure disorder (Jean Hodges) .  Hypothyroidism . Type 2 diabetes mellitus with peripheral vascular disease (Jean Hodges) . PVD (peripheral vascular disease) (Jean Hodges) . Alzheimer's dementia (Jean Hodges) . Symptomatic anemia . Adult failure to thrive . Chronic systolic CHF (congestive heart failure) (Jean Hodges) . Severe protein-calorie malnutrition ([place]) . Sacral decubitus ulcer   I have reviewed the medical record, interviewed the patient and family, and examined the patient. The following aspects are pertinent.  Past Medical History:  Diagnosis Date  . Acute encephalopathy   . Acute encephalopathy 09/30/2016  . Alzheimer's dementia without behavioral disturbance (Jean Hodges)   . Alzheimer's dementia without behavioral disturbance  (Jean Hodges) 10/07/2016  . Anemia   . Anemia 01/28/2014  . Atherosclerotic PVD with ulceration (Jean Hodges)   . CAD (coronary artery disease)   . CAD (coronary artery disease) 01/04/2014  . Cellulitis   . Cellulitis of left leg   . CHF (congestive heart failure) (Jean Hodges)   . Diabetes mellitus without complication (Jean Hodges)   . DM (diabetes mellitus) (Jean Hodges) 01/27/2014  . Dysphagia   . HTN (hypertension) 01/04/2014  . Hyperlipidemia   . Hypertension   . Hypoglycemia associated with type 2 diabetes mellitus (Jean Hodges)   . Macrocytic anemia   . Seizures (Jean Hodges) 09/30/2016  . Tear of left rotator cuff   . UTI (urinary tract infection)   . UTI (urinary tract infection) 09/25/2016   Social History   Socioeconomic History  . Marital status: Widowed    Spouse name: Not on file  . Number of children: Not on file  . Years of education: Not on file  . Highest education level: Not on file  Occupational History  . Not on file  Social Needs  . Financial resource strain: Not on file  . Food insecurity:    Worry: Not on file    Inability: Not on file  . Transportation needs:    Medical: Not on file    Non-medical: Not on file  Tobacco Use  . Smoking status: Never Smoker  . Smokeless tobacco: Never Used  Substance and Sexual Activity  . Alcohol use: No  . Drug use: No  . Sexual activity: Not on file  Lifestyle  . Physical activity:    Days per week: Not on file    Minutes per session: Not on file  . Stress: Not on file  Relationships  . Social connections:    Talks on phone: Not on file    Gets together: Not on file    Attends religious service: Not on file    Active member of club or organization: Not on file    Attends meetings of clubs or organizations: Not on file    Relationship status: Not on file  Other Topics Concern  . Not on file  Social History Narrative   Lives at [address]   Phone: [phone]   Caffeine use:    ** Merged History Encounter **       Family  History  Family history unknown: Yes   Scheduled Meds: . sodium chloride   Intravenous Once  . carvedilol  6.25 mg Oral BID  . Chlorhexidine Gluconate Cloth  6 each Topical Q0600  . ferrous sulfate  325 mg Oral BID WC  . folic acid  2 mg Oral Daily  . insulin aspart  0-9 Units Subcutaneous TID WC  . levETIRAcetam  500 mg Oral BID  . levothyroxine  100 mcg Oral Q0600  . magnesium oxide  400 mg Oral Daily  . mupirocin  ointment  1 application Nasal BID  . pantoprazole  40 mg Oral Daily  . potassium chloride SA  20 mEq Oral Daily  . sertraline  50 mg Oral Daily   Continuous Infusions: . ceFEPime (MAXIPIME) IV 1 g (06/26/18 0918)   PRN Meds:.acetaminophen, hydrALAZINE, loperamide, ondansetron **OR** ondansetron (ZOFRAN) IV, oxyCODONE Medications Prior to Admission:  Prior to Admission medications   Medication Sig Start Date End Date Taking? Authorizing Provider  carvedilol (COREG) 6.25 MG tablet Take 6.25 mg by mouth 2 (two) times daily with a meal.   Yes [provider]  ferrous sulfate 325 (65 FE) MG tablet Take 325 mg by mouth 2 (two) times daily with a meal.   Yes [provider]  folic acid (FOLVITE) 1 MG tablet Take 2 mg by mouth daily.   Yes [provider]  levETIRAcetam (KEPPRA) 500 MG tablet Take 500 mg by mouth 2 (two) times daily.   Yes [provider]  levothyroxine (SYNTHROID, LEVOTHROID) 100 MCG tablet Take 100 mcg by mouth daily before breakfast.   Yes [provider]  loperamide (IMODIUM) 2 MG capsule Take 2 capsules (4 mg total) by mouth as needed for diarrhea or loose stools. 04/05/18  Yes Osei-Bonsu, Iona Beard, MD  magnesium oxide (MAG-OX) 400 MG tablet Take 400 mg by mouth daily.   Yes [provider]  Menthol, Topical Analgesic, (BIOFREEZE) 4 % GEL Apply 1 application topically 2 (two) times daily as needed (pain).   Yes [provider]  Multiple Vitamins-Minerals (DECUBI-VITE PO) Take 1 capsule by mouth  daily.   Yes [provider]  ondansetron (ZOFRAN) 4 MG tablet Take 4 mg by mouth every 6 (six) hours as needed for nausea or vomiting.   Yes [provider]  oxyCODONE (OXY IR/ROXICODONE) 5 MG immediate release tablet Take 0.5 tablets (2.5 mg total) by mouth every 8 (eight) hours as needed for moderate pain or severe pain. 05/15/18  Yes Debbe Odea, MD  pantoprazole (PROTONIX) 40 MG tablet Take 1 tablet (40 mg total) by mouth daily. 07/25/17  Yes Barton Dubois, MD  potassium chloride SA (K-DUR,KLOR-CON) 20 MEQ tablet Take 20 mEq by mouth daily.   Yes [provider]  sertraline (ZOLOFT) 50 MG tablet Take 50 mg by mouth daily.   Yes [provider]  feeding supplement, ENSURE ENLIVE, (ENSURE ENLIVE) LIQD Take 237 mLs by mouth 4 (four) times daily. Patient not taking: Reported on 06/19/2018 05/15/18   Debbe Odea, MD   Allergies  Allergen Reactions  . Acetaminophen Other (See Comments)    Unknown- patient can take APAP per Dr Purohit/ patient's son 06/24/18.  . Codeine Nausea Only  . Dilaudid [Hydromorphone] Nausea Only  . Hydrocodone Other (See Comments)    Hallucinations  . Keflex [Cephalexin] Diarrhea   Review of Systems Doesn't verbalize to me  Physical Exam Frail cachectic lady Scattered rhonchi Irregular She has decub ulcers S 1 S 2 Abdomen is not distended Senile purpuric spots on both UE  Vital Signs: BP (!) 148/51   Pulse 78   Temp 97.8 F (36.6 C) (Oral)   Resp 18   Wt 48.2 kg   SpO2 100%   BMI 18.82 kg/m  Pain Scale: PAINAD POSS *See Group Information*: S-Acceptable,Sleep, easy to arouse Pain Score: Asleep   SpO2: SpO2: 100 % O2 Device:SpO2: 100 % O2 Flow Rate: .   IO: Intake/output summary:   Intake/Output Summary (Last 24 hours) at 06/26/2018 1455 Last data filed at 06/26/2018 0600  Gross per 24 hour  Intake 1447.11 ml  Output 200 ml  Net 1247.11 ml    LBM: Last BM Date: 06/25/18 Baseline Weight: Weight: 46.9  kg Most recent weight: Weight: 48.2 kg     Palliative Assessment/Data:   Flowsheet Rows     Most Recent Value  Intake Tab  Referral Department  Hospitalist  Unit at Time of Referral  Med/Surg Unit  Palliative Care Primary Diagnosis  Other (Comment) [advanced end-stage Alzheimer dementia]  Date Notified  06/20/18  Palliative Care Type  Return patient Palliative Care  Reason for referral  Clarify Goals of Care  Date of Admission  06/19/18  # of days IP prior to Palliative referral  1  Clinical Assessment  Psychosocial & Spiritual Assessment  Palliative Care Outcomes    PPS 30%  Time In:  1300 Time Out:  1400 Time Total:  60 min  Greater than 50%  of this time was spent counseling and coordinating care related to the above assessment and plan.  Signed by: Loistine Chance, MD  [phone] Please contact Palliative Medicine Team phone at [phone] for questions and concerns.  For individual provider: See Shea Evans

## 2018-06-26 NOTE — Progress Notes (Signed)
  Speech Language Pathology Treatment: Dysphagia  Patient Details Name: Jean Hodges MRN: [id number] DOB: 06-10-1934 Today's Date: 06/26/2018 Time: 9407-6808 SLP Time Calculation (min) (ACUTE ONLY): 30 min  Assessment / Plan / Recommendation Clinical Impression  Pt today is much more alert - order received for reevaluation to determine readiness for po intake.  Pt would not open mouth adequately to assess tongue, palate - she did present with secretions upon opening to accept po intake.  She did not follow directions today but was fully alert.    Pt accepted intake of sweet tea, Ensure, ice cream and single bolus of mashed potatoes.  Oral organization is very poor with thicker consistencies and pt allows stasis without transiting attempts.   She readily swallowed tea and consumed approximately 2.5 ounces without difficulties.  Thinner liquids will be more efficient for pt to consume.    However once pt was given Ensure she did now swallow nor expectorate over a period of 25 minutes despite SLP attempts to clear with toothette, spoon, straw, cues to spit with cup/emesis bag.  Pt nodded yes to holding Ensure and no to desire to swallow but would not expectorate.  SLP phoned secretary to request pt receive oral suction unit as she has a pure wick in place.  Secretary called portable and requested one - per secretary a unit was provided to pt after SLP.  Requested to secretary to inform RN of need to suction pt.   This pt with dementia will NOT MEET nutritional needs but given her dementia - functional goal is for her to consume po of liquids with precautions.  Do not recommend alternative means as research does not indicate changes in outcomes for those with dementia, thus careful hand feeding recommended.    Will follow up Monday 12/16 for education with family to level of dysphagia that is cognitive based.  Thanks.   Please assure pt has ORAL SUCTION available at all times during po to decrease  aspiration risk.    HPI HPI: 84 yo female adm to [facility] with anemia.  PMH + for severe malutrition, dementia, myelodysplastic syndrome, RA, prior hospice.  CXR pulmonary edema.  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  Swallow evaluation ordered.  RN reports pt is aspirating secretions and states pt has an unstagable wound.       SLP Plan  Continue with current plan of care       Recommendations  Diet recommendations: Dysphagia 1 (puree);Other(comment);Thin liquid(full liquids) Liquids provided via: Cup;Straw Medication Administration: Other (Comment)(with icecream, crushed) Supervision: Full supervision/cueing for compensatory strategies Compensations: Slow rate;Small sips/bites;Other (Comment);Minimize environmental distractions;Lingual sweep for clearance of pocketing(oral suction if pt does not swallow) Postural Changes and/or Swallow Maneuvers: Seated upright 90 degrees;Upright 30-60 min after meal                Oral Care Recommendations: Oral care QID SLP Visit Diagnosis: Dysphagia, oropharyngeal phase (R13.12) Plan: Continue with current plan of care       GO                [name] 06/26/2018, 3:25 PM   Luanna Salk, [place] [facility] SLP Acute Rehab Services Pager [phone] Office [phone]

## 2018-06-27 ENCOUNTER — Telehealth: Payer: Self-pay | Admitting: Internal Medicine

## 2018-06-27 LAB — COMPREHENSIVE METABOLIC PANEL
ALT: 62 U/L — ABNORMAL HIGH (ref 0–44)
AST: 71 U/L — ABNORMAL HIGH (ref 15–41)
Albumin: 1.5 g/dL — ABNORMAL LOW (ref 3.5–5.0)
Alkaline Phosphatase: 76 U/L (ref 38–126)
Anion gap: 7 (ref 5–15)
BUN: 50 mg/dL — ABNORMAL HIGH (ref 8–23)
CHLORIDE: 116 mmol/L — AB (ref 98–111)
CO2: 25 mmol/L (ref 22–32)
Calcium: 8.3 mg/dL — ABNORMAL LOW (ref 8.9–10.3)
Creatinine, Ser: 0.7 mg/dL (ref 0.44–1.00)
GFR calc Af Amer: >60 mL/min (ref >60)
GFR calc non Af Amer: >60 mL/min (ref >60)
Glucose, Bld: 85 mg/dL (ref 70–99)
Potassium: 3.6 mmol/L (ref 3.5–5.1)
SODIUM: 148 mmol/L — AB (ref 135–145)
Total Bilirubin: 0.7 mg/dL (ref 0.3–1.2)
Total Protein: 4.2 g/dL — ABNORMAL LOW (ref 6.5–8.1)

## 2018-06-27 LAB — CBC
HCT: 31.3 % — ABNORMAL LOW (ref 36.0–46.0)
Hemoglobin: 9.7 g/dL — ABNORMAL LOW (ref 12.0–15.0)
MCH: 31.2 pg (ref 26.0–34.0)
MCHC: 31 g/dL (ref 30.0–36.0)
MCV: 100.6 fL — ABNORMAL HIGH (ref 80.0–100.0)
Platelets: 46 10*3/uL — ABNORMAL LOW (ref 150–400)
RBC: 3.11 MIL/uL — ABNORMAL LOW (ref 3.87–5.11)
RDW: 18.3 % — ABNORMAL HIGH (ref 11.5–15.5)
WBC: 6.4 10*3/uL (ref 4.0–10.5)
nRBC: 0 % (ref 0.0–0.2)

## 2018-06-27 LAB — GLUCOSE, CAPILLARY
Glucose-Capillary: 82 mg/dL (ref 70–99)
Glucose-Capillary: 74 mg/dL (ref 70–99)
Glucose-Capillary: 74 mg/dL (ref 70–99)
Glucose-Capillary: 74 mg/dL (ref 70–99)

## 2018-06-27 LAB — CULTURE, BLOOD (ROUTINE X 2)
CULTURE: NO GROWTH
Culture: NO GROWTH
Special Requests: ADEQUATE

## 2018-06-27 MED ORDER — FLEET ENEMA 7-19 GM/118ML RE ENEM: 1.0000 | ENEMA | Freq: Once | RECTAL | Status: DC

## 2018-06-27 NOTE — Progress Notes (Signed)
BP  87/33 manual. On call physician notified. Awaiting orders

## 2018-06-27 NOTE — Telephone Encounter (Signed)
Repeat call to son, [name], to inform him that the inpatient Palliative Care team has been attempting to contact him and to discuss his mother's current health status with recommendations for comfort care without any additional blood transfusions.  He asked me to explain the difference between Palliative and Hospice Care and that description was provided.  When asked what were his feelings about his mother's current status, he replied that he was "continuing to pray for healing and for her to be a witness and that's all he knows".  He did not commit to any change of ACP decisions during this conversation.  He did inform me that he and his sister [name] will be meeting with one of the physicians on 06/27/18 at 12noon at [place] to discuss further plans for Jean Hodges.  I encouraged him to have an open mind and to do what is right for the patient to prevent suffering.  He thanked me for our discussion.

## 2018-06-27 NOTE — Progress Notes (Signed)
PROGRESS NOTE    Jean Hodges  BHA:[id number] DOB: April 10, 1934 DOA: 06/19/2018 PCP: Patient, No Pcp Per    Brief Narrative:  84 year old with past medical history relevant for severe Alzheimer's dementia, protein calorie malnutrition/failure to thrive, hypothyroidism, Large sacral decubitus ulcers, hypothyroidism, seizure disorder, peripheral vascular disease, depression/anxiety, transfusion dependent myelodysplastic syndrome admitted with anemia with a hospital course complicated by fevers.   Assessment & Plan:   Active Problems:   Tonic-clonic seizure disorder (HCC)   Hypothyroidism   Type 2 diabetes mellitus with peripheral vascular disease (HCC)   PVD (peripheral vascular disease) ([place])   Alzheimer's dementia ([place])   Symptomatic anemia   Severe protein-calorie malnutrition (HCC)   Adult failure to thrive   Chronic systolic CHF (congestive heart failure) (HCC)   Sacral decubitus ulcer   Anemia   Goals of care, counseling/discussion   #) Fevers: Developed fevers to T-max of 103.  These resolved with initiation of antibiotics but no clear microbiological source. - Blood cultures from 06/20/2018 1 out of 2 growing coagulase-negative staph -Blood cultures from 06/22/2018 no growth to date -Repeat blood cultures on 06/25/2018 no growth to date (of note these were drawn after cefepime was given -Procalcitonin is elevated  -Continue IV cefepime started 06/24/2018  #) Advanced Alzheimer's dementia: Patient does seem to be awake but her mental status continues to be poor.  She cannot take anything by mouth for speech-language pathology.  This is been a longstanding issue with the patient and discuss with the family about goals of care has been difficult.  There is a plan for family meeting today to discuss goals of care as she is not taking anything by mouth and is not a candidate for more advanced enteral needs. -Palliative care consult -Speech-language pathology recommended hand feeding  by mouth with understanding that likely goals of care discussion should be had as patient will not improve due to her dementia.  #) Acute on chronic systolic heart failure status post AICD: Chest x-ray on admission shows evidence of pulmonary edema.  Echo on 11/07/2017 shows EF of 20 to 25% with moderate to severe MR as well as evidence of pulmonary hypertension with moderate to severe tricuspid regurgitation -Holding diuresis in the setting of hypernatremia  #) Hypernatremia: Improving with gentle hydration with hypotonic fluids -Hold furosemide  #) Presumed myelodysplastic syndrome complicated by recalcitrant anemia: Patient apparently is refused biopsy for this myelodysplastic syndrome though hospice was recommended by [facility]. 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: These appear to be the most relevant areas of pain for the patient -Wound care following, appreciate recommendations, they have signed off  #) Peripheral vascular disease/coronary artery disease status post CABG: - Continue carvedilol 6.25 mg twice daily  #) Seizure disorder: -Continue levetiracetam 500 mg twice daily  #) Hypothyroidism: -Continue levothyroxine 100 mcg  Fluids: Gentle IV fluids Electrolytes: Monitor and supplement Nutrition: Per above  Prophylaxis: SCDs  Disposition: Pending discussion about goals of care  Full code   Consultants:   None  Procedures:   None  Antimicrobials:  IV ceftriaxone 06/23/2018 06/24/2018  IV cefepime started 06/24/2018 to ongoing   Subjective: This morning the patient is quite somnolent.  She only moans diffusely when moved.  She otherwise has had an uneventful night. Objective: Vitals:  06/27/18  0409 06/27/18 0415 06/27/18 0657 06/27/18 0831  BP: (!) 93/36 (!) 87/36 (!) 101/42 (!) 111/49  Pulse: 81  79 78  Resp: 12  14 14   Temp: 97.6 F (36.4 C)  (!) 97.4 F (36.3 C)   TempSrc: Oral  Axillary   SpO2: 99%  96% 100%  Weight:   47.2 kg     Intake/Output Summary (Last 24 hours) at 06/27/2018 1022 Last data filed at 06/27/2018 0700 Gross per 24 hour  Intake 200 ml  Output 200 ml  Net 0 ml   Filed Weights   06/25/18 0500 06/26/18 0647 06/27/18 0657  Weight: 47.7 kg 48.2 kg 47.2 kg    Examination:  General exam: No acute distress Respiratory system: Anterior lung sounds clear, scattered rhonchi Cardiovascular system: Irregularly irregular, no murmurs Gastrointestinal system: Soft, nondistended, no rebound or guarding. Central nervous system: Awake but not alert or oriented Extremities: 3+ lower extremity edema. Skin: Large decubitus ulcers, multiple large contusions over her friable skin Psychiatry: Unable to assess due to medical condition    Data Reviewed: I have personally reviewed following labs and imaging studies  CBC: Recent Labs  Lab 06/21/18 0605 06/22/18 0602 06/23/18 0619 06/24/18 0558 06/25/18 2307 06/26/18 0644  WBC 10.0 8.9 10.3 12.4* 7.2 7.5  NEUTROABS 8.6* 7.4 9.5*  --   --   --   HGB 10.7* 10.5* 9.8* 10.5* 9.7* 9.9*  HCT 33.4* 34.5* 32.2* 34.6* 32.4* 32.7*  MCV 99.4 104.2* 105.9* 103.3* 105.2* 107.6*  PLT 112* 102* 97* 90* 65* 69*   Basic Metabolic Panel: Recent Labs  Lab 06/21/18 0605 06/22/18 0602 06/23/18 0619 06/24/18 0558 06/25/18 2307 06/26/18 0644  NA 147* 149* 144 146* 145 146*  K 3.3* 3.3* 4.1 4.2 3.3* 3.3*  CL 109 112* 108 111 114* 112*  CO2 31 30 29 28 25 25   GLUCOSE 172* 90 176* 157* 167* 174*  BUN 38* 39* 42* 45* 47* 49*  CREATININE 0.57 0.40* 0.60 0.62 0.53 0.64  CALCIUM 8.6* 8.5* 8.3* 8.2* 8.0* 8.3*  MG 1.8  --   --  1.8  --   --    GFR: Estimated Creatinine Clearance: 40.4 mL/min (by C-G formula based on SCr  of 0.64 mg/dL). Liver Function Tests: Recent Labs  Lab 06/24/18 0558 06/26/18 0644  AST 44* 49*  ALT 40 50*  ALKPHOS 81 77  BILITOT 0.8 0.7  PROT 5.2* 4.6*  ALBUMIN 1.9* 1.7*   No results for input(s): LIPASE, AMYLASE in the last 168 hours. No results for input(s): AMMONIA in the last 168 hours. Coagulation Profile: No results for input(s): INR, PROTIME in the last 168 hours. Cardiac Enzymes: No results for input(s): CKTOTAL, CKMB, CKMBINDEX, TROPONINI in the last 168 hours. BNP (last 3 results) No results for input(s): PROBNP in the last 8760 hours. HbA1C: No results for input(s): HGBA1C in the last 72 hours. CBG: Recent Labs  Lab 06/26/18 0730 06/26/18 1151 06/26/18 1721 06/26/18 2030 06/27/18 0751  GLUCAP 159* 133* 86 75 82   Lipid Profile: No results for input(s): CHOL, HDL, LDLCALC, TRIG, CHOLHDL, LDLDIRECT in the last 72 hours. Thyroid Function Tests: No results for input(s): TSH, T4TOTAL, FREET4, T3FREE, THYROIDAB in the last 72 hours. Anemia Panel: No results for input(s): VITAMINB12, FOLATE, FERRITIN, TIBC, IRON, RETICCTPCT in the last 72 hours. Sepsis Labs: Recent Labs  Lab 06/24/18 1644 06/25/18 0556 06/26/18 0644  PROCALCITON 0.41 0.51 0.57    Recent Results (from the past 240 hour(s))  Urine Culture     Status: Abnormal   Collection Time: 06/20/18  9:44 AM  Result Value Ref Range Status   Specimen Description   Final    URINE, CLEAN CATCH Performed at [facility], [place] [address], [place], [place] [address]    Special Requests   Final    NONE Performed at [facility], [place] [address] [place], [place] [address]    Culture MULTIPLE SPECIES PRESENT, SUGGEST RECOLLECTION (A)  Final   Report Status 06/24/2018 FINAL  Final  Culture, blood (routine x 2)     Status: None   Collection Time: 06/20/18  2:54 PM  Result Value Ref Range Status   Specimen Description   Final    BLOOD LEFT HAND Performed at [place] [address], [place], [place] [address]    Special Requests   Final    BOTTLES DRAWN AEROBIC ONLY Blood Culture adequate volume Performed at [place] [address], [place], [place] [address]    Culture   Final    NO GROWTH 5 DAYS Performed at [facility], [place] [address], [place], [place] [address]    Report Status 06/25/2018 FINAL  Final  Culture, blood (routine x 2)     Status: Abnormal   Collection Time: 06/20/18  2:54 PM  Result Value Ref Range Status   Specimen Description   Final    BLOOD RIGHT HAND Performed at [place] [address], [place], [place] [address]    Special Requests   Final    BOTTLES DRAWN AEROBIC ONLY Blood Culture adequate volume Performed at [place] [address] [place], [place] [address]    Culture  Setup Time   Final    GRAM POSITIVE COCCI AEROBIC BOTTLE ONLY CRITICAL RESULT CALLED TO, READ BACK BY AND VERIFIED WITH: L.PONDEXTER,PHARMD 2359 06/21/18 M.CAMPBELL    Culture (A)  Final    STAPHYLOCOCCUS SPECIES (COAGULASE NEGATIVE) THE SIGNIFICANCE OF ISOLATING THIS ORGANISM FROM A SINGLE SET OF BLOOD CULTURES WHEN MULTIPLE SETS ARE DRAWN IS UNCERTAIN. PLEASE NOTIFY THE MICROBIOLOGY DEPARTMENT WITHIN ONE WEEK IF SPECIATION AND SENSITIVITIES ARE REQUIRED. Performed at [facility], [place] [address], [place],  [address]    Report Status 06/23/2018 FINAL  Final  Blood Culture ID Panel (Reflexed)     Status: Abnormal   Collection Time: 06/20/18  2:54 PM  Result Value Ref Range Status   Enterococcus species NOT DETECTED NOT DETECTED Final   Listeria monocytogenes NOT DETECTED NOT DETECTED Final   Staphylococcus species DETECTED (A) NOT DETECTED Final    Comment: Methicillin (oxacillin) resistant coagulase negative staphylococcus. Possible blood culture contaminant (unless isolated from more than one blood culture draw or clinical case  suggests pathogenicity). No antibiotic treatment is indicated for blood  culture contaminants. CRITICAL RESULT CALLED TO, READ BACK BY AND VERIFIED WITH: L.PONDEXTER,PHARMD 2359 06/21/18 M.CAMPBELL    Staphylococcus aureus (BCID) NOT DETECTED NOT DETECTED Final   Methicillin resistance DETECTED (A) NOT DETECTED Final    Comment: CRITICAL RESULT CALLED TO, READ BACK BY AND VERIFIED WITH: L.PONDEXTER,PHARMD 2359 06/21/18 M.CAMPBELL    Streptococcus species NOT DETECTED NOT DETECTED Final   Streptococcus agalactiae NOT DETECTED NOT DETECTED Final   Streptococcus pneumoniae NOT DETECTED NOT DETECTED Final   Streptococcus pyogenes NOT DETECTED NOT DETECTED Final   Acinetobacter baumannii NOT DETECTED NOT DETECTED Final   Enterobacteriaceae species NOT DETECTED NOT DETECTED Final  Enterobacter cloacae complex NOT DETECTED NOT DETECTED Final   Escherichia coli NOT DETECTED NOT DETECTED Final   Klebsiella oxytoca NOT DETECTED NOT DETECTED Final   Klebsiella pneumoniae NOT DETECTED NOT DETECTED Final   Proteus species NOT DETECTED NOT DETECTED Final   Serratia marcescens NOT DETECTED NOT DETECTED Final   Haemophilus influenzae NOT DETECTED NOT DETECTED Final   Neisseria meningitidis NOT DETECTED NOT DETECTED Final   Pseudomonas aeruginosa NOT DETECTED NOT DETECTED Final   Candida albicans NOT DETECTED NOT DETECTED Final   Candida glabrata NOT DETECTED NOT DETECTED Final   Candida krusei NOT DETECTED NOT DETECTED Final   Candida parapsilosis NOT DETECTED NOT DETECTED Final   Candida tropicalis NOT DETECTED NOT DETECTED Final    Comment: Performed at [facility], [place] [address] [place], [place] [address]  Culture, blood (routine x 2)     Status: None (Preliminary result)   Collection Time: 06/22/18  9:29 AM  Result Value Ref Range Status   Specimen Description   Final    BLOOD LEFT HAND Performed at [place] [address] [place], [place] [address]     Special Requests   Final    BOTTLES DRAWN AEROBIC AND ANAEROBIC Blood Culture adequate volume Performed at [place] [address], [place], [place] [address]    Culture   Final    NO GROWTH 4 DAYS Performed at [facility], [place] [address], [place], [place] [address]    Report Status PENDING  Incomplete  Culture, blood (routine x 2)     Status: None (Preliminary result)   Collection Time: 06/22/18  9:29 AM  Result Value Ref Range Status   Specimen Description   Final    BLOOD RIGHT ARM Performed at [place] [address] [place], [place] [address]    Special Requests   Final    BOTTLES DRAWN AEROBIC ONLY Blood Culture results may not be optimal due to an inadequate volume of blood received in culture bottles Performed at [place] [address], [place], [place] [address]    Culture   Final    NO GROWTH 4 DAYS Performed at [facility], [place] [address] [place], [place] [address]    Report Status PENDING  Incomplete  MRSA PCR Screening     Status: Abnormal   Collection Time: 06/23/18  9:11 AM  Result Value Ref Range Status   MRSA by PCR POSITIVE (A) NEGATIVE Final    Comment:        The GeneXpert MRSA Assay (FDA approved for NASAL specimens only), is one component of a comprehensive MRSA colonization surveillance program. It is not intended to diagnose MRSA infection nor to guide or monitor treatment for MRSA infections. RESULT CALLED TO, READ BACK BY AND VERIFIED WITH: FRASER,B @ 1158 ON 121019 BY POTEAT,S Performed at [place] [address] [place], [place] [address]   Culture, Urine     Status: Abnormal   Collection Time: 06/23/18  9:44 AM  Result Value Ref Range Status   Specimen Description   Final    URINE, CLEAN CATCH Performed at [facility], [place] [address], [place], [place] [address]    Special Requests   Final     NONE Performed at [facility], [place] [address], [place], [place] [address]    Culture MULTIPLE SPECIES PRESENT, SUGGEST RECOLLECTION (A)  Final  Report Status 06/25/2018 FINAL  Final  Culture, blood (routine x 2)     Status: None (Preliminary result)   Collection Time: 06/25/18 11:07 PM  Result Value Ref Range Status   Specimen Description   Final    BLOOD RIGHT HAND Performed at [facility], [place] [address], [place], [place] [address]    Special Requests   Final    AEROBIC BOTTLE ONLY Blood Culture results may not be optimal due to an inadequate volume of blood received in culture bottles Performed at [place] [address], [place], [place] [address]    Culture PENDING  Incomplete   Report Status PENDING  Incomplete  Culture, blood (routine x 2)     Status: None (Preliminary result)   Collection Time: 06/25/18 11:07 PM  Result Value Ref Range Status   Specimen Description   Final    BLOOD LEFT HAND Performed at [facility], [place] [address] [place], [place] [address]    Special Requests   Final    AEROBIC BOTTLE ONLY Blood Culture results may not be optimal due to an inadequate volume of blood received in culture bottles Performed at [place] [address] [place], [place] [address]    Culture PENDING  Incomplete   Report Status PENDING  Incomplete         Radiology Studies: No results found.      Scheduled Meds: . sodium chloride   Intravenous Once  . carvedilol  6.25 mg Oral BID  . Chlorhexidine Gluconate Cloth  6 each Topical Q0600  . ferrous sulfate  325 mg Oral BID WC  . folic acid  2 mg Oral Daily  . insulin aspart  0-9 Units Subcutaneous TID WC  . levETIRAcetam  500 mg Oral BID  . levothyroxine  100 mcg Oral Q0600  . magnesium oxide  400 mg Oral Daily  . mupirocin ointment  1 application Nasal BID  . pantoprazole  40 mg Oral Daily  . potassium chloride SA  20 mEq Oral  Daily  . sertraline  50 mg Oral Daily  . sodium phosphate  1 enema Rectal Once   Continuous Infusions: . ceFEPime (MAXIPIME) IV Stopped (06/26/18 2349)     LOS: 5 days    Time spent: [place], MD [facility]  If 7PM-7AM, please contact night-coverage [email or web address] Password [email or web address] 06/27/2018, 10:22 AM

## 2018-06-27 NOTE — Progress Notes (Signed)
Daily Progress Note   Patient Name: Jean Hodges       Date: 06/27/2018 DOB: 19-Feb-1934  Age: 84 y.o. MRN#: [id number] Attending Physician: Cristy Folks, MD Primary Care Physician: Patient, No Pcp Per Admit Date: 06/19/2018  Reason for Consultation/Follow-up: Establishing goals of care  Subjective:  remains frail weak Non verbal PO intake is minimal to nil Family meeting held See below  Length of Stay: 5  Current Medications: Scheduled Meds:  . sodium chloride   Intravenous Once  . carvedilol  6.25 mg Oral BID  . Chlorhexidine Gluconate Cloth  6 each Topical Q0600  . ferrous sulfate  325 mg Oral BID WC  . folic acid  2 mg Oral Daily  . insulin aspart  0-9 Units Subcutaneous TID WC  . levETIRAcetam  500 mg Oral BID  . levothyroxine  100 mcg Oral Q0600  . magnesium oxide  400 mg Oral Daily  . mupirocin ointment  1 application Nasal BID  . pantoprazole  40 mg Oral Daily  . potassium chloride SA  20 mEq Oral Daily  . sertraline  50 mg Oral Daily  . sodium phosphate  1 enema Rectal Once    Continuous Infusions: . ceFEPime (MAXIPIME) IV 1 g (06/27/18 1211)    PRN Meds: acetaminophen, hydrALAZINE, loperamide, ondansetron **OR** ondansetron (ZOFRAN) IV, oxyCODONE  Physical Exam         Frail weak lady Scattered rhonchi irreg irreg S 1 S 2  Abdomen is soft Has edema Has large decub ulcers Has contusions  Vital Signs: BP 117/75 (BP Location: Left Arm)   Pulse 79   Temp (!) 97.2 F (36.2 C) (Axillary)   Resp 16   Wt 47.2 kg   SpO2 100%   BMI 18.43 kg/m  SpO2: SpO2: 100 % O2 Device: O2 Device: Room Air O2 Flow Rate:    Intake/output summary:   Intake/Output Summary (Last 24 hours) at 06/27/2018 1627 Last data filed at 06/27/2018 0700 Gross per 24 hour    Intake 200 ml  Output 200 ml  Net 0 ml   LBM: Last BM Date: 06/27/18 Baseline Weight: Weight: 46.9 kg Most recent weight: Weight: 47.2 kg       Palliative Assessment/Data: PPS 20%   Flowsheet Rows     Most Recent Value  Intake Tab  Referral Department  Hospitalist  Unit at Time of Referral  Med/Surg Unit  Palliative Care Primary Diagnosis  Other (Comment) [advanced end-stage Alzheimer dementia]  Date Notified  06/20/18  Palliative Care Type  Return patient Palliative Care  Reason for referral  Clarify Goals of Care  Date of Admission  06/19/18  # of days IP prior to Palliative referral  1  Clinical Assessment  Psychosocial & Spiritual Assessment  Palliative Care Outcomes      Patient Active Problem List   Diagnosis Date Noted  . Goals of care, counseling/discussion   . Anemia 06/19/2018  . Decubitus ulcer of coccygeal region, unstageable ([place]) 05/15/2018  . Pressure ulcer of BKA stump ([place]) 05/15/2018  . Refractory anemia due to myelodysplastic syndrome ([place]) 05/14/2018  . Type 2 diabetes mellitus ([place]) 05/14/2018  . GERD (gastroesophageal reflux disease) 05/14/2018  . Chronic pain 05/14/2018  . Hx of BKA, right ([place]) 04/04/2018  . Chronic systolic CHF (congestive heart failure) ([place]) 02/20/2018  . Sacral decubitus ulcer 02/20/2018  . Counseling regarding advanced care planning and goals of care 01/17/2018  . Large stool   . Abdominal pain 01/16/2018  . Seizure disorder ([place]) 12/29/2017  . Memory loss 12/02/2017  . Bruising, spontaneous 12/01/2017  . Adult failure to thrive   . Palliative care by specialist   . Palliative care encounter   . Altered mental status   . Pressure injury of skin 11/04/2017  . Hyperkalemia 11/04/2017  . UTI (urinary tract infection) 11/03/2017  . Severe protein-calorie malnutrition ([place]) 10/10/2017  . Macrocytic anemia 07/23/2017  . Symptomatic anemia 11/27/2016  . Tonic-clonic seizure disorder ([place]) 10/09/2016  . Dysphagia  10/09/2016  . Moderate protein-calorie malnutrition ([place]) 10/09/2016  . Hypothyroidism 10/09/2016  . Type 2 diabetes mellitus with peripheral vascular disease ([place]) 10/09/2016  . PVD (peripheral vascular disease) ([place]) 10/09/2016  . HTN (hypertension) 10/09/2016  . Alzheimer's dementia ([place]) 10/09/2016  . Depression 10/09/2016  . Hyperlipidemia 10/09/2016  . Coronary artery disease involving native coronary artery of native heart without angina pectoris 10/09/2016    Palliative Care Assessment & Plan   Patient Profile:    Assessment: Advanced Alzheimer's dementia Acute on chronic systolic heart failure status post AICD Presumed myelodysplastic syndrome complicated by recalcitrant anemia Sacral unstageable ulcer, right BKA stump with stage II ulcer.  Patient has underlying history of seizure disorder, history of peripheral vascular disease, history of coronary artery disease status post CABG, history of ejection fraction 20-5 5% with moderate to severe mitral regurgitation, evidence of pulmonary hypertension, moderate to severe tricuspid regurgitation. Oral intake minimal-nil Functional decline, bed bound status  Recommendations/Plan: Family meeting: I met with the patient's son and daughter and other family members who arrived at the bedside for palliative care meeting, goals of care and CODE STATUS discussions meeting. I introduced myself and palliative care as follows: Palliative medicine is specialized medical care for people living with serious illness. It focuses on providing relief from the symptoms and stress of a serious illness. The goal is to improve quality of life for both the patient and the family.  Brief life review performed. Patient is originally from [place], [place]. In 2017 patient had a seizure and cerebrovascular accident. She had required a stay at an intensive care unit. She was also briefly on the ventilator at that time. Over the course of the past  2 years, the patient has had ongoing functional decline, ongoing cognitive decline, she has lost a lot of weight.  More recently,  the patient has had recurrent hospitalizations.  We discussed about the patient's serious acute as well as underlying conditions most all of which are irreversible in nature and progressive in nature. Patient has advanced Alzheimer's dementia, she has a serum albumin of 1.7 g/dL. She has an ICD she has valvular heart disease she has low ejection fraction. She is frail and weak and bed bound and not eating. She is on appropriate antibiotics. Lab work is not able to be procured, she has low temperatures and low blood pressures. She does not appear to be showing any signs of meaningful recovery in spite of the most appropriate medical means at this time.  Goals wishes and values attempted to be discussed. CODE STATUS discussions undertaken in detail. Artificial nutrition and hydration discussed in detail.  Patient's son was stating that at the nursing facility, the patient was not felt to diligently. He was recalling all the various discussions regarding PEG tube that have occurred in the recent past. Patient also saw GI and consideration was also given for interventional radiology to place the PEG tube.  We discussed about disadvantages and side effects of the patient undergoing anesthesia for the procedure, the disadvantages and complications if the patient were to pull out her PEG tube, the surgical risk of placing such a tube, and frankly also discussed about there being no improvement in meaningful quality of life in the setting of advanced dementia and artificial measures such as feeding tube.  Additionally, also discussed about DO NOT RESUSCITATE/DO NOT INTUBATE. Discussed about patient's high risk for aspiration, high risk for developing sepsis or respiratory failure. Offered frankly but compassionately medical recommendation for DO NOT INTUBATE. We discussed in detail  about the patient's defibrillator. Difference between pacemaker and defibrillator discussed in detail. Discussed that to consider deactivation of defibrillator so as to avoid pain and suffering at end-of-life.  Mode of care that will focus exclusively on comfort measures, the type of care that can be provided inside a residential hospice facility discussed in detail with the patient's son and daughter are present at the bedside. The patient's daughter became tearful and stated that most of the times she did not even want to go see the patient at the nursing facility because the patient has had no quality of life.  The patient's son stated that he felt like hitting something every time conversations of this nature were brought up. Acknowledged that these are difficult but important to have conversations. Offered him my presence active listening and supportive care. I listened as he offered in detail about the patient's overall health condition over the course of the past 2 years.  Patient's son was asking about IV fluids at the hospice facility. Stated that the overall scope of care that is provided inside a hospice setting is for compassionate care at end-of-life. Most of the times, artificial hydration causes more harm than benefit especially when there is third spacing involved, could worsen pleural effusions, could cause more discomfort or more shortness of breath. Instead, I advised him to consider careful hand-assisted feeding of just a few sips and bites whenever the patient was most awake and most alert. He will continue to think about this.  Patient's son would like to go ahead and establish contact with hospice liaison. I will request clinical social worker to help facilitate. He understands that the defibrillator will need to be deactivated when the patient transitions to comfort measures and symptom management inside a residential hospice facility.  All of the patient's  son and family's  questions answered to the best of my ability. The patient's sister lives in the same nursing facility and the family is concerned about how the patient's sister will deal with the patient not coming back to her nursing home. It is reported that the patient's sister  has Parkinson's and requires a lot of assistance as well.    Code Status:    Code Status Orders  (From admission, onward)         Start     Ordered   06/27/18 1600  Do not attempt resuscitation (DNR)  Continuous    Question Answer Comment  In the event of cardiac or respiratory ARREST Do not call a "code blue"   In the event of cardiac or respiratory ARREST Do not perform Intubation, CPR, defibrillation or ACLS   In the event of cardiac or respiratory ARREST Use medication by any route, position, wound care, and other measures to relive pain and suffering. May use oxygen, suction and manual treatment of airway obstruction as needed for comfort.      06/27/18 1600        Code Status History    Date Active Date Inactive Code Status Order ID Comments User Context   06/19/2018 2055 06/27/2018 1600 Full Code [id number]  Elmarie Shiley, MD Inpatient   05/14/2018 2018 05/15/2018 2349 Full Code [id number]  Shela Leff, MD ED   04/04/2018 0212 04/06/2018 1753 Full Code [id number]  Gwynne Edinger, MD Inpatient   02/20/2018 2123 02/21/2018 1905 Full Code [id number]  Etta Quill, DO ED   01/16/2018 2309 01/19/2018 1619 Full Code [id number]  Rise Patience, MD Inpatient   12/29/2017 2110 12/30/2017 2059 Full Code [id number]  Rise Patience, MD ED   11/03/2017 1648 11/14/2017 1942 Full Code [id number]  Kayleen Memos, DO ED   10/09/2017 2031 10/10/2017 1831 Full Code [id number]  Norval Morton, MD ED   07/23/2017 1509 07/24/2017 1809 Full Code [id number]  Phillips Grout, MD Inpatient   11/27/2016 2203 11/28/2016 2154 Full Code [id number]  Rise Patience, MD Inpatient    Advance Directive Documentation     Most Recent Value    Type of Advance Directive  Out of facility DNR (pink MOST or yellow form)  Pre-existing out of facility DNR order (yellow form or pink MOST form)  Pink MOST form placed in chart (order not valid for inpatient use)  "MOST" Form in Place?  -       Prognosis:   < 2 weeks  Discharge Planning:  Hospice facility  Care plan was discussed with  Patient's son, daughter, other family members.  Also discussed with bedside RN and Dr Herbert Moors [facility] MD.   Thank you for allowing the Palliative Medicine Team to assist in the care of this patient.   Time In: 1300 Time Out: 1430 Total Time 90 Prolonged Time Billed  yes       Greater than 50%  of this time was spent counseling and coordinating care related to the above assessment and plan.  Loistine Chance, MD [phone] Please contact Palliative Medicine Team phone at [phone] for questions and concerns.

## 2018-06-27 NOTE — Progress Notes (Signed)
Notified to hospitality via [employer]

## 2018-06-27 NOTE — Progress Notes (Signed)
Patient rectal temp 96.0 warm blanket applied and Dr. Herbert Moors  notified

## 2018-06-28 LAB — GLUCOSE, CAPILLARY
Glucose-Capillary: 75 mg/dL (ref 70–99)
Glucose-Capillary: 71 mg/dL (ref 70–99)

## 2018-06-28 MED ORDER — DEXTROSE-NACL 5-0.45 % IV SOLN
INTRAVENOUS | Status: DC
  Administered 2018-06-28 – 2018-06-29 (×2): via INTRAVENOUS

## 2018-06-28 MED ORDER — DEXTROSE 50 % IV SOLN
25.0000 mL | Freq: Once | INTRAVENOUS | Status: AC
  Administered 2018-06-28: 25 mL via INTRAVENOUS
  Filled 2018-06-28: qty 50

## 2018-06-28 MED ORDER — MORPHINE SULFATE (PF) 2 MG/ML IV SOLN
1.0000 mg | INTRAVENOUS | Status: DC | PRN
  Administered 2018-06-28 – 2018-06-29 (×2): 1 mg via INTRAVENOUS
  Filled 2018-06-28 (×2): qty 1

## 2018-06-28 MED ORDER — MORPHINE SULFATE (CONCENTRATE) 10 MG/0.5ML PO SOLN
5.0000 mg | ORAL | Status: DC | PRN
  Administered 2018-06-28 – 2018-06-29 (×3): 5 mg via SUBLINGUAL
  Filled 2018-06-28 (×3): qty 0.5

## 2018-06-28 NOTE — Progress Notes (Signed)
Daily Progress Note   Patient Name: Jean Hodges       Date: 06/28/2018 DOB: Jun 07, 1934  Age: 84 y.o. MRN#: [id number] Attending Physician: Cristy Folks, MD Primary Care Physician: Patient, No Pcp Per Admit Date: 06/19/2018  Reason for Consultation/Follow-up: Establishing goals of care  Subjective:  remains frail weak Non verbal PO intake is minimal to nil Family meeting held on 12-14 Unsafe with PO medications now, high risk of aspiration.  See below  Length of Stay: 6  Current Medications: Scheduled Meds:  . sodium chloride   Intravenous Once  . carvedilol  6.25 mg Oral BID  . Chlorhexidine Gluconate Cloth  6 each Topical Q0600  . ferrous sulfate  325 mg Oral BID WC  . folic acid  2 mg Oral Daily  . insulin aspart  0-9 Units Subcutaneous TID WC  . levETIRAcetam  500 mg Oral BID  . levothyroxine  100 mcg Oral Q0600  . magnesium oxide  400 mg Oral Daily  . mupirocin ointment  1 application Nasal BID  . pantoprazole  40 mg Oral Daily  . potassium chloride SA  20 mEq Oral Daily  . sertraline  50 mg Oral Daily  . sodium phosphate  1 enema Rectal Once    Continuous Infusions: . ceFEPime (MAXIPIME) IV 1 g (06/28/18 1038)    PRN Meds: acetaminophen, hydrALAZINE, loperamide, morphine injection, morphine CONCENTRATE, ondansetron **OR** ondansetron (ZOFRAN) IV, oxyCODONE  Physical Exam         Frail weak lady Scattered rhonchi irreg irreg S 1 S 2  Abdomen is soft Has edema Has large decub ulcers Has contusions  Vital Signs: BP (!) 83/62   Pulse 79   Temp (!) 97 F (36.1 C) (Axillary)   Resp 16   Wt 46.3 kg   SpO2 98%   BMI 18.08 kg/m  SpO2: SpO2: 98 % O2 Device: O2 Device: Room Air O2 Flow Rate:    Intake/output summary:   Intake/Output Summary (Last 24  hours) at 06/28/2018 1226 Last data filed at 06/27/2018 1900 Gross per 24 hour  Intake 100 ml  Output -  Net 100 ml   LBM: Last BM Date: 06/27/18 Baseline Weight: Weight: 46.9 kg Most recent weight: Weight: 46.3 kg       Palliative Assessment/Data: PPS 20%   Flowsheet Rows  Most Recent Value  Intake Tab  Referral Department  Hospitalist  Unit at Time of Referral  Med/Surg Unit  Palliative Care Primary Diagnosis  Other (Comment) [advanced end-stage Alzheimer dementia]  Date Notified  06/20/18  Palliative Care Type  Return patient Palliative Care  Reason for referral  Clarify Goals of Care  Date of Admission  06/19/18  # of days IP prior to Palliative referral  1  Clinical Assessment  Psychosocial & Spiritual Assessment  Palliative Care Outcomes      Patient Active Problem List   Diagnosis Date Noted  . Goals of care, counseling/discussion   . Anemia 06/19/2018  . Decubitus ulcer of coccygeal region, unstageable ([place]) 05/15/2018  . Pressure ulcer of BKA stump ([place]) 05/15/2018  . Refractory anemia due to myelodysplastic syndrome ([place]) 05/14/2018  . Type 2 diabetes mellitus ([place]) 05/14/2018  . GERD (gastroesophageal reflux disease) 05/14/2018  . Chronic pain 05/14/2018  . Hx of BKA, right ([place]) 04/04/2018  . Chronic systolic CHF (congestive heart failure) ([place]) 02/20/2018  . Sacral decubitus ulcer 02/20/2018  . Counseling regarding advanced care planning and goals of care 01/17/2018  . Large stool   . Abdominal pain 01/16/2018  . Seizure disorder ([place]) 12/29/2017  . Memory loss 12/02/2017  . Bruising, spontaneous 12/01/2017  . Adult failure to thrive   . Palliative care by specialist   . Palliative care encounter   . Altered mental status   . Pressure injury of skin 11/04/2017  . Hyperkalemia 11/04/2017  . UTI (urinary tract infection) 11/03/2017  . Severe protein-calorie malnutrition ([place]) 10/10/2017  . Macrocytic anemia 07/23/2017  . Symptomatic anemia  11/27/2016  . Tonic-clonic seizure disorder ([place]) 10/09/2016  . Dysphagia 10/09/2016  . Moderate protein-calorie malnutrition ([place]) 10/09/2016  . Hypothyroidism 10/09/2016  . Type 2 diabetes mellitus with peripheral vascular disease ([place]) 10/09/2016  . PVD (peripheral vascular disease) ([place]) 10/09/2016  . HTN (hypertension) 10/09/2016  . Alzheimer's dementia ([place]) 10/09/2016  . Depression 10/09/2016  . Hyperlipidemia 10/09/2016  . Coronary artery disease involving native coronary artery of native heart without angina pectoris 10/09/2016    Palliative Care Assessment & Plan   Patient Profile:    Assessment: Advanced Alzheimer's dementia Acute on chronic systolic heart failure status post AICD Presumed myelodysplastic syndrome complicated by recalcitrant anemia Sacral unstageable ulcer, right BKA stump with stage II ulcer.  Patient has underlying history of seizure disorder, history of peripheral vascular disease, history of coronary artery disease status post CABG, history of ejection fraction 20-5 5% with moderate to severe mitral regurgitation, evidence of pulmonary hypertension, moderate to severe tricuspid regurgitation. Oral intake minimal-nil Functional decline, bed bound status  Recommendations/Plan: Agree with PO Morphine, also add low dose IV Morphine PRN Appreciate [facility] consult Recommend de activation of ICD, discussed with [facility] MD.  Continue current supportive measures Prognosis appears to be few days to less than 2 weeks, at this point.      Code Status:    Code Status Orders  (From admission, onward)         Start     Ordered   06/27/18 1600  Do not attempt resuscitation (DNR)  Continuous    Question Answer Comment  In the event of cardiac or respiratory ARREST Do not call a "code blue"   In the event of cardiac or respiratory ARREST Do not perform Intubation, CPR, defibrillation or ACLS   In the event of cardiac or respiratory ARREST Use medication by any  route, position, wound care, and  other measures to relive pain and suffering. May use oxygen, suction and manual treatment of airway obstruction as needed for comfort.      06/27/18 1600        Code Status History    Date Active Date Inactive Code Status Order ID Comments User Context   06/19/2018 2055 06/27/2018 1600 Full Code [id number]  Elmarie Shiley, MD Inpatient   05/14/2018 2018 05/15/2018 2349 Full Code [id number]  Shela Leff, MD ED   04/04/2018 0212 04/06/2018 1753 Full Code [id number]  Gwynne Edinger, MD Inpatient   02/20/2018 2123 02/21/2018 1905 Full Code [id number]  Etta Quill, DO ED   01/16/2018 2309 01/19/2018 1619 Full Code [id number]  Rise Patience, MD Inpatient   12/29/2017 2110 12/30/2017 2059 Full Code [id number]  Rise Patience, MD ED   11/03/2017 1648 11/14/2017 1942 Full Code [id number]  Kayleen Memos, DO ED   10/09/2017 2031 10/10/2017 1831 Full Code [id number]  Norval Morton, MD ED   07/23/2017 1509 07/24/2017 1809 Full Code [id number]  Phillips Grout, MD Inpatient   11/27/2016 2203 11/28/2016 2154 Full Code [id number]  Rise Patience, MD Inpatient    Advance Directive Documentation     Most Recent Value  Type of Advance Directive  Out of facility DNR (pink MOST or yellow form)  Pre-existing out of facility DNR order (yellow form or pink MOST form)  Pink MOST form placed in chart (order not valid for inpatient use)  "MOST" Form in Place?  -       Prognosis:   < 2 weeks  Discharge Planning:  Hospice facility  Care plan was discussed with  [facility], RN, and Dr Herbert Moors [facility] MD.   Thank you for allowing the Palliative Medicine Team to assist in the care of this patient.   Time In: 12 Time Out: 12.25 Total Time 25 Prolonged Time Billed no      Greater than 50%  of this time was spent counseling and coordinating care related to the above assessment and plan.   Loistine Chance, MD [phone] Please contact Palliative Medicine Team phone at [phone]  for questions and concerns.

## 2018-06-28 NOTE — Progress Notes (Signed)
PROGRESS NOTE    Jean Hodges  XBM:[id number] DOB: 05/05/1934 DOA: 06/19/2018 PCP: Patient, No Pcp Per    Brief Narrative:  84 year old with past medical history relevant for severe Alzheimer's dementia, protein calorie malnutrition/failure to thrive, hypothyroidism, Large sacral decubitus ulcers, hypothyroidism, seizure disorder, peripheral vascular disease, depression/anxiety, transfusion dependent myelodysplastic syndrome admitted with anemia with a hospital course complicated by fevers.   Assessment & Plan:   Active Problems:   Tonic-clonic seizure disorder (HCC)   Hypothyroidism   Type 2 diabetes mellitus with peripheral vascular disease (HCC)   PVD (peripheral vascular disease) ([place])   Alzheimer's dementia ([place])   Symptomatic anemia   Severe protein-calorie malnutrition (HCC)   Adult failure to thrive   Chronic systolic CHF (congestive heart failure) (HCC)   Sacral decubitus ulcer   Anemia   Goals of care, counseling/discussion  #) Advanced Alzheimer's dementia: Patient is more awake.  Her p.o. intake continues to be poor.  After extensive discussion with palliative care and the family they have decided to transition the patient to DNR and pursue hospice.  The son is still quite hung up on the idea that she will get IV fluids at hospice.  We will further discuss with the son about how this is not feasible.  We will let the patient eat and drink as she tolerates.  We will continue IV antibiotics while she is here however we will stop checking labs as we can no longer obtain blood from her.  She did require a femoral vein stick to get the last set of labs and this is no longer feasible. -Palliative care consult -Speech-language pathology recommended hand feeding by mouth with understanding that likely goals of care discussion should be had as patient will not improve due to her dementia. -Discontinue daily labs   #) Fevers: Developed fevers to T-max of 103.  These resolved with  initiation of antibiotics but no clear microbiological source.  Procalcitonin was elevated. - Blood cultures from 06/20/2018 1 out of 2 growing coagulase-negative staph -Blood cultures from 06/22/2018 no growth to date -Repeat blood cultures on 06/25/2018 no growth to date (of note these were drawn after cefepime was given -Continue IV cefepime started 06/24/2018, plan to discontinue once patient is discharged to hospice.   #) Acute on chronic systolic heart failure status post AICD: Chest x-ray on admission shows evidence of pulmonary edema.  Echo on 11/07/2017 shows EF of 20 to 25% with moderate to severe MR as well as evidence of pulmonary hypertension with moderate to severe tricuspid regurgitation -Holding diuresis in the setting of hypernatremia -EP consulted to have [facility] ICD/by V pacer shut off (only ICD portion we shot off at the request of the son)  #) Hypernatremia: We will stop checking daily labs -Hold furosemide  #) Presumed myelodysplastic syndrome complicated by recalcitrant anemia: Patient apparently is refused biopsy for this myelodysplastic syndrome though hospice was recommended by [facility]. 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 ad lib. Electrolytes: Monitor and supplement Nutrition: Per  above  Prophylaxis: SCDs  Disposition: Pending discharge to hospice  DNR   Consultants:   Palliative care  EP  Procedures:   None  Antimicrobials:  IV ceftriaxone 06/23/2018 06/24/2018  IV cefepime started 06/24/2018 to ongoing   Subjective: This morning the patient is awake.  She is smiling when smiled at but otherwise does not respond to any stimulus.  She otherwise cannot have labs drawn today as phlebotomy cannot find any veins to stick. Objective: Vitals:   06/27/18 2214 06/28/18 0400 06/28/18 0600 06/28/18 1045  BP: (!) 70/24 (!) 77/36  (!) 83/62  Pulse: 79 77  79  Resp:  14  16  Temp:  (!) 97 F (36.1 C)    TempSrc:  Axillary    SpO2:  100%  98%  Weight:   46.3 kg     Intake/Output Summary (Last 24 hours) at 06/28/2018 1138 Last data filed at 06/27/2018 1900 Gross per 24 hour  Intake 100 ml  Output -  Net 100 ml   Filed Weights   06/26/18 0647 06/27/18 0657 06/28/18 0600  Weight: 48.2 kg 47.2 kg 46.3 kg    Examination:  General exam: No acute distress Respiratory system: Anterior lung sounds clear, scattered rhonchi Cardiovascular system: Irregularly irregular, no murmurs Gastrointestinal system: Soft, nondistended, no rebound or guarding. Central nervous system: Awake but not alert or oriented Extremities: 3+ lower extremity edema. Skin: Large decubitus ulcers, multiple large contusions over her friable skin Psychiatry: Unable to assess due to medical condition    Data Reviewed: I have personally reviewed following labs and imaging studies  CBC: Recent Labs  Lab 06/22/18 0602 06/23/18 0619 06/24/18 0558 06/25/18 2307 06/26/18 0644 06/27/18 1145  WBC 8.9 10.3 12.4* 7.2 7.5 6.4  NEUTROABS 7.4 9.5*  --   --   --   --   HGB 10.5* 9.8* 10.5* 9.7* 9.9* 9.7*  HCT 34.5* 32.2* 34.6* 32.4* 32.7* 31.3*  MCV 104.2* 105.9* 103.3* 105.2* 107.6* 100.6*  PLT 102* 97* 90* 65* 69* 46*   Basic Metabolic Panel: Recent Labs  Lab 06/23/18 0619  06/24/18 0558 06/25/18 2307 06/26/18 0644 06/27/18 1145  NA 144 146* 145 146* 148*  K 4.1 4.2 3.3* 3.3* 3.6  CL 108 111 114* 112* 116*  CO2 29 28 25 25 25   GLUCOSE 176* 157* 167* 174* 85  BUN 42* 45* 47* 49* 50*  CREATININE 0.60 0.62 0.53 0.64 0.70  CALCIUM 8.3* 8.2* 8.0* 8.3* 8.3*  MG  --  1.8  --   --   --    GFR: Estimated Creatinine Clearance: 39.6 mL/min (by C-G formula based on SCr of 0.7 mg/dL). Liver Function Tests: Recent Labs  Lab 06/24/18 0558 06/26/18 0644 06/27/18 1145  AST 44* 49* 71*  ALT 40 50* 62*  ALKPHOS 81 77 76  BILITOT 0.8 0.7 0.7  PROT 5.2* 4.6* 4.2*  ALBUMIN 1.9* 1.7* 1.5*   No results for input(s): LIPASE, AMYLASE in the last 168 hours. No results for input(s): AMMONIA in the last 168 hours. Coagulation Profile: No results for input(s): INR, PROTIME in the last 168 hours. Cardiac Enzymes: No results for input(s): CKTOTAL, CKMB, CKMBINDEX, TROPONINI in the last 168 hours. BNP (last 3 results) No results for input(s): PROBNP in the last 8760 hours. HbA1C: No results for input(s): HGBA1C in the last 72 hours. CBG: Recent Labs  Lab  06/27/18 0751 06/27/18 1143 06/27/18 1630 06/27/18 2025 06/28/18 0759  GLUCAP 82 74 74 74 75   Lipid Profile: No results for input(s): CHOL, HDL, LDLCALC, TRIG, CHOLHDL, LDLDIRECT in the last 72 hours. Thyroid Function Tests: No results for input(s): TSH, T4TOTAL, FREET4, T3FREE, THYROIDAB in the last 72 hours. Anemia Panel: No results for input(s): VITAMINB12, FOLATE, FERRITIN, TIBC, IRON, RETICCTPCT in the last 72 hours. Sepsis Labs: Recent Labs  Lab 06/24/18 1644 06/25/18 0556 06/26/18 0644  PROCALCITON 0.41 0.51 0.57    Recent Results (from the past 240 hour(s))  Urine Culture     Status: Abnormal   Collection Time: 06/20/18  9:44 AM  Result Value Ref Range Status   Specimen Description   Final    URINE, CLEAN CATCH Performed at [facility], [place] [address] [place],  [place] [address]    Special Requests   Final    NONE Performed at [facility], [place] [address], [place], [place] [address]    Culture MULTIPLE SPECIES PRESENT, SUGGEST RECOLLECTION (A)  Final   Report Status 06/24/2018 FINAL  Final  Culture, blood (routine x 2)     Status: None   Collection Time: 06/20/18  2:54 PM  Result Value Ref Range Status   Specimen Description   Final    BLOOD LEFT HAND Performed at [place] [address] [place], [place] [address]    Special Requests   Final    BOTTLES DRAWN AEROBIC ONLY Blood Culture adequate volume Performed at [place] [address] [place], [place] [address]    Culture   Final    NO GROWTH 5 DAYS Performed at [facility], [place] [address], [place], [place] [address]    Report Status 06/25/2018 FINAL  Final  Culture, blood (routine x 2)     Status: Abnormal   Collection Time: 06/20/18  2:54 PM  Result Value Ref Range Status   Specimen Description   Final    BLOOD RIGHT HAND Performed at [place] [address], [place], [place] [address]    Special Requests   Final    BOTTLES DRAWN AEROBIC ONLY Blood Culture adequate volume Performed at [facility] [address] [place], [place] [address]    Culture  Setup Time   Final    GRAM POSITIVE COCCI AEROBIC BOTTLE ONLY CRITICAL RESULT CALLED TO, READ BACK BY AND VERIFIED WITH: L.PONDEXTER,PHARMD 2359 06/21/18 M.CAMPBELL    Culture (A)  Final    STAPHYLOCOCCUS SPECIES (COAGULASE NEGATIVE) THE SIGNIFICANCE OF ISOLATING THIS ORGANISM FROM A SINGLE SET OF BLOOD CULTURES WHEN MULTIPLE SETS ARE DRAWN IS UNCERTAIN. PLEASE NOTIFY THE MICROBIOLOGY DEPARTMENT WITHIN ONE WEEK IF SPECIATION AND SENSITIVITIES ARE REQUIRED. Performed at [facility], [place] [address] [place],  [address]    Report Status 06/23/2018 FINAL  Final  Blood Culture ID Panel (Reflexed)     Status:  Abnormal   Collection Time: 06/20/18  2:54 PM  Result Value Ref Range Status   Enterococcus species NOT DETECTED NOT DETECTED Final   Listeria monocytogenes NOT DETECTED NOT DETECTED Final   Staphylococcus species DETECTED (A) NOT DETECTED Final    Comment: Methicillin (oxacillin) resistant coagulase negative staphylococcus. Possible blood culture contaminant (unless isolated from more than one blood culture draw or clinical case suggests pathogenicity). No antibiotic treatment is indicated for blood  culture contaminants. CRITICAL RESULT CALLED TO, READ BACK BY AND VERIFIED  WITH: L.PONDEXTER,PHARMD 2359 06/21/18 M.CAMPBELL    Staphylococcus aureus (BCID) NOT DETECTED NOT DETECTED Final   Methicillin resistance DETECTED (A) NOT DETECTED Final    Comment: CRITICAL RESULT CALLED TO, READ BACK BY AND VERIFIED WITH: L.PONDEXTER,PHARMD 2359 06/21/18 M.CAMPBELL    Streptococcus species NOT DETECTED NOT DETECTED Final   Streptococcus agalactiae NOT DETECTED NOT DETECTED Final   Streptococcus pneumoniae NOT DETECTED NOT DETECTED Final   Streptococcus pyogenes NOT DETECTED NOT DETECTED Final   Acinetobacter baumannii NOT DETECTED NOT DETECTED Final   Enterobacteriaceae species NOT DETECTED NOT DETECTED Final   Enterobacter cloacae complex NOT DETECTED NOT DETECTED Final   Escherichia coli NOT DETECTED NOT DETECTED Final   Klebsiella oxytoca NOT DETECTED NOT DETECTED Final   Klebsiella pneumoniae NOT DETECTED NOT DETECTED Final   Proteus species NOT DETECTED NOT DETECTED Final   Serratia marcescens NOT DETECTED NOT DETECTED Final   Haemophilus influenzae NOT DETECTED NOT DETECTED Final   Neisseria meningitidis NOT DETECTED NOT DETECTED Final   Pseudomonas aeruginosa NOT DETECTED NOT DETECTED Final   Candida albicans NOT DETECTED NOT DETECTED Final   Candida glabrata NOT DETECTED NOT DETECTED Final   Candida krusei NOT DETECTED NOT DETECTED Final   Candida parapsilosis NOT DETECTED NOT  DETECTED Final   Candida tropicalis NOT DETECTED NOT DETECTED Final    Comment: Performed at [facility], [place] [address] [place], [place] [address]  Culture, blood (routine x 2)     Status: None   Collection Time: 06/22/18  9:29 AM  Result Value Ref Range Status   Specimen Description   Final    BLOOD LEFT HAND Performed at [place] [address], [place], [place] [address]    Special Requests   Final    BOTTLES DRAWN AEROBIC AND ANAEROBIC Blood Culture adequate volume Performed at [place] [address], [place], [place] [address]    Culture   Final    NO GROWTH 5 DAYS Performed at [facility], [place] [address] [place], [place] [address]    Report Status 06/27/2018 FINAL  Final  Culture, blood (routine x 2)     Status: None   Collection Time: 06/22/18  9:29 AM  Result Value Ref Range Status   Specimen Description   Final    BLOOD RIGHT ARM Performed at [place] [address] [place], [place] [address]    Special Requests   Final    BOTTLES DRAWN AEROBIC ONLY Blood Culture results may not be optimal due to an inadequate volume of blood received in culture bottles Performed at [facility] [address] [place], [place] [address]    Culture   Final    NO GROWTH 5 DAYS Performed at [facility], [place] [address], [place], [place] [address]    Report Status 06/27/2018 FINAL  Final  MRSA PCR Screening     Status: Abnormal   Collection Time: 06/23/18  9:11 AM  Result Value Ref Range Status   MRSA by PCR POSITIVE (A) NEGATIVE Final    Comment:        The GeneXpert MRSA Assay (FDA approved for NASAL specimens only), is one component of a comprehensive MRSA colonization surveillance program. It is not intended to diagnose MRSA infection nor to guide or monitor treatment for MRSA infections. RESULT CALLED TO, READ BACK BY AND VERIFIED WITH: FRASER,B @ 1158 ON  [id number] BY POTEAT,S Performed at [facility], [place] [address], [place], [place] [address]   Culture, Urine     Status: Abnormal   Collection Time: 06/23/18  9:44 AM  Result Value Ref Range Status   Specimen Description   Final    URINE, CLEAN CATCH Performed at [facility], [place] [address], [place], [place] [address]    Special Requests   Final    NONE Performed at [facility], [place] [address], [place], [place] [address]    Culture MULTIPLE SPECIES PRESENT, SUGGEST RECOLLECTION (A)  Final   Report Status 06/25/2018 FINAL  Final  Culture, blood (routine x 2)     Status: None (Preliminary result)   Collection Time: 06/25/18 11:07 PM  Result Value Ref Range Status   Specimen Description   Final    BLOOD RIGHT HAND Performed at [facility], [place] [address], [place], [place] [address]    Special Requests   Final    AEROBIC BOTTLE ONLY Blood Culture results may not be optimal due to an inadequate volume of blood received in culture bottles Performed at [place] [address], [place], [place] [address]    Culture   Final    NO GROWTH 1 DAY Performed at [facility], [place] [address], [place], [place] [address]    Report Status PENDING  Incomplete  Culture, blood (routine x 2)     Status: None (Preliminary result)   Collection Time: 06/25/18 11:07 PM  Result Value Ref Range Status   Specimen Description   Final    BLOOD LEFT HAND Performed at [facility], [place] [address] [place], [place] [address]    Special Requests   Final    AEROBIC BOTTLE ONLY Blood Culture results may not be optimal due to an inadequate volume of blood received in culture bottles Performed at [place] [address], [place], [place] [address]    Culture   Final    NO GROWTH 1 DAY Performed at [facility], [place] [address], [place], [place] [address]    Report Status PENDING   Incomplete         Radiology Studies: No results found.      Scheduled Meds: . sodium chloride   Intravenous Once  . carvedilol  6.25 mg Oral BID  . Chlorhexidine Gluconate Cloth  6 each Topical Q0600  . ferrous sulfate  325 mg Oral BID WC  . folic acid  2 mg Oral Daily  . insulin aspart  0-9 Units Subcutaneous TID WC  . levETIRAcetam  500 mg Oral BID  . levothyroxine  100 mcg Oral Q0600  . magnesium oxide  400 mg Oral Daily  . mupirocin ointment  1 application Nasal BID  . pantoprazole  40 mg Oral Daily  . potassium chloride SA  20 mEq Oral Daily  . sertraline  50 mg Oral Daily  . sodium phosphate  1 enema Rectal Once   Continuous Infusions: . ceFEPime (MAXIPIME) IV 1 g (06/28/18 1038)     LOS: 6 days    Time spent: [place], MD [facility]  If 7PM-7AM, please contact night-coverage [email or web address] Password [facility] 06/28/2018, 11:38 AM

## 2018-06-28 NOTE — Progress Notes (Addendum)
[place] [facility]) [place] RN Visit at [phone] am  Received referral for Jean Hodges from [place], [place]. I have attempted to call family to arrange a meeting to discuss interest and complete paper work. I have called son, Jean Hodges [phone])  twice and left a message with no return of phone call and I have also attempted to call Jean Hodges (daughter) at [phone] (message states the mailbox is full) and am unable to leave a message.  I have notified Pricilla Holm, LCSW, Dr. Herbert Moors and the bedside RN of my inability to contact family.  [facility] will continue to follow for [facility] referral.  Please call with any questions or concerns.  Thank you, Margaretmary Eddy, RN, [facility] Liaison [phone]  [place] are on AMION.  Addendum at 1500: conference call with daughter Jean Hodges and son Jean Hodges. Explained services and answered questions. Registration paper work is not able to be completed in person. Son lives 3 hours away and daughter lives 2 hours away. They have asked for hospice liason to call Richard in the morning at [phone] to obtain his work fax number, fax paper work and he can sign and return paper work. Both Cyndi and Jean Hodges are aware eligibility is pending and paper work needs to be completed prior to transfer.

## 2018-06-28 NOTE — Plan of Care (Signed)
?  Problem: Elimination: ?Goal: Will not experience complications related to bowel motility ?Outcome: Progressing ?  ?Problem: Pain Managment: ?Goal: General experience of comfort will improve ?Outcome: Progressing ?  ?Problem: Safety: ?Goal: Ability to remain free from injury will improve ?Outcome: Progressing ?  ?

## 2018-06-28 NOTE — Progress Notes (Addendum)
Hypoglycemic Event  CBG: 65  Treatment: D 50  42mL  Symptoms: None  Follow-up CBG: Time: 1810 CBG Result: 136  Possible Reasons for Event: Pt not eating  Comments/MD notified: Dr Delorse Lek, [facility]

## 2018-06-28 NOTE — Progress Notes (Signed)
CSW consulted for residential hospice placement [facility].  Sent referral to hospice liaison, Olivia Mackie, who will f/u with family.   Pricilla Holm, MSW, [place] Social Work [phone]

## 2018-06-29 DIAGNOSIS — L89159 Pressure ulcer of sacral region, unspecified stage: Secondary | ICD-10-CM

## 2018-06-29 DIAGNOSIS — I739 Peripheral vascular disease, unspecified: Secondary | ICD-10-CM

## 2018-06-29 DIAGNOSIS — F0280 Dementia in other diseases classified elsewhere without behavioral disturbance: Secondary | ICD-10-CM

## 2018-06-29 DIAGNOSIS — G309 Alzheimer's disease, unspecified: Secondary | ICD-10-CM

## 2018-06-29 DIAGNOSIS — Z515 Encounter for palliative care: Secondary | ICD-10-CM

## 2018-06-29 DIAGNOSIS — E1151 Type 2 diabetes mellitus with diabetic peripheral angiopathy without gangrene: Secondary | ICD-10-CM

## 2018-06-29 DIAGNOSIS — I5022 Chronic systolic (congestive) heart failure: Secondary | ICD-10-CM

## 2018-06-29 DIAGNOSIS — G40409 Other generalized epilepsy and epileptic syndromes, not intractable, without status epilepticus: Secondary | ICD-10-CM

## 2018-06-29 LAB — GLUCOSE, CAPILLARY
Glucose-Capillary: 65 mg/dL — ABNORMAL LOW (ref 70–99)
Glucose-Capillary: 136 mg/dL — ABNORMAL HIGH (ref 70–99)
Glucose-Capillary: 119 mg/dL — ABNORMAL HIGH (ref 70–99)
Glucose-Capillary: 138 mg/dL — ABNORMAL HIGH (ref 70–99)
Glucose-Capillary: 131 mg/dL — ABNORMAL HIGH (ref 70–99)
Glucose-Capillary: 92 mg/dL (ref 70–99)

## 2018-06-29 MED ORDER — MORPHINE SULFATE (CONCENTRATE) 10 MG/0.5ML PO SOLN: 5.0000 mg | ORAL | 0 refills | Status: AC | PRN

## 2018-06-29 MED ORDER — ONDANSETRON HCL 4 MG PO TABS: 4.0000 mg | ORAL_TABLET | Freq: Every day | ORAL | 1 refills | Status: AC | PRN

## 2018-06-29 NOTE — Progress Notes (Signed)
Report called to Clarene Critchley at [facility].

## 2018-06-29 NOTE — Progress Notes (Signed)
Nutrition Brief Note  Chart reviewed. Pt now DNR and will d/c to residential hospice. Per palliative care note, prognosis is estimated to be <2 weeks.  No further nutrition interventions warranted at this time.    Clayton Bibles, MS, RD, [place] Dietitian Pager: [phone] After Hours Pager: [phone]

## 2018-06-29 NOTE — Care Management Important Message (Signed)
Important Message  Patient Details  Name: Jean Hodges MRN: [id number] Date of Birth: [birth year]   Medicare Important Message Given:  Yes    Kerin Salen 06/29/2018, 2:24 [place] Message  Patient Details  Name: Jean Hodges MRN: [id number] Date of Birth: 10-29-1933   Medicare Important Message Given:  Yes    Kerin Salen 06/29/2018, 2:24 PM

## 2018-06-29 NOTE — Discharge Summary (Signed)
Physician Discharge Summary  Jean Hodges NFA:[id number] DOB: [birth year] DOA: 06/19/2018  PCP: Patient, No Pcp Per  Admit date: 06/19/2018 Discharge date: 06/29/2018  Time spent: >35 minutes  Recommendations for Outpatient Follow-up:  Hospice   Discharge Diagnoses:  Active Problems:   Tonic-clonic seizure disorder (HCC)   Hypothyroidism   Type 2 diabetes mellitus with peripheral vascular disease (HCC)   PVD (peripheral vascular disease) ([place])   Alzheimer's dementia ([place])   Symptomatic anemia   Severe protein-calorie malnutrition (HCC)   Adult failure to thrive   Chronic systolic CHF (congestive heart failure) (HCC)   Sacral decubitus ulcer   Anemia   Goals of care, counseling/discussion   Dying care   Discharge Condition: stable   Diet recommendation: comfort   Filed Weights   06/27/18 0657 06/28/18 0600 06/29/18 0530  Weight: 47.2 kg 46.3 kg 47.1 kg    History of present illness:   84 year old with past medical history relevant for severe Alzheimer's dementia, protein calorie malnutrition/failure to thrive, hypothyroidism, Large sacral decubitus ulcers, hypothyroidism, seizure disorder, peripheral vascular disease, depression/anxiety, transfusion dependent myelodysplastic syndrome admitted with anemia with a hospital course complicated by fevers.   Hospital Course:   Advanced Alzheimer's dementia:End stage Dementia. Her p.o. intake continues to be poor. After extensive discussion with palliative care and the family they have decided to transition the patient to DNR and pursue hospice.   Fevers: Developed fevers to T-max of 103. These resolved with initiation of antibiotics but no clear microbiological source.Procalcitonin was elevated. Blood cultures from 06/20/2018 1 out of 2 growing coagulase-negative staph Blood cultures from 06/22/2018 no growth to date.  Acute on chronic systolic heart failure status post AICD: Chest x-ray on admission shows evidence of  pulmonary edema. Echo on 11/07/2017 shows EF of 20 to 25% with moderate to severe MR as well as evidence of pulmonary hypertension with moderate to severe tricuspid regurgitation. EP consulted to have [facility] ICD/by V pacer shut off (only ICD portion we shot off at the request of the son)  Presumed myelodysplastic syndrome complicated by recalcitrant anemia: Patient apparently is refused biopsy for this myelodysplastic syndrome though hospice was recommended by [facility]. Status post 2 units packed red blood cells, patient is transfusion dependent  atrial fibrillation: Not a candidate for anticoagulation  Sacral unstageable ulcer, right BKA stump with stage II ulcerwith pain: These appear to be the most relevant areas of pain for the patient. Wound care followed  Peripheral vascular disease/coronary artery disease status post CABG:   Seizure disorder: Continue levetiracetam 500 mg twice daily  Prognosis is very poor with end stage dementia, ongoing chronic progressive heart failure, severe protein calorie malnutrition, at risk for recurrent infection  -hospice care   Procedures:  none (i.e. Studies not automatically included, echos, thoracentesis, etc; not x-rays)  Consultations:  Palliative   Discharge Exam: Vitals:   06/29/18 0018 06/29/18 0412  BP: (!) 71/23 (!) 76/25  Pulse: 78 79  Resp:  12  Temp:  (!) 97.5 F (36.4 C)  SpO2:  98%    General: no distress  Cardiovascular: s1,s2 rrr Respiratory: CTA BL  Discharge Instructions  Discharge Instructions    Diet - low sodium heart healthy   Complete by:  As directed    Increase activity slowly   Complete by:  As directed      Allergies as of 06/29/2018      Reactions   Acetaminophen Other (See Comments)   Unknown- patient can take APAP per Dr  Purohit/ patient's son 06/24/18.   Codeine Nausea Only   Dilaudid [hydromorphone] Nausea Only   Hydrocodone Other (See Comments)   Hallucinations   Keflex  [cephalexin] Diarrhea      Medication List    STOP taking these medications   BIOFREEZE 4 % Gel Generic drug:  Menthol (Topical Analgesic)   carvedilol 6.25 MG tablet Commonly known as:  COREG   DECUBI-VITE PO   feeding supplement (ENSURE ENLIVE) Liqd   ferrous sulfate 325 (65 FE) MG tablet   folic acid 1 MG tablet Commonly known as:  FOLVITE   levothyroxine 100 MCG tablet Commonly known as:  SYNTHROID, LEVOTHROID   magnesium oxide 400 MG tablet Commonly known as:  MAG-OX   oxyCODONE 5 MG immediate release tablet Commonly known as:  Oxy IR/ROXICODONE   pantoprazole 40 MG tablet Commonly known as:  PROTONIX   potassium chloride SA 20 MEQ tablet Commonly known as:  K-DUR,KLOR-CON   sertraline 50 MG tablet Commonly known as:  ZOLOFT     TAKE these medications   levETIRAcetam 500 MG tablet Commonly known as:  KEPPRA Take 500 mg by mouth 2 (two) times daily.   loperamide 2 MG capsule Commonly known as:  IMODIUM Take 2 capsules (4 mg total) by mouth as needed for diarrhea or loose stools.   morphine CONCENTRATE 10 MG/0.5ML Soln concentrated solution Place 0.25 mLs (5 mg total) under the tongue every 3 (three) hours as needed for up to 5 days for severe pain.   ondansetron 4 MG tablet Commonly known as:  ZOFRAN Take 1 tablet (4 mg total) by mouth daily as needed for nausea or vomiting. What changed:  when to take this      Allergies  Allergen Reactions  . Acetaminophen Other (See Comments)    Unknown- patient can take APAP per Dr Purohit/ patient's son 06/24/18.  . Codeine Nausea Only  . Dilaudid [Hydromorphone] Nausea Only  . Hydrocodone Other (See Comments)    Hallucinations  . Keflex [Cephalexin] Diarrhea      The results of significant diagnostics from this hospitalization (including imaging, microbiology, ancillary and laboratory) are listed below for reference.    Significant Diagnostic Studies: Dg Chest 2 View  Result Date:  06/19/2018 CLINICAL DATA:  Leukocytosis EXAM: CHEST - 2 VIEW COMPARISON:  04/03/2018 FINDINGS: Small bilateral pleural effusions. Bilateral interstitial and alveolar airspace opacities. No pneumothorax. Stable cardiomegaly. Prior median sternotomy. Cardiac pacemaker is noted. No aggressive osseous lesion. IMPRESSION: 1. Findings concerning for pulmonary edema. Electronically Signed   By: Kathreen Devoid   On: 06/19/2018 16:58   Ct Head Wo Contrast  Result Date: 06/20/2018 CLINICAL DATA:  Altered level of consciousness. EXAM: CT HEAD WITHOUT CONTRAST TECHNIQUE: Contiguous axial images were obtained from the base of the skull through the vertex without intravenous contrast. COMPARISON:  04/05/2018 FINDINGS: Brain: There is no evidence for acute hemorrhage, hydrocephalus, mass lesion, or abnormal extra-axial fluid collection. No definite CT evidence for acute infarction. Diffuse loss of parenchymal volume is consistent with atrophy. Patchy low attenuation in the deep hemispheric and periventricular white matter is nonspecific, but likely reflects chronic microvascular ischemic demyelination. Old right occipital lobe infarct is similar to prior. Vascular: No hyperdense vessel or unexpected calcification. Skull: No evidence for fracture. No worrisome lytic or sclerotic lesion. Sinuses/Orbits: Stable appearance of fluid in the right mastoid air cells. Left mastoid air cells and visualized paranasal sinuses are clear. Visualized portions of the globes and intraorbital fat are unremarkable. Other: None. IMPRESSION: 1.  Stable.  No acute intracranial abnormality. 2. Atrophy with chronic small vessel white matter ischemic disease. 3. 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 Electronically Signed   By: Misty Stanley M.D.   On: 06/20/2018 17:04   Dg Chest Port 1 View  Result Date: 06/22/2018 CLINICAL DATA:  CHF, HTN, diabetes, r/o pneumonia EXAM: PORTABLE CHEST - 1 VIEW COMPARISON:  06/19/2018 FINDINGS: Central pulmonary vascular  congestion. Moderate right and smaller left pleural effusions. Consolidation/atelectasis in the lung bases left greater than right. Stable cardiomegaly. Aortic Atherosclerosis (ICD10-170.0). Left subclavian AICD stable. Previous median sternotomy. No definite pneumothorax. DJD in bilateral shoulders. IMPRESSION: Stable cardiomegaly, vascular congestion, and pleural effusions. Electronically Signed   By: Lucrezia Europe M.D.   On: 06/22/2018 09:34    Microbiology: Recent Results (from the past 240 hour(s))  Urine Culture     Status: Abnormal   Collection Time: 06/20/18  9:44 AM  Result Value Ref Range Status   Specimen Description   Final    URINE, CLEAN CATCH Performed at [facility], [place] [address], [place], [place] [address]    Special Requests   Final    NONE Performed at [facility], [place] [address], [place], [place] [address]    Culture MULTIPLE SPECIES PRESENT, SUGGEST RECOLLECTION (A)  Final   Report Status 06/24/2018 FINAL  Final  Culture, blood (routine x 2)     Status: None   Collection Time: 06/20/18  2:54 PM  Result Value Ref Range Status   Specimen Description   Final    BLOOD LEFT HAND Performed at [facility] [address], [place], [place] [address]    Special Requests   Final    BOTTLES DRAWN AEROBIC ONLY Blood Culture adequate volume Performed at [facility] [address] [place], [place] [address]    Culture   Final    NO GROWTH 5 DAYS Performed at [facility], [place] [address] [place], [place] [address]    Report Status 06/25/2018 FINAL  Final  Culture, blood (routine x 2)     Status: Abnormal   Collection Time: 06/20/18  2:54 PM  Result Value Ref Range Status   Specimen Description   Final    BLOOD RIGHT HAND Performed at [facility] [address] [place], [place] [address]    Special Requests   Final    BOTTLES DRAWN AEROBIC ONLY Blood Culture  adequate volume Performed at [facility] [address] [place], [place] [address]    Culture  Setup Time   Final    GRAM POSITIVE COCCI AEROBIC BOTTLE ONLY CRITICAL RESULT CALLED TO, READ BACK BY AND VERIFIED WITH: L.PONDEXTER,PHARMD 2359 06/21/18 M.CAMPBELL    Culture (A)  Final    STAPHYLOCOCCUS SPECIES (COAGULASE NEGATIVE) THE SIGNIFICANCE OF ISOLATING THIS ORGANISM FROM A SINGLE SET OF BLOOD CULTURES WHEN MULTIPLE SETS ARE DRAWN IS UNCERTAIN. PLEASE NOTIFY THE MICROBIOLOGY DEPARTMENT WITHIN ONE WEEK IF SPECIATION AND SENSITIVITIES ARE REQUIRED. Performed at [facility], [place] [address], [place],  [address]    Report Status 06/23/2018 FINAL  Final  Blood Culture ID Panel (Reflexed)     Status: Abnormal   Collection Time: 06/20/18  2:54 PM  Result Value Ref Range Status   Enterococcus species NOT DETECTED NOT DETECTED Final   Listeria monocytogenes NOT DETECTED NOT DETECTED Final   Staphylococcus species DETECTED (A) NOT DETECTED Final    Comment: Methicillin (oxacillin) resistant  coagulase negative staphylococcus. Possible blood culture contaminant (unless isolated from more than one blood culture draw or clinical case suggests pathogenicity). No antibiotic treatment is indicated for blood  culture contaminants. CRITICAL RESULT CALLED TO, READ BACK BY AND VERIFIED WITH: L.PONDEXTER,PHARMD 2359 06/21/18 M.CAMPBELL    Staphylococcus aureus (BCID) NOT DETECTED NOT DETECTED Final   Methicillin resistance DETECTED (A) NOT DETECTED Final    Comment: CRITICAL RESULT CALLED TO, READ BACK BY AND VERIFIED WITH: L.PONDEXTER,PHARMD 2359 06/21/18 M.CAMPBELL    Streptococcus species NOT DETECTED NOT DETECTED Final   Streptococcus agalactiae NOT DETECTED NOT DETECTED Final   Streptococcus pneumoniae NOT DETECTED NOT DETECTED Final   Streptococcus pyogenes NOT DETECTED NOT DETECTED Final   Acinetobacter baumannii NOT DETECTED NOT DETECTED Final    Enterobacteriaceae species NOT DETECTED NOT DETECTED Final   Enterobacter cloacae complex NOT DETECTED NOT DETECTED Final   Escherichia coli NOT DETECTED NOT DETECTED Final   Klebsiella oxytoca NOT DETECTED NOT DETECTED Final   Klebsiella pneumoniae NOT DETECTED NOT DETECTED Final   Proteus species NOT DETECTED NOT DETECTED Final   Serratia marcescens NOT DETECTED NOT DETECTED Final   Haemophilus influenzae NOT DETECTED NOT DETECTED Final   Neisseria meningitidis NOT DETECTED NOT DETECTED Final   Pseudomonas aeruginosa NOT DETECTED NOT DETECTED Final   Candida albicans NOT DETECTED NOT DETECTED Final   Candida glabrata NOT DETECTED NOT DETECTED Final   Candida krusei NOT DETECTED NOT DETECTED Final   Candida parapsilosis NOT DETECTED NOT DETECTED Final   Candida tropicalis NOT DETECTED NOT DETECTED Final    Comment: Performed at [facility], [place] [address], [place], [place] [address]  Culture, blood (routine x 2)     Status: None   Collection Time: 06/22/18  9:29 AM  Result Value Ref Range Status   Specimen Description   Final    BLOOD LEFT HAND Performed at [facility] [address] [place], [place] [address]    Special Requests   Final    BOTTLES DRAWN AEROBIC AND ANAEROBIC Blood Culture adequate volume Performed at [facility] [address] [place], [place] [address]    Culture   Final    NO GROWTH 5 DAYS Performed at [facility], [place] [address], [place], [place] [address]    Report Status 06/27/2018 FINAL  Final  Culture, blood (routine x 2)     Status: None   Collection Time: 06/22/18  9:29 AM  Result Value Ref Range Status   Specimen Description   Final    BLOOD RIGHT ARM Performed at [facility] [address] [place], [place] [address]    Special Requests   Final    BOTTLES DRAWN AEROBIC ONLY Blood Culture results may not be optimal due to an inadequate volume of blood received in culture  bottles Performed at [facility] [address] [place], [place] [address]    Culture   Final    NO GROWTH 5 DAYS Performed at [facility], [place] [address] [place], [place] [address]    Report Status 06/27/2018 FINAL  Final  MRSA PCR Screening     Status: Abnormal   Collection Time: 06/23/18  9:11 AM  Result Value Ref Range Status   MRSA by PCR POSITIVE (A) NEGATIVE Final    Comment:        The GeneXpert MRSA Assay (FDA approved for NASAL specimens only), is one component of a comprehensive MRSA  colonization surveillance program. It is not intended to diagnose MRSA infection nor to guide or monitor treatment for MRSA infections. RESULT CALLED TO, READ BACK BY AND VERIFIED WITH: FRASER,B @ 1158 ON 121019 BY POTEAT,S Performed at [facility] [address] [place], [place] [address]   Culture, Urine     Status: Abnormal   Collection Time: 06/23/18  9:44 AM  Result Value Ref Range Status   Specimen Description   Final    URINE, CLEAN CATCH Performed at [facility], [place] [address] [place], [place] [address]    Special Requests   Final    NONE Performed at [facility], [place] [address], [place], [place] [address]    Culture MULTIPLE SPECIES PRESENT, SUGGEST RECOLLECTION (A)  Final   Report Status 06/25/2018 FINAL  Final  Culture, blood (routine x 2)     Status: None (Preliminary result)   Collection Time: 06/25/18 11:07 PM  Result Value Ref Range Status   Specimen Description   Final    BLOOD RIGHT HAND Performed at [facility], [place] [address], [place], [place] [address]    Special Requests   Final    AEROBIC BOTTLE ONLY Blood Culture results may not be optimal due to an inadequate volume of blood received in culture bottles Performed at [facility] [address] [place], [place] [address]    Culture   Final    NO GROWTH 3 DAYS Performed at [facility], [place] [address] [place], [place] [address]    Report Status PENDING  Incomplete  Culture, blood (routine x 2)     Status: None (Preliminary result)   Collection Time: 06/25/18 11:07 PM  Result Value Ref Range Status   Specimen Description   Final    BLOOD LEFT HAND Performed at [facility], [place] [address], [place], [place] [address]    Special Requests   Final    AEROBIC BOTTLE ONLY Blood Culture results may not be optimal due to an inadequate volume of blood received in culture bottles Performed at [place] [address], [place], [place] [address]    Culture   Final    NO GROWTH 3 DAYS Performed at [facility], [place] [address] [place], [place] [address]    Report Status PENDING  Incomplete     Labs: Basic Metabolic Panel: Recent Labs  Lab 06/23/18 0619 06/24/18 0558 06/25/18 2307 06/26/18 0644 06/27/18 1145  NA 144 146* 145 146* 148*  K 4.1 4.2 3.3* 3.3* 3.6  CL 108 111 114* 112* 116*  CO2 29 28 25 25 25   GLUCOSE 176* 157* 167* 174* 85  BUN 42* 45* 47* 49* 50*  CREATININE 0.60 0.62 0.53 0.64 0.70  CALCIUM 8.3* 8.2* 8.0* 8.3* 8.3*  MG  --  1.8  --   --   --    Liver Function Tests: Recent Labs  Lab 06/24/18 0558 06/26/18 0644 06/27/18 1145  AST 44* 49* 71*  ALT 40 50* 62*  ALKPHOS 81 77 76  BILITOT 0.8 0.7 0.7  PROT 5.2* 4.6* 4.2*  ALBUMIN 1.9* 1.7* 1.5*   No results for input(s): LIPASE, AMYLASE in the last 168 hours. No results for input(s): AMMONIA in the last 168 hours. CBC: Recent Labs  Lab 06/23/18 0619 06/24/18 0558 06/25/18 2307 06/26/18 0644 06/27/18 1145  WBC 10.3 12.4* 7.2 7.5 6.4  NEUTROABS 9.5*  --   --   --   --  HGB 9.8* 10.5* 9.7* 9.9* 9.7*  HCT 32.2* 34.6* 32.4* 32.7* 31.3*  MCV 105.9* 103.3* 105.2* 107.6* 100.6*  PLT 97* 90* 65* 69* 46*   Cardiac Enzymes: No results for input(s): CKTOTAL, CKMB, CKMBINDEX, TROPONINI in the last 168 hours. BNP: BNP (last 3 results) No results  for input(s): BNP in the last 8760 hours.  ProBNP (last 3 results) No results for input(s): PROBNP in the last 8760 hours.  CBG: Recent Labs  Lab 06/28/18 1223 06/28/18 1640 06/28/18 1809 06/28/18 1940 06/29/18 0738  GLUCAP 71 65* 136* 119* 138*       Signed:  Fusae Florio N  [facility] 06/29/2018, 11:21 AM

## 2018-06-29 NOTE — Progress Notes (Signed)
Patient to transfer to [facility].   LCSW faxed dc docs to facility.   RN report #  [place], [place] [phone]

## 2018-06-29 NOTE — Progress Notes (Signed)
Daily Progress Note   Patient Name: Jean Hodges       Date: 06/29/2018 DOB: February 02, 1934  Age: 84 y.o. MRN#: [id number] Attending Physician: Kinnie Feil, MD Primary Care Physician: Patient, No Pcp Per Admit Date: 06/19/2018  Reason for Consultation/Follow-up: Establishing goals of care  Subjective:  remains frail weak Non verbal PO intake is minimal to nil Family meeting held on 12-14 Unsafe with PO medications now, high risk of aspiration.  See below  Length of Stay: 7  Current Medications: Scheduled Meds:  . sodium chloride   Intravenous Once  . carvedilol  6.25 mg Oral BID  . Chlorhexidine Gluconate Cloth  6 each Topical Q0600  . ferrous sulfate  325 mg Oral BID WC  . folic acid  2 mg Oral Daily  . insulin aspart  0-9 Units Subcutaneous TID WC  . levETIRAcetam  500 mg Oral BID  . levothyroxine  100 mcg Oral Q0600  . magnesium oxide  400 mg Oral Daily  . mupirocin ointment  1 application Nasal BID  . pantoprazole  40 mg Oral Daily  . potassium chloride SA  20 mEq Oral Daily  . sertraline  50 mg Oral Daily  . sodium phosphate  1 enema Rectal Once    Continuous Infusions: . ceFEPime (MAXIPIME) IV 1 g (06/29/18 0955)  . dextrose 5 % and 0.45% NaCl 75 mL/hr at 06/29/18 0749    PRN Meds: acetaminophen, hydrALAZINE, loperamide, morphine injection, morphine CONCENTRATE, ondansetron **OR** ondansetron (ZOFRAN) IV, oxyCODONE  Physical Exam         Frail weak lady Scattered rhonchi irreg irreg S 1 S 2  Abdomen is soft Has edema Has large decub ulcers Has contusions  Vital Signs: BP (!) 76/25 Comment: RN notified  Pulse 79   Temp (!) 97.5 F (36.4 C)   Resp 12   Wt 47.1 kg   SpO2 98%   BMI 18.39 kg/m  SpO2: SpO2: 98 % O2 Device: O2 Device: Room Air O2 Flow  Rate:    Intake/output summary:   Intake/Output Summary (Last 24 hours) at 06/29/2018 1115 Last data filed at 06/29/2018 0700 Gross per 24 hour  Intake 934.97 ml  Output -  Net 934.97 ml   LBM: Last BM Date: 06/27/18 Baseline Weight: Weight: 46.9 kg Most recent weight: Weight: 47.1 kg  Palliative Assessment/Data: PPS 20%   Flowsheet Rows     Most Recent Value  Intake Tab  Referral Department  Hospitalist  Unit at Time of Referral  Med/Surg Unit  Palliative Care Primary Diagnosis  Other (Comment) [advanced end-stage Alzheimer dementia]  Date Notified  06/20/18  Palliative Care Type  Return patient Palliative Care  Reason for referral  Clarify Goals of Care  Date of Admission  06/19/18  # of days IP prior to Palliative referral  1  Clinical Assessment  Psychosocial & Spiritual Assessment  Palliative Care Outcomes      Patient Active Problem List   Diagnosis Date Noted  . Goals of care, counseling/discussion   . Anemia 06/19/2018  . Decubitus ulcer of coccygeal region, unstageable ([place]) 05/15/2018  . Pressure ulcer of BKA stump ([place]) 05/15/2018  . Refractory anemia due to myelodysplastic syndrome ([place]) 05/14/2018  . Type 2 diabetes mellitus ([place]) 05/14/2018  . GERD (gastroesophageal reflux disease) 05/14/2018  . Chronic pain 05/14/2018  . Hx of BKA, right ([place]) 04/04/2018  . Chronic systolic CHF (congestive heart failure) ([place]) 02/20/2018  . Sacral decubitus ulcer 02/20/2018  . Counseling regarding advanced care planning and goals of care 01/17/2018  . Large stool   . Abdominal pain 01/16/2018  . Seizure disorder ([place]) 12/29/2017  . Memory loss 12/02/2017  . Bruising, spontaneous 12/01/2017  . Adult failure to thrive   . Palliative care by specialist   . Palliative care encounter   . Altered mental status   . Pressure injury of skin 11/04/2017  . Hyperkalemia 11/04/2017  . UTI (urinary tract infection) 11/03/2017  . Severe protein-calorie malnutrition  ([place]) 10/10/2017  . Macrocytic anemia 07/23/2017  . Symptomatic anemia 11/27/2016  . Tonic-clonic seizure disorder ([place]) 10/09/2016  . Dysphagia 10/09/2016  . Moderate protein-calorie malnutrition ([place]) 10/09/2016  . Hypothyroidism 10/09/2016  . Type 2 diabetes mellitus with peripheral vascular disease ([place]) 10/09/2016  . PVD (peripheral vascular disease) ([place]) 10/09/2016  . HTN (hypertension) 10/09/2016  . Alzheimer's dementia ([place]) 10/09/2016  . Depression 10/09/2016  . Hyperlipidemia 10/09/2016  . Coronary artery disease involving native coronary artery of native heart without angina pectoris 10/09/2016    Palliative Care Assessment & Plan   Patient Profile:    Assessment: Advanced Alzheimer's dementia Acute on chronic systolic heart failure status post AICD Presumed myelodysplastic syndrome complicated by recalcitrant anemia Sacral unstageable ulcer, right BKA stump with stage II ulcer.  Patient has underlying history of seizure disorder, history of peripheral vascular disease, history of coronary artery disease status post CABG, history of ejection fraction 20-5 5% with moderate to severe mitral regurgitation, evidence of pulmonary hypertension, moderate to severe tricuspid regurgitation. Oral intake minimal-nil Functional decline, bed bound status  Recommendations/Plan:   PO Morphine, also add low dose IV Morphine PRN Appreciate [facility] consult Call placed and discussed with medical representative who was here for de activation of ICD, discussed with son regarding this over the phone in am on 06-29-18. Continue current supportive measures Prognosis appears to be few days to less than 2 weeks, at this point.   Await transfer to residential hospice soon.      Code Status:    Code Status Orders  (From admission, onward)         Start     Ordered   06/27/18 1600  Do not attempt resuscitation (DNR)  Continuous    Question Answer Comment  In the event of cardiac or  respiratory ARREST Do not call a "code blue"  In the event of cardiac or respiratory ARREST Do not perform Intubation, CPR, defibrillation or ACLS   In the event of cardiac or respiratory ARREST Use medication by any route, position, wound care, and other measures to relive pain and suffering. May use oxygen, suction and manual treatment of airway obstruction as needed for comfort.      06/27/18 1600        Code Status History    Date Active Date Inactive Code Status Order ID Comments User Context   06/19/2018 2055 06/27/2018 1600 Full Code [id number]  Elmarie Shiley, MD Inpatient   05/14/2018 2018 05/15/2018 2349 Full Code [id number]  Shela Leff, MD ED   04/04/2018 0212 04/06/2018 1753 Full Code [id number]  Gwynne Edinger, MD Inpatient   02/20/2018 2123 02/21/2018 1905 Full Code [id number]  Etta Quill, DO ED   01/16/2018 2309 01/19/2018 1619 Full Code [id number]  Rise Patience, MD Inpatient   12/29/2017 2110 12/30/2017 2059 Full Code [id number]  Rise Patience, MD ED   11/03/2017 1648 11/14/2017 1942 Full Code [id number]  Kayleen Memos, DO ED   10/09/2017 2031 10/10/2017 1831 Full Code [id number]  Norval Morton, MD ED   07/23/2017 1509 07/24/2017 1809 Full Code [id number]  Phillips Grout, MD Inpatient   11/27/2016 2203 11/28/2016 2154 Full Code [id number]  Rise Patience, MD Inpatient    Advance Directive Documentation     Most Recent Value  Type of Advance Directive  Out of facility DNR (pink MOST or yellow form)  Pre-existing out of facility DNR order (yellow form or pink MOST form)  Pink MOST form placed in chart (order not valid for inpatient use)  "MOST" Form in Place?  -       Prognosis:   < 2 weeks  Discharge Planning:  Hospice facility  Care plan was discussed with  [facility], RN, and Dr Daleen Bo [facility] MD. Call placed and also discussed with son Ivet Guerrieri regarding full scope of comfort measures, de activation of ICD and transfer to hospice. Offered active  listening and supportive care and acknowledged that this was a difficult time for him and his family.   Thank you for allowing the Palliative Medicine Team to assist in the care of this patient.   Time In: 8 Time Out: 8.25 Total Time 25 Prolonged Time Billed no      Greater than 50%  of this time was spent counseling and coordinating care related to the above assessment and plan.   Loistine Chance, MD [phone] Please contact Palliative Medicine Team phone at [phone] for questions and concerns.

## 2018-06-29 NOTE — Progress Notes (Signed)
[facility] PROGRESS NOTE  Tallyn Holroyd HEN:[id number] DOB: Nov 25, 1933 DOA: 06/19/2018 PCP: Patient, No Pcp Per  Brief summary   84 year old with past medical history relevant for severe Alzheimer's dementia, protein calorie malnutrition/failure to thrive, hypothyroidism, Large sacral decubitus ulcers, hypothyroidism, seizure disorder, peripheral vascular disease, depression/anxiety, transfusion dependent myelodysplastic syndrome admitted with anemia with a hospital course complicated by fevers.  Assessment/Plan:  Advanced Alzheimer's dementia: End stage Dementia. Her p.o. intake continues to be poor.  After extensive discussion with palliative care and the family they have decided to transition the patient to DNR and pursue hospice. Appreciate alliative care consult. speech-language pathology recommended hand feeding by mouth with understanding that likely goals of care discussion should be had as patient will not improve due to her dementia.  Fevers: Developed fevers to T-max of 103.  These resolved with initiation of antibiotics but no clear microbiological source.  Procalcitonin was elevated. Blood cultures from 06/20/2018 1 out of 2 growing coagulase-negative staph Blood cultures from 06/22/2018 no growth to date. Continue IV cefepime started 06/24/2018, plan to discontinue once patient is discharged to hospice.  Acute on chronic systolic heart failure status post AICD: Chest x-ray on admission shows evidence of pulmonary edema.  Echo on 11/07/2017 shows EF of 20 to 25% with moderate to severe MR as well as evidence of pulmonary hypertension with moderate to severe tricuspid regurgitation. EP consulted to have [facility] ICD/by V pacer shut off (only ICD portion we shot off at the request of the son)  Presumed myelodysplastic syndrome complicated by recalcitrant anemia: Patient apparently is refused biopsy for this myelodysplastic syndrome though hospice was recommended by [facility]. Status post 2 units packed red blood cells, patient is transfusion dependent  atrial fibrillation: Not a candidate for anticoagulation  Sacral unstageable ulcer, right BKA stump with stage II ulcerwith pain: These appear to be the most relevant areas of pain for the patient. Wound care following, appreciate recommendations, they have signed off  Peripheral vascular disease/coronary artery disease status post CABG: Continue carvedilol 6.25 mg twice daily  Seizure disorder: Continue levetiracetam 500 mg twice daily  Hypothyroidism: Continue levothyroxine 100 mcg  Prognosis is very poor with end stage dementia, ongoing chronic progressive heart failure, severe protein calorie malnutrition, at risk for recurrent infection  -hospice care   Code Status: DNR Family Communication: d/w patient, RN, palliative  (indicate person spoken with, relationship, and if by phone, the number) Disposition Plan: pend hospice    Consultants:  EP  Palliative   Procedures:  none  Antibiotics: Anti-infectives (From admission, onward)   Start     Dose/Rate Route Frequency Ordered Stop   06/26/18 1000  vancomycin (VANCOCIN) 500 mg in sodium chloride 0.9 % 100 mL IVPB     500 mg 100 mL/hr over 60 Minutes Intravenous Every 24 hours 06/25/18 1145 06/26/18 1155   06/25/18 1800  aztreonam (AZACTAM) 1 g in sodium chloride 0.9 % 100 mL IVPB  Status:  Discontinued     1 g 200 mL/hr over 30 Minutes Intravenous Every 8 hours 06/25/18 1145 06/25/18 1231   06/25/18 1800  ceFEPIme (MAXIPIME) 1 g in sodium chloride 0.9 % 100 mL IVPB     1 g 200 mL/hr over 30 Minutes Intravenous Every 12 hours 06/25/18 1231     06/25/18 0900  aztreonam (AZACTAM) 2 g in sodium chloride 0.9 % 100 mL IVPB     2 g 200 mL/hr over 30 Minutes Intravenous  Once 06/25/18 0735 06/25/18 1226  06/25/18 0900  vancomycin (VANCOCIN) IVPB 1000 mg/200 mL premix     1,000 mg 200 mL/hr over 60 Minutes Intravenous  Once 06/25/18 0810  06/25/18 1143   06/23/18 2300  cefTRIAXone (ROCEPHIN) 1 g in sodium chloride 0.9 % 100 mL IVPB  Status:  Discontinued     1 g 200 mL/hr over 30 Minutes Intravenous Every 24 hours 06/23/18 2210 06/25/18 0735      HPI/Subjective: Looks chronically ill. No acute distress   Objective: Vitals:   06/29/18 0018 06/29/18 0412  BP: (!) 71/23 (!) 76/25  Pulse: 78 79  Resp:  12  Temp:  (!) 97.5 F (36.4 C)  SpO2:  98%    Intake/Output Summary (Last 24 hours) at 06/29/2018 1029 Last data filed at 06/29/2018 0700 Gross per 24 hour  Intake 934.97 ml  Output -  Net 934.97 ml   Filed Weights   06/27/18 0657 06/28/18 0600 06/29/18 0530  Weight: 47.2 kg 46.3 kg 47.1 kg    Exam:   General:  No distress, comfortable   Cardiovascular: s1,s2 rrr  Respiratory: no wheezing   Abdomen: soft, nt   Musculoskeletal: mild edema    Data Reviewed: Basic Metabolic Panel: Recent Labs  Lab 06/23/18 0619 06/24/18 0558 06/25/18 2307 06/26/18 0644 06/27/18 1145  NA 144 146* 145 146* 148*  K 4.1 4.2 3.3* 3.3* 3.6  CL 108 111 114* 112* 116*  CO2 29 28 25 25 25   GLUCOSE 176* 157* 167* 174* 85  BUN 42* 45* 47* 49* 50*  CREATININE 0.60 0.62 0.53 0.64 0.70  CALCIUM 8.3* 8.2* 8.0* 8.3* 8.3*  MG  --  1.8  --   --   --    Liver Function Tests: Recent Labs  Lab 06/24/18 0558 06/26/18 0644 06/27/18 1145  AST 44* 49* 71*  ALT 40 50* 62*  ALKPHOS 81 77 76  BILITOT 0.8 0.7 0.7  PROT 5.2* 4.6* 4.2*  ALBUMIN 1.9* 1.7* 1.5*   No results for input(s): LIPASE, AMYLASE in the last 168 hours. No results for input(s): AMMONIA in the last 168 hours. CBC: Recent Labs  Lab 06/23/18 0619 06/24/18 0558 06/25/18 2307 06/26/18 0644 06/27/18 1145  WBC 10.3 12.4* 7.2 7.5 6.4  NEUTROABS 9.5*  --   --   --   --   HGB 9.8* 10.5* 9.7* 9.9* 9.7*  HCT 32.2* 34.6* 32.4* 32.7* 31.3*  MCV 105.9* 103.3* 105.2* 107.6* 100.6*  PLT 97* 90* 65* 69* 46*   Cardiac Enzymes: No results for input(s):  CKTOTAL, CKMB, CKMBINDEX, TROPONINI in the last 168 hours. BNP (last 3 results) No results for input(s): BNP in the last 8760 hours.  ProBNP (last 3 results) No results for input(s): PROBNP in the last 8760 hours.  CBG: Recent Labs  Lab 06/28/18 1223 06/28/18 1640 06/28/18 1809 06/28/18 1940 06/29/18 0738  GLUCAP 71 65* 136* 119* 138*    Recent Results (from the past 240 hour(s))  Urine Culture     Status: Abnormal   Collection Time: 06/20/18  9:44 AM  Result Value Ref Range Status   Specimen Description   Final    URINE, CLEAN CATCH Performed at [facility], [place] [address] [place], [place] [address]    Special Requests   Final    NONE Performed at [facility], [place] [address] [place], [place] [address]    Culture MULTIPLE SPECIES PRESENT, SUGGEST RECOLLECTION (A)  Final   Report Status 06/24/2018 FINAL  Final  Culture, blood (routine x 2)     Status: None   Collection Time: 06/20/18  2:54 PM  Result Value Ref Range Status   Specimen Description   Final    BLOOD LEFT HAND Performed at [place] [address] [place], [place] [address]    Special Requests   Final    BOTTLES DRAWN AEROBIC ONLY Blood Culture adequate volume Performed at [place] [address] [place], [place] [address]    Culture   Final    NO GROWTH 5 DAYS Performed at [facility], [place] [address], [place], [place] [address]    Report Status 06/25/2018 FINAL  Final  Culture, blood (routine x 2)     Status: Abnormal   Collection Time: 06/20/18  2:54 PM  Result Value Ref Range Status   Specimen Description   Final    BLOOD RIGHT HAND Performed at [place] [address], [place], [place] [address]    Special Requests   Final    BOTTLES DRAWN AEROBIC ONLY Blood Culture adequate volume Performed at [facility] [address] [place], [place] [address]     Culture  Setup Time   Final    GRAM POSITIVE COCCI AEROBIC BOTTLE ONLY CRITICAL RESULT CALLED TO, READ BACK BY AND VERIFIED WITH: L.PONDEXTER,PHARMD 2359 06/21/18 M.CAMPBELL    Culture (A)  Final    STAPHYLOCOCCUS SPECIES (COAGULASE NEGATIVE) THE SIGNIFICANCE OF ISOLATING THIS ORGANISM FROM A SINGLE SET OF BLOOD CULTURES WHEN MULTIPLE SETS ARE DRAWN IS UNCERTAIN. PLEASE NOTIFY THE MICROBIOLOGY DEPARTMENT WITHIN ONE WEEK IF SPECIATION AND SENSITIVITIES ARE REQUIRED. Performed at [facility], [place] [address] [place], [place] [address]    Report Status 06/23/2018 FINAL  Final  Blood Culture ID Panel (Reflexed)     Status: Abnormal   Collection Time: 06/20/18  2:54 PM  Result Value Ref Range Status   Enterococcus species NOT DETECTED NOT DETECTED Final   Listeria monocytogenes NOT DETECTED NOT DETECTED Final   Staphylococcus species DETECTED (A) NOT DETECTED Final    Comment: Methicillin (oxacillin) resistant coagulase negative staphylococcus. Possible blood culture contaminant (unless isolated from more than one blood culture draw or clinical case suggests pathogenicity). No antibiotic treatment is indicated for blood  culture contaminants. CRITICAL RESULT CALLED TO, READ BACK BY AND VERIFIED WITH: L.PONDEXTER,PHARMD 2359 06/21/18 M.CAMPBELL    Staphylococcus aureus (BCID) NOT DETECTED NOT DETECTED Final   Methicillin resistance DETECTED (A) NOT DETECTED Final    Comment: CRITICAL RESULT CALLED TO, READ BACK BY AND VERIFIED WITH: L.PONDEXTER,PHARMD 2359 06/21/18 M.CAMPBELL    Streptococcus species NOT DETECTED NOT DETECTED Final   Streptococcus agalactiae NOT DETECTED NOT DETECTED Final   Streptococcus pneumoniae NOT DETECTED NOT DETECTED Final   Streptococcus pyogenes NOT DETECTED NOT DETECTED Final   Acinetobacter baumannii NOT DETECTED NOT DETECTED Final   Enterobacteriaceae species NOT DETECTED NOT DETECTED Final   Enterobacter cloacae complex NOT DETECTED NOT DETECTED Final    Escherichia coli NOT DETECTED NOT DETECTED Final   Klebsiella oxytoca NOT DETECTED NOT DETECTED Final   Klebsiella pneumoniae NOT DETECTED NOT DETECTED Final   Proteus species NOT DETECTED NOT DETECTED Final   Serratia marcescens NOT DETECTED NOT DETECTED Final   Haemophilus influenzae NOT DETECTED NOT DETECTED Final   Neisseria meningitidis NOT DETECTED NOT DETECTED Final   Pseudomonas aeruginosa NOT DETECTED NOT DETECTED Final   Candida albicans NOT DETECTED NOT DETECTED Final   Candida  glabrata NOT DETECTED NOT DETECTED Final   Candida krusei NOT DETECTED NOT DETECTED Final   Candida parapsilosis NOT DETECTED NOT DETECTED Final   Candida tropicalis NOT DETECTED NOT DETECTED Final    Comment: Performed at [facility], [place] [address] [place], [place] [address]  Culture, blood (routine x 2)     Status: None   Collection Time: 06/22/18  9:29 AM  Result Value Ref Range Status   Specimen Description   Final    BLOOD LEFT HAND Performed at [place] [address], [place], [place] [address]    Special Requests   Final    BOTTLES DRAWN AEROBIC AND ANAEROBIC Blood Culture adequate volume Performed at [place] [address], [place], [place] [address]    Culture   Final    NO GROWTH 5 DAYS Performed at [facility], [place] [address], [place], [place] [address]    Report Status 06/27/2018 FINAL  Final  Culture, blood (routine x 2)     Status: None   Collection Time: 06/22/18  9:29 AM  Result Value Ref Range Status   Specimen Description   Final    BLOOD RIGHT ARM Performed at [place] [address], [place], [place] [address]    Special Requests   Final    BOTTLES DRAWN AEROBIC ONLY Blood Culture results may not be optimal due to an inadequate volume of blood received in culture bottles Performed at [place] [address] [place], [place] [address]    Culture   Final     NO GROWTH 5 DAYS Performed at [facility], [place] [address] [place], [place] [address]    Report Status 06/27/2018 FINAL  Final  MRSA PCR Screening     Status: Abnormal   Collection Time: 06/23/18  9:11 AM  Result Value Ref Range Status   MRSA by PCR POSITIVE (A) NEGATIVE Final    Comment:        The GeneXpert MRSA Assay (FDA approved for NASAL specimens only), is one component of a comprehensive MRSA colonization surveillance program. It is not intended to diagnose MRSA infection nor to guide or monitor treatment for MRSA infections. RESULT CALLED TO, READ BACK BY AND VERIFIED WITH: FRASER,B @ 1158 ON 121019 BY POTEAT,S Performed at [place] [address] [place], [place] [address]   Culture, Urine     Status: Abnormal   Collection Time: 06/23/18  9:44 AM  Result Value Ref Range Status   Specimen Description   Final    URINE, CLEAN CATCH Performed at [facility], [place] [address] [place], [place] [address]    Special Requests   Final    NONE Performed at [facility], [place] [address] [place], [place] [address]    Culture MULTIPLE SPECIES PRESENT, SUGGEST RECOLLECTION (A)  Final   Report Status 06/25/2018 FINAL  Final  Culture, blood (routine x 2)     Status: None (Preliminary result)   Collection Time: 06/25/18 11:07 PM  Result Value Ref Range Status   Specimen Description   Final    BLOOD RIGHT HAND Performed at [facility], [place] [address] [place], [place] [address]    Special Requests   Final    AEROBIC BOTTLE ONLY Blood Culture results may not be optimal due to an inadequate volume of blood received in culture bottles Performed at [facility], [place] [address], [place], [place] [address]    Culture   Final    NO GROWTH 3 DAYS Performed at [facility], [place] [address], [place], [place] [address]    Report Status PENDING  Incomplete  Culture, blood (routine x 2)      Status: None (Preliminary result)   Collection Time: 06/25/18 11:07 PM  Result Value Ref Range Status   Specimen Description   Final    BLOOD LEFT HAND Performed at [facility], [place] [address], [place], [place] [address]    Special Requests   Final    AEROBIC BOTTLE ONLY Blood Culture results may not be optimal due to an inadequate volume of blood received in culture bottles Performed at [place] [address], [place], [place] [address]    Culture   Final    NO GROWTH 3 DAYS Performed at [facility], [place] [address], [place], [place] [address]    Report Status PENDING  Incomplete     Studies: No results found.  Scheduled Meds: . sodium chloride   Intravenous Once  . carvedilol  6.25 mg Oral BID  . Chlorhexidine Gluconate Cloth  6 each Topical Q0600  . ferrous sulfate  325 mg Oral BID WC  . folic acid  2 mg Oral Daily  . insulin aspart  0-9 Units Subcutaneous TID WC  . levETIRAcetam  500 mg Oral BID  . levothyroxine  100 mcg Oral Q0600  . magnesium oxide  400 mg Oral Daily  . mupirocin ointment  1 application Nasal BID  . pantoprazole  40 mg Oral Daily  . potassium chloride SA  20 mEq Oral Daily  . sertraline  50 mg Oral Daily  . sodium phosphate  1 enema Rectal Once   Continuous Infusions: . ceFEPime (MAXIPIME) IV 1 g (06/29/18 0955)  . dextrose 5 % and 0.45% NaCl 75 mL/hr at 06/29/18 5329    Active Problems:   Tonic-clonic seizure disorder ([place])   Hypothyroidism   Type 2 diabetes mellitus with peripheral vascular disease ([place])   PVD (peripheral vascular disease) ([place])   Alzheimer's dementia ([place])   Symptomatic anemia   Severe protein-calorie malnutrition ([place])   Adult failure to thrive   Chronic systolic CHF (congestive heart failure) ([place])   Sacral decubitus ulcer   Anemia   Goals of care, counseling/discussion    Time spent: >35 minutes     Kinnie Feil  [facility] Pager [phone]. If 7PM-7AM,  please contact night-coverage at [email or web address], password [facility] 06/29/2018, 10:29 AM  LOS: 7 days

## 2018-06-29 NOTE — Progress Notes (Signed)
[facility])  Patient has a bed at [facility].  SW with [facility] is completing necessary registration paperwork with the family as the family is out of town and unable to meet to complete in person.  Will notify [place] LCSW once transport can be arranged.  Please send discharge summary to [phone]  RN staff:  Please call report to [phone]  **The AICD needs to be deactivated prior to transport being called**  Please call with any hospice related questions  Venia Carbon BSN, RN [facility] Liaison (listed in [place]) [phone]

## 2018-07-01 LAB — CULTURE, BLOOD (ROUTINE X 2)
Culture: NO GROWTH
Culture: NO GROWTH

## 2018-07-15 DEATH — deceased

## 2019-02-05 IMAGING — CT CT CHEST W/ CM
2 of 5 series · 13 of 46 positions shown, 15 images · IV contrast (ISOVUE)
Comparison: CT abdomen dated 11/11/2017.

CLINICAL DATA: Pleural effusion. Low hemoglobin. Pain with
movement.

EXAM:
CT CHEST, ABDOMEN, AND PELVIS WITH CONTRAST
TECHNIQUE: Multidetector CT imaging of the chest, abdomen and pelvis was
performed following the standard protocol during bolus
administration of intravenous contrast.
CONTRAST:  80mL [id number] IOPAMIDOL (GGQ79U-PHH) INJECTION 61%

[Series 3: axial st · axial · 0.90mm/px · z∈[-632,-127]mm · 10 of 125 slices shown, 12 images]
[im 12/125  soft-tissue]
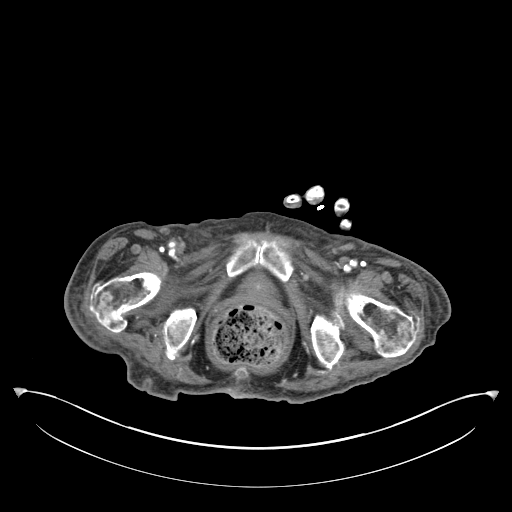
[im 12/125  bone]
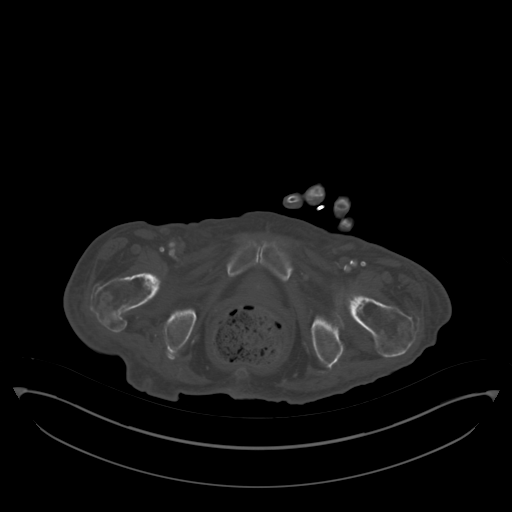
[im 23/125  soft-tissue]
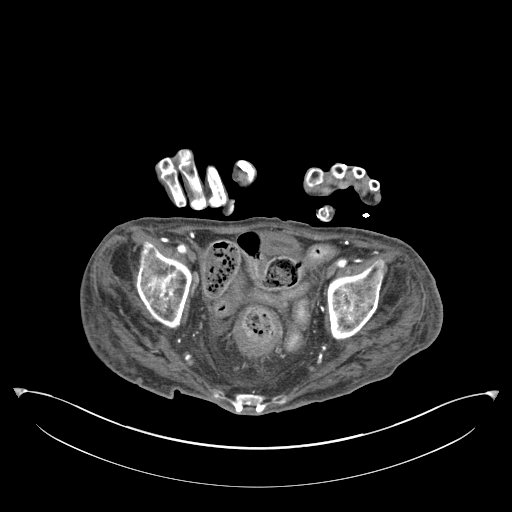
[im 34/125  soft-tissue]
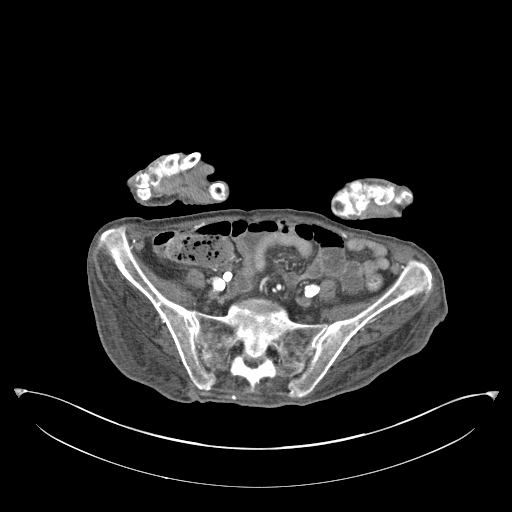
[im 46/125  soft-tissue]
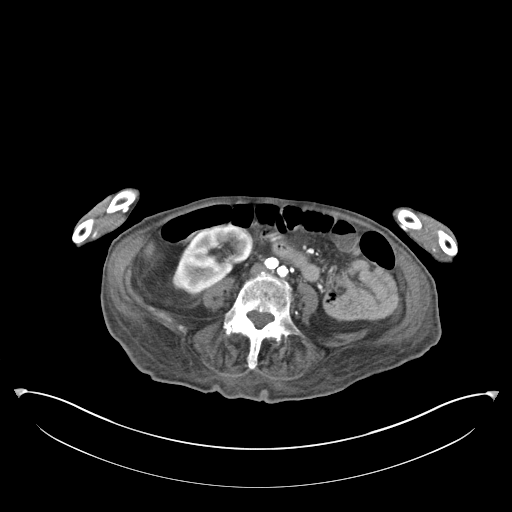
[im 57/125  soft-tissue]
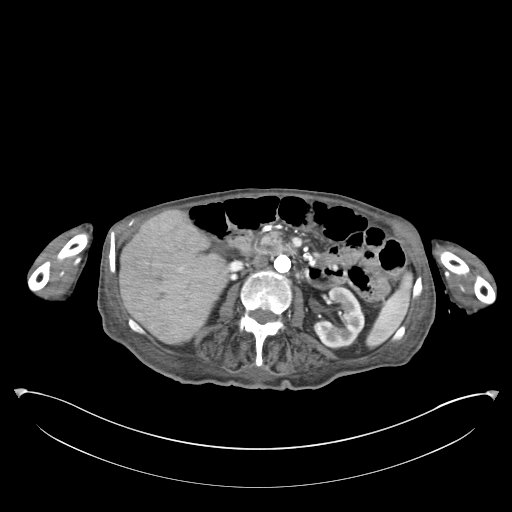
[im 68/125  soft-tissue]
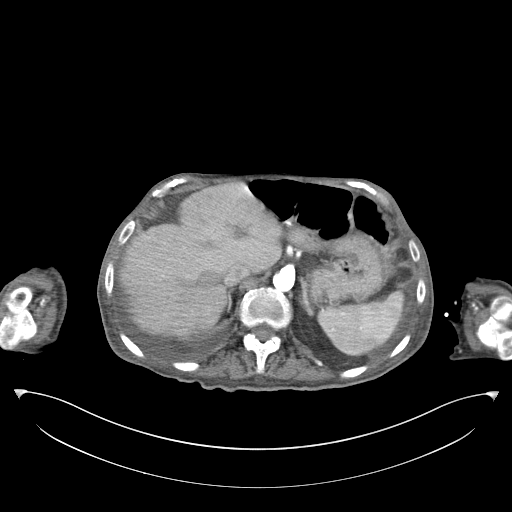
[im 79/125  soft-tissue]
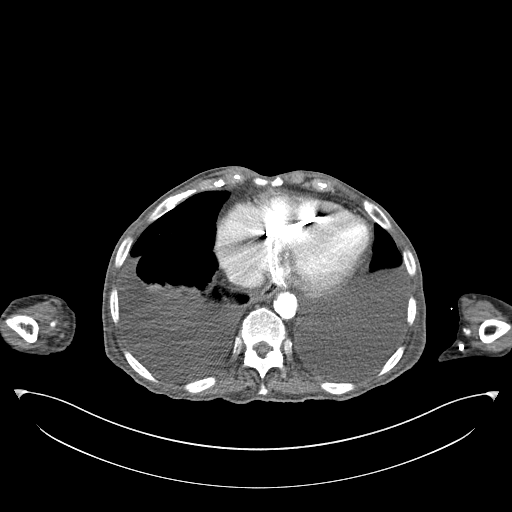
[im 91/125  soft-tissue]
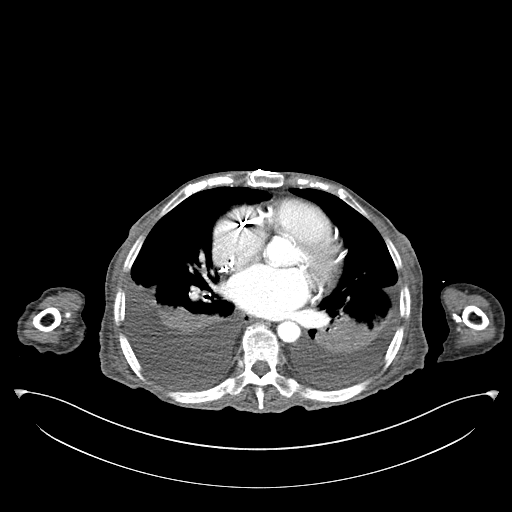
[im 102/125  soft-tissue]
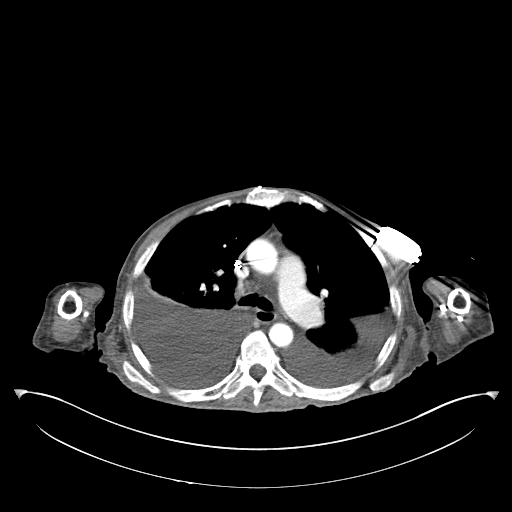
[im 102/125  bone]
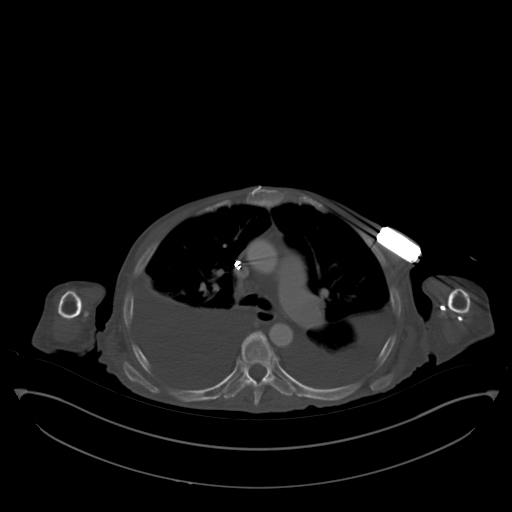
[im 113/125  soft-tissue]
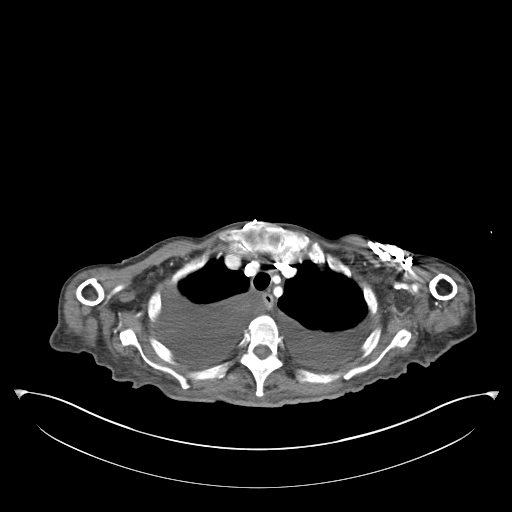

[Series 6: coronal st · coronal · 0.68mm/px · 3 of 80 slices shown]
[im 27/80  soft-tissue]
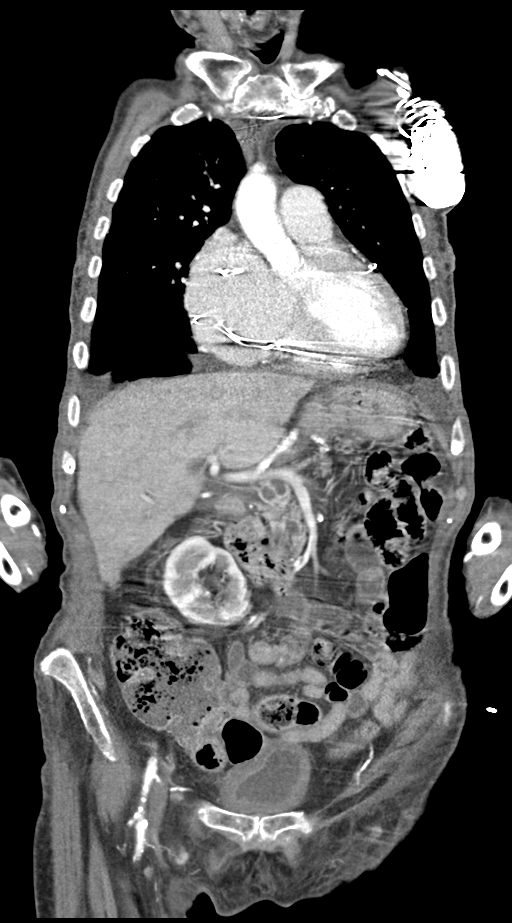
[im 36/80  soft-tissue]
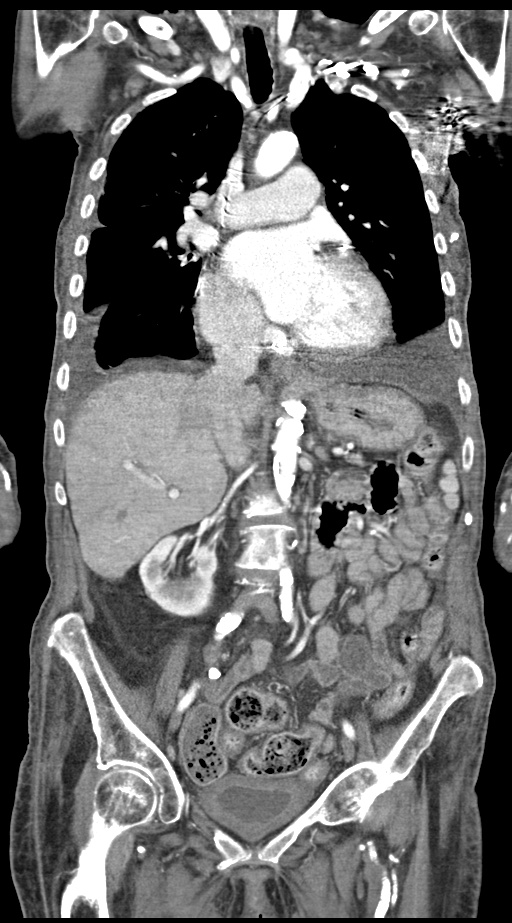
[im 44/80  soft-tissue]
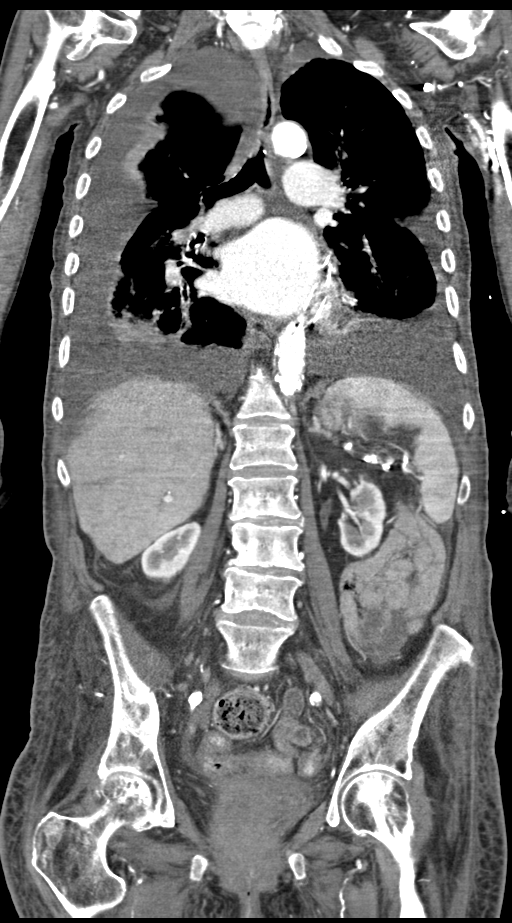

[13 of 46 positions shown; findings below may reference images not displayed]

FINDINGS: CT CHEST FINDINGS

Cardiovascular: Cardiomegaly. No pericardial effusion. No thoracic
aortic aneurysm or evidence of aortic dissection. Scattered aortic
atherosclerosis.

Prominent main pulmonary artery indicating chronic pulmonary artery
hypertension.

Mediastinum/Nodes: No mass or lymphadenopathy is appreciated within
the mediastinum or perihilar regions. Esophagus is unremarkable.
Trachea and central bronchi are unremarkable.

Lungs/Pleura: Moderate to large pleural effusions bilaterally, RIGHT
slightly greater than LEFT. Associated atelectasis bilaterally. No
pneumothorax.

Musculoskeletal: No acute or suspicious osseous finding. Median
sternotomy wires in place. Degenerative changes noted at both
shoulders, moderate to severe in degree. Mild degenerative spurring
within the thoracic spine.

CT ABDOMEN PELVIS FINDINGS

Hepatobiliary: Liver contours are slightly nodular throughout
suggesting cirrhosis. No focal liver abnormality is seen. Presumed
cholecystectomy. No bile duct dilatation or bile duct stone seen.

Pancreas: Unremarkable.

Spleen: Normal in size without focal abnormality.

Adrenals/Urinary Tract: Adrenal glands appear normal. LEFT renal
cyst. No suspicious mass, stone or hydronephrosis bilaterally.
Thickening of the walls of the bladder circumferentially.

Stomach/Bowel: Rectal vault is moderately distended with stool.
There is at least mild thickening of the walls of the rectosigmoid
colon.. No dilated large or small bowel loops. No additional site of
bowel wall thickening or evidence of bowel wall inflammation.
Appendix is not convincingly seen.

Vascular/Lymphatic: Marked aortic atherosclerosis. Additional
atherosclerosis at the origins of each of the aortic branch vessels.
No acute appearing vascular abnormality.

Reproductive: Presumed hysterectomy.  No adnexal mass seen.

Other: Small amount of free fluid in the pelvis. No abscess
collection identified. No free intraperitoneal air.

Musculoskeletal: No acute or suspicious osseous finding. Sclerotic
changes of the femoral heads bilaterally suggesting chronic AVN.
Degenerative spondylosis of the lumbar spine, mild to moderate in
degree.

Mild ill-defined edema within the subcutaneous soft tissues of the
abdomen and pelvis suggesting anasarca.
IMPRESSION: 1. Moderate to large pleural effusions bilaterally, RIGHT slightly
greater than LEFT, with associated atelectasis bilaterally.
2. Cardiomegaly.  No evidence of associated pulmonary edema.
3. Thickening of the bladder walls circumferentially, suspicious for
cystitis. Consider correlation with urinalysis.
4. Rectal vault is moderately distended with stool. Differential
considerations include stercoral colitis, localized colitis of
infectious or inflammatory nature, and reactive thickening secondary
to the adjacent suspected cystitis.
5. Aortic atherosclerosis.
6. Probable anasarca.
7. Additional chronic/incidental findings detailed above.
